# Patient Record
Sex: Female | Born: 1935 | Race: White | Hispanic: No | Marital: Married | Smoking: Former smoker
Health system: Southern US, Community
[De-identification: ages and names within clinical notes are randomized; demographics above are authoritative.]

## PROBLEM LIST (undated history)

## (undated) DIAGNOSIS — U071 COVID-19: Secondary | ICD-10-CM

## (undated) DIAGNOSIS — N189 Chronic kidney disease, unspecified: Secondary | ICD-10-CM

## (undated) DIAGNOSIS — J189 Pneumonia, unspecified organism: Secondary | ICD-10-CM

## (undated) DIAGNOSIS — M1990 Unspecified osteoarthritis, unspecified site: Secondary | ICD-10-CM

## (undated) DIAGNOSIS — T8859XA Other complications of anesthesia, initial encounter: Secondary | ICD-10-CM

## (undated) DIAGNOSIS — C4492 Squamous cell carcinoma of skin, unspecified: Secondary | ICD-10-CM

## (undated) DIAGNOSIS — H35039 Hypertensive retinopathy, unspecified eye: Secondary | ICD-10-CM

## (undated) DIAGNOSIS — C4491 Basal cell carcinoma of skin, unspecified: Secondary | ICD-10-CM

## (undated) DIAGNOSIS — R0989 Other specified symptoms and signs involving the circulatory and respiratory systems: Secondary | ICD-10-CM

## (undated) DIAGNOSIS — E785 Hyperlipidemia, unspecified: Secondary | ICD-10-CM

## (undated) DIAGNOSIS — N183 Chronic kidney disease, stage 3 unspecified: Secondary | ICD-10-CM

## (undated) DIAGNOSIS — I129 Hypertensive chronic kidney disease with stage 1 through stage 4 chronic kidney disease, or unspecified chronic kidney disease: Secondary | ICD-10-CM

## (undated) DIAGNOSIS — D649 Anemia, unspecified: Secondary | ICD-10-CM

## (undated) DIAGNOSIS — E119 Type 2 diabetes mellitus without complications: Secondary | ICD-10-CM

## (undated) DIAGNOSIS — H3530 Unspecified macular degeneration: Secondary | ICD-10-CM

## (undated) DIAGNOSIS — I10 Essential (primary) hypertension: Secondary | ICD-10-CM

## (undated) DIAGNOSIS — E1122 Type 2 diabetes mellitus with diabetic chronic kidney disease: Secondary | ICD-10-CM

## (undated) DIAGNOSIS — E1140 Type 2 diabetes mellitus with diabetic neuropathy, unspecified: Secondary | ICD-10-CM

## (undated) HISTORY — PX: CHOLECYSTECTOMY: SHX55

## (undated) HISTORY — DX: Type 2 diabetes mellitus without complications: E11.9

## (undated) HISTORY — PX: SPINE SURGERY: SHX786

## (undated) HISTORY — DX: Unspecified macular degeneration: H35.30

## (undated) HISTORY — PX: ABDOMINAL HYSTERECTOMY: SHX81

## (undated) HISTORY — DX: Essential (primary) hypertension: I10

## (undated) HISTORY — DX: Hypertensive retinopathy, unspecified eye: H35.039

## (undated) HISTORY — PX: TONSILLECTOMY: SUR1361

## (undated) HISTORY — PX: CATARACT EXTRACTION: SUR2

## (undated) HISTORY — PX: HERNIA REPAIR: SHX51

## (undated) HISTORY — PX: EYE SURGERY: SHX253

---
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
## 1998-05-07 ENCOUNTER — Ambulatory Visit (HOSPITAL_COMMUNITY): Admission: RE | Admit: 1998-05-07 | Discharge: 1998-05-07 | Payer: Self-pay | Admitting: *Deleted

## 1998-12-12 ENCOUNTER — Encounter: Admission: RE | Admit: 1998-12-12 | Discharge: 1998-12-12 | Payer: Self-pay | Admitting: *Deleted

## 2000-09-01 ENCOUNTER — Encounter: Admission: RE | Admit: 2000-09-01 | Discharge: 2000-09-01 | Payer: Self-pay | Admitting: *Deleted

## 2000-10-11 ENCOUNTER — Encounter (INDEPENDENT_AMBULATORY_CARE_PROVIDER_SITE_OTHER): Payer: Self-pay | Admitting: Specialist

## 2000-10-11 ENCOUNTER — Ambulatory Visit (HOSPITAL_COMMUNITY): Admission: RE | Admit: 2000-10-11 | Discharge: 2000-10-11 | Payer: Self-pay | Admitting: *Deleted

## 2000-12-13 ENCOUNTER — Ambulatory Visit (HOSPITAL_COMMUNITY): Admission: RE | Admit: 2000-12-13 | Discharge: 2000-12-13 | Payer: Self-pay | Admitting: *Deleted

## 2001-11-28 ENCOUNTER — Encounter: Admission: RE | Admit: 2001-11-28 | Discharge: 2001-11-28 | Payer: Self-pay | Admitting: Internal Medicine

## 2001-11-28 ENCOUNTER — Encounter: Payer: Self-pay | Admitting: Internal Medicine

## 2002-03-28 ENCOUNTER — Encounter (INDEPENDENT_AMBULATORY_CARE_PROVIDER_SITE_OTHER): Payer: Self-pay | Admitting: *Deleted

## 2002-03-28 ENCOUNTER — Ambulatory Visit (HOSPITAL_COMMUNITY): Admission: RE | Admit: 2002-03-28 | Discharge: 2002-03-28 | Payer: Self-pay | Admitting: *Deleted

## 2002-12-31 ENCOUNTER — Encounter: Admission: RE | Admit: 2002-12-31 | Discharge: 2002-12-31 | Payer: Self-pay | Admitting: Internal Medicine

## 2003-02-07 ENCOUNTER — Ambulatory Visit (HOSPITAL_COMMUNITY): Admission: RE | Admit: 2003-02-07 | Discharge: 2003-02-07 | Payer: Self-pay | Admitting: *Deleted

## 2003-02-28 ENCOUNTER — Inpatient Hospital Stay (HOSPITAL_COMMUNITY): Admission: EM | Admit: 2003-02-28 | Discharge: 2003-03-06 | Payer: Self-pay | Admitting: Emergency Medicine

## 2003-03-01 ENCOUNTER — Encounter (INDEPENDENT_AMBULATORY_CARE_PROVIDER_SITE_OTHER): Payer: Self-pay | Admitting: *Deleted

## 2004-02-06 ENCOUNTER — Encounter: Admission: RE | Admit: 2004-02-06 | Discharge: 2004-02-06 | Payer: Self-pay | Admitting: Internal Medicine

## 2004-08-27 ENCOUNTER — Ambulatory Visit: Payer: Self-pay | Admitting: Hematology and Oncology

## 2004-10-29 ENCOUNTER — Ambulatory Visit: Payer: Self-pay | Admitting: Hematology and Oncology

## 2004-12-15 ENCOUNTER — Ambulatory Visit: Payer: Self-pay | Admitting: Hematology and Oncology

## 2005-01-29 ENCOUNTER — Ambulatory Visit: Payer: Self-pay | Admitting: Hematology and Oncology

## 2005-03-18 ENCOUNTER — Ambulatory Visit: Payer: Self-pay | Admitting: Hematology and Oncology

## 2005-03-23 ENCOUNTER — Encounter (HOSPITAL_COMMUNITY): Admission: RE | Admit: 2005-03-23 | Discharge: 2005-06-21 | Payer: Self-pay | Admitting: Internal Medicine

## 2005-04-21 ENCOUNTER — Encounter (INDEPENDENT_AMBULATORY_CARE_PROVIDER_SITE_OTHER): Payer: Self-pay | Admitting: *Deleted

## 2005-04-21 ENCOUNTER — Ambulatory Visit (HOSPITAL_COMMUNITY): Admission: RE | Admit: 2005-04-21 | Discharge: 2005-04-22 | Payer: Self-pay | Admitting: General Surgery

## 2005-05-10 ENCOUNTER — Encounter: Admission: RE | Admit: 2005-05-10 | Discharge: 2005-05-10 | Payer: Self-pay | Admitting: Internal Medicine

## 2005-05-18 ENCOUNTER — Ambulatory Visit: Payer: Self-pay | Admitting: Hematology and Oncology

## 2006-09-20 ENCOUNTER — Encounter: Admission: RE | Admit: 2006-09-20 | Discharge: 2006-09-20 | Payer: Self-pay | Admitting: Obstetrics and Gynecology

## 2007-12-19 ENCOUNTER — Encounter: Admission: RE | Admit: 2007-12-19 | Discharge: 2007-12-19 | Payer: Self-pay | Admitting: Internal Medicine

## 2009-04-11 ENCOUNTER — Encounter: Admission: RE | Admit: 2009-04-11 | Discharge: 2009-04-11 | Payer: Self-pay | Admitting: Internal Medicine

## 2009-06-02 ENCOUNTER — Ambulatory Visit (HOSPITAL_COMMUNITY): Admission: RE | Admit: 2009-06-02 | Discharge: 2009-06-03 | Payer: Self-pay | Admitting: General Surgery

## 2010-04-14 LAB — CBC
HCT: 33.9 % — ABNORMAL LOW (ref 36.0–46.0)
Hemoglobin: 11.5 g/dL — ABNORMAL LOW (ref 12.0–15.0)
MCV: 91.3 fL (ref 78.0–100.0)
Platelets: 276 10*3/uL (ref 150–400)
RBC: 3.71 MIL/uL — ABNORMAL LOW (ref 3.87–5.11)
RDW: 15.1 % (ref 11.5–15.5)
WBC: 6.3 10*3/uL (ref 4.0–10.5)

## 2010-04-14 LAB — BASIC METABOLIC PANEL
BUN: 15 mg/dL (ref 6–23)
CO2: 28 mEq/L (ref 19–32)
Calcium: 9.4 mg/dL (ref 8.4–10.5)
Chloride: 98 mEq/L (ref 96–112)

## 2010-04-14 LAB — GLUCOSE, CAPILLARY
Glucose-Capillary: 102 mg/dL — ABNORMAL HIGH (ref 70–99)
Glucose-Capillary: 117 mg/dL — ABNORMAL HIGH (ref 70–99)
Glucose-Capillary: 119 mg/dL — ABNORMAL HIGH (ref 70–99)
Glucose-Capillary: 140 mg/dL — ABNORMAL HIGH (ref 70–99)

## 2010-06-12 NOTE — Discharge Summary (Signed)
Sue [name], Sue [name]               ACCOUNT NO.:  [id number]   MEDICAL RECORD NO.:  [id number]          PATIENT TYPE:  OIB   LOCATION:  5733                         FACILITY:  [facility]   PHYSICIAN:  Gabrielle Dare. Janee Morn, M.D.DATE OF BIRTH:  12/25/1932   DATE OF ADMISSION:  04/21/2005  DATE OF DISCHARGE:  04/22/2005                                 DISCHARGE SUMMARY   DISCHARGE DIAGNOSES:  1.  Ventral incisional hernia.  2.  Status post ventral incisional hernia repair.   HISTORY OF PRESENT ILLNESS:  The patient is a 77 year old female who  developed ventral incisional hernia after doing some heavy lifting after a  laparoscopic cholecystectomy.  The hernia enlarged and became more  symptomatic.  She presented for elective repair.   HOSPITAL COURSE:  The patient underwent an uncomplicated repair of a ventral  incisional hernia.  No mesh was required.  Postoperatively, she remained  afebrile and hemodynamically stable.  Diabetes was well managed.  She  tolerated gradual advancement of her diet and is discharged home  postoperative day one.   DISCHARGE MEDICATIONS:  She is to continue her home medications.  In  addition, Percocet 5/325 1-2 p.o. q6h. p.r.n. pain and follow up is next  Thursday as a nurse visit at my office for removal of her staples.  Her  activity is no lifting.      Gabrielle Dare Janee Morn, M.D.  Electronically Signed     BET/MEDQ  D:  04/22/2005  T:  04/22/2005  Job:  [id number]

## 2010-06-12 NOTE — Consult Note (Signed)
NAME:  Sue [name], Sue [name] NO.:  [id number]   MEDICAL RECORD NO.:  [id number]                   PATIENT TYPE:  INP   LOCATION:  2908                                 FACILITY:  [facility]   PHYSICIAN:  Gabrielle Dare. Janee Morn, M.D.             DATE OF BIRTH:  1932/07/15   DATE OF CONSULTATION:  03/01/2003  DATE OF DISCHARGE:                                   CONSULTATION   REASON FOR CONSULTATION:  Acute cholecystitis.   HISTORY OF PRESENT ILLNESS:  The patient is a 77 year old white female with  a history of hypertension and non-insulin dependent diabetes mellitus who is  a patient of Dr. Juleen [name]. She complains of a three to four day history of  abdominal pain. I am asked to see her to evaluate this by Dr. Susy [name].  The patient had some nausea and vomiting on Monday night, but has had none  since. She has had some persistent pain in her right upper quadrant. She saw  Dr. Juleen [name] who ordered an outpatient CT scan of the abdomen and pelvis. The  reportedly shows acute cholecystitis. She was going to be transferred her to  this facility. The patient is still having some pain in the right upper  quadrant, though she claims it is significantly less. She has no other  complaints at this time. She has not had a significant history of biliary  colic in the past.   PAST MEDICAL HISTORY:  Non-insulin dependent diabetes mellitus,  hypertension, and colon polyps.   PAST SURGICAL HISTORY:  Hysterectomy, tonsillectomy, and back surgery, which  was done by Dr. Trey [name]. She has had colonoscopy with polypectomy by Dr.  Virginia [name].   SOCIAL HISTORY:  She does not drink alcohol. She does not smoke.   MEDICATIONS:  1. Nortriptyline 25 mg p.o. b.i.d.  2. Aciphex 20 mg daily.  3. Aspirin 81 mg daily.  4. Vitamin B12.  5. Fish oil.  6. Lescol daily.  7. Avandamet 1000 mg p.o. b.i.d.  8. Glipizide 10 mg daily.  9. Enalapril 10 mg daily.  10.      Atenolol 25 mg daily.  11.       Hydrochlorothiazide 25 mg daily.   ALLERGIES:  No known drug allergies.   REVIEW OF SYSTEMS:  GENERAL:  She feels weak. CARDIAC:  Negative. PULMONARY:  Negative. GI:  See the history of present illness. GU:  Negative. GYN:  Status post hysterectomy, but she does have her ovaries. The remainder of  the review of systems is negative.   PHYSICAL EXAMINATION:  VITAL SIGNS:  Temperature 98.7, blood pressure  101/48, pulse rate 82, respirations 26, saturations 97% on room air.  GENERAL:  She is awake and alert.  HEENT:  Pupils are equal and reactive. Sclerae is nonicteric.  NECK:  Supple. There are no thyroid masses. There is no cervical or  supraclavicular adenopathy.  LUNGS:  Clear to  auscultation with good respiratory excursion.  HEART:  Regular rate and rhythm. PMI is palpable in the left chest. Distal  pulses are 1-2+ and equal bilaterally.  ABDOMEN:  Minimally distended, soft, with tenderness to moderate deep  palpation in the right upper quadrant. No masses are palpable. Bowel sounds  are hypoactive.  SKIN:  Warm, dry, and intact.   LABORATORY DATA:  White blood cell count 15.4, hemoglobin 9.2, platelets  237, sodium 129, potassium 2.6, carbon dioxide 26, glucose 107. BUN 35,  creatinine 2. AST 14, ALP 13, alkaline phosphatase 65, bilirubin 0.8.  Urinalysis is unremarkable. CT scan is en route.   IMPRESSION:  1. Acute cholecystitis.  2. Hypovolemia.  3. Hyponatremia.   PLAN:  The patient was admitted to the medical service by Dr. Garnette [name]. IV  fluid resuscitation and IV antibiotics are recommended. We will recheck a  BMET in the a.m. and possible do a cholecystectomy in the morning if her  creatinine is down and she is medically cleared at that time. The procedure,  risks, and benefits were discussed in detail with the patient and her  daughter including the risks of converting to open procedure, bleeding,  infection, and common bile duct injury. Questions were answered and  they  express understanding. I feel the patient might not be ready medically to go  to surgery tomorrow and we discussed this as well. We will proceed with  getting her gallbladder out when it is safe to do so.                                               Gabrielle Dare Janee Morn, M.D.    BET/MEDQ  D:  03/01/2003  T:  03/01/2003  Job:  [id number]

## 2010-06-12 NOTE — Op Note (Signed)
NAME:  Sue [name], Sue [name] NO.:  [id number]   MEDICAL RECORD NO.:  [id number]                   PATIENT TYPE:  INP   LOCATION:  2908                                 FACILITY:  [facility]   PHYSICIAN:  Gabrielle Dare. Janee Morn, M.D.             DATE OF BIRTH:  12/10/1932   DATE OF PROCEDURE:  03/01/2003  DATE OF DISCHARGE:                                 OPERATIVE REPORT   PREOPERATIVE DIAGNOSIS:  Acute cholecystitis.   POSTOPERATIVE DIAGNOSIS:  Gangrenous cholecystitis with empyema of the  gallbladder.   PROCEDURE:  Laparoscopic cholecystectomy.   SURGEON:  Gabrielle Dare. Janee Morn, M.D.   ASSISTANT:  Vikki Ports, M.D.   ANESTHESIA:  General.   HISTORY OF PRESENT ILLNESS:  The patient is a 77 year old female with non  insulin dependent diabetes mellitus, who was admitted to Dr. _______ service  with a 3 to  4 day history of abdominal pain. She had a workup as an  outpatient per Dr. Juleen China which included a CT scan which showed acute  cholecystitis. On admission in the middle of the night last night, her white  blood cell count was elevated to 15.4, and her creatinine  was elevated to  2.0. She was admitted. Intravenous antibiotics and IV fluid resuscitation  were instituted with improvement in her laboratory  studies  and she  presents for cholecystectomy.   DESCRIPTION OF PROCEDURE:  Informed consent was obtained and the patient  remained on Unasyn intravenously. She was brought to the operating room and  general anesthesia was administered. Her abdomen  was prepped and draped in  a sterile fashion.   A supraumbical  midline incision was made. The subcutaneous tissues were  dissected down revealing  the anterior fascia which was divided sharply. The  peritoneal cavity was entered under direct vision without difficulty. A 0  Vicryl pursestring suture was placed around the fascial opening and the  Hasson trocar was inserted in the abdomen. The abdomen was  insufflated with  CO2 in the usual standard fashion.   Under direct vision, an 11-mm epigastric  and two 5-mm lateral ports were  placed. Exploration revealed  dense adhesions up to the gallbladder, and an  extremely inflamed gallbladder  with patchy necrosis on the anterior wall. A  Navjot suction irrigator was used to aspirate first to facilitate grasping.  Aspiration revealed some clear  bile mixed with purulent fluid consistent  with an empyema.   Once the aspiration was completed, the dome of the gallbladder was retracted  superomedially. Blunt dissection  was used to clear the transverse colon and  omentum  which was quite adherent away. This was undertaken gradually, then  it slowly revealed the  infundibulum, followed  by  the cystic duct.   We began our dissection laterally and progressed medially, revealing  the  cystic duct. We continued the dissection along each edge of the gallbladder  to facilitate  a large window between  the infundibulum, the cystic duct and  the liver. Once that was accomplished with good visualization, a clip was  placed on the infundibulo cystic duct junction. Please note that the  gallbladder  was incredibly friable with several necrotic areas and was  literally falling apart.   We made a small  nick in the cystic duct and attempted to insert a Reddick  cholangiogram  catheter. The cystic duct was too friable  and it was not  possible to admit the cholangiogram  catheter. The patient's liver function  tests had been OK, so after several attempts, a cholangiogram  was  abandoned, as it was not technically feasible.   Subsequently our window was rechecked and 3 clips were placed proximally  on  the cystic duct. Subsequently further  dissection revealed the cystic artery  which was clipped 2 times proximally, once distally and divided. The  gallbladder  was then gradually  taken off the liver bed using Bovie  cautery. It remained  friable and  gangrenous, and several areas of the liver  bed were cauterized to get good hemostasis as we removed it. It was  subsequently placed in an EndoCatch bag and taken out of the abdomen via the  supraumbical  port site.   Subsequently copious irrigation was used. We reinspected the liver bed.  Several areas were cauterized to give good hemostasis. There was a good  amount of oozing throughout the case and clots were evacuated. A few more  areas were touched up with the Bovie on the liver bed until hemostasis was  achieved.   Once that was accomplished, then the irrigation fluid returned clear. A  piece of Surgicel was cut in half and both pieces were placed down in the  liver bed. Subsequently a 69 [place] Blake drain was inserted via the lateral  of the 2 lateral  port sites and placed in the gallbladder bed under the  gallbladder  fossa.   Subsequently the trocars were removed under direct vision. The  pneumoperitoneum was released. The Hasson trocar was taken out of the  abdomen. The fascial defect was closed by tying the 0 Vicryl pursestring  suture. All 4 wounds were copiously irrigated and 0.25% Marcaine with  epinephrine had been used at each site for postoperative pain relief.   The skin of each incision was closed with a running 4-0 Vicryl subcuticular  stitch. Benzoin and Steri-Strips and sterile dressings were applied. All  sponge, instrument and needle counts were correct.   The patient tolerated the procedure without apparent complications. She was  taken to the recovery room in stable condition.                                               Gabrielle Dare Janee Morn, M.D.    BET/MEDQ  D:  03/01/2003  T:  03/02/2003  Job:  [id number]

## 2010-06-12 NOTE — Discharge Summary (Signed)
NAME:  Sue [name], Sue [name]                         ACCOUNT NO.:  [id number]   MEDICAL RECORD NO.:  [id number]                   PATIENT TYPE:  INP   LOCATION:  4739                                 FACILITY:  [facility]   PHYSICIAN:  Ara D. Tammi Klippel, M.D.                DATE OF BIRTH:  Oct 25, 1932   DATE OF ADMISSION:  02/28/2003  DATE OF DISCHARGE:                                 DISCHARGE SUMMARY   PRIMARY CARE PHYSICIAN:  [name], M.D.   CONSULTING SURGEON:  Gabrielle Dare. Janee Morn, M.D.   FINAL DIAGNOSES:  1. Gangrenous cholecystitis with empyema of the gallbladder.  2. Diabetes mellitus type 2 with neuropathy.  3. Hypertension.  4. Obesity.  5. Sigmoid diverticulosis.  6. Gastroesophageal reflux disease.  7. Status post hysterectomy.   FINAL PROCEDURES:  1. Chest x-ray performed March 01, 2003 showing cardiac enlargement and     vascular congestion.  2. Chest x-ray performed March 03, 2003 showing decreased lung volumes and     worsening bibasilar atelectasis.  3. Laparoscopic cholecystectomy performed March 01, 2003.   PERTINENT LABORATORY DATA AND OTHER TEST RESULTS:  White blood cells 10.7,  H&H 8.0/23.5, with MCV of 85.1, platelet count of 349.  Sodium 138,  potassium 3.7, chloride 104, carbon dioxide 27, BUN 20, creatinine 1.0,  glucose 158, calcium 8.1, magnesium 1.7.  Hemoglobin A1c 7.8.  TSH 0.512.  Total iron was less than 10, vitamin B12 was 908, folate 585, ferritin 177,  TIBC and percent saturation were unable to be calculated.   SUMMARY OF HOSPITAL COURSE BY SYSTEMS:  The is a very pleasant 77 year old  white female with past medical history as listed above who was admitted for  emergent treatment of cholecystitis.   #1 - NEUROPSYCHIATRIC.  There were no signs or symptoms that may suggest  __________ or CVA.  There were no in-hospital episodes of acute mania or  psychosis.  The patient appeared euthymic throughout the admission.   #2 - PULMONARY.  The  patient was placed on supplemental oxygen in the  beginning with aggressive pulmonary toilet postoperatively.  There were no  episodes of hypoxemia, paroxysmal nocturnal dyspnea, or orthopnea.   #3 - CARDIOVASCULAR.  Initially the patient was hypotensive following her  surgery and required some moderate fluid support.  However, this soon  cleared up postoperatively and eventually the patient was started back on  all of her antihypertensive medications.  There were no symptoms of chest  pain or cardiac ischemia.  There were no signs of beta blockade withdrawal.   #4 - RENAL.  Nephrotoxins were avoided and medications were renally dosed  when appropriate.   #5 - GASTROINTESTINAL.  The patient underwent emergent laparoscopic  cholecystectomy as described above which showed a gangrenous gallbladder  with empyema.  She was closely followed by Dr. Janee Morn and was placed on  ampicillin/sulbactam for possible cholangitis.  An intraoperative  cholangiogram was  attempted.  However, because of the patient's anatomy this  was unable to be performed; however, there were no signs or symptoms of  choledocholithiasis or a retained stone.  Postoperatively the patient  improved on a daily basis and was tolerating an ADA diet without any  difficulty.   #6 - FLUIDS, ELECTROLYTES, NUTRITION.  The patient was initially volume  resuscitated after her surgery but eventually her IV fluids were medlocked.  Her potassium and magnesium were kept within normal limits and when she was  able to tolerate food she was placed on an ADA diet.   #7 - INFECTIOUS DISEASE.  The patient was placed on ampicillin/sulbactam for  coverage given the degree of her cholecystitis.  The patient did have mild  fevers; however, there was no marked leukocytosis and the patient clinically  improved on a daily basis.  On the day prior to discharge she had been  changed over to p.o. amoxicillin/clavulanic acid without any adverse  change.  She will be discharged home on this for approximately 1 week.   #8 - HEMATOLOGY/ONCOLOGY.  The patient does have a normocytic anemia.  Her  total iron was low at less than 10; however, her ferritin was within normal  limits.  Once the patient is over her acute phase it may be judicious to  recheck the patient's iron studies.  She has had a hysterectomy and has had  frequent colonoscopies so blood loss does not seem to be an issue.   #9 - ENDOCRINE.  The patient does have a history of diabetes with  neuropathy.  When she was able to tolerate her medications again she was  placed back on full dose of all of her medications including her glipizide  which had been increased to the maximum dose.  While the patient was in  house she was placed on sliding scale insulin which she needed on a regular  basis.   #10 - PROPHYLAXIS.  The patient was full p.o. for GI prophylaxis and has  SCDs when she was nonambulatory, and then was ambulated for her DVT  prophylaxis.   #11 - DISPOSITION.  Greater than 30 minutes was spent discussing with the  patient during discharge regarding her need for diet, exercise, and medical  adherence.  She is being discharged home in the care of her daughter in much  improved condition.  She is a Full Code.   DISCHARGE MEDICATIONS:  1. Amoxicillin/clavulanic acid 875 mg p.o. b.i.d.  2. Atenolol 25 mg p.o. daily.  3. Enalapril 10 mg p.o. daily.  4. Fluvastatin XL 80 mg p.o. daily.  5. Glipizide XL 20 mg p.o. daily (increase).  6. Metformin/rosiglitazone 1000/4 mg b.i.d.  7. Aspirin 81 mg p.o. daily.  8. Hydrochlorothiazide 25 mg p.o. daily.  9. Percocet 5/325 one to two tablets p.o. q.6h. p.r.n. pain #28 dispensed.   DISCHARGE INSTRUCTIONS:  1. The patient is to take her medications as prescribed.  2. She is to follow up with Dr. Nicholos Johns at her regularly-scheduled     appointment. 3. She is to call Dr. Janee Morn in 3 weeks to schedule a follow-up      appointment.  The number is given to the patient.  4. She is to return if she feels worse in any way.  Ara D. Tammi Klippel, M.D.    ADM/MEDQ  D:  03/06/2003  T:  03/06/2003  Job:  [id number]   cc:   [name], M.D.  [address]  [place]  [place] [address]  Fax: [phone]   Gabrielle Dare. Janee Morn, M.D.  [facility]  [address] [place], [place] [address]  Fax: [phone]

## 2010-06-12 NOTE — Op Note (Signed)
NAMENUVIA, HILEMAN               ACCOUNT NO.:  [id number]   MEDICAL RECORD NO.:  [id number]          PATIENT TYPE:  AMB   LOCATION:  SDS                          FACILITY:  [facility]   PHYSICIAN:  Gabrielle Dare. Janee Morn, M.D.DATE OF BIRTH:  1932-10-13   DATE OF PROCEDURE:  04/21/2005  DATE OF DISCHARGE:                                 OPERATIVE REPORT   PREOPERATIVE DIAGNOSIS:  Ventral incisional hernia.   POSTOPERATIVE DIAGNOSIS:  Ventral incisional hernia.   PROCEDURES:  Repair ventral incisional hernia.   SURGEON:  Violeta Gelinas, MD.   ANESTHESIA:  General.   HISTORY OF PRESENT ILLNESS:  The patient is a 77 year old female who  underwent laparoscopic cholecystectomy for gangrenous cholecystitis on  March 01, 2003.  Postoperatively she had to do a lot of heavy lifting of  her husband's oxygen equipment.  She developed a small incisional hernia at  her  supraumbilical incision.  This was initially asymptomatic but over the  past several months has grown larger and has been bothering her.  She  presents today for elective repair.   PROCEDURE IN DETAIL:  Informed consent was obtained.  The patient was  identified.  She received intravenous antibiotics.  She was brought to the  operating room.  General anesthesia was administered.  Her abdomen was  prepped and draped in sterile fashion.  An upper midline incision was made  over her fascial defect.  Subcutaneous tissues were dissected down and we  entered the hernia gradually.  There was omentum herniated out into the sac.  This was reduced back into the abdomen.  The fascia was circumferentially  dissected and was some adhesions were freed up on the inside from the  omentum.  There was no bowel stuck in or involved in the hernia.  Once the  fascia was circumferentially cleaned off, the hernia sac was excised and  sent to pathology.  The fascial defect was then closed with a series of  interrupted #1 Novofil sutures in a  figure-of-eight fashion.  Fascia came  together nicely.  A couple of extra interrupted sutures were placed  completing the repair.  Subcutaneous tissues were copiously irrigated.  0.25% Marcaine with epinephrine was injected in the musculature and  subcutaneous tissues for postoperative pain relief.  Subcutaneous tissues  were approximated with interrupted 2-0 Vicryl sutures and after  insuring excellent hemostasis, the skin was closed with staples.  Sponge,  needle and instrument counts were correct.  A sterile dressing was applied.  The patient tolerated the procedure well without apparent complication and  was taken to recovery in stable condition.      Gabrielle Dare Janee Morn, M.D.  Electronically Signed     BET/MEDQ  D:  04/21/2005  T:  04/22/2005  Job:  [id number]   cc:   [name], M.D.  Fax: [phone]

## 2010-06-12 NOTE — Procedures (Signed)
[facility]  Patient:    Sue [name], Sue [name] Visit Number: [id number] MRN: [id number]          Service Type: END Location: ENDO Attending Physician:  Sabino Gasser Dictated by:   Sabino Gasser, M.D. Admit Date:  10/11/2000                             Procedure Report  PROCEDURE:  Colonoscopy.  INDICATIONS:  Colon polyps.  ANESTHESIA:  Additional Versed 3 mg.  DESCRIPTION OF PROCEDURE:  With the patient mildly sedated in the left lateral decubitus position, the Olympus videoscopic colonoscope was inserted in the rectum, passed under direct vision to the cecum, identified by ileocecal valve and appendiceal orifice, both of which were photographed.  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  Again the scope was then withdrawn all the way to the rectum, which showed a polyp, which was again photographed and removed using hot biopsy forceps technique, again a setting of 20-20 blended current.  Again the scope was then pulled back to the rectum, placed in retroflexion to view the anal canal from above.  Hemorrhoids were seen and photographed.  The endoscope was straightened and withdrawn. The patients vital signs and pulse oximetry remained stable.  The patient tolerated the procedure well without apparent complications.  FINDINGS:  Diverticulosis of colon seen scattered throughout the colon, internal hemorrhoids, polyps seen in the cecum and rectum.  PLAN:  Await biopsy report.  The patient will call me for results and follow up with me as an outpatient.  See endoscopy note for further details. Dictated by:   Sabino Gasser, M.D. Attending Physician:  Sabino Gasser DD:  10/11/00 TD:   10/11/00 Job: [id number]

## 2010-06-12 NOTE — Procedures (Signed)
[place]  Patient:    DESHANA, ROMINGER Visit Number: [id number] MRN: [id number]          Service Type: END Location: ENDO Attending Physician:  Sabino Gasser Dictated by:   Sabino Gasser, M.D. Proc. Date: 10/11/00 Admit Date:  10/11/2000                             Procedure Report  PROCEDURE PERFORMED:  Upper endoscopy.  ENDOSCOPIST:  Sabino Gasser, M.D.  INDICATIONS FOR PROCEDURE:  Gastroesophageal reflux disease.  ANESTHESIA:  Demerol 70 mg, Versed 7 mg.  DESCRIPTION OF PROCEDURE:  With the patient mildly sedated in the left lateral decubitus position, the Olympus video endoscope was inserted in the mouth and passed under direct vision through the esophagus.  Two polyps were seen in the distal esophagus and they were photographed and biopsied.  We entered into the stomach.  The fundus, body, antrum, duodenal bulb and second portion of the duodenum were all visualized.  Photographs were taken.  From this point, the endoscope was slowly withdrawn taking circumferential views of the entire duodenal mucosa until the endoscope had been pulled back into the stomach and placed on retroflexion to view the stomach from below and a hiatal hernia was seen.  The endoscope was then straightened and withdrawn taking circumferential views of the remaining gastric and subsequently esophageal mucosa.  Patients vital signs and pulse oximeter remained stable.  The patient tolerated the procedure well without apparent complications.  FINDINGS:  Polyps of distal esophagus question of Barretts.  Await biopsy report.  Patient will call me for results and follow up with me as an outpatient.  Proceed to colonoscopy as planned.  PLAN: Dictated by:   Sabino Gasser, M.D. Attending Physician:  Sabino Gasser DD:  10/11/00 TD:  10/11/00 Job: [id number]

## 2010-06-12 NOTE — Op Note (Signed)
   NAME:  Sue [name], BROSHEARS NO.:  [id number]   MEDICAL RECORD NO.:  [id number]                   PATIENT TYPE:  AMB   LOCATION:  ENDO                                 FACILITY:  [facility]   PHYSICIAN:  Georgiana Spinner, M.D.                 DATE OF BIRTH:  January 14, 1933   DATE OF PROCEDURE:  03/28/2002  DATE OF DISCHARGE:                                 OPERATIVE REPORT   PROCEDURE:  Upper endoscopy.   INDICATIONS:  Follow-up of previous inflammatory changes.   ANESTHESIA:  Demerol 60 mg, Versed 7 mg.   DESCRIPTION OF PROCEDURE:  With the patient mildly sedated in the left  lateral decubitus position, the Olympus videoscopic endoscope inserted in  the mouth, passed under direct vision through the esophagus, which appeared  normal until we reached the distal esophagus, and there was some  irregularity of the Z-line but no mass seen.  This was photographed and then  biopsies taken along the border of the GE junction.  The endoscope was  advanced into the stomach.  The fundus, body, antrum, duodenal bulb, second  portion of duodenum all appeared normal.  From this point the endoscope was  slowly withdrawn, taking circumferential views of the entire duodenal mucosa  until the endoscope had been pulled back into the stomach, placed in  retroflexion to view the stomach from below.  The endoscope was straightened  and withdrawn, taking circumferential views of the remaining gastric and  esophageal mucosa.  The patient's vital signs and pulse oximetry remained  stable.  The patient tolerated the procedure well without apparent  complications.   FINDINGS:  Essentially unremarkable examination other than minimal  inflammatory changes of the gastroesophageal junction.   PLAN:  Await biopsy report.  The patient will call me for results and follow  up with me as an outpatient.                                               Georgiana Spinner, M.D.    GMO/MEDQ  D:   03/28/2002  T:  03/28/2002  Job:  [id number]

## 2010-06-12 NOTE — H&P (Signed)
NAME:  Sue [name], Sue NO.:  [name]   Sue RECORD NO.:  [name]                   PATIENT TYPE:  EMS   LOCATION:  MAJO                                 FACILITY:  [facility]   PHYSICIAN:  Hettie Holstein, D.O.                 DATE OF BIRTH:  1932-08-20   DATE OF ADMISSION:  02/28/2003  DATE OF DISCHARGE:                                HISTORY & PHYSICAL   PRIMARY CARE PHYSICIAN:  Dr. Nicholos Johns and Dr. Juleen China.   GASTROENTEROLOGIST:  Dr. Virginia Rochester.   CHIEF COMPLAINT:  Right upper quadrant abdominal pain and decreased  appetite.   HISTORY OF PRESENT ILLNESS:  This is a pleasant 77 year old diabetic  Caucasian female with a history of hypertension, who presents today to the  emergency department with right upper quadrant abdominal pain and a  radiology imaging report from [facility] that was called after the patient was seen  in Dr. Marylen Ponto office today with the findings of advanced cholecystitis  without evidence of perforation or emphysema.  Sue [name] reported that  about four days ago, she developed some nausea, an episode of vomiting, and  right upper quadrant pain that initially started as diffuse abdominal pain  but moved into her right lower quadrant and has increased in the right upper  quadrant.  She states that the pain was sharp.  She has had decreased  appetite.  No fevers.  She has had some chills.  Otherwise, she has had no  other associated complaint with the exception of decreased appetite.  In the  emergency department, she was found to be hypotension with leukocytosis and  right upper quadrant pain.  Imaging studies as conveyed by Dr. Suzie Portela at  [facility] revealing advanced cholecystitis with inflammation in  the gallbladder.  At this time, the emergency department is informed that  Dr. Violeta Gelinas, who requested that she be evaluated by medicine as well.  She had a creatinine of 2.0.  She responded to IV fluids in the emergency  department with increase in her blood pressure to 101/46.   PAST Sue HISTORY:  Significant for hypertension.  She has no known  coronary artery disease.  She has had a stress test over this past year.  She states that she was told this was good.  A history of diabetes mellitus  with peripheral neuropathy.  She has no knowledge of renal impairment with  no history of MI.   PAST SURGICAL HISTORY:  She has had a hysterectomy without oophorectomy as  well as back surgery and colonoscopy most of the times by Dr. Virginia Rochester in the  past 15 years.   MEDICATIONS:  1. Glipizide 10 mg p.o. q.d.  2. Atenolol 25 mg p.o. q.d.  3. Hydrochlorothiazide 25 mg p.o. q.d.  4. Prilosec over the counter q.d.  5. Neurontin dose unknown.  6. Aspirin 81 mg p.o. q.d.  7. She  is unsure whether she is taking an ACE inhibitor or not or an ARB.     Awaiting further from family.   ALLERGIES:  No known drug allergies.   SOCIAL HISTORY:  She denies tobacco or alcohol.  She formerly worked in day  care.  She is G4, T4.  She lives with her husband, who also has some Sue  problems.   FAMILY HISTORY:  Significant for a father who died at the age of 5 with  heart problems.  Mother died at age 20.   REVIEW OF SYSTEMS:  As above.  Some nausea and vomiting that has resolved at  this point.  Chills.  No fevers.  No night sweats.  Abdominal pain, as  reported above.  No hematochezia.  No constipation.  No hematemesis.  No  dysuria.  No lower extremity swelling or calf pain.   PHYSICAL EXAMINATION:  VITAL SIGNS:  Blood pressure 101/46, heart rate 82,  temp 98.7, respirations 18.  O2 saturation 97% on room air.  GENERAL:  A pleasant Caucasian female in no apparent distress.  Nontoxic-  appearing.  HEENT:  Normocephalic and atraumatic.  Extraocular muscles are intact.  Oral  mucosa is pink and moist.  NECK:  Supple.  Nontender.  No palpable thyromegaly.  LUNGS:  Clear to auscultation bilaterally.  HEART:  S1 and S2.   Regular.  ABDOMEN:  Soft with focal tenderness to the right upper quadrant to deep  palpation.  No costovertebral or suprapubic tenderness.  No peritoneal  signs.  EXTREMITIES:  No evidence of edema.  Peripheral pulses are symmetrical and  palpable bilaterally.   LABORATORY DATA:  White count 15.4, hemoglobin 9.2, hematocrit 27, MCV 86,  platelet count 237, INR 1.2.  Sodium 129, potassium 3.6, chloride 95, CO2  26, glucose 107, BUN 35, creatinine 2.0, calcium 8.0, total protein 5.8,  albumin 6.8, AST 14, ALT 13, alk phos 65, total bilirubin 0.8, lipase 16,  amylase 31.  Urinalysis was unremarkable.   IMPRESSION:  1. Acute cholecystitis:  Await surgical intervention.  2. Renal insufficiency.  3. Hypotension.  4. Diabetes mellitus.  5. Leukocytosis.   PLAN:  At this time, Sue [name] is going to be admitted to a ICU stepdown.  She will be monitored closely.  She will be started on Unasyn.  Will fluid-  resuscitate her.  Keep her n.p.o.  Place her on antiemetics as well as  insulin sliding scale.  SCDs while she is in bed.  Anticipate OR as soon as  she can be scheduled.                                                Hettie Holstein, D.O.    ESS/MEDQ  D:  02/28/2003  T:  02/28/2003  Job:  [id number]   cc:   Brooke Bonito, M.D.  [address]  [place]  [place] [address]  Fax: [phone]

## 2010-06-12 NOTE — Op Note (Signed)
NAME:  Sue [name], Sue [name] NO.:  [id number]   MEDICAL RECORD NO.:  [id number]                   PATIENT TYPE:  AMB   LOCATION:  ENDO                                 FACILITY:  [facility]   PHYSICIAN:  Georgiana Spinner, M.D.                 DATE OF BIRTH:  Feb 14, 1932   DATE OF PROCEDURE:  02/07/2003  DATE OF DISCHARGE:                                 OPERATIVE REPORT   PROCEDURE:  Colonoscopy.   INDICATIONS:  Colon polyps.   ANESTHESIA:  Demerol 100, Versed 10 mg.   PROCEDURE:  With the patient mildly sedated in the left lateral decubitus  position, subsequently rolled to her back, the Olympus videoscopic variable  stiffness colonoscope was inserted in the rectum and passed under direct  vision to the ascending colon and could not be passed further despite  pressure or efforts.  Therefore, it was withdrawn.  Subsequently, the  Olympus videoscopic regular colonoscope was inserted in the rectum and  passed then rather easily after this to the cecum identified by ileocecal  valve and appendiceal orifice, both of which were photographed.  From this  point, the colonoscope was then slowly withdrawn taking circumferential  views of the colonic mucosa, suctioning liquid fecal debris as we went until  we reached the rectum which appeared normal on direct and retroflex view.  The endoscope was straightened and withdrawn.  The patient's vital signs and  pulse oximetry remained stable.  The patient tolerated the procedure well  without apparent complications.   FINDINGS:  Diverticulosis of the sigmoid colon.  Otherwise, an unremarkable  examination.   PLAN:  Have patient followup with me as needed.                                               Georgiana Spinner, M.D.    GMO/MEDQ  D:  02/07/2003  T:  02/07/2003  Job:  [id number]

## 2010-06-12 NOTE — Procedures (Signed)
[facility]. [facility]  Patient:    Sue [name], Sue [name] Visit Number: [id number] MRN: [id number]          Service Type: END Location: ENDO Attending Physician:  Sabino Gasser Dictated by:   Sabino Gasser, M.D. Proc. Date: 12/13/00 Admit Date:  12/13/2000                             Procedure Report  PROCEDURE PERFORMED:  Upper endoscopy.  ENDOSCOPIST:  Sabino Gasser, M.D.  INDICATIONS FOR PROCEDURE:  Inflammatory polyp seen in the esophagus with atypia.  ANESTHESIA:  Demerol 75 mg, Versed 7.5 mg.  DESCRIPTION OF PROCEDURE:  With the patient mildly sedated in the left lateral decubitus position, the Olympus video endoscope was inserted in the mouth and passed under direct vision through the esophagus, which appeared normal, no evidence of polyps at this time.  The endoscope was advanced back and forth in the esophagus and this all appeared clear.  We then entered into the stomach. The fundus, body, antrum, duodenal bulb and second portion of the duodenum all appeared normal.  From this point, the endoscope was slowly withdrawn taking circumferential views of the entire duodenal mucosa until the endoscope had been pulled back into the stomach and placed on retroflexion to view the stomach from below and a hiatal hernia was seen and photographed.  The endoscope was then straightened and withdrawn taking circumferential views of the remaining gastric and esophageal mucosa.  The patients vital signs and pulse oximeter remained stable.  The patient tolerated the procedure well without apparent complications.  FINDINGS:  Hiatal hernia.  Otherwise unremarkable examination.  PLAN:  Continue present medications, namely Prevacid.  Have patient follow up with me in a year or as needed. Dictated by:   Sabino Gasser, M.D. Attending Physician:  Sabino Gasser DD:  12/13/00 TD:  12/13/00 Job: [id number]

## 2010-11-13 ENCOUNTER — Other Ambulatory Visit: Payer: Self-pay | Admitting: Obstetrics and Gynecology

## 2010-11-13 DIAGNOSIS — Z1231 Encounter for screening mammogram for malignant neoplasm of breast: Secondary | ICD-10-CM

## 2010-12-04 ENCOUNTER — Ambulatory Visit
Admission: RE | Admit: 2010-12-04 | Discharge: 2010-12-04 | Disposition: A | Discharge status: Home / Self Care | Payer: Medicare Other | Source: Ambulatory Visit | Attending: Obstetrics and Gynecology | Admitting: Obstetrics and Gynecology

## 2010-12-04 DIAGNOSIS — Z1231 Encounter for screening mammogram for malignant neoplasm of breast: Secondary | ICD-10-CM

## 2012-07-06 ENCOUNTER — Other Ambulatory Visit: Payer: Self-pay

## 2012-07-06 DIAGNOSIS — Z1231 Encounter for screening mammogram for malignant neoplasm of breast: Secondary | ICD-10-CM

## 2012-08-11 ENCOUNTER — Ambulatory Visit
Admission: RE | Admit: 2012-08-11 | Discharge: 2012-08-11 | Disposition: A | Discharge status: Home / Self Care | Payer: Medicare Other | Source: Ambulatory Visit

## 2012-08-11 DIAGNOSIS — Z1231 Encounter for screening mammogram for malignant neoplasm of breast: Secondary | ICD-10-CM

## 2012-11-23 ENCOUNTER — Other Ambulatory Visit: Payer: Self-pay | Admitting: Internal Medicine

## 2012-11-23 DIAGNOSIS — R0989 Other specified symptoms and signs involving the circulatory and respiratory systems: Secondary | ICD-10-CM

## 2012-11-27 ENCOUNTER — Ambulatory Visit
Admission: RE | Admit: 2012-11-27 | Discharge: 2012-11-27 | Disposition: A | Discharge status: Home / Self Care | Payer: Medicare Other | Source: Ambulatory Visit | Attending: Internal Medicine | Admitting: Internal Medicine

## 2012-11-27 DIAGNOSIS — R0989 Other specified symptoms and signs involving the circulatory and respiratory systems: Secondary | ICD-10-CM

## 2013-12-12 ENCOUNTER — Other Ambulatory Visit: Payer: Self-pay

## 2013-12-12 DIAGNOSIS — Z1231 Encounter for screening mammogram for malignant neoplasm of breast: Secondary | ICD-10-CM

## 2014-01-09 ENCOUNTER — Ambulatory Visit
Admission: RE | Admit: 2014-01-09 | Discharge: 2014-01-09 | Disposition: A | Discharge status: Home / Self Care | Payer: Medicare Other | Source: Ambulatory Visit

## 2014-01-09 DIAGNOSIS — Z1231 Encounter for screening mammogram for malignant neoplasm of breast: Secondary | ICD-10-CM

## 2014-01-30 DIAGNOSIS — I10 Essential (primary) hypertension: Secondary | ICD-10-CM | POA: Diagnosis not present

## 2014-01-30 DIAGNOSIS — E782 Mixed hyperlipidemia: Secondary | ICD-10-CM | POA: Diagnosis not present

## 2014-01-30 DIAGNOSIS — N183 Chronic kidney disease, stage 3 (moderate): Secondary | ICD-10-CM | POA: Diagnosis not present

## 2014-01-30 DIAGNOSIS — E1165 Type 2 diabetes mellitus with hyperglycemia: Secondary | ICD-10-CM | POA: Diagnosis not present

## 2014-02-14 DIAGNOSIS — L84 Corns and callosities: Secondary | ICD-10-CM | POA: Diagnosis not present

## 2014-02-14 DIAGNOSIS — E118 Type 2 diabetes mellitus with unspecified complications: Secondary | ICD-10-CM | POA: Diagnosis not present

## 2014-03-06 ENCOUNTER — Ambulatory Visit (INDEPENDENT_AMBULATORY_CARE_PROVIDER_SITE_OTHER): Payer: Medicare Other | Admitting: Podiatry

## 2014-03-06 ENCOUNTER — Encounter: Payer: Self-pay | Admitting: Podiatry

## 2014-03-06 VITALS — BP 154/77 | HR 100 | Temp 98.1°F | Resp 13 | Ht 66.0 in | Wt 170.0 lb

## 2014-03-06 DIAGNOSIS — E1142 Type 2 diabetes mellitus with diabetic polyneuropathy: Secondary | ICD-10-CM | POA: Diagnosis not present

## 2014-03-06 DIAGNOSIS — G629 Polyneuropathy, unspecified: Secondary | ICD-10-CM

## 2014-03-06 DIAGNOSIS — L84 Corns and callosities: Secondary | ICD-10-CM

## 2014-03-06 NOTE — Progress Notes (Signed)
   Subjective:    Patient ID: Sue [name], female    DOB: Feb 15, 1932, 81 y.o.   MRN: [id number]  HPI Comments: N diabetic ulcer L right 2nd MPJ plantar D 1 month O began as callous C hard painful skin, with area of tunneling to the 2nd right toe A diabetic pt, painful to walk T referred Dr. Ashby Dawes  Pt has callouses to right 2nd toe and 1st MPJ medial.  Patient denies any history of foot ulceration or claudication No previous history of podiatric care   Review of Systems  HENT: Positive for hearing loss.   All other systems reviewed and are negative.      Objective:   Physical Exam  Orientated 3 patient presents with daughter who is in the treatment room with her  Vascular: DP pulses 2/4 bilaterally PT pulses 2/4 bilaterally Capillary reflex immediate bilaterally  Neurological: Sensation to 10 g monofilament wire intact 4/5 right and 3/5 left Vibratory sensation nonreactive bilaterally Ankle reflex reactive bilaterally  Dermatological: Large hemorrhagic callus plantar subsecond MPJ with dried blood extending from the ulcer to the basis second toe on the right foot. There is no erythema, edema, warmth or drainage surrounded this bleeding callus with extension of dried blood from this callus. 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 for diabetes and return in 4 weeks for diabetic shoe measurement and debridement of mycotic toenails

## 2014-03-06 NOTE — Patient Instructions (Signed)
Apply Vaseline to callus on the bottom of right foot and wear white cotton sock and a soft rubbery sole shoe We'll contact her medical doctor get clearance for diabetic shoes Diabetes and Foot Care Diabetes may cause you to have problems because of poor blood supply (circulation) to your feet and legs. This may cause the skin on your feet to become thinner, break easier, and heal more slowly. Your skin may become dry, and the skin may peel and crack. You may also have nerve damage in your legs and feet causing decreased feeling in them. You may not notice minor injuries to your feet that could lead to infections or more serious problems. Taking care of your feet is one of the most important things you can do for yourself.  HOME CARE INSTRUCTIONS  Wear shoes at all times, even in the house. Do not go barefoot. Bare feet are easily injured.  Check your feet daily for blisters, cuts, and redness. If you cannot see the bottom of your feet, use a mirror or ask someone for help.  Wash your feet with warm water (do not use hot water) and mild soap. Then pat your feet and the areas between your toes until they are completely dry. Do not soak your feet as this can dry your skin.  Apply a moisturizing lotion or petroleum jelly (that does not contain alcohol and is unscented) to the skin on your feet and to dry, brittle toenails. Do not apply lotion between your toes.  Trim your toenails straight across. Do not dig under them or around the cuticle. File the edges of your nails with an emery board or nail file.  Do not cut corns or calluses or try to remove them with medicine.  Wear clean socks or stockings every day. Make sure they are not too tight. Do not wear knee-high stockings since they may decrease blood flow to your legs.  Wear shoes that fit properly and have enough cushioning. To break in new shoes, wear them for just a few hours a day. This prevents you from injuring your feet. Always look in  your shoes before you put them on to be sure there are no objects inside.  Do not cross your legs. This may decrease the blood flow to your feet.  If you find a minor scrape, cut, or break in the skin on your feet, keep it and the skin around it clean and dry. These areas may be cleansed with mild soap and water. Do not cleanse the area with peroxide, alcohol, or iodine.  When you remove an adhesive bandage, be sure not to damage the skin around it.  If you have a wound, look at it several times a day to make sure it is healing.  Do not use heating pads or hot water bottles. They may burn your skin. If you have lost feeling in your feet or legs, you may not know it is happening until it is too late.  Make sure your health care provider performs a complete foot exam at least annually or more often if you have foot problems. Report any cuts, sores, or bruises to your health care provider immediately. SEEK MEDICAL CARE IF:   You have an injury that is not healing.  You have cuts or breaks in the skin.  You have an ingrown nail.  You notice redness on your legs or feet.  You feel burning or tingling in your legs or feet.  You have pain  or cramps in your legs and feet.  Your legs or feet are numb.  Your feet always feel cold. SEEK IMMEDIATE MEDICAL CARE IF:   There is increasing redness, swelling, or pain in or around a wound.  There is a red line that goes up your leg.  Pus is coming from a wound.  You develop a fever or as directed by your health care provider.  You notice a bad smell coming from an ulcer or wound. Document Released: 01/09/2000 Document Revised: 09/13/2012 Document Reviewed: 06/20/2012 [facility] Patient Information 2015 [place], [place]. This information is not intended to replace advice given to you by your health care provider. Make sure you discuss any questions you have with your health care provider.

## 2014-03-28 DIAGNOSIS — E118 Type 2 diabetes mellitus with unspecified complications: Secondary | ICD-10-CM | POA: Diagnosis not present

## 2014-03-28 DIAGNOSIS — I10 Essential (primary) hypertension: Secondary | ICD-10-CM | POA: Diagnosis not present

## 2014-04-03 ENCOUNTER — Ambulatory Visit (INDEPENDENT_AMBULATORY_CARE_PROVIDER_SITE_OTHER): Payer: Medicare Other | Admitting: Podiatry

## 2014-04-03 ENCOUNTER — Ambulatory Visit: Payer: Medicare Other | Admitting: Podiatry

## 2014-04-03 ENCOUNTER — Encounter: Payer: Self-pay | Admitting: Podiatry

## 2014-04-03 VITALS — BP 136/68 | HR 68 | Resp 16

## 2014-04-03 DIAGNOSIS — L84 Corns and callosities: Secondary | ICD-10-CM

## 2014-04-03 DIAGNOSIS — E1142 Type 2 diabetes mellitus with diabetic polyneuropathy: Secondary | ICD-10-CM

## 2014-04-03 DIAGNOSIS — B351 Tinea unguium: Secondary | ICD-10-CM

## 2014-04-03 NOTE — Patient Instructions (Addendum)
Check with your treating Dr. for diabetes to see if the certification form for diabetic shoes was signed  Diabetes and Foot Care Diabetes may cause you to have problems because of poor blood supply (circulation) to your feet and legs. This may cause the skin on your feet to become thinner, break easier, and heal more slowly. Your skin may become dry, and the skin may peel and crack. You may also have nerve damage in your legs and feet causing decreased feeling in them. You may not notice minor injuries to your feet that could lead to infections or more serious problems. Taking care of your feet is one of the most important things you can do for yourself.  HOME CARE INSTRUCTIONS  Wear shoes at all times, even in the house. Do not go barefoot. Bare feet are easily injured.  Check your feet daily for blisters, cuts, and redness. If you cannot see the bottom of your feet, use a mirror or ask someone for help.  Wash your feet with warm water (do not use hot water) and mild soap. Then pat your feet and the areas between your toes until they are completely dry. Do not soak your feet as this can dry your skin.  Apply a moisturizing lotion or petroleum jelly (that does not contain alcohol and is unscented) to the skin on your feet and to dry, brittle toenails. Do not apply lotion between your toes.  Trim your toenails straight across. Do not dig under them or around the cuticle. File the edges of your nails with an emery board or nail file.  Do not cut corns or calluses or try to remove them with medicine.  Wear clean socks or stockings every day. Make sure they are not too tight. Do not wear knee-high stockings since they may decrease blood flow to your legs.  Wear shoes that fit properly and have enough cushioning. To break in new shoes, wear them for just a few hours a day. This prevents you from injuring your feet. Always look in your shoes before you put them on to be sure there are no objects  inside.  Do not cross your legs. This may decrease the blood flow to your feet.  If you find a minor scrape, cut, or break in the skin on your feet, keep it and the skin around it clean and dry. These areas may be cleansed with mild soap and water. Do not cleanse the area with peroxide, alcohol, or iodine.  When you remove an adhesive bandage, be sure not to damage the skin around it.  If you have a wound, look at it several times a day to make sure it is healing.  Do not use heating pads or hot water bottles. They may burn your skin. If you have lost feeling in your feet or legs, you may not know it is happening until it is too late.  Make sure your health care provider performs a complete foot exam at least annually or more often if you have foot problems. Report any cuts, sores, or bruises to your health care provider immediately. SEEK MEDICAL CARE IF:   You have an injury that is not healing.  You have cuts or breaks in the skin.  You have an ingrown nail.  You notice redness on your legs or feet.  You feel burning or tingling in your legs or feet.  You have pain or cramps in your legs and feet.  Your legs or feet  are numb.  Your feet always feel cold. SEEK IMMEDIATE MEDICAL CARE IF:   There is increasing redness, swelling, or pain in or around a wound.  There is a red line that goes up your leg.  Pus is coming from a wound.  You develop a fever or as directed by your health care provider.  You notice a bad smell coming from an ulcer or wound. Document Released: 01/09/2000 Document Revised: 09/13/2012 Document Reviewed: 06/20/2012 [facility] Patient Information 2015 [place], [place]. This information is not intended to replace advice given to you by your health care provider. Make sure you discuss any questions you have with your health care provider.

## 2014-04-04 DIAGNOSIS — E1165 Type 2 diabetes mellitus with hyperglycemia: Secondary | ICD-10-CM | POA: Diagnosis not present

## 2014-04-04 DIAGNOSIS — E782 Mixed hyperlipidemia: Secondary | ICD-10-CM | POA: Diagnosis not present

## 2014-04-04 DIAGNOSIS — I10 Essential (primary) hypertension: Secondary | ICD-10-CM | POA: Diagnosis not present

## 2014-04-04 NOTE — Progress Notes (Signed)
Patient ID: Sue [name], female   DOB: 09/03/1932, 81 y.o.   MRN: [id number]  Subjective: This patient presents today requesting nail debridement. She has a history of pre-ulcerative callus on the right foot with pending certification for diabetic shoes. Her daughter is present in the room today  Objective: The toenails are elongated, brittle, discolored, hypertrophic 6-10 Bleeding callus sub-second MPJ right measuring 25 x 20 mm  Assessment: Onychomycoses 6-10 Diabetic peripheral neuropathy Pre-ulcerative callus plantar right  Plan: Debridement of toenails 10 and bleeding callus 1 Discuss with patient and daughter in general about her diabetic neuropathy and are pre-ulcerative callus. Patient has a scheduled visit with her medical doctor tomorrow and I asked patient and daughter to check on certification signature for diabetic shoes  Reappoint 4 weeks or sooner if patient has a concern

## 2014-04-30 DIAGNOSIS — E118 Type 2 diabetes mellitus with unspecified complications: Secondary | ICD-10-CM | POA: Diagnosis not present

## 2014-04-30 DIAGNOSIS — I10 Essential (primary) hypertension: Secondary | ICD-10-CM | POA: Diagnosis not present

## 2014-05-06 ENCOUNTER — Ambulatory Visit (INDEPENDENT_AMBULATORY_CARE_PROVIDER_SITE_OTHER): Payer: Medicare Other | Admitting: Podiatry

## 2014-05-06 ENCOUNTER — Encounter: Payer: Self-pay | Admitting: Podiatry

## 2014-05-06 VITALS — BP 107/67 | HR 89 | Resp 12

## 2014-05-06 DIAGNOSIS — L84 Corns and callosities: Secondary | ICD-10-CM

## 2014-05-06 DIAGNOSIS — E1142 Type 2 diabetes mellitus with diabetic polyneuropathy: Secondary | ICD-10-CM

## 2014-05-06 DIAGNOSIS — G629 Polyneuropathy, unspecified: Secondary | ICD-10-CM | POA: Diagnosis not present

## 2014-05-06 NOTE — Patient Instructions (Signed)
Diabetes and Foot Care Diabetes may cause you to have problems because of poor blood supply (circulation) to your feet and legs. This may cause the skin on your feet to become thinner, break easier, and heal more slowly. Your skin may become dry, and the skin may peel and crack. You may also have nerve damage in your legs and feet causing decreased feeling in them. You may not notice minor injuries to your feet that could lead to infections or more serious problems. Taking care of your feet is one of the most important things you can do for yourself.  HOME CARE INSTRUCTIONS  Wear shoes at all times, even in the house. Do not go barefoot. Bare feet are easily injured.  Check your feet daily for blisters, cuts, and redness. If you cannot see the bottom of your feet, use a mirror or ask someone for help.  Wash your feet with warm water (do not use hot water) and mild soap. Then pat your feet and the areas between your toes until they are completely dry. Do not soak your feet as this can dry your skin.  Apply a moisturizing lotion or petroleum jelly (that does not contain alcohol and is unscented) to the skin on your feet and to dry, brittle toenails. Do not apply lotion between your toes.  Trim your toenails straight across. Do not dig under them or around the cuticle. File the edges of your nails with an emery board or nail file.  Do not cut corns or calluses or try to remove them with medicine.  Wear clean socks or stockings every day. Make sure they are not too tight. Do not wear knee-high stockings since they may decrease blood flow to your legs.  Wear shoes that fit properly and have enough cushioning. To break in new shoes, wear them for just a few hours a day. This prevents you from injuring your feet. Always look in your shoes before you put them on to be sure there are no objects inside.  Do not cross your legs. This may decrease the blood flow to your feet.  If you find a minor scrape,  cut, or break in the skin on your feet, keep it and the skin around it clean and dry. These areas may be cleansed with mild soap and water. Do not cleanse the area with peroxide, alcohol, or iodine.  When you remove an adhesive bandage, be sure not to damage the skin around it.  If you have a wound, look at it several times a day to make sure it is healing.  Do not use heating pads or hot water bottles. They may burn your skin. If you have lost feeling in your feet or legs, you may not know it is happening until it is too late.  Make sure your health care provider performs a complete foot exam at least annually or more often if you have foot problems. Report any cuts, sores, or bruises to your health care provider immediately. SEEK MEDICAL CARE IF:   You have an injury that is not healing.  You have cuts or breaks in the skin.  You have an ingrown nail.  You notice redness on your legs or feet.  You feel burning or tingling in your legs or feet.  You have pain or cramps in your legs and feet.  Your legs or feet are numb.  Your feet always feel cold. SEEK IMMEDIATE MEDICAL CARE IF:   There is increasing redness,   swelling, or pain in or around a wound.  There is a red line that goes up your leg.  Pus is coming from a wound.  You develop a fever or as directed by your health care provider.  You notice a bad smell coming from an ulcer or wound. Document Released: 01/09/2000 Document Revised: 09/13/2012 Document Reviewed: 06/20/2012 [employer] Patient Information 2015 [employer]. This information is not intended to replace advice given to you by your health care provider. Make sure you discuss any questions you have with your health care provider.  

## 2014-05-07 NOTE — Progress Notes (Signed)
Patient ID: Sue [name], female   DOB: Jan 11, 1933, 81 y.o.   MRN: [id number]  Subjective: This patient presents today for ongoing treatment of pre-ulcerative callus on the plantar aspect of the right foot. Our office has made multiple attempts to get certification for diabetic shoes6, however, medical doctor at this time is not signed certification form  Objective: Plantar aspect of the right foot subsecond MPJ has bleeding callus 25 mm x 20 mm in diameter that remains closed after debridement  Assessment: Pre-ulcerative plantar callus right History of diabetic peripheral neuropathy  Plan: Debridement of pre-ulcerative callus right I discussed with patient and daughter was present treatment room the nonresponse of certification for diabetic shoes. Gave patient and daughter certification papers to present to treating doctor for diabetes for diabetic certification. Patient will return 4 weeks and if no certification is obtained I will refer patient to orthopedist for diabetic shoes.  Reappoint 4 weeks

## 2014-05-15 DIAGNOSIS — E118 Type 2 diabetes mellitus with unspecified complications: Secondary | ICD-10-CM | POA: Diagnosis not present

## 2014-05-21 DIAGNOSIS — E118 Type 2 diabetes mellitus with unspecified complications: Secondary | ICD-10-CM | POA: Diagnosis not present

## 2014-06-13 DIAGNOSIS — E118 Type 2 diabetes mellitus with unspecified complications: Secondary | ICD-10-CM | POA: Diagnosis not present

## 2014-06-13 DIAGNOSIS — I10 Essential (primary) hypertension: Secondary | ICD-10-CM | POA: Diagnosis not present

## 2014-06-13 DIAGNOSIS — E782 Mixed hyperlipidemia: Secondary | ICD-10-CM | POA: Diagnosis not present

## 2014-06-20 DIAGNOSIS — E118 Type 2 diabetes mellitus with unspecified complications: Secondary | ICD-10-CM | POA: Diagnosis not present

## 2014-08-19 ENCOUNTER — Ambulatory Visit: Payer: Medicare Other | Admitting: Podiatry

## 2014-08-21 ENCOUNTER — Encounter: Payer: Self-pay | Admitting: Podiatry

## 2014-08-21 ENCOUNTER — Ambulatory Visit (INDEPENDENT_AMBULATORY_CARE_PROVIDER_SITE_OTHER): Payer: Medicare Other | Admitting: Podiatry

## 2014-08-21 VITALS — BP 120/63 | HR 88 | Temp 98.2°F | Resp 14

## 2014-08-21 DIAGNOSIS — G629 Polyneuropathy, unspecified: Secondary | ICD-10-CM

## 2014-08-21 DIAGNOSIS — L84 Corns and callosities: Secondary | ICD-10-CM | POA: Diagnosis not present

## 2014-08-21 DIAGNOSIS — E1142 Type 2 diabetes mellitus with diabetic polyneuropathy: Secondary | ICD-10-CM

## 2014-08-21 NOTE — Patient Instructions (Addendum)
Rx  request for diabetic shoes to go to [employer] Dr[facility]Kohut have to certify this for you  Diabetes and Foot Care Diabetes may cause you to have problems because of poor blood supply (circulation) to your feet and legs. This may cause the skin on your feet to become thinner, break easier, and heal more slowly. Your skin may become dry, and the skin may peel and crack. You may also have nerve damage in your legs and feet causing decreased feeling in them. You may not notice minor injuries to your feet that could lead to infections or more serious problems. Taking care of your feet is one of the most important things you can do for yourself.  HOME CARE INSTRUCTIONS  Wear shoes at all times, even in the house. Do not go barefoot. Bare feet are easily injured.  Check your feet daily for blisters, cuts, and redness. If you cannot see the bottom of your feet, use a mirror or ask someone for help.  Wash your feet with warm water (do not use hot water) and mild soap. Then pat your feet and the areas between your toes until they are completely dry. Do not soak your feet as this can dry your skin.  Apply a moisturizing lotion or petroleum jelly (that does not contain alcohol and is unscented) to the skin on your feet and to dry, brittle toenails. Do not apply lotion between your toes.  Trim your toenails straight across. Do not dig under them or around the cuticle. File the edges of your nails with an emery board or nail file.  Do not cut corns or calluses or try to remove them with medicine.  Wear clean socks or stockings every day. Make sure they are not too tight. Do not wear knee-high stockings since they may decrease blood flow to your legs.  Wear shoes that fit properly and have enough cushioning. To break in new shoes, wear them for just a few hours a day. This prevents you from injuring your feet. Always look in your shoes before you put them on to be sure there are no objects inside.  Do not cross  your legs. This may decrease the blood flow to your feet.  If you find a minor scrape, cut, or break in the skin on your feet, keep it and the skin around it clean and dry. These areas may be cleansed with mild soap and water. Do not cleanse the area with peroxide, alcohol, or iodine.  When you remove an adhesive bandage, be sure not to damage the skin around it.  If you have a wound, look at it several times a day to make sure it is healing.  Do not use heating pads or hot water bottles. They may burn your skin. If you have lost feeling in your feet or legs, you may not know it is happening until it is too late.  Make sure your health care provider performs a complete foot exam at least annually or more often if you have foot problems. Report any cuts, sores, or bruises to your health care provider immediately. SEEK MEDICAL CARE IF:   You have an injury that is not healing.  You have cuts or breaks in the skin.  You have an ingrown nail.  You notice redness on your legs or feet.  You feel burning or tingling in your legs or feet.  You have pain or cramps in your legs and feet.  Your legs or feet are  numb.  Your feet always feel cold. SEEK IMMEDIATE MEDICAL CARE IF:   There is increasing redness, swelling, or pain in or around a wound.  There is a red line that goes up your leg.  Pus is coming from a wound.  You develop a fever or as directed by your health care provider.  You notice a bad smell coming from an ulcer or wound. Document Released: 01/09/2000 Document Revised: 09/13/2012 Document Reviewed: 06/20/2012 [facility] Patient Information 2015 [place], [place]. This information is not intended to replace advice given to you by your health care provider. Make sure you discuss any questions you have with your health care provider.

## 2014-08-22 NOTE — Progress Notes (Signed)
Patient ID: Sue [name], female   DOB: 08/07/1932, 81 y.o.   MRN: [id number]  Subjective: This patient presents for follow-up care for pre-ulcerative callus on the plantar aspect of the right foot. She was last seen on 05/06/2014 and at that time advised to return in 4 weeks from that date. She did not present until this visit. Also we have attempted to get certification for diabetic shoes from her primary care physician Dr.Kohut who will not sinus certification form for this  Objective: Diffuse plantar callus sub-second MPJ right without any bleeding within the callus. There is no surrounding erythema, edema, warmth noted  Assessment: Significant improvement of pre-ulcerative plantar callus, right foot Diabetic peripheral neuropathy  Plan: Today I debrided the plantar callus without any bleeding. At this time area appears to be stable and I will reevaluate patient  as needed or yearly  Also, I referred patient to [facility] for diabetic shoes and 3 sets of heat molded custom insoles for the indication of pre-ulcerative callus and diabetic peripheral neuropathy

## 2014-09-02 DIAGNOSIS — E789 Disorder of lipoprotein metabolism, unspecified: Secondary | ICD-10-CM | POA: Diagnosis not present

## 2014-09-02 DIAGNOSIS — E118 Type 2 diabetes mellitus with unspecified complications: Secondary | ICD-10-CM | POA: Diagnosis not present

## 2014-09-02 DIAGNOSIS — I10 Essential (primary) hypertension: Secondary | ICD-10-CM | POA: Diagnosis not present

## 2014-09-18 ENCOUNTER — Other Ambulatory Visit: Payer: Self-pay | Admitting: Dermatology

## 2014-09-18 DIAGNOSIS — L570 Actinic keratosis: Secondary | ICD-10-CM | POA: Diagnosis not present

## 2014-09-18 DIAGNOSIS — D239 Other benign neoplasm of skin, unspecified: Secondary | ICD-10-CM | POA: Diagnosis not present

## 2014-09-18 DIAGNOSIS — D485 Neoplasm of uncertain behavior of skin: Secondary | ICD-10-CM | POA: Diagnosis not present

## 2014-11-26 DIAGNOSIS — N183 Chronic kidney disease, stage 3 (moderate): Secondary | ICD-10-CM | POA: Diagnosis not present

## 2014-11-26 DIAGNOSIS — Z0000 Encounter for general adult medical examination without abnormal findings: Secondary | ICD-10-CM | POA: Diagnosis not present

## 2014-11-26 DIAGNOSIS — I10 Essential (primary) hypertension: Secondary | ICD-10-CM | POA: Diagnosis not present

## 2014-11-26 DIAGNOSIS — Z23 Encounter for immunization: Secondary | ICD-10-CM | POA: Diagnosis not present

## 2014-11-26 DIAGNOSIS — E118 Type 2 diabetes mellitus with unspecified complications: Secondary | ICD-10-CM | POA: Diagnosis not present

## 2015-01-16 DIAGNOSIS — E782 Mixed hyperlipidemia: Secondary | ICD-10-CM | POA: Diagnosis not present

## 2015-01-16 DIAGNOSIS — I10 Essential (primary) hypertension: Secondary | ICD-10-CM | POA: Diagnosis not present

## 2015-01-16 DIAGNOSIS — E1165 Type 2 diabetes mellitus with hyperglycemia: Secondary | ICD-10-CM | POA: Diagnosis not present

## 2015-01-16 DIAGNOSIS — N183 Chronic kidney disease, stage 3 (moderate): Secondary | ICD-10-CM | POA: Diagnosis not present

## 2015-03-25 DIAGNOSIS — H353130 Nonexudative age-related macular degeneration, bilateral, stage unspecified: Secondary | ICD-10-CM | POA: Diagnosis not present

## 2015-03-25 DIAGNOSIS — H524 Presbyopia: Secondary | ICD-10-CM | POA: Diagnosis not present

## 2015-03-25 DIAGNOSIS — E119 Type 2 diabetes mellitus without complications: Secondary | ICD-10-CM | POA: Diagnosis not present

## 2015-03-25 DIAGNOSIS — H2513 Age-related nuclear cataract, bilateral: Secondary | ICD-10-CM | POA: Diagnosis not present

## 2015-04-10 ENCOUNTER — Encounter (INDEPENDENT_AMBULATORY_CARE_PROVIDER_SITE_OTHER): Payer: Medicare Other | Admitting: Ophthalmology

## 2015-04-22 ENCOUNTER — Encounter (INDEPENDENT_AMBULATORY_CARE_PROVIDER_SITE_OTHER): Payer: Medicare Other | Admitting: Ophthalmology

## 2015-04-22 DIAGNOSIS — I10 Essential (primary) hypertension: Secondary | ICD-10-CM

## 2015-04-22 DIAGNOSIS — H43813 Vitreous degeneration, bilateral: Secondary | ICD-10-CM | POA: Diagnosis not present

## 2015-04-22 DIAGNOSIS — H353132 Nonexudative age-related macular degeneration, bilateral, intermediate dry stage: Secondary | ICD-10-CM | POA: Diagnosis not present

## 2015-04-22 DIAGNOSIS — H2513 Age-related nuclear cataract, bilateral: Secondary | ICD-10-CM | POA: Diagnosis not present

## 2015-04-22 DIAGNOSIS — H35033 Hypertensive retinopathy, bilateral: Secondary | ICD-10-CM

## 2015-06-24 ENCOUNTER — Encounter (INDEPENDENT_AMBULATORY_CARE_PROVIDER_SITE_OTHER): Payer: Medicare Other | Admitting: Ophthalmology

## 2015-06-24 DIAGNOSIS — H353122 Nonexudative age-related macular degeneration, left eye, intermediate dry stage: Secondary | ICD-10-CM | POA: Diagnosis not present

## 2015-06-24 DIAGNOSIS — I10 Essential (primary) hypertension: Secondary | ICD-10-CM

## 2015-06-24 DIAGNOSIS — H43813 Vitreous degeneration, bilateral: Secondary | ICD-10-CM | POA: Diagnosis not present

## 2015-06-24 DIAGNOSIS — H2513 Age-related nuclear cataract, bilateral: Secondary | ICD-10-CM | POA: Diagnosis not present

## 2015-06-24 DIAGNOSIS — H35033 Hypertensive retinopathy, bilateral: Secondary | ICD-10-CM | POA: Diagnosis not present

## 2015-06-24 DIAGNOSIS — H353111 Nonexudative age-related macular degeneration, right eye, early dry stage: Secondary | ICD-10-CM | POA: Diagnosis not present

## 2015-07-14 DIAGNOSIS — I10 Essential (primary) hypertension: Secondary | ICD-10-CM | POA: Diagnosis not present

## 2015-07-14 DIAGNOSIS — E782 Mixed hyperlipidemia: Secondary | ICD-10-CM | POA: Diagnosis not present

## 2015-07-14 DIAGNOSIS — N183 Chronic kidney disease, stage 3 (moderate): Secondary | ICD-10-CM | POA: Diagnosis not present

## 2015-07-21 DIAGNOSIS — E1165 Type 2 diabetes mellitus with hyperglycemia: Secondary | ICD-10-CM | POA: Diagnosis not present

## 2015-07-21 DIAGNOSIS — I10 Essential (primary) hypertension: Secondary | ICD-10-CM | POA: Diagnosis not present

## 2015-07-21 DIAGNOSIS — E782 Mixed hyperlipidemia: Secondary | ICD-10-CM | POA: Diagnosis not present

## 2015-07-21 DIAGNOSIS — E1142 Type 2 diabetes mellitus with diabetic polyneuropathy: Secondary | ICD-10-CM | POA: Diagnosis not present

## 2015-08-19 DIAGNOSIS — N898 Other specified noninflammatory disorders of vagina: Secondary | ICD-10-CM | POA: Diagnosis not present

## 2015-09-01 ENCOUNTER — Encounter (INDEPENDENT_AMBULATORY_CARE_PROVIDER_SITE_OTHER): Payer: Medicare Other | Admitting: Ophthalmology

## 2015-09-17 ENCOUNTER — Encounter (INDEPENDENT_AMBULATORY_CARE_PROVIDER_SITE_OTHER): Payer: Medicare Other | Admitting: Ophthalmology

## 2015-09-17 DIAGNOSIS — H2513 Age-related nuclear cataract, bilateral: Secondary | ICD-10-CM

## 2015-09-17 DIAGNOSIS — H353122 Nonexudative age-related macular degeneration, left eye, intermediate dry stage: Secondary | ICD-10-CM

## 2015-09-17 DIAGNOSIS — H353111 Nonexudative age-related macular degeneration, right eye, early dry stage: Secondary | ICD-10-CM | POA: Diagnosis not present

## 2015-09-17 DIAGNOSIS — I10 Essential (primary) hypertension: Secondary | ICD-10-CM

## 2015-09-17 DIAGNOSIS — H35033 Hypertensive retinopathy, bilateral: Secondary | ICD-10-CM

## 2015-09-17 DIAGNOSIS — H43813 Vitreous degeneration, bilateral: Secondary | ICD-10-CM

## 2015-11-17 ENCOUNTER — Encounter (INDEPENDENT_AMBULATORY_CARE_PROVIDER_SITE_OTHER): Payer: Medicare Other | Admitting: Ophthalmology

## 2015-11-26 ENCOUNTER — Encounter (INDEPENDENT_AMBULATORY_CARE_PROVIDER_SITE_OTHER): Payer: Medicare Other | Admitting: Ophthalmology

## 2015-11-26 DIAGNOSIS — H43813 Vitreous degeneration, bilateral: Secondary | ICD-10-CM

## 2015-11-26 DIAGNOSIS — I10 Essential (primary) hypertension: Secondary | ICD-10-CM | POA: Diagnosis not present

## 2015-11-26 DIAGNOSIS — H35033 Hypertensive retinopathy, bilateral: Secondary | ICD-10-CM

## 2015-11-26 DIAGNOSIS — H353132 Nonexudative age-related macular degeneration, bilateral, intermediate dry stage: Secondary | ICD-10-CM | POA: Diagnosis not present

## 2015-12-01 DIAGNOSIS — E782 Mixed hyperlipidemia: Secondary | ICD-10-CM | POA: Diagnosis not present

## 2015-12-01 DIAGNOSIS — E1165 Type 2 diabetes mellitus with hyperglycemia: Secondary | ICD-10-CM | POA: Diagnosis not present

## 2015-12-08 DIAGNOSIS — Z23 Encounter for immunization: Secondary | ICD-10-CM | POA: Diagnosis not present

## 2015-12-08 DIAGNOSIS — N183 Chronic kidney disease, stage 3 (moderate): Secondary | ICD-10-CM | POA: Diagnosis not present

## 2015-12-08 DIAGNOSIS — E782 Mixed hyperlipidemia: Secondary | ICD-10-CM | POA: Diagnosis not present

## 2015-12-08 DIAGNOSIS — E1142 Type 2 diabetes mellitus with diabetic polyneuropathy: Secondary | ICD-10-CM | POA: Diagnosis not present

## 2015-12-08 DIAGNOSIS — E1165 Type 2 diabetes mellitus with hyperglycemia: Secondary | ICD-10-CM | POA: Diagnosis not present

## 2016-02-26 ENCOUNTER — Encounter (INDEPENDENT_AMBULATORY_CARE_PROVIDER_SITE_OTHER): Payer: Medicare Other | Admitting: Ophthalmology

## 2016-02-26 DIAGNOSIS — H35033 Hypertensive retinopathy, bilateral: Secondary | ICD-10-CM

## 2016-02-26 DIAGNOSIS — I10 Essential (primary) hypertension: Secondary | ICD-10-CM

## 2016-02-26 DIAGNOSIS — H353132 Nonexudative age-related macular degeneration, bilateral, intermediate dry stage: Secondary | ICD-10-CM | POA: Diagnosis not present

## 2016-02-26 DIAGNOSIS — H2513 Age-related nuclear cataract, bilateral: Secondary | ICD-10-CM

## 2016-02-26 DIAGNOSIS — H43813 Vitreous degeneration, bilateral: Secondary | ICD-10-CM

## 2016-03-15 DIAGNOSIS — N183 Chronic kidney disease, stage 3 (moderate): Secondary | ICD-10-CM | POA: Diagnosis not present

## 2016-03-15 DIAGNOSIS — E782 Mixed hyperlipidemia: Secondary | ICD-10-CM | POA: Diagnosis not present

## 2016-03-15 DIAGNOSIS — E1165 Type 2 diabetes mellitus with hyperglycemia: Secondary | ICD-10-CM | POA: Diagnosis not present

## 2016-03-22 DIAGNOSIS — E782 Mixed hyperlipidemia: Secondary | ICD-10-CM | POA: Diagnosis not present

## 2016-03-22 DIAGNOSIS — N183 Chronic kidney disease, stage 3 (moderate): Secondary | ICD-10-CM | POA: Diagnosis not present

## 2016-03-22 DIAGNOSIS — E1165 Type 2 diabetes mellitus with hyperglycemia: Secondary | ICD-10-CM | POA: Diagnosis not present

## 2016-03-22 DIAGNOSIS — Z0000 Encounter for general adult medical examination without abnormal findings: Secondary | ICD-10-CM | POA: Diagnosis not present

## 2016-03-22 DIAGNOSIS — Z780 Asymptomatic menopausal state: Secondary | ICD-10-CM | POA: Diagnosis not present

## 2016-03-22 DIAGNOSIS — E1142 Type 2 diabetes mellitus with diabetic polyneuropathy: Secondary | ICD-10-CM | POA: Diagnosis not present

## 2016-03-29 DIAGNOSIS — H25013 Cortical age-related cataract, bilateral: Secondary | ICD-10-CM | POA: Diagnosis not present

## 2016-03-29 DIAGNOSIS — H2513 Age-related nuclear cataract, bilateral: Secondary | ICD-10-CM | POA: Diagnosis not present

## 2016-03-29 DIAGNOSIS — H353130 Nonexudative age-related macular degeneration, bilateral, stage unspecified: Secondary | ICD-10-CM | POA: Diagnosis not present

## 2016-03-29 DIAGNOSIS — E119 Type 2 diabetes mellitus without complications: Secondary | ICD-10-CM | POA: Diagnosis not present

## 2016-04-15 ENCOUNTER — Ambulatory Visit (INDEPENDENT_AMBULATORY_CARE_PROVIDER_SITE_OTHER): Payer: Medicare Other

## 2016-04-15 ENCOUNTER — Ambulatory Visit (INDEPENDENT_AMBULATORY_CARE_PROVIDER_SITE_OTHER): Payer: Medicare Other | Admitting: Emergency Medicine

## 2016-04-15 VITALS — BP 156/85 | HR 89 | Temp 98.3°F | Resp 16 | Ht 66.0 in | Wt 172.4 lb

## 2016-04-15 DIAGNOSIS — M791 Myalgia: Secondary | ICD-10-CM | POA: Diagnosis not present

## 2016-04-15 DIAGNOSIS — M25551 Pain in right hip: Secondary | ICD-10-CM

## 2016-04-15 DIAGNOSIS — M7918 Myalgia, other site: Secondary | ICD-10-CM

## 2016-04-15 DIAGNOSIS — M1611 Unilateral primary osteoarthritis, right hip: Secondary | ICD-10-CM | POA: Diagnosis not present

## 2016-04-15 MED ORDER — HYDROCODONE-ACETAMINOPHEN 5-325 MG PO TABS: 1.0000 | ORAL_TABLET | Freq: Four times a day (QID) | ORAL | 0 refills | Status: DC | PRN

## 2016-04-15 NOTE — Progress Notes (Signed)
Sue [name] 83 y.o.   Chief Complaint  Patient presents with  . Leg Pain    Right upper extemity    HISTORY OF PRESENT ILLNESS: This is a 83 y.o. female complaining of right hip/buttock pain since stumbling and almost falling 2 weeks ago; also strained right arm at that time. Leg Pain   The incident occurred more than 1 week ago. The incident occurred at home. The injury mechanism was a fall (near-fall). The pain is present in the right hip (and right buttock). The quality of the pain is described as aching. The pain is at a severity of 6/10. The pain is moderate. The pain has been constant since onset. Pertinent negatives include no loss of motion, loss of sensation, muscle weakness, numbness or tingling. Associated symptoms comments: Painful weight bearing..     Prior to Admission medications   Medication Sig Start Date End Date Taking? Authorizing Provider  amLODipine (NORVASC) 5 MG tablet Take 5 mg by mouth daily.   Yes Historical Provider, MD  empagliflozin (JARDIANCE) 25 MG TABS tablet Take 25 mg by mouth daily.   Yes Historical Provider, MD  enalapril (VASOTEC) 10 MG tablet Take 20 mg by mouth 2 (two) times daily.   Yes Historical Provider, MD  esomeprazole (NEXIUM) 20 MG capsule Take 20 mg by mouth daily.   Yes Historical Provider, MD  glipiZIDE (GLUCOTROL) 10 MG tablet Take 10 mg by mouth 2 (two) times daily before a meal.   Yes Historical Provider, MD  insulin glargine (LANTUS) 100 UNIT/ML injection Inject into the skin at bedtime.   Yes Historical Provider, MD  Multiple Vitamins-Minerals (CENTRUM SILVER PO) Take by mouth.   Yes Historical Provider, MD  nortriptyline (PAMELOR) 25 MG capsule Take 25 mg by mouth at bedtime.   Yes Historical Provider, MD  Omega-3 Fatty Acids (FISH OIL PO) Take by mouth.   Yes Historical Provider, MD  simvastatin (ZOCOR) 20 MG tablet Take 20 mg by mouth daily.   Yes Historical Provider, MD  sitaGLIPtin (JANUVIA) 100 MG tablet Take 100 mg by  mouth daily.   Yes Historical Provider, MD  HYDROcodone-acetaminophen (NORCO) 5-325 MG tablet Take 1 tablet by mouth every 6 (six) hours as needed for moderate pain. 04/15/16   Horald Pollen, MD    No Known Allergies  Patient Active Problem List   Diagnosis Date Noted  . Right hip pain 04/15/2016  . Right buttock pain 04/15/2016  . Primary osteoarthritis of right hip 04/15/2016    Past Medical History:  Diagnosis Date  . Diabetes mellitus without complication ([place])   . Hypertension     Past Surgical History:  Procedure Laterality Date  . ABDOMINAL HYSTERECTOMY    . CHOLECYSTECTOMY    . SPINE SURGERY      Social History   Social History  . Marital status: Married    Spouse name: N/A  . Number of children: N/A  . Years of education: N/A   Occupational History  . Not on file.   Social History Main Topics  . Smoking status: Former Research scientist (life sciences)  . Smokeless tobacco: Never Used  . Alcohol use No  . Drug use: No  . Sexual activity: Not on file   Other Topics Concern  . Not on file   Social History Narrative  . No narrative on file    No family history on file.   Review of Systems  Constitutional: Negative for chills and fever.  HENT: Negative.   Eyes: Negative.  Respiratory: Negative for cough and shortness of breath.   Cardiovascular: Negative for chest pain, claudication and leg swelling.  Gastrointestinal: Negative for abdominal pain, diarrhea, nausea and vomiting.  Genitourinary: Negative for dysuria and hematuria.  Musculoskeletal: Positive for back pain (right buttock) and joint pain (right hip).  Skin: Negative for rash.       No bruising.  Neurological: Negative for dizziness, tingling, sensory change, focal weakness, numbness and headaches.  All other systems reviewed and are negative.  Vitals:   04/15/16 1555  BP: (!) 156/85  Pulse: 89  Resp: 16  Temp: 98.3 F (36.8 C)    Physical Exam  Constitutional: She is oriented to person, place,  and time. She appears well-developed and well-nourished.  HENT:  Head: Normocephalic and atraumatic.  Nose: Nose normal.  Mouth/Throat: Oropharynx is clear and moist. No oropharyngeal exudate.  Eyes: Conjunctivae and EOM are normal. Pupils are equal, round, and reactive to light.  Neck: Normal range of motion. Neck supple.  Cardiovascular: Normal rate and regular rhythm.   Pulmonary/Chest: Effort normal.  Abdominal: Soft. She exhibits no distension. There is no tenderness.  Musculoskeletal:       Lumbar back: She exhibits decreased range of motion. She exhibits no bony tenderness, no swelling and no edema.  Right hip: +tenderness with LROM  Lymphadenopathy:    She has no cervical adenopathy.  Neurological: She is alert and oriented to person, place, and time. She displays normal reflexes. No sensory deficit. She exhibits normal muscle tone.  Skin: Skin is warm and dry. Capillary refill takes less than 2 seconds.  Psychiatric: She has a normal mood and affect. Her behavior is normal.  Vitals reviewed.  Xrays reviewed: no bony abnormality  ASSESSMENT & PLAN: Sue [name] was seen today for leg pain.  Diagnoses and all orders for this visit:  Right hip pain -     DG HIP UNILAT W OR W/O PELVIS 2-3 VIEWS RIGHT; Future  Right buttock pain -     DG HIP UNILAT W OR W/O PELVIS 2-3 VIEWS RIGHT; Future  Primary osteoarthritis of right hip  Other orders -     HYDROcodone-acetaminophen (NORCO) 5-325 MG tablet; Take 1 tablet by mouth every 6 (six) hours as needed for moderate pain.    Patient Instructions       IF you received an x-ray today, you will receive an invoice from [facility]. Please contact [facility] at [phone] with questions or concerns regarding your invoice.   IF you received labwork today, you will receive an invoice from [place]. Please contact [facility] at [phone] with questions or concerns regarding your invoice.   Our billing staff will  not be able to assist you with questions regarding bills from these companies.  You will be contacted with the lab results as soon as they are available. The fastest way to get your results is to activate your My Chart account. Instructions are located on the last page of this paperwork. If you have not heard from [place] regarding the results in 2 weeks, please contact this office.     Hip Pain The hip is the joint between the upper legs and the lower pelvis. The bones, cartilage, tendons, and muscles of your hip joint support your body and allow you to move around. Hip pain can range from a minor ache to severe pain in one or both of your hips. The pain may be felt on the inside of the hip joint near the groin, or the  outside near the buttocks and upper thigh. You may also have swelling or stiffness. Follow these instructions at home: Managing pain, stiffness, and swelling   If directed, apply ice to the injured area.  Put ice in a plastic bag.  Place a towel between your skin and the bag.  Leave the ice on for 20 minutes, 2-3 times a day  Sleep with a pillow between your legs on your most comfortable side.  Avoid any activities that cause pain. General instructions   Take over-the-counter and prescription medicines only as told by your health care provider.  Do any exercises as told by your health care provider.  Record the following:  How often you have hip pain.  The location of your pain.  What the pain feels like.  What makes the pain worse.  Keep all follow-up visits as told by your health care provider. This is important. Contact a health care provider if:  You cannot put weight on your leg.  Your pain or swelling continues or gets worse after one week.  It gets harder to walk.  You have a fever. Get help right away if:  You fall.  You have a sudden increase in pain and swelling in your hip.  Your hip is red or swollen or very tender to  touch. Summary  Hip pain can range from a minor ache to severe pain in one or both of your hips.  The pain may be felt on the inside of the hip joint near the groin, or the outside near the buttocks and upper thigh.  Avoid any activities that cause pain.  Record how often you have hip pain, the location of the pain, what makes it worse and what it feels like. This information is not intended to replace advice given to you by your health care provider. Make sure you discuss any questions you have with your health care provider. Document Released: 07/01/2009 Document Revised: 12/15/2015 Document Reviewed: 12/15/2015 Elsevier Interactive Patient Education  2017 Elsevier Inc.      Agustina Caroli, MD [facility]

## 2016-04-15 NOTE — Patient Instructions (Addendum)
     IF you received an x-ray today, you will receive an invoice from [facility]. Please contact [facility] at [phone] with questions or concerns regarding your invoice.   IF you received labwork today, you will receive an invoice from [place]. Please contact [facility] at [phone] with questions or concerns regarding your invoice.   Our billing staff will not be able to assist you with questions regarding bills from these companies.  You will be contacted with the lab results as soon as they are available. The fastest way to get your results is to activate your My Chart account. Instructions are located on the last page of this paperwork. If you have not heard from [place] regarding the results in 2 weeks, please contact this office.     Hip Pain The hip is the joint between the upper legs and the lower pelvis. The bones, cartilage, tendons, and muscles of your hip joint support your body and allow you to move around. Hip pain can range from a minor ache to severe pain in one or both of your hips. The pain may be felt on the inside of the hip joint near the groin, or the outside near the buttocks and upper thigh. You may also have swelling or stiffness. Follow these instructions at home: Managing pain, stiffness, and swelling   If directed, apply ice to the injured area.  Put ice in a plastic bag.  Place a towel between your skin and the bag.  Leave the ice on for 20 minutes, 2-3 times a day  Sleep with a pillow between your legs on your most comfortable side.  Avoid any activities that cause pain. General instructions   Take over-the-counter and prescription medicines only as told by your health care provider.  Do any exercises as told by your health care provider.  Record the following:  How often you have hip pain.  The location of your pain.  What the pain feels like.  What makes the pain worse.  Keep all follow-up visits as told by your  health care provider. This is important. Contact a health care provider if:  You cannot put weight on your leg.  Your pain or swelling continues or gets worse after one week.  It gets harder to walk.  You have a fever. Get help right away if:  You fall.  You have a sudden increase in pain and swelling in your hip.  Your hip is red or swollen or very tender to touch. Summary  Hip pain can range from a minor ache to severe pain in one or both of your hips.  The pain may be felt on the inside of the hip joint near the groin, or the outside near the buttocks and upper thigh.  Avoid any activities that cause pain.  Record how often you have hip pain, the location of the pain, what makes it worse and what it feels like. This information is not intended to replace advice given to you by your health care provider. Make sure you discuss any questions you have with your health care provider. Document Released: 07/01/2009 Document Revised: 12/15/2015 Document Reviewed: 12/15/2015 Elsevier Interactive Patient Education  2017 [employer].

## 2016-04-26 ENCOUNTER — Other Ambulatory Visit: Payer: Self-pay | Admitting: Internal Medicine

## 2016-04-26 DIAGNOSIS — Z1231 Encounter for screening mammogram for malignant neoplasm of breast: Secondary | ICD-10-CM

## 2016-04-29 ENCOUNTER — Ambulatory Visit
Admission: RE | Admit: 2016-04-29 | Discharge: 2016-04-29 | Disposition: A | Discharge status: Home / Self Care | Payer: Medicare Other | Source: Ambulatory Visit | Attending: Internal Medicine | Admitting: Internal Medicine

## 2016-04-29 DIAGNOSIS — Z1231 Encounter for screening mammogram for malignant neoplasm of breast: Secondary | ICD-10-CM

## 2016-05-12 DIAGNOSIS — E119 Type 2 diabetes mellitus without complications: Secondary | ICD-10-CM | POA: Diagnosis not present

## 2016-05-12 DIAGNOSIS — H25013 Cortical age-related cataract, bilateral: Secondary | ICD-10-CM | POA: Diagnosis not present

## 2016-05-12 DIAGNOSIS — H5703 Miosis: Secondary | ICD-10-CM | POA: Diagnosis not present

## 2016-05-12 DIAGNOSIS — H2513 Age-related nuclear cataract, bilateral: Secondary | ICD-10-CM | POA: Diagnosis not present

## 2016-05-12 DIAGNOSIS — H2512 Age-related nuclear cataract, left eye: Secondary | ICD-10-CM | POA: Diagnosis not present

## 2016-05-20 DIAGNOSIS — E1165 Type 2 diabetes mellitus with hyperglycemia: Secondary | ICD-10-CM | POA: Diagnosis not present

## 2016-05-26 DIAGNOSIS — H25812 Combined forms of age-related cataract, left eye: Secondary | ICD-10-CM | POA: Diagnosis not present

## 2016-05-26 DIAGNOSIS — H2512 Age-related nuclear cataract, left eye: Secondary | ICD-10-CM | POA: Diagnosis not present

## 2016-05-26 DIAGNOSIS — H2181 Floppy iris syndrome: Secondary | ICD-10-CM | POA: Diagnosis not present

## 2016-06-01 DIAGNOSIS — H2511 Age-related nuclear cataract, right eye: Secondary | ICD-10-CM | POA: Diagnosis not present

## 2016-06-01 DIAGNOSIS — H25011 Cortical age-related cataract, right eye: Secondary | ICD-10-CM | POA: Diagnosis not present

## 2016-06-09 DIAGNOSIS — H2181 Floppy iris syndrome: Secondary | ICD-10-CM | POA: Diagnosis not present

## 2016-06-09 DIAGNOSIS — H25811 Combined forms of age-related cataract, right eye: Secondary | ICD-10-CM | POA: Diagnosis not present

## 2016-06-09 DIAGNOSIS — H2511 Age-related nuclear cataract, right eye: Secondary | ICD-10-CM | POA: Diagnosis not present

## 2016-07-01 ENCOUNTER — Encounter (INDEPENDENT_AMBULATORY_CARE_PROVIDER_SITE_OTHER): Payer: Medicare Other | Admitting: Ophthalmology

## 2016-07-01 DIAGNOSIS — E1165 Type 2 diabetes mellitus with hyperglycemia: Secondary | ICD-10-CM | POA: Diagnosis not present

## 2016-08-23 DIAGNOSIS — E782 Mixed hyperlipidemia: Secondary | ICD-10-CM | POA: Diagnosis not present

## 2016-08-23 DIAGNOSIS — E1165 Type 2 diabetes mellitus with hyperglycemia: Secondary | ICD-10-CM | POA: Diagnosis not present

## 2016-08-30 DIAGNOSIS — E782 Mixed hyperlipidemia: Secondary | ICD-10-CM | POA: Diagnosis not present

## 2016-08-30 DIAGNOSIS — E1165 Type 2 diabetes mellitus with hyperglycemia: Secondary | ICD-10-CM | POA: Diagnosis not present

## 2016-08-30 DIAGNOSIS — I129 Hypertensive chronic kidney disease with stage 1 through stage 4 chronic kidney disease, or unspecified chronic kidney disease: Secondary | ICD-10-CM | POA: Diagnosis not present

## 2016-10-05 DIAGNOSIS — H35033 Hypertensive retinopathy, bilateral: Secondary | ICD-10-CM | POA: Diagnosis not present

## 2016-10-05 DIAGNOSIS — E119 Type 2 diabetes mellitus without complications: Secondary | ICD-10-CM | POA: Diagnosis not present

## 2016-10-05 DIAGNOSIS — H5703 Miosis: Secondary | ICD-10-CM | POA: Diagnosis not present

## 2016-10-05 DIAGNOSIS — Z961 Presence of intraocular lens: Secondary | ICD-10-CM | POA: Diagnosis not present

## 2016-10-25 ENCOUNTER — Encounter: Payer: Self-pay | Admitting: Podiatry

## 2016-10-25 ENCOUNTER — Ambulatory Visit (INDEPENDENT_AMBULATORY_CARE_PROVIDER_SITE_OTHER): Payer: Medicare Other | Admitting: Podiatry

## 2016-10-25 DIAGNOSIS — E1142 Type 2 diabetes mellitus with diabetic polyneuropathy: Secondary | ICD-10-CM | POA: Diagnosis not present

## 2016-10-25 DIAGNOSIS — L84 Corns and callosities: Secondary | ICD-10-CM | POA: Diagnosis not present

## 2016-10-25 NOTE — Patient Instructions (Signed)

## 2016-10-25 NOTE — Progress Notes (Signed)
   Subjective:    Patient ID: Sue [name], female    DOB: 29-Sep-1932, 83 y.o.   MRN: [id number]  HPI This patient presents today complaining of a painful plantar callus subsecond MPJ in the right foot uncomfortable when walking and standing and relieved with rest and elevation patient describes no self treatment are reviewed recent professional treatment patient presented for a similar problem in our office on 08/21/2014 and states that after debridement of the plantar callus she's remained relatively comfortable until the last several months Patient is diabetic and denies history of foot ulceration, claudication or amputation Patient is a former smoker discontinued 50 years prior   Review of Systems  All other systems reviewed and are negative.      Objective:   Physical Exam  Patient's daughter is present in the treatment room  Orientated 3  Vascular: DP and PT pulses 2/4 bilaterally Capillary reflex is normal limits bilaterally No calf edema or calf tenderness bilaterally  Neurological: Sensation to 10 g monofilament wire intact 7/8 right and 5/8 left Vibratory sensation nonreactive bilaterally Ankle reflexes reactive bilaterally  Dermatological: No open skin lesions bilaterally Atrophic skin with absent hair growth bilaterally Nucleated keratoses subsecond MPJ right  Musculoskeletal: HAV right Hammertoe second right Manual motor testing dorsi flexion, plantar flexion, inversion, eversion 5/5 bilaterally      Assessment & Plan:   Assessment: Diabetic with peripheral neuropathy 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 I recommended debridement of the lesion and a consent The plantar lesion was debrided without any bleeding Discuss general foot care with patient  Reappoint as needed or yearly

## 2016-12-06 DIAGNOSIS — E1165 Type 2 diabetes mellitus with hyperglycemia: Secondary | ICD-10-CM | POA: Diagnosis not present

## 2016-12-13 DIAGNOSIS — E1142 Type 2 diabetes mellitus with diabetic polyneuropathy: Secondary | ICD-10-CM | POA: Diagnosis not present

## 2016-12-13 DIAGNOSIS — E1121 Type 2 diabetes mellitus with diabetic nephropathy: Secondary | ICD-10-CM | POA: Diagnosis not present

## 2016-12-13 DIAGNOSIS — I129 Hypertensive chronic kidney disease with stage 1 through stage 4 chronic kidney disease, or unspecified chronic kidney disease: Secondary | ICD-10-CM | POA: Diagnosis not present

## 2016-12-13 DIAGNOSIS — E782 Mixed hyperlipidemia: Secondary | ICD-10-CM | POA: Diagnosis not present

## 2016-12-13 DIAGNOSIS — N183 Chronic kidney disease, stage 3 (moderate): Secondary | ICD-10-CM | POA: Diagnosis not present

## 2016-12-13 DIAGNOSIS — Z23 Encounter for immunization: Secondary | ICD-10-CM | POA: Diagnosis not present

## 2016-12-13 DIAGNOSIS — E1165 Type 2 diabetes mellitus with hyperglycemia: Secondary | ICD-10-CM | POA: Diagnosis not present

## 2017-02-10 DIAGNOSIS — E119 Type 2 diabetes mellitus without complications: Secondary | ICD-10-CM | POA: Diagnosis not present

## 2017-02-10 DIAGNOSIS — H43391 Other vitreous opacities, right eye: Secondary | ICD-10-CM | POA: Diagnosis not present

## 2017-02-10 DIAGNOSIS — H353132 Nonexudative age-related macular degeneration, bilateral, intermediate dry stage: Secondary | ICD-10-CM | POA: Diagnosis not present

## 2017-02-10 DIAGNOSIS — H35033 Hypertensive retinopathy, bilateral: Secondary | ICD-10-CM | POA: Diagnosis not present

## 2017-03-31 DIAGNOSIS — J399 Disease of upper respiratory tract, unspecified: Secondary | ICD-10-CM | POA: Diagnosis not present

## 2017-03-31 DIAGNOSIS — R05 Cough: Secondary | ICD-10-CM | POA: Diagnosis not present

## 2017-03-31 DIAGNOSIS — J3489 Other specified disorders of nose and nasal sinuses: Secondary | ICD-10-CM | POA: Diagnosis not present

## 2017-03-31 DIAGNOSIS — R5383 Other fatigue: Secondary | ICD-10-CM | POA: Diagnosis not present

## 2017-05-16 DIAGNOSIS — E1165 Type 2 diabetes mellitus with hyperglycemia: Secondary | ICD-10-CM | POA: Diagnosis not present

## 2017-05-16 DIAGNOSIS — Z0000 Encounter for general adult medical examination without abnormal findings: Secondary | ICD-10-CM | POA: Diagnosis not present

## 2017-05-16 DIAGNOSIS — E782 Mixed hyperlipidemia: Secondary | ICD-10-CM | POA: Diagnosis not present

## 2017-05-23 DIAGNOSIS — I129 Hypertensive chronic kidney disease with stage 1 through stage 4 chronic kidney disease, or unspecified chronic kidney disease: Secondary | ICD-10-CM | POA: Diagnosis not present

## 2017-05-23 DIAGNOSIS — E782 Mixed hyperlipidemia: Secondary | ICD-10-CM | POA: Diagnosis not present

## 2017-05-23 DIAGNOSIS — Z23 Encounter for immunization: Secondary | ICD-10-CM | POA: Diagnosis not present

## 2017-05-23 DIAGNOSIS — D508 Other iron deficiency anemias: Secondary | ICD-10-CM | POA: Diagnosis not present

## 2017-05-23 DIAGNOSIS — E1142 Type 2 diabetes mellitus with diabetic polyneuropathy: Secondary | ICD-10-CM | POA: Diagnosis not present

## 2017-05-23 DIAGNOSIS — E1121 Type 2 diabetes mellitus with diabetic nephropathy: Secondary | ICD-10-CM | POA: Diagnosis not present

## 2017-05-23 DIAGNOSIS — E1165 Type 2 diabetes mellitus with hyperglycemia: Secondary | ICD-10-CM | POA: Diagnosis not present

## 2017-06-13 DIAGNOSIS — Z1212 Encounter for screening for malignant neoplasm of rectum: Secondary | ICD-10-CM | POA: Diagnosis not present

## 2017-07-06 ENCOUNTER — Other Ambulatory Visit: Payer: Self-pay | Admitting: Internal Medicine

## 2017-07-06 ENCOUNTER — Ambulatory Visit (HOSPITAL_COMMUNITY)
Admission: RE | Admit: 2017-07-06 | Discharge: 2017-07-06 | Disposition: A | Discharge status: Home / Self Care | Payer: Medicare Other | Source: Ambulatory Visit | Attending: Internal Medicine | Admitting: Internal Medicine

## 2017-07-06 DIAGNOSIS — M7989 Other specified soft tissue disorders: Secondary | ICD-10-CM | POA: Insufficient documentation

## 2017-07-06 DIAGNOSIS — R9389 Abnormal findings on diagnostic imaging of other specified body structures: Secondary | ICD-10-CM | POA: Diagnosis not present

## 2017-07-06 DIAGNOSIS — R609 Edema, unspecified: Secondary | ICD-10-CM

## 2017-07-06 DIAGNOSIS — R600 Localized edema: Secondary | ICD-10-CM | POA: Diagnosis not present

## 2017-07-06 NOTE — Progress Notes (Signed)
LLE venous duplex prelim: negative for DVT. Landry Mellow, RDMS, RVT  Called results to [facility].

## 2017-08-09 NOTE — Progress Notes (Signed)
[facility] Note  08/10/2017     CHIEF COMPLAINT Patient presents for Retina Evaluation and Diabetic Eye Exam   HISTORY OF PRESENT ILLNESS: Sue [name] is a 84 y.o. female who presents to the clinic today for:   HPI    Retina Evaluation    In both eyes.  This started 1 year ago.  Associated Symptoms Floaters.  Negative for Flashes, Blind Spot, Photophobia, Scalp Tenderness, Weight Loss, Jaw Claudication, Glare, Pain, Distortion, Redness, Trauma, Shoulder/Hip pain, Fatigue and Fever.  Context:  distance vision, mid-range vision and near vision.  Treatments tried include surgery.  Response to treatment was significant improvement.  I, the attending physician,  performed the HPI with the patient and updated documentation appropriately.          Diabetic Eye Exam    Vision is stable.  Associated Symptoms Negative for Distortion, Redness, Trauma, Shoulder/Hip pain, Fatigue, Weight Loss, Jaw Claudication, Glare, Pain, Floaters, Flashes, Blind Spot, Photophobia, Scalp Tenderness and Fever.  Diabetes characteristics include Type 2, on insulin and taking oral medications.  This started 20 years ago.  Blood sugar level is controlled.  Last Blood Glucose 170.  Last A1C 7.  I, the attending physician,  performed the HPI with the patient and updated documentation appropriately.          Comments    Referral of Dr. Kathlen Mody for retina eval. Patient states about a year ago she had cataract sx and the lens broke OD, sx was repeated OD, since then she has had occasional floaters OD, denies flashes and ocular pain. Pt is DM2 appx 20 years , Bs are usually stable "except when she goes out to eat they may run a little high" Bs 170 this am, A1C 7.0  (4 months ago), pt is on Insulin and glipizide.       Last edited by Bernarda Caffey, MD on 08/10/2017  2:35 PM. (History)    Pt states she was sent here from [place]; Pt states she has seen Dr. Zigmund Daniel before; Pt states she  had cataract sx by Dr. Kathlen Mody; Pt reports taking preservision qd;   Referring physician: Hortencia Pilar, MD [place], [place] [address]  HISTORICAL INFORMATION:   Selected notes from the MEDICAL RECORD NUMBER Referred by Dr. Quentin Ore for concern of ARMD OU LEE: 01.17.19 Read Drivers) [BCVA: OD: 20/30-1 OS: 20/40] Ocular Hx-floaters, ARMD OU, HTN ret OU, PVD OS, dry eye syndrome OU, Pseudophakia OU, pupillary miosis OU PMH-DM (taking Lantus, Januvia and Jardiance), HTN    CURRENT MEDICATIONS: No current outpatient medications on file. (Ophthalmic Drugs)   No current facility-administered medications for this visit.  (Ophthalmic Drugs)   Current Outpatient Medications (Other)  Medication Sig  . amLODipine (NORVASC) 5 MG tablet Take 5 mg by mouth daily.  . empagliflozin (JARDIANCE) 25 MG TABS tablet Take 25 mg by mouth daily.  . enalapril (VASOTEC) 10 MG tablet Take 20 mg by mouth 2 (two) times daily.  Marland Kitchen esomeprazole (NEXIUM) 20 MG capsule Take 20 mg by mouth daily.  Marland Kitchen glipiZIDE (GLUCOTROL) 10 MG tablet Take 10 mg by mouth 2 (two) times daily before a meal.  . insulin glargine (LANTUS) 100 UNIT/ML injection Inject into the skin at bedtime.  . Multiple Vitamins-Minerals (CENTRUM SILVER PO) Take by mouth.  . nortriptyline (PAMELOR) 25 MG capsule Take 25 mg by mouth at bedtime.  . Omega-3 Fatty Acids (FISH OIL PO) Take by mouth.  Marland Kitchen  simvastatin (ZOCOR) 20 MG tablet Take 20 mg by mouth daily.  Marland Kitchen HYDROcodone-acetaminophen (NORCO) 5-325 MG tablet Take 1 tablet by mouth every 6 (six) hours as needed for moderate pain. (Patient not taking: Reported on 08/10/2017)  . sitaGLIPtin (JANUVIA) 100 MG tablet Take 100 mg by mouth daily.   No current facility-administered medications for this visit.  (Other)      REVIEW OF SYSTEMS: ROS    Positive for: Endocrine, Eyes   Negative for: Constitutional, Gastrointestinal, Neurological, Skin, Genitourinary, Musculoskeletal,  HENT, Cardiovascular, Respiratory, Psychiatric, Allergic/Imm, Heme/Lymph   Last edited by Zenovia Jordan, LPN on 5/62/5638  9:37 PM. (History)       ALLERGIES No Known Allergies  PAST MEDICAL HISTORY Past Medical History:  Diagnosis Date  . Diabetes mellitus without complication ([place])   . Hypertension    Past Surgical History:  Procedure Laterality Date  . ABDOMINAL HYSTERECTOMY    . CATARACT EXTRACTION    . CHOLECYSTECTOMY    . EYE SURGERY    . SPINE SURGERY      FAMILY HISTORY History reviewed. No pertinent family history.  SOCIAL HISTORY Social History   Tobacco Use  . Smoking status: Former Research scientist (life sciences)  . Smokeless tobacco: Never Used  Substance Use Topics  . Alcohol use: No    Alcohol/week: 0.0 oz  . Drug use: No         OPHTHALMIC EXAM:  Base Eye Exam    Visual Acuity (Snellen - Linear)      Right Left   Dist cc 20/40 20/40   Dist ph cc NI NI   Correction:  Glasses       Tonometry (Tonopen, 1:17 PM)      Right Left   Pressure 15 14       Pupils      Dark Light Shape React APD   Right 3 2 Round Brisk None   Left 3 2 Round Brisk None       Visual Fields (Counting fingers)      Left Right    Full Full       Extraocular Movement      Right Left    Full, Ortho Full, Ortho       Neuro/Psych    Oriented x3:  Yes   Mood/Affect:  Normal       Dilation    Both eyes:  1.0% Mydriacyl, 2.5% Phenylephrine @ 1:16 PM        Slit Lamp and Fundus Exam    Slit Lamp Exam      Right Left   Lids/Lashes Dermatochalasis - upper lid, Telangiectasia Dermatochalasis - upper lid, Telangiectasia, Meibomian gland dysfunction   Conjunctiva/Sclera White and quiet White and quiet   Cornea Temporal Well healed cataract wounds, mild Arcus, trace endo pigment nasally Arcus, Temporal Well healed cataract wounds   Anterior Chamber Deep and quiet Deep and quiet   Iris Round and moderately dilated to 4.33m Round and moderately dilated to 570m Iris atrophy from  0100 to 0230, peripheral irodotimy at 0200   Lens PC IOL in good position PC IOL in good position   Vitreous Vitreous syneresis, Posterior vitreous detachment, Weiss ring Vitreous syneresis       Fundus Exam      Right Left   Disc 360 Peripapillary atrophy, diffuse mild pallor Pink and Sharp, Peripapillary atrophy and pigmentation   C/D Ratio 0.3 0.4   Macula Blunted foveal reflex, +PED, RPE mottling and clumping, Drusen, No heme or edema  Blunted foveal reflex, +PED, RPE mottling and clumping, Drusen, No heme or edema   Vessels Vascular attenuation Vascular attenuation   Periphery Attached, 360 Reticular degeneration, peripheral view limited by poor dilation Attached, 360 Reticular degeneration        Refraction    Manifest Refraction      Sphere Cylinder Axis Dist VA   Right +0.50 Sphere  20/40   Left +0.25 +0.03 180 20/30+2          IMAGING AND PROCEDURES  Imaging and Procedures for '@TODAY' @  OCT, Retina - OU - Both Eyes       Right Eye Quality was good. Central Foveal Thickness: 233. Progression has been stable. Findings include normal foveal contour, no IRF, no SRF, pigment epithelial detachment, outer retinal atrophy, retinal drusen .   Left Eye Quality was good. Central Foveal Thickness: 247. Progression has been stable. Findings include normal foveal contour, no IRF, no SRF, retinal drusen , pigment epithelial detachment, outer retinal atrophy.   Notes *Images captured and stored on drive  Diagnosis / Impression:  OD: Non-Exudative ARMD OS: Non-Exudative ARMD  Clinical management:  See below  Abbreviations: NFP - Normal foveal profile. CME - cystoid macular edema. PED - pigment epithelial detachment. IRF - intraretinal fluid. SRF - subretinal fluid. EZ - ellipsoid zone. ERM - epiretinal membrane. ORA - outer retinal atrophy. ORT - outer retinal tubulation. SRHM - subretinal hyper-reflective material                  ASSESSMENT/PLAN:    ICD-10-CM    1. Intermediate stage nonexudative age-related macular degeneration of both eyes H35.3132   2. Diabetes mellitus type 2 without retinopathy ([facility]) E11.9   3. Hypertensive retinopathy of both eyes H35.033   4. Essential hypertension I10   5. Retinal edema H35.81 OCT, Retina - OU - Both Eyes  6. Pseudophakia of both eyes Z96.1     1. Age related macular degeneration, non-exudative, both eyes  - stable, former pt of Dr. Zigmund Daniel -- no significant change in OCT or exam  - The incidence, anatomy, and pathology of dry AMD, risk of progression, and the AREDS and AREDS 2 study including smoking risks discussed with patient.  - Recommend amsler grid monitoring  - f/u 6 months  2. Diabetes mellitus, type 2 without retinopathy - The incidence, risk factors for progression, natural history and treatment options for diabetic retinopathy  were discussed with patient.   - The need for close monitoring of blood glucose, blood pressure, and serum lipids, avoiding cigarette or any type of tobacco, and the need for long term follow up was also discussed with patient. - f/u in 1 year, sooner prn  3, 4. Hypertensive retinopathy OU - discussed importance of tight BP control - monitor  5. No retinal edema on exam or OCT  6. Pseudophakia OU  - s/p CE/IOL  - beautiful surgery, doing well  - monitor   Ophthalmic Meds Ordered this visit:  No orders of the defined types were placed in this encounter.      Return in about 6 months (around 02/10/2018) for F/U Non-Exu AMD, DFE, OCT.  There are no Patient Instructions on file for this visit.   Explained the diagnoses, plan, and follow up with the patient and they expressed understanding.  Patient expressed understanding of the importance of proper follow up care.   This document serves as a record of services personally performed by Gardiner Sleeper, MD, PhD. It was created  on their behalf by Ernest Mallick, OA, an ophthalmic assistant. The creation of this  record is the provider's dictation and/or activities during the visit.    Electronically signed by: Ernest Mallick, OA  07.16.2019 2:31 PM    Gardiner Sleeper, M.D., Ph.D. Diseases & Surgery of the Retina and Vitreous [facility]   I have reviewed the above documentation for accuracy and completeness, and I agree with the above. Gardiner Sleeper, M.D., Ph.D. 08/11/17 2:33 PM   Abbreviations: M myopia (nearsighted); A astigmatism; H hyperopia (farsighted); P presbyopia; Mrx spectacle prescription;  CTL contact lenses; OD right eye; OS left eye; OU both eyes  XT exotropia; ET esotropia; PEK punctate epithelial keratitis; PEE punctate epithelial erosions; DES dry eye syndrome; MGD meibomian gland dysfunction; ATs artificial tears; PFAT's preservative free artificial tears; [place] nuclear sclerotic cataract; PSC posterior subcapsular cataract; ERM epi-retinal membrane; PVD posterior vitreous detachment; RD retinal detachment; DM diabetes mellitus; DR diabetic retinopathy; NPDR non-proliferative diabetic retinopathy; PDR proliferative diabetic retinopathy; CSME clinically significant macular edema; DME diabetic macular edema; dbh dot blot hemorrhages; CWS cotton wool spot; POAG primary open angle glaucoma; C/D cup-to-disc ratio; HVF humphrey visual field; GVF goldmann visual field; OCT optical coherence tomography; IOP intraocular pressure; BRVO Branch retinal vein occlusion; CRVO central retinal vein occlusion; CRAO central retinal artery occlusion; BRAO branch retinal artery occlusion; RT retinal tear; SB scleral buckle; PPV pars plana vitrectomy; VH Vitreous hemorrhage; PRP panretinal laser photocoagulation; IVK intravitreal kenalog; VMT vitreomacular traction; MH Macular hole;  NVD neovascularization of the disc; NVE neovascularization elsewhere; AREDS age related eye disease study; ARMD age related macular degeneration; POAG primary open angle glaucoma; EBMD epithelial/anterior basement  membrane dystrophy; ACIOL anterior chamber intraocular lens; IOL intraocular lens; PCIOL posterior chamber intraocular lens; Phaco/IOL phacoemulsification with intraocular lens placement; Murphy photorefractive keratectomy; LASIK laser assisted in situ keratomileusis; HTN hypertension; DM diabetes mellitus; COPD chronic obstructive pulmonary disease

## 2017-08-10 ENCOUNTER — Encounter (INDEPENDENT_AMBULATORY_CARE_PROVIDER_SITE_OTHER): Payer: Self-pay | Admitting: Ophthalmology

## 2017-08-10 ENCOUNTER — Ambulatory Visit (INDEPENDENT_AMBULATORY_CARE_PROVIDER_SITE_OTHER): Payer: Medicare Other | Admitting: Ophthalmology

## 2017-08-10 DIAGNOSIS — H3581 Retinal edema: Secondary | ICD-10-CM

## 2017-08-10 DIAGNOSIS — H35033 Hypertensive retinopathy, bilateral: Secondary | ICD-10-CM | POA: Diagnosis not present

## 2017-08-10 DIAGNOSIS — I10 Essential (primary) hypertension: Secondary | ICD-10-CM

## 2017-08-10 DIAGNOSIS — Z961 Presence of intraocular lens: Secondary | ICD-10-CM

## 2017-08-10 DIAGNOSIS — E119 Type 2 diabetes mellitus without complications: Secondary | ICD-10-CM | POA: Diagnosis not present

## 2017-08-10 DIAGNOSIS — H353132 Nonexudative age-related macular degeneration, bilateral, intermediate dry stage: Secondary | ICD-10-CM

## 2017-08-11 ENCOUNTER — Encounter (INDEPENDENT_AMBULATORY_CARE_PROVIDER_SITE_OTHER): Payer: Self-pay | Admitting: Ophthalmology

## 2017-10-05 DIAGNOSIS — E1165 Type 2 diabetes mellitus with hyperglycemia: Secondary | ICD-10-CM | POA: Diagnosis not present

## 2017-10-05 DIAGNOSIS — D649 Anemia, unspecified: Secondary | ICD-10-CM | POA: Diagnosis not present

## 2017-10-05 DIAGNOSIS — D508 Other iron deficiency anemias: Secondary | ICD-10-CM | POA: Diagnosis not present

## 2017-10-26 DIAGNOSIS — Z23 Encounter for immunization: Secondary | ICD-10-CM | POA: Diagnosis not present

## 2017-10-26 DIAGNOSIS — E1165 Type 2 diabetes mellitus with hyperglycemia: Secondary | ICD-10-CM | POA: Diagnosis not present

## 2017-10-26 DIAGNOSIS — I129 Hypertensive chronic kidney disease with stage 1 through stage 4 chronic kidney disease, or unspecified chronic kidney disease: Secondary | ICD-10-CM | POA: Diagnosis not present

## 2017-10-26 DIAGNOSIS — E1121 Type 2 diabetes mellitus with diabetic nephropathy: Secondary | ICD-10-CM | POA: Diagnosis not present

## 2017-11-23 ENCOUNTER — Other Ambulatory Visit: Payer: Self-pay | Admitting: Dermatology

## 2017-11-23 DIAGNOSIS — D0439 Carcinoma in situ of skin of other parts of face: Secondary | ICD-10-CM | POA: Diagnosis not present

## 2017-11-23 DIAGNOSIS — C4441 Basal cell carcinoma of skin of scalp and neck: Secondary | ICD-10-CM | POA: Diagnosis not present

## 2017-11-23 DIAGNOSIS — C4491 Basal cell carcinoma of skin, unspecified: Secondary | ICD-10-CM

## 2017-11-23 DIAGNOSIS — C44619 Basal cell carcinoma of skin of left upper limb, including shoulder: Secondary | ICD-10-CM | POA: Diagnosis not present

## 2017-11-23 DIAGNOSIS — C4492 Squamous cell carcinoma of skin, unspecified: Secondary | ICD-10-CM

## 2017-11-23 HISTORY — DX: Squamous cell carcinoma of skin, unspecified: C44.92

## 2017-11-23 HISTORY — DX: Basal cell carcinoma of skin, unspecified: C44.91

## 2017-11-25 ENCOUNTER — Other Ambulatory Visit: Payer: Self-pay

## 2017-11-25 NOTE — Patient Outreach (Signed)
[facility]) Care Management  11/25/2017  Sue [name] 1932-08-15 [id number]   Medication Adherence call to Sue [name] left a message for patient to call back patient is due on Simvastatin 20 mg under [place].   [facility] Management Direct Dial [phone]  Fax [phone] [email or web address]

## 2017-12-14 ENCOUNTER — Ambulatory Visit: Payer: Medicare Other | Admitting: Podiatry

## 2017-12-14 ENCOUNTER — Encounter: Payer: Self-pay | Admitting: Podiatry

## 2017-12-14 DIAGNOSIS — M79674 Pain in right toe(s): Secondary | ICD-10-CM | POA: Diagnosis not present

## 2017-12-14 DIAGNOSIS — L84 Corns and callosities: Secondary | ICD-10-CM

## 2017-12-14 DIAGNOSIS — B351 Tinea unguium: Secondary | ICD-10-CM | POA: Diagnosis not present

## 2017-12-14 DIAGNOSIS — M79675 Pain in left toe(s): Secondary | ICD-10-CM

## 2017-12-14 DIAGNOSIS — E1142 Type 2 diabetes mellitus with diabetic polyneuropathy: Secondary | ICD-10-CM | POA: Diagnosis not present

## 2017-12-14 NOTE — Patient Instructions (Addendum)
Diabetes and Foot Care Diabetes may cause you to have problems because of poor blood supply (circulation) to your feet and legs. This may cause the skin on your feet to become thinner, break easier, and heal more slowly. Your skin may become dry, and the skin may peel and crack. You may also have nerve damage in your legs and feet causing decreased feeling in them. You may not notice minor injuries to your feet that could lead to infections or more serious problems. Taking care of your feet is one of the most important things you can do for yourself. Follow these instructions at home:  Wear shoes at all times, even in the house. Do not go barefoot. Bare feet are easily injured.  Check your feet daily for blisters, cuts, and redness. If you cannot see the bottom of your feet, use a mirror or ask someone for help.  Wash your feet with warm water (do not use hot water) and mild soap. Then pat your feet and the areas between your toes until they are completely dry. Do not soak your feet as this can dry your skin.  Apply a moisturizing lotion or petroleum jelly (that does not contain alcohol and is unscented) to the skin on your feet and to dry, brittle toenails. Do not apply lotion between your toes.  Trim your toenails straight across. Do not dig under them or around the cuticle. File the edges of your nails with an emery board or nail file.  Do not cut corns or calluses or try to remove them with medicine.  Wear clean socks or stockings every day. Make sure they are not too tight. Do not wear knee-high stockings since they may decrease blood flow to your legs.  Wear shoes that fit properly and have enough cushioning. To break in new shoes, wear them for just a few hours a day. This prevents you from injuring your feet. Always look in your shoes before you put them on to be sure there are no objects inside.  Do not cross your legs. This may decrease the blood flow to your feet.  If you find a  minor scrape, cut, or break in the skin on your feet, keep it and the skin around it clean and dry. These areas may be cleansed with mild soap and water. Do not cleanse the area with peroxide, alcohol, or iodine.  When you remove an adhesive bandage, be sure not to damage the skin around it.  If you have a wound, look at it several times a day to make sure it is healing.  Do not use heating pads or hot water bottles. They may burn your skin. If you have lost feeling in your feet or legs, you may not know it is happening until it is too late.  Make sure your health care provider performs a complete foot exam at least annually or more often if you have foot problems. Report any cuts, sores, or bruises to your health care provider immediately. Contact a health care provider if:  You have an injury that is not healing.  You have cuts or breaks in the skin.  You have an ingrown nail.  You notice redness on your legs or feet.  You feel burning or tingling in your legs or feet.  You have pain or cramps in your legs and feet.  Your legs or feet are numb.  Your feet always feel cold. Get help right away if:  There is increasing   redness, swelling, or pain in or around a wound.  There is a red line that goes up your leg.  Pus is coming from a wound.  You develop a fever or as directed by your health care provider.  You notice a bad smell coming from an ulcer or wound. This information is not intended to replace advice given to you by your health care provider. Make sure you discuss any questions you have with your health care provider. Document Released: 01/09/2000 Document Revised: 06/19/2015 Document Reviewed: 06/20/2012 Elsevier Interactive Patient Education  2017 Elsevier Inc.  Corns and Calluses Corns are small areas of thickened skin that occur on the top, sides, or tip of a toe. They contain a cone-shaped core with a point that can press on a nerve below. This causes pain.  Calluses are areas of thickened skin that can occur anywhere on the body including hands, fingers, palms, soles of the feet, and heels.Calluses are usually larger than corns. What are the causes? Corns and calluses are caused by rubbing (friction) or pressure, such as from shoes that are too tight or do not fit properly. What increases the risk? Corns are more likely to develop in people who have toe deformities, such as hammer toes. Since calluses can occur with friction to any area of the skin, calluses are more likely to develop in people who: Work with their hands. Wear shoes that fit poorly, shoes that are too tight, or shoes that are high-heeled. Have toes deformities.  What are the signs or symptoms? Symptoms of a corn or callus include: A hard growth on the skin. Pain or tenderness under the skin. Redness and swelling. Increased discomfort while wearing tight-fitting shoes.  How is this diagnosed? Corns and calluses may be diagnosed with a medical history and physical exam. How is this treated? Corns and calluses may be treated with: Removing the cause of the friction or pressure. This may include: Changing your shoes. Wearing shoe inserts (orthotics) or other protective layers in your shoes, such as a corn pad. Wearing gloves. Medicines to help soften skin in the hardened, thickened areas. Reducing the size of the corn or callus by removing the dead layers of skin. Antibiotic medicines to treat infection. Surgery, if a toe deformity is the cause.  Follow these instructions at home: Take medicines only as directed by your health care provider. If you were prescribed an antibiotic, finish all of it even if you start to feel better. Wear shoes that fit well. Avoid wearing high-heeled shoes and shoes that are too tight or too loose. Wear any padding, protective layers, gloves, or orthotics as directed by your health care provider. Soak your hands or feet and then use a file  or pumice stone to soften your corn or callus. Do this as directed by your health care provider. Check your corn or callus every day for signs of infection. Watch for: Redness, swelling, or pain. Fluid, blood, or pus. Contact a health care provider if: Your symptoms do not improve with treatment. You have increased redness, swelling, or pain at the site of your corn or callus. You have fluid, blood, or pus coming from your corn or callus. You have new symptoms. This information is not intended to replace advice given to you by your health care provider. Make sure you discuss any questions you have with your health care provider. Document Released: 10/18/2003 Document Revised: 08/01/2015 Document Reviewed: 01/07/2014 Elsevier Interactive Patient Education  2018 Elsevier Inc.  

## 2017-12-15 ENCOUNTER — Encounter: Payer: Self-pay | Admitting: Podiatry

## 2017-12-15 NOTE — Progress Notes (Signed)
Subjective: Sue [name] presents to clinic today for preventative care. She has h/o diabetes, diabetic neuropathy. She is seen for painful, discolored, thick toenails and painful callus on bottom of right foot which interfere with activities of daily living. Pain is aggravated when wearing enclosed shoe gear. Pain is relieved with periodic professional debridement.  Daughter is present during the visit. States Mom still walks day for exercies.  Objective: Vascular Examination: Capillary refill time <3 seconds x 10 digits Dorsalis pedis and Posterior tibial pulses present b/l No digital hair x 10 digits Skin temperature gradient WNL b/l  Dermatological Examination: Skin with normal turgor, texture and tone b/l Toenails 1-5 b/l discolored, thick, dystrophic with subungual debris and pain with palpation to nailbeds due to thickness of nails. Hyperkeratotic lesion submet head 2 right foot, moderately thickened and porokeratotic. No flocculence, no edema, no erythema. There is subdermal hemorrhage present b/l. Hyperkeratotic lesion distal tip right 2nd digit.  Musculoskeletal: Muscle strength 5/5 to all LE muscle groups  Neurological: Sensation diminished with 10 gram monofilament. Vibratory sensation diminished  Assessment: 1. Painful onychomycosis toenails 1-5 b/l 2. Callus x 2  3. NIDDM with Diabetic neuropathy  Plan: 1. Continue diabetic foot care principles.  2. Toenails 1-5 b/l were debrided in length and girth without iatrogenic bleeding. 3. Hyperkeratotic lesion pared with sterile chisel blade submet head 2nd and distal tip right 2nd digit right foot 4. Patient to continue soft, supportive shoe gear 5. Patient to report any pedal injuries to medical professional  6. Follow up 3 months. Patient/POA to call should there be a concern in the interim.

## 2017-12-28 DIAGNOSIS — C44619 Basal cell carcinoma of skin of left upper limb, including shoulder: Secondary | ICD-10-CM | POA: Diagnosis not present

## 2017-12-28 DIAGNOSIS — C4441 Basal cell carcinoma of skin of scalp and neck: Secondary | ICD-10-CM | POA: Diagnosis not present

## 2017-12-28 DIAGNOSIS — D0439 Carcinoma in situ of skin of other parts of face: Secondary | ICD-10-CM | POA: Diagnosis not present

## 2018-01-11 ENCOUNTER — Encounter: Payer: Self-pay | Admitting: Podiatry

## 2018-01-11 ENCOUNTER — Ambulatory Visit: Payer: Medicare Other | Admitting: Podiatry

## 2018-01-11 ENCOUNTER — Ambulatory Visit (INDEPENDENT_AMBULATORY_CARE_PROVIDER_SITE_OTHER): Payer: Medicare Other

## 2018-01-11 VITALS — BP 128/72 | HR 84 | Resp 16

## 2018-01-11 DIAGNOSIS — E08621 Diabetes mellitus due to underlying condition with foot ulcer: Secondary | ICD-10-CM | POA: Insufficient documentation

## 2018-01-11 DIAGNOSIS — L97501 Non-pressure chronic ulcer of other part of unspecified foot limited to breakdown of skin: Secondary | ICD-10-CM

## 2018-01-11 DIAGNOSIS — L97511 Non-pressure chronic ulcer of other part of right foot limited to breakdown of skin: Secondary | ICD-10-CM

## 2018-01-11 DIAGNOSIS — L97519 Non-pressure chronic ulcer of other part of right foot with unspecified severity: Secondary | ICD-10-CM

## 2018-01-11 DIAGNOSIS — E1142 Type 2 diabetes mellitus with diabetic polyneuropathy: Secondary | ICD-10-CM

## 2018-01-11 MED ORDER — CEPHALEXIN 500 MG PO CAPS: 500.0000 mg | ORAL_CAPSULE | Freq: Four times a day (QID) | ORAL | 0 refills | Status: AC

## 2018-01-11 NOTE — Progress Notes (Signed)
Subjective:   [name] presents today with cc of painful right foot. She does have diabetic neuropathy. Patient states foot has been bothering her for about two weeks.  She usually exercises by walking two miles/day, but stopped due to foot pain.  Patient denies any fever, chills, nightsweats, nausea or vomiting.  Her daughter brought her in after noticing her callus had blood under it. Per daughter, her Mom has history of an open wound in the same area.  Medications    amLODipine (NORVASC) 10 MG tablet    amLODipine (NORVASC) 5 MG tablet    B-D ULTRAFINE III SHORT PEN 31G X 8 MM MISC     Cobalamin Combinations (B-12) [phone] MCG SUBL    empagliflozin (JARDIANCE) 25 MG TABS tablet    enalapril (VASOTEC) 10 MG tablet    enalapril (VASOTEC) 20 MG tablet    esomeprazole (NEXIUM) 20 MG capsule    glipiZIDE (GLUCOTROL) 10 MG tablet    glucose blood test strip    hydrochlorothiazide (HYDRODIURIL) 25 MG tablet    HYDROcodone-acetaminophen (NORCO) 5-325 MG tablet    insulin glargine (LANTUS) 100 UNIT/ML injection    Insulin Pen Needle (PEN NEEDLES) 32G X 4 MM MISC    Lancets (ONETOUCH ULTRASOFT) lancets    liraglutide (VICTOZA) 18 MG/3ML SOPN    liraglutide (VICTOZA) 18 MG/3ML SOPN    Multiple Vitamins-Minerals (CENTRUM SILVER 50+MEN PO)    Multiple Vitamins-Minerals (CENTRUM SILVER PO)    nortriptyline (PAMELOR) 25 MG capsule    Omega-3 Fatty Acids (FISH OIL PO)    Omega-3 Fatty Acids (FISH OIL) 1000 MG CAPS    Omeprazole (PRILOSEC PO)    omeprazole (PRILOSEC) 20 MG capsule    ONE TOUCH ULTRA TEST test strip    simvastatin (ZOCOR) 20 MG tablet    sitaGLIPtin (JANUVIA) 100 MG tablet    TRESIBA FLEXTOUCH 200 UNIT/ML SOPN     No Known Allergies   Objective:   Vitals:   01/11/18 1348  BP: 128/72  Pulse: 84  Resp: 16   Patient is afebrile.  Vascular Examination: Capillary refill time <3 seconds  x 10 digits Dorsalis pedis and Posterior tibial pulses present  b/l Digital hair x 10 digits absent Skin temperature gradient WNL b/l  Dermatological Examination: Ulceration located plantar aspect right foot submetatarsal head 2.  Measurements carried out today predebridement reveals hemorrhagic callus measuring 5.0 x 3.0 cm with flocculence noted at 3 o'clock position. There appears to be some denuded peeling skin extending distally to 1st webspace, possibly prior blister roof which has opened/dried.  There is no surrounding erythmema, no edema, no streaking or lymphangitis noted.    Postdebridement, reveals partial thickness ulceration measuring 5.0 x 3.0 x 0.1 cm. There is a mild odor. No purulence,  no probing to bone, no undermining, no active drainage.  No deep space abscess suspected.   Xray right foot: 2nd metatarsal head intact with no signs of osteolysis; no osteomyelitis.  Assessment:   1.  Diabet ulceration right foot submetatarsal head 2 2.    NIDDM with peripheral neuropathy  Plan: 1. Discussed diagnosis of ulcer right foot. Ulcer was debrided and reactive hyperkeratoses and necrotic tissue was resected to the level of bleeding or viable tissue. Ulcer was cleansed with wound cleanser. Iodosorb Gel was applied to base of wound with light dressing. 2. Xray performed right foot. 3. Surgical shoe was dispensed for right foot. 4. Patient/daughter were given written instructions on offloading and dressing change/aftercare and was  instructed to call immediately if any signs or symptoms of infection arise.  5. Prescription for cephalexin 500 mg po qid for 10 days sent to pharmacy 6. Patient is to follow up next Thursday. Patient/daughter instructed to report to emergency department with worsening appearance of ulcer/toe/foot, increased pain, foul odor, increased redness, swelling, drainage, fever, chills, nightsweats, nausea, vomiting, increased blood sugar. Patient/POA related understanding.

## 2018-01-11 NOTE — Patient Instructions (Signed)
Instructions for Wound Care  The most important step to healing a foot wound is to reduce the pressure on your foot - it is extremely important to stay off your foot as much as possible and wear the shoe/boot as instructed.  Cleanse your foot with saline wash or warm soapy water (dial antibacterial soap or similar).  Blot dry.  Apply prescribed medication to your wound and cover with gauze and a bandage.  May hold bandage in place with Coban (self sticky wrap), Ace bandage or tape.  You may find dressing supplies at your local [employer], [employer], drug store or medical supply store.  Your prescribed topical medication is :  Iodosorb Gel  Antibiotics ordered to pick up from your pharmacy is: Cephalexin 500 mg capsules. Take 1 capsule four time per day.  If you notice any foul odor, increase in pain, pus, increased swelling, red streaks or generalized redness occurring in your foot or leg-report toe emergency room.  This may be a sign of a limb or life threatening infection that will need prompt attention.  Sue [name],  [place] [facility]

## 2018-01-20 ENCOUNTER — Ambulatory Visit: Payer: Medicare Other | Admitting: Podiatry

## 2018-01-20 DIAGNOSIS — L97511 Non-pressure chronic ulcer of other part of right foot limited to breakdown of skin: Secondary | ICD-10-CM | POA: Diagnosis not present

## 2018-01-20 DIAGNOSIS — E08621 Diabetes mellitus due to underlying condition with foot ulcer: Secondary | ICD-10-CM | POA: Diagnosis not present

## 2018-01-20 DIAGNOSIS — E1142 Type 2 diabetes mellitus with diabetic polyneuropathy: Secondary | ICD-10-CM | POA: Diagnosis not present

## 2018-01-20 NOTE — Patient Instructions (Signed)
Diabetes Mellitus and Foot Care  Foot care is an important part of your health, especially when you have diabetes. Diabetes may cause you to have problems because of poor blood flow (circulation) to your feet and legs, which can cause your skin to:   Become thinner and drier.   Break more easily.   Heal more slowly.   Peel and crack.  You may also have nerve damage (neuropathy) in your legs and feet, causing decreased feeling in them. This means that you may not notice minor injuries to your feet that could lead to more serious problems. Noticing and addressing any potential problems early is the best way to prevent future foot problems.  How to care for your feet  Foot hygiene   Wash your feet daily with warm water and mild soap. Do not use hot water. Then, pat your feet and the areas between your toes until they are completely dry. Do not soak your feet as this can dry your skin.   Trim your toenails straight across. Do not dig under them or around the cuticle. File the edges of your nails with an emery board or nail file.   Apply a moisturizing lotion or petroleum jelly to the skin on your feet and to dry, brittle toenails. Use lotion that does not contain alcohol and is unscented. Do not apply lotion between your toes.  Shoes and socks   Wear clean socks or stockings every day. Make sure they are not too tight. Do not wear knee-high stockings since they may decrease blood flow to your legs.   Wear shoes that fit properly and have enough cushioning. Always look in your shoes before you put them on to be sure there are no objects inside.   To break in new shoes, wear them for just a few hours a day. This prevents injuries on your feet.  Wounds, scrapes, corns, and calluses   Check your feet daily for blisters, cuts, bruises, sores, and redness. If you cannot see the bottom of your feet, use a mirror or ask someone for help.   Do not cut corns or calluses or try to remove them with medicine.   If you  find a minor scrape, cut, or break in the skin on your feet, keep it and the skin around it clean and dry. You may clean these areas with mild soap and water. Do not clean the area with peroxide, alcohol, or iodine.   If you have a wound, scrape, corn, or callus on your foot, look at it several times a day to make sure it is healing and not infected. Check for:  ? Redness, swelling, or pain.  ? Fluid or blood.  ? Warmth.  ? Pus or a bad smell.  General instructions   Do not cross your legs. This may decrease blood flow to your feet.   Do not use heating pads or hot water bottles on your feet. They may burn your skin. If you have lost feeling in your feet or legs, you may not know this is happening until it is too late.   Protect your feet from hot and cold by wearing shoes, such as at the beach or on hot pavement.   Schedule a complete foot exam at least once a year (annually) or more often if you have foot problems. If you have foot problems, report any cuts, sores, or bruises to your health care provider immediately.  Contact a health care provider if:     You have a medical condition that increases your risk of infection and you have any cuts, sores, or bruises on your feet.   You have an injury that is not healing.   You have redness on your legs or feet.   You feel burning or tingling in your legs or feet.   You have pain or cramps in your legs and feet.   Your legs or feet are numb.   Your feet always feel cold.   You have pain around a toenail.  Get help right away if:   You have a wound, scrape, corn, or callus on your foot and:  ? You have pain, swelling, or redness that gets worse.  ? You have fluid or blood coming from the wound, scrape, corn, or callus.  ? Your wound, scrape, corn, or callus feels warm to the touch.  ? You have pus or a bad smell coming from the wound, scrape, corn, or callus.  ? You have a fever.  ? You have a red line going up your leg.  Summary   Check your feet every day  for cuts, sores, red spots, swelling, and blisters.   Moisturize feet and legs daily.   Wear shoes that fit properly and have enough cushioning.   If you have foot problems, report any cuts, sores, or bruises to your health care provider immediately.   Schedule a complete foot exam at least once a year (annually) or more often if you have foot problems.  This information is not intended to replace advice given to you by your health care provider. Make sure you discuss any questions you have with your health care provider.  Document Released: 01/09/2000 Document Revised: 02/23/2017 Document Reviewed: 02/13/2016  [employer]vier Interactive Patient Education  2019 [employer]

## 2018-01-27 ENCOUNTER — Ambulatory Visit: Payer: Medicare Other | Admitting: Podiatry

## 2018-01-27 DIAGNOSIS — L97511 Non-pressure chronic ulcer of other part of right foot limited to breakdown of skin: Secondary | ICD-10-CM | POA: Diagnosis not present

## 2018-01-27 DIAGNOSIS — E08621 Diabetes mellitus due to underlying condition with foot ulcer: Secondary | ICD-10-CM

## 2018-01-27 DIAGNOSIS — E1142 Type 2 diabetes mellitus with diabetic polyneuropathy: Secondary | ICD-10-CM

## 2018-01-27 NOTE — Patient Instructions (Signed)
Diabetes Mellitus and Foot Care  Foot care is an important part of your health, especially when you have diabetes. Diabetes may cause you to have problems because of poor blood flow (circulation) to your feet and legs, which can cause your skin to:   Become thinner and drier.   Break more easily.   Heal more slowly.   Peel and crack.  You may also have nerve damage (neuropathy) in your legs and feet, causing decreased feeling in them. This means that you may not notice minor injuries to your feet that could lead to more serious problems. Noticing and addressing any potential problems early is the best way to prevent future foot problems.  How to care for your feet  Foot hygiene   Wash your feet daily with warm water and mild soap. Do not use hot water. Then, pat your feet and the areas between your toes until they are completely dry. Do not soak your feet as this can dry your skin.   Trim your toenails straight across. Do not dig under them or around the cuticle. File the edges of your nails with an emery board or nail file.   Apply a moisturizing lotion or petroleum jelly to the skin on your feet and to dry, brittle toenails. Use lotion that does not contain alcohol and is unscented. Do not apply lotion between your toes.  Shoes and socks   Wear clean socks or stockings every day. Make sure they are not too tight. Do not wear knee-high stockings since they may decrease blood flow to your legs.   Wear shoes that fit properly and have enough cushioning. Always look in your shoes before you put them on to be sure there are no objects inside.   To break in new shoes, wear them for just a few hours a day. This prevents injuries on your feet.  Wounds, scrapes, corns, and calluses   Check your feet daily for blisters, cuts, bruises, sores, and redness. If you cannot see the bottom of your feet, use a mirror or ask someone for help.   Do not cut corns or calluses or try to remove them with medicine.   If you  find a minor scrape, cut, or break in the skin on your feet, keep it and the skin around it clean and dry. You may clean these areas with mild soap and water. Do not clean the area with peroxide, alcohol, or iodine.   If you have a wound, scrape, corn, or callus on your foot, look at it several times a day to make sure it is healing and not infected. Check for:  ? Redness, swelling, or pain.  ? Fluid or blood.  ? Warmth.  ? Pus or a bad smell.  General instructions   Do not cross your legs. This may decrease blood flow to your feet.   Do not use heating pads or hot water bottles on your feet. They may burn your skin. If you have lost feeling in your feet or legs, you may not know this is happening until it is too late.   Protect your feet from hot and cold by wearing shoes, such as at the beach or on hot pavement.   Schedule a complete foot exam at least once a year (annually) or more often if you have foot problems. If you have foot problems, report any cuts, sores, or bruises to your health care provider immediately.  Contact a health care provider if:     You have a medical condition that increases your risk of infection and you have any cuts, sores, or bruises on your feet.   You have an injury that is not healing.   You have redness on your legs or feet.   You feel burning or tingling in your legs or feet.   You have pain or cramps in your legs and feet.   Your legs or feet are numb.   Your feet always feel cold.   You have pain around a toenail.  Get help right away if:   You have a wound, scrape, corn, or callus on your foot and:  ? You have pain, swelling, or redness that gets worse.  ? You have fluid or blood coming from the wound, scrape, corn, or callus.  ? Your wound, scrape, corn, or callus feels warm to the touch.  ? You have pus or a bad smell coming from the wound, scrape, corn, or callus.  ? You have a fever.  ? You have a red line going up your leg.  Summary   Check your feet every day  for cuts, sores, red spots, swelling, and blisters.   Moisturize feet and legs daily.   Wear shoes that fit properly and have enough cushioning.   If you have foot problems, report any cuts, sores, or bruises to your health care provider immediately.   Schedule a complete foot exam at least once a year (annually) or more often if you have foot problems.  This information is not intended to replace advice given to you by your health care provider. Make sure you discuss any questions you have with your health care provider.  Document Released: 01/09/2000 Document Revised: 02/23/2017 Document Reviewed: 02/13/2016  [employer]vier Interactive Patient Education  2019 [employer]

## 2018-02-07 ENCOUNTER — Ambulatory Visit: Payer: Medicare Other | Admitting: Orthotics

## 2018-02-07 DIAGNOSIS — L84 Corns and callosities: Secondary | ICD-10-CM

## 2018-02-07 DIAGNOSIS — E08621 Diabetes mellitus due to underlying condition with foot ulcer: Secondary | ICD-10-CM

## 2018-02-07 DIAGNOSIS — E1142 Type 2 diabetes mellitus with diabetic polyneuropathy: Secondary | ICD-10-CM

## 2018-02-07 DIAGNOSIS — L97511 Non-pressure chronic ulcer of other part of right foot limited to breakdown of skin: Principal | ICD-10-CM

## 2018-02-07 DIAGNOSIS — L97519 Non-pressure chronic ulcer of other part of right foot with unspecified severity: Secondary | ICD-10-CM

## 2018-02-07 NOTE — Progress Notes (Signed)

## 2018-02-14 ENCOUNTER — Encounter (INDEPENDENT_AMBULATORY_CARE_PROVIDER_SITE_OTHER): Payer: Medicare Other | Admitting: Ophthalmology

## 2018-02-14 DIAGNOSIS — H353112 Nonexudative age-related macular degeneration, right eye, intermediate dry stage: Secondary | ICD-10-CM | POA: Diagnosis not present

## 2018-02-14 DIAGNOSIS — H353221 Exudative age-related macular degeneration, left eye, with active choroidal neovascularization: Secondary | ICD-10-CM | POA: Diagnosis not present

## 2018-02-14 DIAGNOSIS — E119 Type 2 diabetes mellitus without complications: Secondary | ICD-10-CM | POA: Diagnosis not present

## 2018-02-14 DIAGNOSIS — Z961 Presence of intraocular lens: Secondary | ICD-10-CM | POA: Diagnosis not present

## 2018-02-18 ENCOUNTER — Encounter: Payer: Self-pay | Admitting: Podiatry

## 2018-02-18 NOTE — Progress Notes (Signed)
Subjective:   [name] presents today for follow-up of ulceration of the right foot.  Patient states right foot feels much better.  She has been wearing a Darco shoe and applying medication as instructed.  She states she still has a few of the antibiotics left.  No Known Allergies   Objective:   Ulceration located plantar aspect right foot submetatarsal head 2.  Area is completely epithelialized on today.  No flocculence noted.  No erythema, no edema, no streaking or lymphangitis noted.  No probing to bone, no undermining, no active pus or purulence noted.  No deep abscess, no erythema, no edema, no cellulitis, no odor was encountered.    Assessment:   1.  Diabetic ulceration submetatarsal head right foot, healed 2.  NIDDM with peripheral neuropathy  Plan: 1. Reactive hyperkeratosis was pared revealing no underlying residual ulceration. . 2. No indication for new x-rays today as ulcer is healed.   3. I did ask her to continue wearing her surgical shoe at home an additional week.  She may wear soft supportive shoe gear if she is going out of the home.  4. Finish all antibiotics until gone. 5. Patient is to follow up in one week for final check of her ulceration.  I would then refer her to our orthotics and prosthetics department to be casted for her diabetic shoes. 6. Patient instructed to report to emergency department with worsening appearance of ulcer/toe/foot, increased pain, foul odor, increased redness, swelling, drainage, fever, chills, nightsweats, nausea, vomiting, increased blood sugar. Patient/POA related understanding.

## 2018-02-19 ENCOUNTER — Encounter: Payer: Self-pay | Admitting: Podiatry

## 2018-02-19 NOTE — Progress Notes (Signed)
Subjective:   Sue [name] presents today for follow-up of ulceration of the right foot.  She has completed all of her antibiotics and states her foot continues to feel fine. She is still wearing her Darco shoe at home.  No Known Allergies   Objective:   Area of previous ulceration located plantar aspect right foot submetatarsal head 2 is completely healed on today.  No pain on palpation. No flocculence noted.  No erythema, no edema, no streaking or lymphangitis noted.  No probing to bone, no undermining, no active pus or purulence noted.   Assessment:   1.  Diabetic ulceration submetatarsal head 2 right foot, remains healed 2.  NIDDM with peripheral neuropathy  Plan: 1. She may now be molded for diabetic shoes. She will see [employer] for custom molding of her insoles. 2. She may wear her regular shoes 1 hr/day (e.g., at church, grocery store, errand). When she returns home, she is to wear her Darco shoe. 3. No indication for new x-rays today as ulcer is healed.   4. Follow up after she receives her diabetic shoes.

## 2018-02-21 ENCOUNTER — Ambulatory Visit: Payer: Medicare Other

## 2018-02-21 ENCOUNTER — Ambulatory Visit: Payer: Medicare Other | Admitting: Podiatry

## 2018-02-21 DIAGNOSIS — L03031 Cellulitis of right toe: Secondary | ICD-10-CM | POA: Diagnosis not present

## 2018-02-21 DIAGNOSIS — L02611 Cutaneous abscess of right foot: Secondary | ICD-10-CM | POA: Diagnosis not present

## 2018-02-21 MED ORDER — CEPHALEXIN 500 MG PO CAPS: 500.0000 mg | ORAL_CAPSULE | Freq: Four times a day (QID) | ORAL | 0 refills | Status: AC

## 2018-02-21 NOTE — Patient Instructions (Signed)
Diabetes Mellitus and Foot Care  Foot care is an important part of your health, especially when you have diabetes. Diabetes may cause you to have problems because of poor blood flow (circulation) to your feet and legs, which can cause your skin to:   Become thinner and drier.   Break more easily.   Heal more slowly.   Peel and crack.  You may also have nerve damage (neuropathy) in your legs and feet, causing decreased feeling in them. This means that you may not notice minor injuries to your feet that could lead to more serious problems. Noticing and addressing any potential problems early is the best way to prevent future foot problems.  How to care for your feet  Foot hygiene   Wash your feet daily with warm water and mild soap. Do not use hot water. Then, pat your feet and the areas between your toes until they are completely dry. Do not soak your feet as this can dry your skin.   Trim your toenails straight across. Do not dig under them or around the cuticle. File the edges of your nails with an emery board or nail file.   Apply a moisturizing lotion or petroleum jelly to the skin on your feet and to dry, brittle toenails. Use lotion that does not contain alcohol and is unscented. Do not apply lotion between your toes.  Shoes and socks   Wear clean socks or stockings every day. Make sure they are not too tight. Do not wear knee-high stockings since they may decrease blood flow to your legs.   Wear shoes that fit properly and have enough cushioning. Always look in your shoes before you put them on to be sure there are no objects inside.   To break in new shoes, wear them for just a few hours a day. This prevents injuries on your feet.  Wounds, scrapes, corns, and calluses   Check your feet daily for blisters, cuts, bruises, sores, and redness. If you cannot see the bottom of your feet, use a mirror or ask someone for help.   Do not cut corns or calluses or try to remove them with medicine.   If you  find a minor scrape, cut, or break in the skin on your feet, keep it and the skin around it clean and dry. You may clean these areas with mild soap and water. Do not clean the area with peroxide, alcohol, or iodine.   If you have a wound, scrape, corn, or callus on your foot, look at it several times a day to make sure it is healing and not infected. Check for:  ? Redness, swelling, or pain.  ? Fluid or blood.  ? Warmth.  ? Pus or a bad smell.  General instructions   Do not cross your legs. This may decrease blood flow to your feet.   Do not use heating pads or hot water bottles on your feet. They may burn your skin. If you have lost feeling in your feet or legs, you may not know this is happening until it is too late.   Protect your feet from hot and cold by wearing shoes, such as at the beach or on hot pavement.   Schedule a complete foot exam at least once a year (annually) or more often if you have foot problems. If you have foot problems, report any cuts, sores, or bruises to your health care provider immediately.  Contact a health care provider if:     You have a medical condition that increases your risk of infection and you have any cuts, sores, or bruises on your feet.   You have an injury that is not healing.   You have redness on your legs or feet.   You feel burning or tingling in your legs or feet.   You have pain or cramps in your legs and feet.   Your legs or feet are numb.   Your feet always feel cold.   You have pain around a toenail.  Get help right away if:   You have a wound, scrape, corn, or callus on your foot and:  ? You have pain, swelling, or redness that gets worse.  ? You have fluid or blood coming from the wound, scrape, corn, or callus.  ? Your wound, scrape, corn, or callus feels warm to the touch.  ? You have pus or a bad smell coming from the wound, scrape, corn, or callus.  ? You have a fever.  ? You have a red line going up your leg.  Summary   Check your feet every day  for cuts, sores, red spots, swelling, and blisters.   Moisturize feet and legs daily.   Wear shoes that fit properly and have enough cushioning.   If you have foot problems, report any cuts, sores, or bruises to your health care provider immediately.   Schedule a complete foot exam at least once a year (annually) or more often if you have foot problems.  This information is not intended to replace advice given to you by your health care provider. Make sure you discuss any questions you have with your health care provider.  Document Released: 01/09/2000 Document Revised: 02/23/2017 Document Reviewed: 02/13/2016  [employer]vier Interactive Patient Education  2019 [employer]

## 2018-02-23 ENCOUNTER — Encounter: Payer: Self-pay | Admitting: Podiatry

## 2018-02-23 NOTE — Progress Notes (Signed)
Subjective:    Patient ID: Sue [name], female    DOB: Jun 12, 1932, 85 y.o.   MRN: [id number]  HPI Subjective:   [name] is accompanied by her daughter on today's visit.  She presents today for chief concern of swollen right second digit.  She also has a concern peeling skin on the bottom of her right foot. She denies any drainage or bleeding from either area. She relates in the previous night both of her legs were swollen but resolved by morning.  She denies any fever, chills, night sweats, nausea or vomiting.       She is awaiting the arrival of her diabetic shoes which should be in the next 1 to 2 weeks.    No Known Allergies    Review of Systems  Constitutional: Negative for chills, diaphoresis and fever.  Gastrointestinal: Negative for nausea and vomiting.  Skin: Positive for color change. Negative for wound.  All other systems reviewed and are negative.      Objective:   Physical Exam  Vascular Examination: Capillary refill time <3 seconds x 10 digits Dorsalis pedis and Posterior tibial pulses present b/l No digital hair x 10 digits Skin temperature reveals warmth on right lower extremity forefoot area when compared to left. There is no tenderness with calf compression bilaterally.  No calf swelling bilaterally. There is no edema present today bilateral feet or legs.  Dermatological Examination: Skin with normal turgor, texture and tone b/l  Toenails 1-5 b/l discolored, thick, dystrophic with subungual debris and pain with palpation to nailbeds due to thickness of nails.  Area of previous ulceration located plantar aspect right foot submetatarsal head 2 remains completely healed on today. Hyperkeratotic lesion  mildly thickened with a layer peeling skin.  There is no subdermal hemorrhage present.  Minimal pain on palpation. No flocculence noted.  No erythema, no edema, no streaking or lymphangitis noted.  No probing to bone, no undermining, no active pus or  purulence noted.  There is no sign of deep space abscess noted  Hyperkeratotic lesion distal tip right 2nd digit.  This digit is rigidly contracted at the DIPJ.  There is erythema noted at the distal interphalangeal joint.  There is no flocculence noted.  No impending wound.  There is tenderness to palpation.  Musculoskeletal: Muscle strength 5/5 to all LE muscle groups  Rigidly contracted hammertoe right second digit.  X-rays right foot reveal no cortical destruction of bones of the left second digit or metatarsal.    Assessment & Plan:  Assessment:   1.  Pre-ulcerative lesion submetatarsal head 2 right foot patient with history of diabetic ulceration submetatarsal head 2 right foot, remains healed  2.  Cellulitis right second digit  Plan: 1. Hyperkeratotic lesion pared distal tip right 2nd digit right foot and submetatarsal head right foot. 2. Start cephalexin 500 mg p.o. 4 times daily for 7 days. 3. X-rays taken on today right foot. 4. Patient to report any pedal injuries to medical professional immediately. 5. She may wear her regular shoes 1 hr/day (e.g., at church, grocery store, errand). When she returns home, she is to continue to wear her Darco shoe. 6. She has an appointment on 19 February and she is to keep that appointment.  If her shoes come within the next week I will see her on that day as well. 7. If she notices any changes any worsening symptoms of her right second toe or foot call the office immediately.  If she  experiences, fever, chills, night sweats, nausea, vomiting with changes in her foot report to the emergency room immediately.  We also discussed signs and symptoms of DVT.  Should she experience any calf pain or swelling she is to report to the emergency room for evaluation.  Patient and daughter related understanding.

## 2018-02-27 NOTE — Progress Notes (Signed)
[facility] Note  03/01/2018     CHIEF COMPLAINT Patient presents for Retina Follow Up   HISTORY OF PRESENT ILLNESS: Sue [name] is a 85 y.o. female who presents to the clinic today for:   HPI    Retina Follow Up    Patient presents with  Dry AMD.  In both eyes.  Severity is moderate.  Duration of 6.5 months.  Since onset it is stable.  I, the attending physician,  performed the HPI with the patient and updated documentation appropriately.          Comments    Patient states vision the same OU. Last a1c was around 7.        Last edited by Bernarda Caffey, MD on 03/01/2018  1:42 PM. (History)     Patient denies any vision changes  Referring physician: Merrilee Seashore, MD [address] [place], [place] [address]  HISTORICAL INFORMATION:   Selected notes from the MEDICAL RECORD NUMBER Referred by Dr. Quentin Ore for concern of ARMD OU LEE: 01.17.19 Read Drivers) [BCVA: OD: 20/30-1 OS: 20/40] Ocular Hx-floaters, ARMD OU, HTN ret OU, PVD OS, dry eye syndrome OU, Pseudophakia OU, pupillary miosis OU PMH-DM (taking Lantus, Januvia and Jardiance), HTN    CURRENT MEDICATIONS: No current outpatient medications on file. (Ophthalmic Drugs)   No current facility-administered medications for this visit.  (Ophthalmic Drugs)   Current Outpatient Medications (Other)  Medication Sig  . amLODipine (NORVASC) 10 MG tablet   . amLODipine (NORVASC) 5 MG tablet Take 5 mg by mouth daily.  . B-D ULTRAFINE III SHORT PEN 31G X 8 MM MISC USE AS DIRECTED ONCE DAILY SUBCUTANEOUSLY  . Cobalamin Combinations (B-12) [phone] MCG SUBL B-12  . empagliflozin (JARDIANCE) 25 MG TABS tablet Take 25 mg by mouth daily.  . enalapril (VASOTEC) 10 MG tablet Take 20 mg by mouth 2 (two) times daily.  . enalapril (VASOTEC) 20 MG tablet   . esomeprazole (NEXIUM) 20 MG capsule Take 20 mg by mouth daily.  Marland Kitchen glipiZIDE (GLUCOTROL) 10 MG tablet Take 10 mg by mouth 2 (two)  times daily before a meal.  . glucose blood test strip OneTouch Ultra Test strips  TEST ONCE A DAY ONCE A DAY FINGERSTICK 90  . hydrochlorothiazide (HYDRODIURIL) 25 MG tablet   . HYDROcodone-acetaminophen (NORCO) 5-325 MG tablet Take 1 tablet by mouth every 6 (six) hours as needed for moderate pain.  Marland Kitchen insulin glargine (LANTUS) 100 UNIT/ML injection Inject into the skin at bedtime.  . Insulin Pen Needle (PEN NEEDLES) 32G X 4 MM MISC BD Ultra-Fine Nano Pen Needle 32 gauge x 5/32"  USE AS DIRECTED DAILY  . ketoconazole (NIZORAL) 2 % cream 1 APPLICATION TWICE A DAY EXTERNALLY 21 DAYS  . Lancets (ONETOUCH ULTRASOFT) lancets OneTouch UltraSoft Lancets  USE TWICE DAILY  . liraglutide (VICTOZA) 18 MG/3ML SOPN Victoza 3-Pak 0.6 mg/0.1 mL (18 mg/3 mL) subcutaneous pen injector  INJECT 1.8 MG DAILY  . liraglutide (VICTOZA) 18 MG/3ML SOPN Victoza  . Multiple Vitamins-Minerals (CENTRUM SILVER 50+MEN PO) Centrum Silver  . Multiple Vitamins-Minerals (CENTRUM SILVER PO) Take by mouth.  . nortriptyline (PAMELOR) 25 MG capsule Take 25 mg by mouth at bedtime.  . Omega-3 Fatty Acids (FISH OIL PO) Take by mouth.  . Omega-3 Fatty Acids (FISH OIL) 1000 MG CAPS Fish Oil  . Omeprazole (PRILOSEC PO) omeprazole  . omeprazole (PRILOSEC) 20 MG capsule   . ONE TOUCH ULTRA TEST test strip   .  simvastatin (ZOCOR) 20 MG tablet Take 20 mg by mouth daily.  . sitaGLIPtin (JANUVIA) 100 MG tablet Take 100 mg by mouth daily.  Marland Kitchen sulfamethoxazole-trimethoprim (BACTRIM DS,SEPTRA DS) 800-160 MG tablet Take 1 tablet by mouth 2 (two) times daily.  . TRESIBA FLEXTOUCH 200 UNIT/ML SOPN 22 UNITS ONCE A DAY IN THE EVENING   Current Facility-Administered Medications (Other)  Medication Route  . Bevacizumab (AVASTIN) SOLN 1.25 mg Intravitreal      REVIEW OF SYSTEMS: ROS    Positive for: Endocrine, Eyes   Negative for: Constitutional, Gastrointestinal, Neurological, Skin, Genitourinary, Musculoskeletal, HENT, Cardiovascular,  Respiratory, Psychiatric, Allergic/Imm, Heme/Lymph   Last edited by Roselee Nova D on 03/01/2018 10:03 AM. (History)       ALLERGIES No Known Allergies  PAST MEDICAL HISTORY Past Medical History:  Diagnosis Date  . Diabetes mellitus without complication ([place])   . Hypertension    Past Surgical History:  Procedure Laterality Date  . ABDOMINAL HYSTERECTOMY    . CATARACT EXTRACTION    . CHOLECYSTECTOMY    . EYE SURGERY    . SPINE SURGERY      FAMILY HISTORY History reviewed. No pertinent family history.  SOCIAL HISTORY Social History   Tobacco Use  . Smoking status: Former Research scientist (life sciences)  . Smokeless tobacco: Never Used  Substance Use Topics  . Alcohol use: No    Alcohol/week: 0.0 standard drinks  . Drug use: No         OPHTHALMIC EXAM:  Base Eye Exam    Visual Acuity (Snellen - Linear)      Right Left   Dist cc 20/30 20/30 -2   Dist ph cc NI NI       Tonometry (Tonopen, 10:19 AM)      Right Left   Pressure 17 17       Pupils      Dark Light Shape React APD   Right 4 3 Round Brisk None   Left 4 3 Round Brisk None       Visual Fields (Counting fingers)      Left Right    Full Full       Extraocular Movement      Right Left    Full, Ortho Full, Ortho       Neuro/Psych    Oriented x3:  Yes   Mood/Affect:  Normal       Dilation    Both eyes:  1.0% Mydriacyl, 2.5% Phenylephrine @ 10:19 AM        Slit Lamp and Fundus Exam    Slit Lamp Exam      Right Left   Lids/Lashes Dermatochalasis - upper lid, Telangiectasia, mild MGD Dermatochalasis - upper lid, Telangiectasia, Meibomian gland dysfunction   Conjunctiva/Sclera White and quiet White and quiet   Cornea Temporal Well healed cataract wounds, mild Arcus, trace endo pigment nasally, 1+PEE Arcus, Temporal Well healed cataract wounds, 1-2+ PEE   Anterior Chamber Deep and quiet Deep and quiet   Iris Round and moderately dilated to 4.97m Round and moderately dilated to 571m Iris atrophy from 0100 to  0230, peripheral irodotimy at 0200   Lens 3-piece PC IOL in good position PC IOL in good position   Vitreous Vitreous syneresis, Posterior vitreous detachment, Weiss ring Vitreous syneresis, PVD       Fundus Exam      Right Left   Disc 360 Peripapillary atrophy, diffuse mild pallor Pink and Sharp, Peripapillary atrophy and pigmentation   C/D Ratio 0.3 0.3  Macula Blunted foveal reflex, +PED, RPE mottling and clumping, Drusen, No heme or edema Blunted foveal reflex, +PED, RPE mottling and clumping, Drusen, No heme or edema, +CNVM with overlying, shallow SRF   Vessels Vascular attenuation Vascular attenuation   Periphery Attached, 360 Reticular degeneration Attached, 360 Reticular degeneration        Refraction    Wearing Rx      Sphere Cylinder Axis   Right -1.50 +0.75 180   Left -0.50 +0.75 180       Manifest Refraction      Sphere Cylinder Axis Dist VA   Right -1.00 +0.75 160 20/30+1   Left [place] +0.75 177 20/30-2          IMAGING AND PROCEDURES  Imaging and Procedures for _0 @  OCT, Retina - OU - Both Eyes       Right Eye Quality was good. Central Foveal Thickness: 235. Progression has been stable. Findings include normal foveal contour, no IRF, no SRF, pigment epithelial detachment, outer retinal atrophy, retinal drusen .   Left Eye Quality was good. Central Foveal Thickness: 274. Progression has worsened. Findings include no IRF, retinal drusen , pigment epithelial detachment, outer retinal atrophy, abnormal foveal contour, epiretinal membrane, subretinal fluid (Interval development of SRF over-lying PED).   Notes *Images captured and stored on drive  Diagnosis / Impression:  OD: Non-Exudative ARMD OS: Exudative ARMD -- interval conversion from non-exudative  Clinical management:  See below  Abbreviations: NFP - Normal foveal profile. CME - cystoid macular edema. PED - pigment epithelial detachment. IRF - intraretinal fluid. SRF - subretinal fluid. EZ -  ellipsoid zone. ERM - epiretinal membrane. ORA - outer retinal atrophy. ORT - outer retinal tubulation. SRHM - subretinal hyper-reflective material         Intravitreal Injection, Pharmacologic Agent - OS - Left Eye       Time Out 03/01/2018. 11:39 AM. Confirmed correct patient, procedure, site, and patient consented.   Anesthesia Topical anesthesia was used. Anesthetic medications included Lidocaine 2%, Proparacaine 0.5%.   Procedure Preparation included 5% betadine to ocular surface, eyelid speculum. A 30 gauge needle was used.   Injection:  1.25 mg Bevacizumab (AVASTIN) SOLN   NDC: 94765-465-03, Lot: [id number] , Expiration date: 03/07/2018   Route: Intravitreal, Site: Left Eye, Waste: 0 mL  Post-op Post injection exam found visual acuity of at least counting fingers. The patient tolerated the procedure well. There were no complications. The patient received written and verbal post procedure care education.                 ASSESSMENT/PLAN:    ICD-10-CM   1. Exudative age-related macular degeneration of left eye with active choroidal neovascularization (HCC) H35.3221 Intravitreal Injection, Pharmacologic Agent - OS - Left Eye    Bevacizumab (AVASTIN) SOLN 1.25 mg  2. 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 Diabetes mellitus type 2 without retinopathy ([place]) E11.9   4. Hypertensive retinopathy of both eyes H35.033   5. Essential hypertension I10   6. Retinal edema H35.81 OCT, Retina - OU - Both Eyes  7. Pseudophakia of both eyes Z96.1    1. Exudative age-related macular degeneration, left eye  - interval conversion of OS from nonexudative to exudative ARMD  - OCT shows interval development of SRF overlying PED  - BCVA remains stable at 20/30 -2  - The incidence pathology and anatomy of wet AMD discussed   - The ANCHOR, MARINA, CATT and VIEW trials discussed  with patient.    - discussed treatment options including  observation vs intravitreal anti-VEGF agents such as Avastin, Lucentis, Eylea.    - Risks of endophthalmitis and vascular occlusive events and atrophic changes discussed with patient  - recommend IVA #1 OS today 2.5.2020  - risks and benefits of intravitreal avastin discussed with patient  - pt wishes to be treated with IVA  - f/u in 4 wks for DFE, OCT, likely injection   2. Age related macular degeneration, non-exudative, right eye  - stable, former pt of Dr. Zigmund Daniel -- no significant change in OCT or exam  - The incidence, anatomy, and pathology of dry AMD, risk of progression, and the AREDS and AREDS 2 study including smoking risks discussed with patient.  - Recommend amsler grid monitoring  3. Diabetes mellitus, type 2 without retinopathy - The incidence, risk factors for progression, natural history and treatment options for diabetic retinopathy  were discussed with patient.   - The need for close monitoring of blood glucose, blood pressure, and serum lipids, avoiding cigarette or any type of tobacco, and the need for long term follow up was also discussed with patient.  4,5. Hypertensive retinopathy OU - discussed importance of tight BP control - monitor  6. No retinal edema on exam or OCT  7. Pseudophakia OU  - s/p CE/IOL  - beautiful surgery, doing well  - monitor   Ophthalmic Meds Ordered this visit:  Meds ordered this encounter  Medications  . Bevacizumab (AVASTIN) SOLN 1.25 mg       Return 4 weeks, for DFE, OCT.  There are no Patient Instructions on file for this visit.   Explained the diagnoses, plan, and follow up with the patient and they expressed understanding.  Patient expressed understanding of the importance of proper follow up care.   This document serves as a record of services personally performed by Gardiner Sleeper, MD, PhD. It was created on their behalf by Ernest Mallick, OA, an ophthalmic assistant. The creation of this record is the provider's  dictation and/or activities during the visit.    Electronically signed by: Ernest Mallick, OA  02.03.2020 10:18 PM    Gardiner Sleeper, M.D., Ph.D. Diseases & Surgery of the Retina and Vitreous [facility]  I have reviewed the above documentation for accuracy and completeness, and I agree with the above. Gardiner Sleeper, M.D., Ph.D. 03/05/18 10:25 PM   Abbreviations: M myopia (nearsighted); A astigmatism; H hyperopia (farsighted); P presbyopia; Mrx spectacle prescription;  CTL contact lenses; OD right eye; OS left eye; OU both eyes  XT exotropia; ET esotropia; PEK punctate epithelial keratitis; PEE punctate epithelial erosions; DES dry eye syndrome; MGD meibomian gland dysfunction; ATs artificial tears; PFAT's preservative free artificial tears; [place] nuclear sclerotic cataract; PSC posterior subcapsular cataract; ERM epi-retinal membrane; PVD posterior vitreous detachment; RD retinal detachment; DM diabetes mellitus; DR diabetic retinopathy; NPDR non-proliferative diabetic retinopathy; PDR proliferative diabetic retinopathy; CSME clinically significant macular edema; DME diabetic macular edema; dbh dot blot hemorrhages; CWS cotton wool spot; POAG primary open angle glaucoma; C/D cup-to-disc ratio; HVF humphrey visual field; GVF goldmann visual field; OCT optical coherence tomography; IOP intraocular pressure; BRVO Branch retinal vein occlusion; CRVO central retinal vein occlusion; CRAO central retinal artery occlusion; BRAO branch retinal artery occlusion; RT retinal tear; SB scleral buckle; PPV pars plana vitrectomy; VH Vitreous hemorrhage; PRP panretinal laser photocoagulation; IVK intravitreal kenalog; VMT vitreomacular traction; MH Macular hole;  NVD neovascularization of the disc; NVE  neovascularization elsewhere; AREDS age related eye disease study; ARMD age related macular degeneration; POAG primary open angle glaucoma; EBMD epithelial/anterior basement membrane dystrophy;  ACIOL anterior chamber intraocular lens; IOL intraocular lens; PCIOL posterior chamber intraocular lens; Phaco/IOL phacoemulsification with intraocular lens placement; [place] photorefractive keratectomy; LASIK laser assisted in situ keratomileusis; HTN hypertension; DM diabetes mellitus; COPD chronic obstructive pulmonary disease

## 2018-03-01 ENCOUNTER — Ambulatory Visit (INDEPENDENT_AMBULATORY_CARE_PROVIDER_SITE_OTHER): Payer: Medicare Other | Admitting: Ophthalmology

## 2018-03-01 ENCOUNTER — Encounter (INDEPENDENT_AMBULATORY_CARE_PROVIDER_SITE_OTHER): Payer: Self-pay | Admitting: Ophthalmology

## 2018-03-01 DIAGNOSIS — H3581 Retinal edema: Secondary | ICD-10-CM | POA: Diagnosis not present

## 2018-03-01 DIAGNOSIS — H35033 Hypertensive retinopathy, bilateral: Secondary | ICD-10-CM

## 2018-03-01 DIAGNOSIS — Z961 Presence of intraocular lens: Secondary | ICD-10-CM

## 2018-03-01 DIAGNOSIS — E119 Type 2 diabetes mellitus without complications: Secondary | ICD-10-CM

## 2018-03-01 DIAGNOSIS — H353221 Exudative age-related macular degeneration, left eye, with active choroidal neovascularization: Secondary | ICD-10-CM | POA: Diagnosis not present

## 2018-03-01 DIAGNOSIS — H353112 Nonexudative age-related macular degeneration, right eye, intermediate dry stage: Secondary | ICD-10-CM | POA: Diagnosis not present

## 2018-03-01 DIAGNOSIS — I10 Essential (primary) hypertension: Secondary | ICD-10-CM

## 2018-03-01 MED ORDER — BEVACIZUMAB CHEMO INJECTION 1.25MG/0.05ML SYRINGE FOR KALEIDOSCOPE
1.2500 mg | INTRAVITREAL | Status: AC
  Administered 2018-03-01: 1.25 mg via INTRAVITREAL

## 2018-03-05 ENCOUNTER — Encounter (INDEPENDENT_AMBULATORY_CARE_PROVIDER_SITE_OTHER): Payer: Self-pay | Admitting: Ophthalmology

## 2018-03-08 ENCOUNTER — Other Ambulatory Visit: Payer: Self-pay | Admitting: Dermatology

## 2018-03-08 DIAGNOSIS — L570 Actinic keratosis: Secondary | ICD-10-CM | POA: Diagnosis not present

## 2018-03-08 DIAGNOSIS — D485 Neoplasm of uncertain behavior of skin: Secondary | ICD-10-CM | POA: Diagnosis not present

## 2018-03-15 ENCOUNTER — Encounter: Payer: Self-pay | Admitting: Podiatry

## 2018-03-15 ENCOUNTER — Ambulatory Visit: Payer: Medicare Other | Admitting: Podiatry

## 2018-03-15 DIAGNOSIS — E1142 Type 2 diabetes mellitus with diabetic polyneuropathy: Secondary | ICD-10-CM

## 2018-03-15 DIAGNOSIS — I129 Hypertensive chronic kidney disease with stage 1 through stage 4 chronic kidney disease, or unspecified chronic kidney disease: Secondary | ICD-10-CM | POA: Diagnosis not present

## 2018-03-15 DIAGNOSIS — M79675 Pain in left toe(s): Secondary | ICD-10-CM | POA: Diagnosis not present

## 2018-03-15 DIAGNOSIS — B351 Tinea unguium: Secondary | ICD-10-CM

## 2018-03-15 DIAGNOSIS — E1121 Type 2 diabetes mellitus with diabetic nephropathy: Secondary | ICD-10-CM | POA: Diagnosis not present

## 2018-03-15 DIAGNOSIS — M79674 Pain in right toe(s): Secondary | ICD-10-CM

## 2018-03-15 DIAGNOSIS — E1165 Type 2 diabetes mellitus with hyperglycemia: Secondary | ICD-10-CM | POA: Diagnosis not present

## 2018-03-15 DIAGNOSIS — L84 Corns and callosities: Secondary | ICD-10-CM | POA: Diagnosis not present

## 2018-03-15 NOTE — Patient Instructions (Signed)

## 2018-03-15 NOTE — Progress Notes (Signed)
Subjective: Sue [name] presents for follow-up of ulceration plantar aspect right foot.  She presents with her daughter on today's visit.  Patient has not received her diabetic shoes.  Patient is still wearing her Darco shoe on the right foot and continues to wear the shoe around the house daily.  She did go for a 2 mile walk with her Skechers with no problem.    Her daughter checks her feet every day and states that the right foot continues to look good.  She is wearing a digital cap on the right second digit.   Sue Seashore, MD is her PCP.   Current Outpatient Medications:  .  amLODipine (NORVASC) 10 MG tablet, , Disp: , Rfl:  .  amLODipine (NORVASC) 5 MG tablet, Take 5 mg by mouth daily., Disp: , Rfl:  .  B-D ULTRAFINE III SHORT PEN 31G X 8 MM MISC, USE AS DIRECTED ONCE DAILY SUBCUTANEOUSLY, Disp: , Rfl: 11 .  Cobalamin Combinations (B-12) [phone] MCG SUBL, B-12, Disp: , Rfl:  .  empagliflozin (JARDIANCE) 25 MG TABS tablet, Take 25 mg by mouth daily., Disp: , Rfl:  .  enalapril (VASOTEC) 10 MG tablet, Take 20 mg by mouth 2 (two) times daily., Disp: , Rfl:  .  enalapril (VASOTEC) 20 MG tablet, , Disp: , Rfl:  .  esomeprazole (NEXIUM) 20 MG capsule, Take 20 mg by mouth daily., Disp: , Rfl:  .  glipiZIDE (GLUCOTROL) 10 MG tablet, Take 10 mg by mouth 2 (two) times daily before a meal., Disp: , Rfl:  .  glucose blood test strip, OneTouch Ultra Test strips  TEST ONCE A DAY ONCE A DAY FINGERSTICK 90, Disp: , Rfl:  .  hydrochlorothiazide (HYDRODIURIL) 25 MG tablet, , Disp: , Rfl:  .  HYDROcodone-acetaminophen (NORCO) 5-325 MG tablet, Take 1 tablet by mouth every 6 (six) hours as needed for moderate pain., Disp: 15 tablet, Rfl: 0 .  insulin glargine (LANTUS) 100 UNIT/ML injection, Inject into the skin at bedtime., Disp: , Rfl:  .  Insulin Pen Needle (PEN NEEDLES) 32G X 4 MM MISC, BD Ultra-Fine Nano Pen Needle 32 gauge x 5/32"  USE AS DIRECTED DAILY, Disp: , Rfl:  .  ketoconazole  (NIZORAL) 2 % cream, 1 APPLICATION TWICE A DAY EXTERNALLY 21 DAYS, Disp: , Rfl:  .  Lancets (ONETOUCH ULTRASOFT) lancets, OneTouch UltraSoft Lancets  USE TWICE DAILY, Disp: , Rfl:  .  liraglutide (VICTOZA) 18 MG/3ML SOPN, Victoza 3-Pak 0.6 mg/0.1 mL (18 mg/3 mL) subcutaneous pen injector  INJECT 1.8 MG DAILY, Disp: , Rfl:  .  liraglutide (VICTOZA) 18 MG/3ML SOPN, Victoza, Disp: , Rfl:  .  Multiple Vitamins-Minerals (CENTRUM SILVER 50+MEN PO), Centrum Silver, Disp: , Rfl:  .  Multiple Vitamins-Minerals (CENTRUM SILVER PO), Take by mouth., Disp: , Rfl:  .  nortriptyline (PAMELOR) 25 MG capsule, Take 25 mg by mouth at bedtime., Disp: , Rfl:  .  Omega-3 Fatty Acids (FISH OIL PO), Take by mouth., Disp: , Rfl:  .  Omega-3 Fatty Acids (FISH OIL) 1000 MG CAPS, Fish Oil, Disp: , Rfl:  .  Omeprazole (PRILOSEC PO), omeprazole, Disp: , Rfl:  .  omeprazole (PRILOSEC) 20 MG capsule, , Disp: , Rfl:  .  ONE TOUCH ULTRA TEST test strip, , Disp: , Rfl:  .  simvastatin (ZOCOR) 20 MG tablet, Take 20 mg by mouth daily., Disp: , Rfl:  .  sitaGLIPtin (JANUVIA) 100 MG tablet, Take 100 mg by mouth daily., Disp: , Rfl:  .  sulfamethoxazole-trimethoprim (BACTRIM DS,SEPTRA DS) 800-160 MG tablet, Take 1 tablet by mouth 2 (two) times daily., Disp: , Rfl:  .  TRESIBA FLEXTOUCH 200 UNIT/ML SOPN, 22 UNITS ONCE A DAY IN THE EVENING, Disp: , Rfl: 6  Current Facility-Administered Medications:  .  Bevacizumab (AVASTIN) SOLN 1.25 mg, 1.25 mg, Intravitreal, , Sue Caffey, MD, 1.25 mg at 03/01/18 1344  No Known Allergies  Vascular Examination: Capillary refill time <3 seconds x 10 digits.  Dorsalis pedis and Posterior tibial pulses present b/l.  No digital hair x 10 digits.  Skin temperature gradient within normal limits bilaterally.  Trace pedal edema noted bilateral lower extremities.  Dermatological Examination: Skin with normal turgor, texture and tone b/l.  Toenails 1-5 b/l discolored, thick, dystrophic with  subungual debris and pain with palpation to nailbeds due to thickness of nails.  Hyperkeratotic lesion submetatarsal head 2 right foot.  There is no erythema, no edema, no drainage, no flocculence noted.  Hyperkeratotic lesion noted distal tip of right second toe.  There is no erythema, no edema, no drainage, no flocculence noted.  Musculoskeletal: Muscle strength 5/5 to all LE muscle groups  Rigidly contracted hammertoe right second digit.  Neurological: Sensation diminished with 10 gram monofilament. Vibratory sensation diminished  Assessment: 1. Painful onychomycosis toenails 1-5 b/l 2. Corn distal tip right second digit 3. Callus submetatarsal head 2 right foot 4. NIDDM with Diabetic neuropathy  Plan: 1. Continue diabetic foot care principles.  2. Toenails 1-5 b/l were debrided in length and girth without iatrogenic bleeding. 3. Hyperkeratotic lesions pared with sterile scalpel blade without incident submetatarsal head 2 right foot and distal tip of right second digit. 4. Regarding her exercise regimen, I have informed her that she can walk twice a week.  She is to ride the bicycle her remaining days. 5. Patient to continue soft, supportive shoe gear.  We are still awaiting diabetic shoe certification paperwork from her PCP.  Speaking to our orthotics and prosthetics department, paperwork has been sent to the office several times.  We have given the family a copy of the paperwork and Sue [name] is to see Dr.Ramachandran on next week.  I do not want to put her back in a regular shoe as she has broken down on the right foot with an ulceration.  She is currently healed and we would like to put her in a diabetic shoes to prevent breakdown again. 6. Patient to report any pedal injuries to medical professional  7. Follow up 6 weeks. 8. Patient/POA to call should there be a concern in the interim.

## 2018-03-22 DIAGNOSIS — E1142 Type 2 diabetes mellitus with diabetic polyneuropathy: Secondary | ICD-10-CM | POA: Diagnosis not present

## 2018-03-22 DIAGNOSIS — E1121 Type 2 diabetes mellitus with diabetic nephropathy: Secondary | ICD-10-CM | POA: Diagnosis not present

## 2018-03-22 DIAGNOSIS — E1165 Type 2 diabetes mellitus with hyperglycemia: Secondary | ICD-10-CM | POA: Diagnosis not present

## 2018-03-22 DIAGNOSIS — E782 Mixed hyperlipidemia: Secondary | ICD-10-CM | POA: Diagnosis not present

## 2018-03-22 DIAGNOSIS — L84 Corns and callosities: Secondary | ICD-10-CM | POA: Diagnosis not present

## 2018-03-27 NOTE — Progress Notes (Signed)
[facility] Note  03/29/2018     CHIEF COMPLAINT Patient presents for Retina Follow Up   HISTORY OF PRESENT ILLNESS: Sue [name] is a 85 y.o. female who presents to the clinic today for:   HPI    Retina Follow Up    Patient presents with  Wet AMD.  In left eye.  This started 4 weeks ago.  Severity is moderate.  Duration of 4 weeks.  Since onset it is stable.  I, the attending physician,  performed the HPI with the patient and updated documentation appropriately.          Comments    Patient here for 4 weeks retina follow up for EXU ARMD OS. Patient states vision can't tell any difference. Saw no changes on the amsler grid. No eye pain. Patient fell a couple times this week.        Last edited by Bernarda Caffey, MD on 03/29/2018  3:29 PM. (History)    pt states she could not tell any difference in vision after receiving an injection at last visit   Referring physician: Merrilee Seashore, [address] [place], [place] [address]  HISTORICAL INFORMATION:   Selected notes from the MEDICAL RECORD NUMBER Referred by Dr. Quentin Ore for concern of ARMD OU LEE: 01.17.19 Read Drivers) [BCVA: OD: 20/30-1 OS: 20/40] Ocular Hx-floaters, ARMD OU, HTN ret OU, PVD OS, dry eye syndrome OU, Pseudophakia OU, pupillary miosis OU PMH-DM (taking Lantus, Januvia and Jardiance), HTN    CURRENT MEDICATIONS: Current Outpatient Medications (Ophthalmic Drugs)  Medication Sig  . Polyethyl Glycol-Propyl Glycol (SYSTANE) 0.4-0.3 % GEL ophthalmic gel Place 1 application into both eyes 3 (three) times daily.   No current facility-administered medications for this visit.  (Ophthalmic Drugs)   Current Outpatient Medications (Other)  Medication Sig  . amLODipine (NORVASC) 10 MG tablet Take 10 mg by mouth daily.   . B-D ULTRAFINE III SHORT PEN 31G X 8 MM MISC USE AS DIRECTED ONCE DAILY SUBCUTANEOUSLY  . calcium-vitamin D (OSCAL WITH D) 500-200 MG-UNIT  tablet Take 1 tablet by mouth daily at 12 noon.  . Cobalamin Combinations (B-12) [phone] MCG SUBL Take 1 tablet by mouth daily.   . enalapril (VASOTEC) 10 MG tablet Take 10 mg by mouth 2 (two) times daily.   Marland Kitchen esomeprazole (NEXIUM) 20 MG capsule Take 20 mg by mouth daily.  Marland Kitchen glipiZIDE (GLUCOTROL) 10 MG tablet Take 10 mg by mouth 2 (two) times daily before a meal.  . glucose blood test strip OneTouch Ultra Test strips  TEST ONCE A DAY ONCE A DAY FINGERSTICK 90  . hydrochlorothiazide (HYDRODIURIL) 25 MG tablet Take 25 mg by mouth every morning.   Marland Kitchen HYDROcodone-acetaminophen (NORCO) 5-325 MG tablet Take 1 tablet by mouth every 6 (six) hours as needed for moderate pain. (Patient not taking: Reported on 03/30/2018)  . Insulin Pen Needle (PEN NEEDLES) 32G X 4 MM MISC BD Ultra-Fine Nano Pen Needle 32 gauge x 5/32"  USE AS DIRECTED DAILY  . Lancets (ONETOUCH ULTRASOFT) lancets OneTouch UltraSoft Lancets  USE TWICE DAILY  . Multiple Vitamin (MULTIVITAMIN WITH MINERALS) TABS tablet Take 1 tablet by mouth daily.  . Multiple Vitamins-Minerals (EYE VITAMINS & MINERALS) TABS Take 1 tablet by mouth daily.  . nortriptyline (PAMELOR) 25 MG capsule Take 25 mg by mouth at bedtime.  . Omega-3 Fatty Acids (FISH OIL) 1000 MG CAPS Take 1,000 mg by mouth daily at 12 noon.   Marland Kitchen  omeprazole (PRILOSEC) 20 MG capsule   . ONE TOUCH ULTRA TEST test strip   . PRESCRIPTION MEDICATION Inject as directed once.  . simvastatin (ZOCOR) 20 MG tablet Take 20 mg by mouth daily.  . TRESIBA FLEXTOUCH 200 UNIT/ML SOPN 22 UNITS ONCE A DAY IN THE EVENING   Current Facility-Administered Medications (Other)  Medication Route  . Bevacizumab (AVASTIN) SOLN 1.25 mg Intravitreal  . Bevacizumab (AVASTIN) SOLN 1.25 mg Intravitreal      REVIEW OF SYSTEMS: ROS    Positive for: Endocrine, Eyes   Negative for: Constitutional, Gastrointestinal, Neurological, Skin, Genitourinary, Musculoskeletal, HENT, Cardiovascular, Respiratory, Psychiatric,  Allergic/Imm, Heme/Lymph   Last edited by Theodore Demark on 03/29/2018  2:42 PM. (History)       ALLERGIES No Known Allergies  PAST MEDICAL HISTORY Past Medical History:  Diagnosis Date  . Diabetes mellitus without complication ([place])   . Hypertension    Past Surgical History:  Procedure Laterality Date  . ABDOMINAL HYSTERECTOMY    . CATARACT EXTRACTION    . CHOLECYSTECTOMY    . EYE SURGERY    . SPINE SURGERY      FAMILY HISTORY History reviewed. No pertinent family history.  SOCIAL HISTORY Social History   Tobacco Use  . Smoking status: Former Research scientist (life sciences)  . Smokeless tobacco: Never Used  Substance Use Topics  . Alcohol use: No    Alcohol/week: 0.0 standard drinks  . Drug use: No         OPHTHALMIC EXAM:  Base Eye Exam    Visual Acuity (Snellen - Linear)      Right Left   Dist cc 20/30 -1 20/30 -2   Dist ph cc NI NI   Correction:  Glasses       Tonometry (Tonopen, 2:38 PM)      Right Left   Pressure 18 16       Pupils      Dark Light Shape React APD   Right 4 3 Round Brisk None   Left 4 3 Round Brisk None       Visual Fields (Counting fingers)      Left Right    Full Full       Extraocular Movement      Right Left    Full, Ortho Full, Ortho       Neuro/Psych    Oriented x3:  Yes   Mood/Affect:  Normal       Dilation    Both eyes:  1.0% Mydriacyl, 2.5% Phenylephrine @ 2:38 PM        Slit Lamp and Fundus Exam    Slit Lamp Exam      Right Left   Lids/Lashes Dermatochalasis - upper lid, Telangiectasia, mild MGD Dermatochalasis - upper lid, Telangiectasia, Meibomian gland dysfunction   Conjunctiva/Sclera White and quiet White and quiet   Cornea Temporal Well healed cataract wounds, mild Arcus, trace endo pigment nasally, 1+PEE Arcus, Temporal Well healed cataract wounds, 1-2+ PEE   Anterior Chamber Deep and quiet Deep and quiet   Iris Round and moderately dilated to 4.33m Round and moderately dilated to 555m Iris atrophy from 0100 to  0230, peripheral irodotimy at 0200   Lens 3-piece PC IOL in good position PC IOL in good position   Vitreous Vitreous syneresis, Posterior vitreous detachment, Weiss ring Vitreous syneresis, PVD       Fundus Exam      Right Left   Disc 360 Peripapillary atrophy, diffuse mild pallor mild pallor, Peripapillary atrophy and  pigmentation   C/D Ratio 0.3 0.3   Macula Blunted foveal reflex, +PED, RPE mottling and clumping, Drusen, No heme or edema Blunted foveal reflex, +PED, RPE mottling and clumping, Drusen, No heme or edema, +CNVM with overlying, shallow SRF -- improved   Vessels Vascular attenuation Vascular attenuation   Periphery Attached, 360 Reticular degeneration Attached, 360 Reticular degeneration        Refraction    Wearing Rx      Sphere Cylinder Axis   Right -1.50 +0.75 180   Left -0.50 +0.75 180          IMAGING AND PROCEDURES  Imaging and Procedures for _0 @  OCT, Retina - OU - Both Eyes       Right Eye Quality was good. Central Foveal Thickness: 240. Progression has been stable. Findings include normal foveal contour, no IRF, no SRF, pigment epithelial detachment, outer retinal atrophy, retinal drusen .   Left Eye Quality was good. Central Foveal Thickness: 274. Progression has improved. Findings include no IRF, retinal drusen , pigment epithelial detachment, outer retinal atrophy, epiretinal membrane, subretinal fluid, normal foveal contour (Interval improvement of SRF over-lying PED).   Notes *Images captured and stored on drive  Diagnosis / Impression:  OD: Non-Exudative ARMD OS: Exudative ARMD -- interval improvement in SRF  Clinical management:  See below  Abbreviations: NFP - Normal foveal profile. CME - cystoid macular edema. PED - pigment epithelial detachment. IRF - intraretinal fluid. SRF - subretinal fluid. EZ - ellipsoid zone. ERM - epiretinal membrane. ORA - outer retinal atrophy. ORT - outer retinal tubulation. SRHM - subretinal  hyper-reflective material         Intravitreal Injection, Pharmacologic Agent - OS - Left Eye       Time Out 03/29/2018. 3:15 PM. Confirmed correct patient, procedure, site, and patient consented.   Anesthesia Topical anesthesia was used. Anesthetic medications included Lidocaine 2%, Proparacaine 0.5%.   Procedure Preparation included 5% betadine to ocular surface, eyelid speculum. A 30 gauge needle was used.   Injection:  1.25 mg Bevacizumab (AVASTIN) SOLN   NDC: 08676-195-09, Lot: [id number] , Expiration date: 05/10/2018   Route: Intravitreal, Site: Left Eye, Waste: 0 mL  Post-op Post injection exam found visual acuity of at least counting fingers. The patient tolerated the procedure well. There were no complications. The patient received written and verbal post procedure care education.                 ASSESSMENT/PLAN:    ICD-10-CM   1. Exudative age-related macular degeneration of left eye with active choroidal neovascularization (HCC) H35.3221 Intravitreal Injection, Pharmacologic Agent - OS - Left Eye    Bevacizumab (AVASTIN) SOLN 1.25 mg  2. 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 Diabetes mellitus type 2 without retinopathy ([place]) E11.9   4. Hypertensive retinopathy of both eyes H35.033   5. Essential hypertension I10   6. Retinal edema H35.81 OCT, Retina - OU - Both Eyes  7. Pseudophakia of both eyes Z96.1   8. Intermediate stage nonexudative age-related macular degeneration of both eyes H35.3132    1. Exudative age-related macular degeneration, left eye  - interval conversion of OS from nonexudative to exudative ARMD  - s/p IVA OS #1 (02.05.20)  - OCT shows interval improvement of SRF overlying PED  - BCVA remains stable at 20/30 -2  - recommend IVA #2 OS today 03.04.2020  - risks and benefits of intravitreal avastin discussed with patient  - pt wishes  to be treated with IVA  - informed consent obtained  and signed  - see procedure note  - f/u in 4 wks for DFE, OCT, likely injection   2. Age related macular degeneration, non-exudative, right eye  - stable, former pt of Dr. Zigmund Daniel -- no significant change in OCT or exam  - The incidence, anatomy, and pathology of dry AMD, risk of progression, and the AREDS and AREDS 2 study including smoking risks discussed with patient.  - recommend amsler grid monitoring  3. Diabetes mellitus, type 2 without retinopathy  - The incidence, risk factors for progression, natural history and treatment options for diabetic retinopathy  were discussed with patient.    - The need for close monitoring of blood glucose, blood pressure, and serum lipids, avoiding cigarette or any type of tobacco, and the need for long term follow up was also discussed with patient.  - f/u 1 year   4,5. Hypertensive retinopathy OU - discussed importance of tight BP control - monitor  6. No retinal edema on exam or OCT  7. Pseudophakia OU  - s/p CE/IOL  - beautiful surgery, doing well  - monitor   Ophthalmic Meds Ordered this visit:  Meds ordered this encounter  Medications  . Bevacizumab (AVASTIN) SOLN 1.25 mg       Return in about 4 weeks (around 04/26/2018) for f/u exu ARMD OS, DFE, OCT.  There are no Patient Instructions on file for this visit.   Explained the diagnoses, plan, and follow up with the patient and they expressed understanding.  Patient expressed understanding of the importance of proper follow up care.   This document serves as a record of services personally performed by Gardiner Sleeper, MD, PhD. It was created on their behalf by Ernest Mallick, OA, an ophthalmic assistant. The creation of this record is the provider's dictation and/or activities during the visit.    Electronically signed by: Ernest Mallick, OA 03.02.2020 2:50 PM     Gardiner Sleeper, M.D., Ph.D. Diseases & Surgery of the Retina and Vitreous [facility]  I  have reviewed the above documentation for accuracy and completeness, and I agree with the above. Gardiner Sleeper, M.D., Ph.D. 03/31/18 2:51 PM    Abbreviations: M myopia (nearsighted); A astigmatism; H hyperopia (farsighted); P presbyopia; Mrx spectacle prescription;  CTL contact lenses; OD right eye; OS left eye; OU both eyes  XT exotropia; ET esotropia; PEK punctate epithelial keratitis; PEE punctate epithelial erosions; DES dry eye syndrome; MGD meibomian gland dysfunction; ATs artificial tears; PFAT's preservative free artificial tears; [place] nuclear sclerotic cataract; PSC posterior subcapsular cataract; ERM epi-retinal membrane; PVD posterior vitreous detachment; RD retinal detachment; DM diabetes mellitus; DR diabetic retinopathy; NPDR non-proliferative diabetic retinopathy; PDR proliferative diabetic retinopathy; CSME clinically significant macular edema; DME diabetic macular edema; dbh dot blot hemorrhages; CWS cotton wool spot; POAG primary open angle glaucoma; C/D cup-to-disc ratio; HVF humphrey visual field; GVF goldmann visual field; OCT optical coherence tomography; IOP intraocular pressure; BRVO Branch retinal vein occlusion; CRVO central retinal vein occlusion; CRAO central retinal artery occlusion; BRAO branch retinal artery occlusion; RT retinal tear; SB scleral buckle; PPV pars plana vitrectomy; VH Vitreous hemorrhage; PRP panretinal laser photocoagulation; IVK intravitreal kenalog; VMT vitreomacular traction; MH Macular hole;  NVD neovascularization of the disc; NVE neovascularization elsewhere; AREDS age related eye disease study; ARMD age related macular degeneration; POAG primary open angle glaucoma; EBMD epithelial/anterior basement membrane dystrophy; ACIOL anterior chamber intraocular lens; IOL intraocular lens;  PCIOL posterior chamber intraocular lens; Phaco/IOL phacoemulsification with intraocular lens placement; Bailey photorefractive keratectomy; LASIK laser assisted in situ  keratomileusis; HTN hypertension; DM diabetes mellitus; COPD chronic obstructive pulmonary disease

## 2018-03-29 ENCOUNTER — Encounter (INDEPENDENT_AMBULATORY_CARE_PROVIDER_SITE_OTHER): Payer: Self-pay | Admitting: Ophthalmology

## 2018-03-29 ENCOUNTER — Ambulatory Visit (INDEPENDENT_AMBULATORY_CARE_PROVIDER_SITE_OTHER): Payer: Medicare Other | Admitting: Ophthalmology

## 2018-03-29 DIAGNOSIS — H353132 Nonexudative age-related macular degeneration, bilateral, intermediate dry stage: Secondary | ICD-10-CM

## 2018-03-29 DIAGNOSIS — H353221 Exudative age-related macular degeneration, left eye, with active choroidal neovascularization: Secondary | ICD-10-CM | POA: Diagnosis not present

## 2018-03-29 DIAGNOSIS — E119 Type 2 diabetes mellitus without complications: Secondary | ICD-10-CM

## 2018-03-29 DIAGNOSIS — H353112 Nonexudative age-related macular degeneration, right eye, intermediate dry stage: Secondary | ICD-10-CM

## 2018-03-29 DIAGNOSIS — Z961 Presence of intraocular lens: Secondary | ICD-10-CM

## 2018-03-29 DIAGNOSIS — H35033 Hypertensive retinopathy, bilateral: Secondary | ICD-10-CM

## 2018-03-29 DIAGNOSIS — H3581 Retinal edema: Secondary | ICD-10-CM

## 2018-03-29 DIAGNOSIS — I10 Essential (primary) hypertension: Secondary | ICD-10-CM

## 2018-03-30 ENCOUNTER — Emergency Department (HOSPITAL_COMMUNITY): Payer: Medicare Other

## 2018-03-30 ENCOUNTER — Ambulatory Visit
Admission: EM | Admit: 2018-03-30 | Discharge: 2018-03-30 | Disposition: A | Discharge status: Home / Self Care | Payer: Medicare Other | Source: Home / Self Care | Attending: Family Medicine | Admitting: Family Medicine

## 2018-03-30 ENCOUNTER — Encounter: Payer: Self-pay | Admitting: Emergency Medicine

## 2018-03-30 ENCOUNTER — Encounter (HOSPITAL_COMMUNITY): Payer: Self-pay | Admitting: *Deleted

## 2018-03-30 ENCOUNTER — Other Ambulatory Visit: Payer: Self-pay

## 2018-03-30 ENCOUNTER — Emergency Department (HOSPITAL_COMMUNITY)
Admission: EM | Admit: 2018-03-30 | Discharge: 2018-03-30 | Disposition: A | Discharge status: Home / Self Care | Payer: Medicare Other | Attending: Emergency Medicine | Admitting: Emergency Medicine

## 2018-03-30 DIAGNOSIS — H9221 Otorrhagia, right ear: Secondary | ICD-10-CM

## 2018-03-30 DIAGNOSIS — I10 Essential (primary) hypertension: Secondary | ICD-10-CM | POA: Insufficient documentation

## 2018-03-30 DIAGNOSIS — Y939 Activity, unspecified: Secondary | ICD-10-CM | POA: Diagnosis not present

## 2018-03-30 DIAGNOSIS — Z794 Long term (current) use of insulin: Secondary | ICD-10-CM | POA: Insufficient documentation

## 2018-03-30 DIAGNOSIS — S00411A Abrasion of right ear, initial encounter: Secondary | ICD-10-CM

## 2018-03-30 DIAGNOSIS — Z79899 Other long term (current) drug therapy: Secondary | ICD-10-CM | POA: Diagnosis not present

## 2018-03-30 DIAGNOSIS — W269XXA Contact with unspecified sharp object(s), initial encounter: Secondary | ICD-10-CM | POA: Diagnosis not present

## 2018-03-30 DIAGNOSIS — E119 Type 2 diabetes mellitus without complications: Secondary | ICD-10-CM | POA: Insufficient documentation

## 2018-03-30 DIAGNOSIS — Y999 Unspecified external cause status: Secondary | ICD-10-CM | POA: Insufficient documentation

## 2018-03-30 DIAGNOSIS — S0990XA Unspecified injury of head, initial encounter: Secondary | ICD-10-CM

## 2018-03-30 DIAGNOSIS — Z87891 Personal history of nicotine dependence: Secondary | ICD-10-CM | POA: Insufficient documentation

## 2018-03-30 DIAGNOSIS — Y929 Unspecified place or not applicable: Secondary | ICD-10-CM | POA: Insufficient documentation

## 2018-03-30 NOTE — ED Triage Notes (Signed)
Pt sent by urgent care for bleeding from right ear after falling and hitting her head 2 days ago. Pt states she also noticed some clear drainage on her pillow this morning. Pt denies blurred vision, headache, or dizziness.

## 2018-03-30 NOTE — ED Notes (Signed)
Pt said the following. She fell from standing on Tuesday, hit back of head on floor, she didn't lose consciousness, and thought she was okay. Last night her right ear started bleeding (minimal) from inside of her ear. She might have scratched the inside of her ear while trying to access her hearing aid. The [facility] [address] said to come here.

## 2018-03-30 NOTE — Discharge Instructions (Signed)
Recommending further evaluation and management in the ED for head trauma following unwitnessed fall 2 days ago.  Patient and daughter aware and in agreement with plan.  Will go by private vehicle to ED.

## 2018-03-30 NOTE — Discharge Instructions (Addendum)
Please return for any problem. Follow up with your regular physician as instructed.  °

## 2018-03-30 NOTE — ED Notes (Addendum)
Pt discharged with son and daughter at this time, NAD noted and no complaints expressed on discharge.

## 2018-03-30 NOTE — ED Triage Notes (Signed)
Pt presents to [facility] for assessment of right ear pain since last night.  Has had two falls this past week as well, one with head injury (hit her head on the ground).  Patient states pain in her right ear started yesterday, and last night she woke up and noted her ear was bleeding.

## 2018-03-30 NOTE — ED Provider Notes (Signed)
[place] DEPT Provider Note   CSN: [id number] Arrival date & time: 03/30/18  1538    History   Chief Complaint Chief Complaint  Patient presents with  . Head Injury    HPI Sue [name] is a 85 y.o. female.     85 year old female with prior medical history as detailed below presents for evaluation and request CT imaging of her head.  Patient reportedly was at an urgent care earlier today for evaluation of right ear bleeding.  She reportedly had a hearing aid that was stuck in her right ear.  During her removal attempt she scratched the inside of her right ear and noticed some bleeding.  She also reports that she had a minor fall 2 days ago where her head struck the floor.  She denies associated loss of consciousness or neck pain.  This details provided to the urgent care provider who felt that the patient warranted CT imaging of the head today.    The history is provided by the patient and medical records.  Head Injury  Location:  Generalized Mechanism of injury: fall   Fall:    Height of fall:  From standing   Point of impact:  Head   Entrapped after fall: no   Pain details:    Quality: no pain    Severity:  No pain Chronicity:  New Relieved by:  Nothing Worsened by:  Nothing Ineffective treatments:  None tried   Past Medical History:  Diagnosis Date  . Diabetes mellitus without complication ([place])   . Hypertension     Patient Active Problem List   Diagnosis Date Noted  . Diabetic ulcer of foot associated with diabetes mellitus due to underlying condition, limited to breakdown of skin ([place]) 01/11/2018  . Right hip pain 04/15/2016  . Right buttock pain 04/15/2016  . Primary osteoarthritis of right hip 04/15/2016    Past Surgical History:  Procedure Laterality Date  . ABDOMINAL HYSTERECTOMY    . CATARACT EXTRACTION    . CHOLECYSTECTOMY    . EYE SURGERY    . SPINE SURGERY       OB History   No obstetric history on file.      Home Medications    Prior to Admission medications   Medication Sig Start Date End Date Taking? Authorizing Provider  amLODipine (NORVASC) 10 MG tablet Take 10 mg by mouth daily.  11/12/17  Yes [provider]  Cobalamin Combinations (B-12) [phone] MCG SUBL Take 1 tablet by mouth daily.    Yes [provider]  enalapril (VASOTEC) 10 MG tablet Take 10 mg by mouth 2 (two) times daily.    Yes [provider]  esomeprazole (NEXIUM) 20 MG capsule Take 20 mg by mouth daily.   Yes [provider]  glipiZIDE (GLUCOTROL) 10 MG tablet Take 10 mg by mouth 2 (two) times daily before a meal.   Yes [provider]  Multiple Vitamin (MULTIVITAMIN WITH MINERALS) TABS tablet Take 1 tablet by mouth daily.   Yes [provider]  Omega-3 Fatty Acids (FISH OIL) 1000 MG CAPS Take 1,000 mg by mouth daily at 12 noon.    Yes [provider]  B-D ULTRAFINE III SHORT PEN 31G X 8 MM MISC USE AS DIRECTED ONCE DAILY SUBCUTANEOUSLY 09/14/17   [provider]  glucose blood test strip OneTouch Ultra Test strips  TEST ONCE A DAY ONCE A DAY FINGERSTICK 90    [provider]  hydrochlorothiazide (HYDRODIURIL) 25  MG tablet  11/12/17   [provider]  HYDROcodone-acetaminophen (NORCO) 5-325 MG tablet Take 1 tablet by mouth every 6 (six) hours as needed for moderate pain. Patient not taking: Reported on 03/30/2018 04/15/16   Horald Pollen, MD  Insulin Pen Needle (PEN NEEDLES) 32G X 4 MM MISC BD Ultra-Fine Nano Pen Needle 32 gauge x 5/32"  USE AS DIRECTED DAILY    [provider]  ketoconazole (NIZORAL) 2 % cream 1 APPLICATION TWICE A DAY EXTERNALLY 21 DAYS 02/15/18   [provider]  Lancets (ONETOUCH ULTRASOFT) lancets OneTouch UltraSoft Lancets  USE TWICE DAILY    [provider]  liraglutide (VICTOZA) 18 MG/3ML SOPN Victoza 3-Pak 0.6 mg/0.1 mL (18 mg/3 mL) subcutaneous pen injector  INJECT 1.8 MG  DAILY    [provider]  liraglutide (VICTOZA) 18 MG/3ML SOPN Victoza    [provider]  nortriptyline (PAMELOR) 25 MG capsule Take 25 mg by mouth at bedtime.    [provider]  Omeprazole (PRILOSEC PO) omeprazole    [provider]  omeprazole (PRILOSEC) 20 MG capsule  12/08/17   [provider]  ONE TOUCH ULTRA TEST test strip  12/07/17   [provider]  simvastatin (ZOCOR) 20 MG tablet Take 20 mg by mouth daily.    [provider]  sitaGLIPtin (JANUVIA) 100 MG tablet Take 100 mg by mouth daily.    [provider]  sulfamethoxazole-trimethoprim (BACTRIM DS,SEPTRA DS) 800-160 MG tablet Take 1 tablet by mouth 2 (two) times daily. 12/29/17   [provider]  TRESIBA FLEXTOUCH 200 UNIT/ML SOPN 22 UNITS ONCE A DAY IN THE EVENING 12/01/17   [provider]    Family History No family history on file.  Social History Social History   Tobacco Use  . Smoking status: Former Research scientist (life sciences)  . Smokeless tobacco: Never Used  Substance Use Topics  . Alcohol use: No    Alcohol/week: 0.0 standard drinks  . Drug use: No     Allergies   Patient has no known allergies.   Review of Systems Review of Systems  All other systems reviewed and are negative.    Physical Exam Updated Vital Signs BP (!) 189/86 (BP Location: Left Arm)   Pulse 89   Temp 97.9 F (36.6 C) (Oral)   Resp 18   SpO2 100%   Physical Exam Vitals signs and nursing note reviewed.  Constitutional:      General: She is not in acute distress.    Appearance: She is well-developed.  HENT:     Head: Normocephalic and atraumatic.     Comments: Patient skull is atraumatic.  No evidence of basilar skull fracture on exam.    Right Ear: Tympanic membrane and external ear normal.     Left Ear: Tympanic membrane, ear canal and external ear normal.     Ears:     Comments: Small quantity of clotted blood in the right ear canal.  No foreign body  noted.  TM is intact.    Nose: Nose normal.     Mouth/Throat:     Mouth: Mucous membranes are moist.  Eyes:     Conjunctiva/sclera: Conjunctivae normal.     Pupils: Pupils are equal, round, and reactive to light.  Neck:     Musculoskeletal: Normal range of motion and neck supple.  Cardiovascular:     Rate and Rhythm: Normal rate and regular rhythm.     Heart sounds: Normal heart sounds.  Pulmonary:  Effort: Pulmonary effort is normal. No respiratory distress.     Breath sounds: Normal breath sounds.  Abdominal:     General: There is no distension.     Palpations: Abdomen is soft.     Tenderness: There is no abdominal tenderness.  Musculoskeletal: Normal range of motion.        General: No deformity.  Skin:    General: Skin is warm and dry.  Neurological:     General: No focal deficit present.     Mental Status: She is alert and oriented to person, place, and time. Mental status is at baseline.     Cranial Nerves: No cranial nerve deficit.     Comments: GCS 15      ED Treatments / Results  Labs (all labs ordered are listed, but only abnormal results are displayed) Labs Reviewed - No data to display  EKG None  Radiology Ct Head Wo Contrast  Result Date: 03/30/2018 CLINICAL DATA:  Bleeding from the right ear after fall EXAM: CT HEAD WITHOUT CONTRAST TECHNIQUE: Contiguous axial images were obtained from the base of the skull through the vertex without intravenous contrast. COMPARISON:  None. FINDINGS: Brain: No acute territorial infarction, hemorrhage or intracranial mass. Moderate-to-marked atrophy. Mild small vessel ischemic changes of the white matter. Nonenlarged ventricles Vascular: No hyperdense vessels.  Carotid vascular calcification. Skull: No depressed skull fracture. Trace fluid in the right mastoid air cells. Sinuses/Orbits: No acute finding. Other: None IMPRESSION: 1. No CT evidence for acute intracranial abnormality. 2. Atrophy and mild small vessel ischemic  changes of the white matter 3. Trace fluid in the right mastoid air cells. No definitive fracture lucency is seen. Electronically Signed   By: Donavan Foil M.D.   On: 03/30/2018 17:17    Procedures Procedures (including critical care time)  Medications Ordered in ED Medications - No data to display   Initial Impression / Assessment and Plan / ED Course  I have reviewed the triage vital signs and the nursing notes.  Pertinent labs & imaging results that were available during my care of the patient were reviewed by me and considered in my medical decision making (see chart for details).        MDM  Screen  Complete  Patient is presenting for evaluation following reported minor fall with suspected head injury.  Patient's right ear canal does show some minor abrasion.  This is most likely secondary to the patient's fingernail when she was trying to move a hearing aid.  She does not have evidence on exam of significant traumatic injury to her scalp or head.  CT imaging does not reveal significant intracranial injury.   Patient appears to be appropriate for discharge.    Strict return precautions given and understood.  Importance of close follow-up is stressed.  Final Clinical Impressions(s) / ED Diagnoses   Final diagnoses:  Abrasion of right ear canal, initial encounter    ED Discharge Orders    None       Valarie Merino, MD 03/30/18 1735

## 2018-03-30 NOTE — ED Provider Notes (Signed)
[place]   [id number] 03/30/18 Arrival Time: 48  CC: Rt ear pain and head trauma  SUBJECTIVE: HPI obtained from daughter and patient  Sue [name] is a 85 y.o. female hx significant for DM, and HTN, who complains of right ear pain x 1 day.  Symptoms began after scratching the inside of her right ear.  Daughter also mentions two falls earlier this week.  Most recent was 2 days.  Daughter states she slipped and fell hitting the back of her head on the ground.  Fall unwitnessed.  Pt unsure if she had LOC or blacked out at that time.  Daughter came over later to check on patient and she was sitting in bed not in distress.  Denies head pain, or headache. Patient denies vision changes, dizziness, lightheadedness, fever, chills, nausea, vomiting, chest pain, SOB, abdominal pain, weakness in extremities, numbness or tingling.    ROS: As per HPI.  Past Medical History:  Diagnosis Date  . Diabetes mellitus without complication ([place])   . Hypertension    Past Surgical History:  Procedure Laterality Date  . ABDOMINAL HYSTERECTOMY    . CATARACT EXTRACTION    . CHOLECYSTECTOMY    . EYE SURGERY    . SPINE SURGERY     No Known Allergies Current Facility-Administered Medications on File Prior to Encounter  Medication Dose Route Frequency Provider Last Rate Last Dose  . Bevacizumab (AVASTIN) SOLN 1.25 mg  1.25 mg Intravitreal  Bernarda Caffey, MD   1.25 mg at 03/01/18 1344   Current Outpatient Medications on File Prior to Encounter  Medication Sig Dispense Refill  . amLODipine (NORVASC) 10 MG tablet     . amLODipine (NORVASC) 5 MG tablet Take 5 mg by mouth daily.    . B-D ULTRAFINE III SHORT PEN 31G X 8 MM MISC USE AS DIRECTED ONCE DAILY SUBCUTANEOUSLY  11  . Cobalamin Combinations (B-12) [phone] MCG SUBL B-12    . empagliflozin (JARDIANCE) 25 MG TABS tablet Take 25 mg by mouth daily.    . enalapril (VASOTEC) 10 MG tablet Take 20 mg by mouth 2 (two) times daily.    . enalapril  (VASOTEC) 20 MG tablet     . esomeprazole (NEXIUM) 20 MG capsule Take 20 mg by mouth daily.    Marland Kitchen glipiZIDE (GLUCOTROL) 10 MG tablet Take 10 mg by mouth 2 (two) times daily before a meal.    . glucose blood test strip OneTouch Ultra Test strips  TEST ONCE A DAY ONCE A DAY FINGERSTICK 90    . hydrochlorothiazide (HYDRODIURIL) 25 MG tablet     . HYDROcodone-acetaminophen (NORCO) 5-325 MG tablet Take 1 tablet by mouth every 6 (six) hours as needed for moderate pain. 15 tablet 0  . insulin glargine (LANTUS) 100 UNIT/ML injection Inject into the skin at bedtime.    . Insulin Pen Needle (PEN NEEDLES) 32G X 4 MM MISC BD Ultra-Fine Nano Pen Needle 32 gauge x 5/32"  USE AS DIRECTED DAILY    . ketoconazole (NIZORAL) 2 % cream 1 APPLICATION TWICE A DAY EXTERNALLY 21 DAYS    . Lancets (ONETOUCH ULTRASOFT) lancets OneTouch UltraSoft Lancets  USE TWICE DAILY    . liraglutide (VICTOZA) 18 MG/3ML SOPN Victoza 3-Pak 0.6 mg/0.1 mL (18 mg/3 mL) subcutaneous pen injector  INJECT 1.8 MG DAILY    . liraglutide (VICTOZA) 18 MG/3ML SOPN Victoza    . Multiple Vitamins-Minerals (CENTRUM SILVER 50+MEN PO) Centrum Silver    . Multiple Vitamins-Minerals (CENTRUM SILVER PO)  Take by mouth.    . nortriptyline (PAMELOR) 25 MG capsule Take 25 mg by mouth at bedtime.    . Omega-3 Fatty Acids (FISH OIL PO) Take by mouth.    . Omega-3 Fatty Acids (FISH OIL) 1000 MG CAPS Fish Oil    . Omeprazole (PRILOSEC PO) omeprazole    . omeprazole (PRILOSEC) 20 MG capsule     . ONE TOUCH ULTRA TEST test strip     . simvastatin (ZOCOR) 20 MG tablet Take 20 mg by mouth daily.    . sitaGLIPtin (JANUVIA) 100 MG tablet Take 100 mg by mouth daily.    Marland Kitchen sulfamethoxazole-trimethoprim (BACTRIM DS,SEPTRA DS) 800-160 MG tablet Take 1 tablet by mouth 2 (two) times daily.    . TRESIBA FLEXTOUCH 200 UNIT/ML SOPN 22 UNITS ONCE A DAY IN THE EVENING  6   Social History   Socioeconomic History  . Marital status: Married    Spouse name: Not on file  .  Number of children: Not on file  . Years of education: Not on file  . Highest education level: Not on file  Occupational History  . Not on file  Social Needs  . Financial resource strain: Not on file  . Food insecurity:    Worry: Not on file    Inability: Not on file  . Transportation needs:    Medical: Not on file    Non-medical: Not on file  Tobacco Use  . Smoking status: Former Research scientist (life sciences)  . Smokeless tobacco: Never Used  Substance and Sexual Activity  . Alcohol use: No    Alcohol/week: 0.0 standard drinks  . Drug use: No  . Sexual activity: Not on file  Lifestyle  . Physical activity:    Days per week: Not on file    Minutes per session: Not on file  . Stress: Not on file  Relationships  . Social connections:    Talks on phone: Not on file    Gets together: Not on file    Attends religious service: Not on file    Active member of club or organization: Not on file    Attends meetings of clubs or organizations: Not on file    Relationship status: Not on file  . Intimate partner violence:    Fear of current or ex partner: Not on file    Emotionally abused: Not on file    Physically abused: Not on file    Forced sexual activity: Not on file  Other Topics Concern  . Not on file  Social History Narrative  . Not on file   History reviewed. No pertinent family history.  OBJECTIVE:  Vitals:   03/30/18 1435  BP: 138/81  Pulse: 85  Resp: 18  Temp: (!) 97.4 F (36.3 C)  TempSrc: Oral  SpO2: 94%    Patient is not wearing hearing aids.  Exam may be limited due to decreased hearing.    General appearance: AOx3 to person, place, and day; no acute distress Eyes: PERRLA; EOM not intact HENT: normocephalic; atraumatic; LT EAC clear, TM pearly gray; RT EAC with dried blood, no obvious blood appreciated behind the TM; Nose: nares patent without rhinorrhea; Throat: oropharynx clear, tonsils not enlarged or erythematous,  Neck: supple with FROM Lungs: clear to auscultation  bilaterally Heart: regular rate and rhythm.  Radial pulses 2+ symmetrical bilaterally Extremities: no edema; symmetrical with no gross deformities Skin: warm and dry Neurologic: no facial asymmetries, sensation intact over face; negative pronator drift; finger to nose  with minimal difficulty; normal gait; strength and sensation intact bilaterally about the upper and lower extremities Psychological: alert and cooperative; normal mood and affect  ASSESSMENT & PLAN:  1. Traumatic injury of head, initial encounter   2. Blood in ear canal, right     Recommending further evaluation and management in the ED for head trauma following unwitnessed fall 2 days ago.  Patient and daughter aware and in agreement with plan.  Will go by private vehicle to ED.      Lestine Box, PA-C 03/30/18 1539

## 2018-03-30 NOTE — ED Notes (Signed)
Patient able to ambulate independently with cane at baseline, directed to follow up at ER at discharge

## 2018-03-31 ENCOUNTER — Encounter (INDEPENDENT_AMBULATORY_CARE_PROVIDER_SITE_OTHER): Payer: Self-pay | Admitting: Ophthalmology

## 2018-03-31 MED ORDER — BEVACIZUMAB CHEMO INJECTION 1.25MG/0.05ML SYRINGE FOR KALEIDOSCOPE
1.2500 mg | INTRAVITREAL | Status: AC
  Administered 2018-03-31: 1.25 mg via INTRAVITREAL

## 2018-04-12 DIAGNOSIS — R296 Repeated falls: Secondary | ICD-10-CM | POA: Diagnosis not present

## 2018-04-12 DIAGNOSIS — E1165 Type 2 diabetes mellitus with hyperglycemia: Secondary | ICD-10-CM | POA: Diagnosis not present

## 2018-04-14 ENCOUNTER — Ambulatory Visit: Payer: Medicare Other | Admitting: Orthotics

## 2018-04-14 ENCOUNTER — Other Ambulatory Visit: Payer: Self-pay

## 2018-04-14 DIAGNOSIS — L84 Corns and callosities: Secondary | ICD-10-CM

## 2018-04-14 DIAGNOSIS — M2041 Other hammer toe(s) (acquired), right foot: Secondary | ICD-10-CM | POA: Diagnosis not present

## 2018-04-14 DIAGNOSIS — L03031 Cellulitis of right toe: Secondary | ICD-10-CM | POA: Diagnosis not present

## 2018-04-14 DIAGNOSIS — B351 Tinea unguium: Secondary | ICD-10-CM

## 2018-04-14 DIAGNOSIS — L97519 Non-pressure chronic ulcer of other part of right foot with unspecified severity: Secondary | ICD-10-CM

## 2018-04-14 DIAGNOSIS — E08621 Diabetes mellitus due to underlying condition with foot ulcer: Secondary | ICD-10-CM

## 2018-04-14 DIAGNOSIS — E1142 Type 2 diabetes mellitus with diabetic polyneuropathy: Secondary | ICD-10-CM

## 2018-04-14 DIAGNOSIS — M79675 Pain in left toe(s): Principal | ICD-10-CM

## 2018-04-14 DIAGNOSIS — M79674 Pain in right toe(s): Principal | ICD-10-CM

## 2018-04-14 NOTE — Progress Notes (Signed)

## 2018-04-17 ENCOUNTER — Ambulatory Visit: Payer: Medicare Other | Admitting: Orthotics

## 2018-04-26 ENCOUNTER — Ambulatory Visit: Payer: Medicare Other | Admitting: Podiatry

## 2018-05-03 ENCOUNTER — Encounter (INDEPENDENT_AMBULATORY_CARE_PROVIDER_SITE_OTHER): Payer: Medicare Other | Admitting: Ophthalmology

## 2018-05-24 ENCOUNTER — Encounter (INDEPENDENT_AMBULATORY_CARE_PROVIDER_SITE_OTHER): Payer: Medicare Other | Admitting: Ophthalmology

## 2018-05-24 NOTE — Progress Notes (Signed)
[facility] Note  05/25/2018     CHIEF COMPLAINT Patient presents for Retina Follow Up   HISTORY OF PRESENT ILLNESS: Sue [name] is a 85 y.o. female who presents to the clinic today for:   HPI    Retina Follow Up    Patient presents with  Wet AMD.  In left eye.  This started months ago.  Severity is moderate.  Duration of 8 weeks.  Since onset it is stable.  I, the attending physician,  performed the HPI with the patient and updated documentation appropriately.          Comments    85 y/o female pt here for 8 wk f/u for exu ARMD w/active CNV OS.  VA OS seems a bit blurrier, but reports no wavy lines or missing spots on Amsler grid.  No change in [place] OD.  Denies pain, flashes, floaters.  Systane prn OU.  BS 121 this a.m.  A1C unknown.       Last edited by Bernarda Caffey, MD on 05/25/2018 10:56 AM. (History)    pt states her left eye vision has gotten "fuzzy" over the past week, pt is delayed to follow up due to COVID-19   Referring physician: Merrilee Seashore, [address], [place] [address]  HISTORICAL INFORMATION:   Selected notes from the [facility] Referred by Dr. Quentin Ore for concern of ARMD OU LEE: 01.17.19 [employer]) [BCVA: OD: 20/30-1 OS: 20/40] Ocular Hx-floaters, ARMD OU, HTN ret OU, PVD OS, dry eye syndrome OU, Pseudophakia OU, pupillary miosis OU PMH-DM (taking Lantus, Januvia and Jardiance), HTN    CURRENT MEDICATIONS: Current Outpatient Medications (Ophthalmic Drugs)  Medication Sig  . Polyethyl Glycol-Propyl Glycol (SYSTANE) 0.4-0.3 % GEL ophthalmic gel Place 1 application into both eyes 3 (three) times daily.   No current facility-administered medications for this visit.  (Ophthalmic Drugs)   Current Outpatient Medications (Other)  Medication Sig  . amLODipine (NORVASC) 10 MG tablet Take 10 mg by mouth daily.   . B-D ULTRAFINE III SHORT PEN 31G X 8 MM MISC USE AS DIRECTED ONCE  DAILY SUBCUTANEOUSLY  . calcium-vitamin D (OSCAL WITH D) 500-200 MG-UNIT tablet Take 1 tablet by mouth daily at 12 noon.  . Cobalamin Combinations (B-12) [phone] MCG SUBL Take 1 tablet by mouth daily.   . enalapril (VASOTEC) 20 MG tablet   . esomeprazole (NEXIUM) 20 MG capsule Take 20 mg by mouth daily.  Marland Kitchen glipiZIDE (GLUCOTROL) 10 MG tablet Take 10 mg by mouth 2 (two) times daily before a meal.  . glucose blood test strip OneTouch Ultra Test strips  TEST ONCE A DAY ONCE A DAY FINGERSTICK 90  . hydrochlorothiazide (HYDRODIURIL) 25 MG tablet Take 25 mg by mouth every morning.   Marland Kitchen HYDROcodone-acetaminophen (NORCO) 5-325 MG tablet Take 1 tablet by mouth every 6 (six) hours as needed for moderate pain.  . Insulin Pen Needle (PEN NEEDLES) 32G X 4 MM MISC BD Ultra-Fine Nano Pen Needle 32 gauge x 5/32"  USE AS DIRECTED DAILY  . Lancets (ONETOUCH ULTRASOFT) lancets OneTouch UltraSoft Lancets  USE TWICE DAILY  . Multiple Vitamin (MULTIVITAMIN WITH MINERALS) TABS tablet Take 1 tablet by mouth daily.  . Multiple Vitamins-Minerals (EYE VITAMINS & MINERALS) TABS Take 1 tablet by mouth daily.  . nortriptyline (PAMELOR) 25 MG capsule Take 25 mg by mouth at bedtime.  . Omega-3 Fatty Acids (FISH OIL) 1000 MG CAPS Take 1,000 mg by mouth  daily at 12 noon.   Marland Kitchen omeprazole (PRILOSEC) 20 MG capsule   . ONE TOUCH ULTRA TEST test strip   . PRESCRIPTION MEDICATION Inject as directed once.  . simvastatin (ZOCOR) 20 MG tablet Take 20 mg by mouth daily.  . TRESIBA FLEXTOUCH 200 UNIT/ML SOPN 22 UNITS ONCE A DAY IN THE EVENING  . enalapril (VASOTEC) 10 MG tablet Take 10 mg by mouth 2 (two) times daily.    Current Facility-Administered Medications (Other)  Medication Route  . Bevacizumab (AVASTIN) SOLN 1.25 mg Intravitreal  . Bevacizumab (AVASTIN) SOLN 1.25 mg Intravitreal      REVIEW OF SYSTEMS: ROS    Positive for: Endocrine, Eyes   Negative for: Constitutional, Gastrointestinal, Neurological, Skin,  Genitourinary, Musculoskeletal, HENT, Cardiovascular, Respiratory, Psychiatric, Allergic/Imm, Heme/Lymph   Last edited by Matthew Folks, COA on 05/25/2018 10:14 AM. (History)       ALLERGIES No Known Allergies  PAST MEDICAL HISTORY Past Medical History:  Diagnosis Date  . Diabetes mellitus without complication ([place])   . Hypertension   . Hypertensive retinopathy    OU  . Macular degeneration    OU   Past Surgical History:  Procedure Laterality Date  . ABDOMINAL HYSTERECTOMY    . CATARACT EXTRACTION    . CHOLECYSTECTOMY    . EYE SURGERY    . SPINE SURGERY      FAMILY HISTORY History reviewed. No pertinent family history.  SOCIAL HISTORY Social History   Tobacco Use  . Smoking status: Former Research scientist (life sciences)  . Smokeless tobacco: Never Used  Substance Use Topics  . Alcohol use: No    Alcohol/week: 0.0 standard drinks  . Drug use: No         OPHTHALMIC EXAM:  Base Eye Exam    Visual Acuity (Snellen - Linear)      Right Left   Dist cc 20/25 -2 20/30 -2   Dist ph cc NI NI   Correction:  Glasses       Tonometry (Tonopen, 10:18 AM)      Right Left   Pressure 17 17       Pupils      Dark Light Shape React APD   Right 4 3 Round Brisk None   Left 4 3 Round Brisk None       Visual Fields (Counting fingers)      Left Right    Full Full       Extraocular Movement      Right Left    Full, Ortho Full, Ortho       Neuro/Psych    Oriented x3:  Yes   Mood/Affect:  Normal       Dilation    Both eyes:  1.0% Mydriacyl, 2.5% Phenylephrine @ 10:18 AM        Slit Lamp and Fundus Exam    Slit Lamp Exam      Right Left   Lids/Lashes Dermatochalasis - upper lid, Telangiectasia, mild MGD Dermatochalasis - upper lid, Telangiectasia, Meibomian gland dysfunction   Conjunctiva/Sclera White and quiet White and quiet   Cornea Temporal Well healed cataract wounds, mild Arcus, trace endo pigment nasally, 1+PEE Arcus, Temporal Well healed cataract wounds, 1-2+ PEE    Anterior Chamber Deep and quiet Deep and quiet   Iris Round and moderately dilated to 4.110m Round and moderately dilated to 545m Iris atrophy from 0100 to 0230, peripheral irodotimy at 0200   Lens 3-piece PC IOL in good position PC IOL in good position  Vitreous Vitreous syneresis, Posterior vitreous detachment, Weiss ring Vitreous syneresis, PVD       Fundus Exam      Right Left   Disc 360 Peripapillary atrophy, diffuse mild pallor mild pallor, Peripapillary atrophy and pigmentation   C/D Ratio 0.3 0.3   Macula Blunted foveal reflex, +PED, RPE mottling and clumping, Drusen, No heme or edema Blunted foveal reflex, +PED, RPE mottling and clumping, Drusen, No heme or edema, +CNVM with overlying, shallow SRF -- mildly increased   Vessels Vascular attenuation Vascular attenuation   Periphery Attached, 360 Reticular degeneration Attached, 360 Reticular degeneration          IMAGING AND PROCEDURES  Imaging and Procedures for '@TODAY' @  OCT, Retina - OU - Both Eyes       Right Eye Quality was good. Central Foveal Thickness: 233. Progression has been stable. Findings include normal foveal contour, no IRF, no SRF, pigment epithelial detachment, outer retinal atrophy, retinal drusen .   Left Eye Quality was good. Central Foveal Thickness: 256. Progression has worsened. Findings include no IRF, retinal drusen , pigment epithelial detachment, outer retinal atrophy, epiretinal membrane, subretinal fluid, abnormal foveal contour (Mild interval increase in SRF over-lying PED).   Notes *Images captured and stored on drive  Diagnosis / Impression:  OD: Non-Exudative ARMD OS: Exudative ARMD -- Mild interval increase in SRF over-lying PED  Clinical management:  See below  Abbreviations: NFP - Normal foveal profile. CME - cystoid macular edema. PED - pigment epithelial detachment. IRF - intraretinal fluid. SRF - subretinal fluid. EZ - ellipsoid zone. ERM - epiretinal membrane. ORA - outer retinal  atrophy. ORT - outer retinal tubulation. SRHM - subretinal hyper-reflective material         Intravitreal Injection, Pharmacologic Agent - OS - Left Eye       Time Out 05/25/2018. 10:35 AM. Confirmed correct patient, procedure, site, and patient consented.   Anesthesia Topical anesthesia was used. Anesthetic medications included Lidocaine 2%, Proparacaine 0.5%.   Procedure Preparation included 5% betadine to ocular surface, eyelid speculum. A supplied needle was used.   Injection:  1.25 mg Bevacizumab (AVASTIN) SOLN   NDC: 62035-597-41, Lot: [id number] , Expiration date: 06/28/2018   Route: Intravitreal, Site: Left Eye, Waste: 0 mL  Post-op Post injection exam found visual acuity of at least counting fingers. The patient tolerated the procedure well. There were no complications. The patient received written and verbal post procedure care education.                 ASSESSMENT/PLAN:    ICD-10-CM   1. Exudative age-related macular degeneration of left eye with active choroidal neovascularization (HCC) H35.3221 Intravitreal Injection, Pharmacologic Agent - OS - Left Eye    Bevacizumab (AVASTIN) SOLN 1.25 mg  2. 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 Diabetes mellitus type 2 without retinopathy ([place]) E11.9   4. Hypertensive retinopathy of both eyes H35.033   5. Essential hypertension I10   6. Retinal edema H35.81 OCT, Retina - OU - Both Eyes  7. Pseudophakia of both eyes Z96.1    1. Exudative age-related macular degeneration, left eye  - conversion of OS from nonexudative to exudative ARMD prior to 02.05.20 visit  - s/p IVA OS #1 (02.05.20), #2 (03.04.20)  - delayed f/u due to COVID-19 restrictions -- 8 wks instead of 4  - OCT shows mild interval increase SRF overlying PED  - BCVA remains stable at 20/30 -2  - recommend IVA #3 OS  today 04.30.2020  - risks and benefits of intravitreal avastin discussed with patient  -  pt wishes to be treated with IVA  - informed consent obtained and signed  - see procedure note  - f/u in 5-6 wks for DFE, OCT, likely injection OS   2. Age related macular degeneration, non-exudative, right eye  - stable, former pt of Dr. Zigmund Daniel -- no significant change in OCT or exam  - The incidence, anatomy, and pathology of dry AMD, risk of progression, and the AREDS and AREDS 2 study including smoking risks discussed with patient.  - recommend amsler grid monitoring  3. Diabetes mellitus, type 2 without retinopathy  - The incidence, risk factors for progression, natural history and treatment options for diabetic retinopathy  were discussed with patient.    - The need for close monitoring of blood glucose, blood pressure, and serum lipids, avoiding cigarette or any type of tobacco, and the need for long term follow up was also discussed with patient.  - f/u 1 year   4,5. Hypertensive retinopathy OU  - discussed importance of tight BP control  - monitor  6. No retinal edema on exam or OCT  7. Pseudophakia OU  - s/p CE/IOL OU  - beautiful surgeries, doing well  - monitor   Ophthalmic Meds Ordered this visit:  Meds ordered this encounter  Medications  . Bevacizumab (AVASTIN) SOLN 1.25 mg       Return for f/u 5-6 weeks, exu ARMD OS, OCT, DFE.  There are no Patient Instructions on file for this visit.   Explained the diagnoses, plan, and follow up with the patient and they expressed understanding.  Patient expressed understanding of the importance of proper follow up care.   This document serves as a record of services personally performed by Gardiner Sleeper, MD, PhD. It was created on their behalf by Ernest Mallick, OA, an ophthalmic assistant. The creation of this record is the provider's dictation and/or activities during the visit.    Electronically signed by: Ernest Mallick, OA  04.29.2020 12:44 AM    Gardiner Sleeper, M.D., Ph.D. Diseases & Surgery of the Retina and  Vitreous [facility]  I have reviewed the above documentation for accuracy and completeness, and I agree with the above. Gardiner Sleeper, M.D., Ph.D. 05/26/18 12:44 AM    Abbreviations: M myopia (nearsighted); A astigmatism; H hyperopia (farsighted); P presbyopia; Mrx spectacle prescription;  CTL contact lenses; OD right eye; OS left eye; OU both eyes  XT exotropia; ET esotropia; PEK punctate epithelial keratitis; PEE punctate epithelial erosions; DES dry eye syndrome; MGD meibomian gland dysfunction; ATs artificial tears; PFAT's preservative free artificial tears; [place] nuclear sclerotic cataract; PSC posterior subcapsular cataract; ERM epi-retinal membrane; PVD posterior vitreous detachment; RD retinal detachment; DM diabetes mellitus; DR diabetic retinopathy; NPDR non-proliferative diabetic retinopathy; PDR proliferative diabetic retinopathy; CSME clinically significant macular edema; DME diabetic macular edema; dbh dot blot hemorrhages; CWS cotton wool spot; POAG primary open angle glaucoma; C/D cup-to-disc ratio; HVF humphrey visual field; GVF goldmann visual field; OCT optical coherence tomography; IOP intraocular pressure; BRVO Branch retinal vein occlusion; CRVO central retinal vein occlusion; CRAO central retinal artery occlusion; BRAO branch retinal artery occlusion; RT retinal tear; SB scleral buckle; PPV pars plana vitrectomy; VH Vitreous hemorrhage; PRP panretinal laser photocoagulation; IVK intravitreal kenalog; VMT vitreomacular traction; MH Macular hole;  NVD neovascularization of the disc; NVE neovascularization elsewhere; AREDS age related eye disease study; ARMD age related macular degeneration; POAG  primary open angle glaucoma; EBMD epithelial/anterior basement membrane dystrophy; ACIOL anterior chamber intraocular lens; IOL intraocular lens; PCIOL posterior chamber intraocular lens; Phaco/IOL phacoemulsification with intraocular lens placement; [place] photorefractive  keratectomy; LASIK laser assisted in situ keratomileusis; HTN hypertension; DM diabetes mellitus; COPD chronic obstructive pulmonary disease

## 2018-05-25 ENCOUNTER — Ambulatory Visit (INDEPENDENT_AMBULATORY_CARE_PROVIDER_SITE_OTHER): Payer: Medicare Other | Admitting: Ophthalmology

## 2018-05-25 ENCOUNTER — Other Ambulatory Visit: Payer: Self-pay

## 2018-05-25 ENCOUNTER — Encounter (INDEPENDENT_AMBULATORY_CARE_PROVIDER_SITE_OTHER): Payer: Self-pay | Admitting: Ophthalmology

## 2018-05-25 DIAGNOSIS — H35033 Hypertensive retinopathy, bilateral: Secondary | ICD-10-CM

## 2018-05-25 DIAGNOSIS — H353221 Exudative age-related macular degeneration, left eye, with active choroidal neovascularization: Secondary | ICD-10-CM | POA: Diagnosis not present

## 2018-05-25 DIAGNOSIS — E119 Type 2 diabetes mellitus without complications: Secondary | ICD-10-CM

## 2018-05-25 DIAGNOSIS — Z961 Presence of intraocular lens: Secondary | ICD-10-CM

## 2018-05-25 DIAGNOSIS — H3581 Retinal edema: Secondary | ICD-10-CM | POA: Diagnosis not present

## 2018-05-25 DIAGNOSIS — H353112 Nonexudative age-related macular degeneration, right eye, intermediate dry stage: Secondary | ICD-10-CM

## 2018-05-25 DIAGNOSIS — I10 Essential (primary) hypertension: Secondary | ICD-10-CM

## 2018-05-25 MED ORDER — BEVACIZUMAB CHEMO INJECTION 1.25MG/0.05ML SYRINGE FOR KALEIDOSCOPE
1.2500 mg | INTRAVITREAL | Status: AC | PRN
  Administered 2018-05-25: 12:00:00 1.25 mg via INTRAVITREAL

## 2018-06-21 ENCOUNTER — Encounter: Payer: Self-pay | Admitting: Podiatry

## 2018-06-21 ENCOUNTER — Other Ambulatory Visit: Payer: Self-pay

## 2018-06-21 ENCOUNTER — Ambulatory Visit: Payer: Medicare Other | Admitting: Podiatry

## 2018-06-21 VITALS — Temp 97.7°F

## 2018-06-21 DIAGNOSIS — M79675 Pain in left toe(s): Secondary | ICD-10-CM | POA: Diagnosis not present

## 2018-06-21 DIAGNOSIS — M79674 Pain in right toe(s): Secondary | ICD-10-CM

## 2018-06-21 DIAGNOSIS — B351 Tinea unguium: Secondary | ICD-10-CM

## 2018-06-21 DIAGNOSIS — E1142 Type 2 diabetes mellitus with diabetic polyneuropathy: Secondary | ICD-10-CM | POA: Diagnosis not present

## 2018-06-21 DIAGNOSIS — L84 Corns and callosities: Secondary | ICD-10-CM | POA: Diagnosis not present

## 2018-06-21 NOTE — Patient Instructions (Signed)
Diabetes Mellitus and Foot Care  Foot care is an important part of your health, especially when you have diabetes. Diabetes may cause you to have problems because of poor blood flow (circulation) to your feet and legs, which can cause your skin to:   Become thinner and drier.   Break more easily.   Heal more slowly.   Peel and crack.  You may also have nerve damage (neuropathy) in your legs and feet, causing decreased feeling in them. This means that you may not notice minor injuries to your feet that could lead to more serious problems. Noticing and addressing any potential problems early is the best way to prevent future foot problems.  How to care for your feet  Foot hygiene   Wash your feet daily with warm water and mild soap. Do not use hot water. Then, pat your feet and the areas between your toes until they are completely dry. Do not soak your feet as this can dry your skin.   Trim your toenails straight across. Do not dig under them or around the cuticle. File the edges of your nails with an emery board or nail file.   Apply a moisturizing lotion or petroleum jelly to the skin on your feet and to dry, brittle toenails. Use lotion that does not contain alcohol and is unscented. Do not apply lotion between your toes.  Shoes and socks   Wear clean socks or stockings every day. Make sure they are not too tight. Do not wear knee-high stockings since they may decrease blood flow to your legs.   Wear shoes that fit properly and have enough cushioning. Always look in your shoes before you put them on to be sure there are no objects inside.   To break in new shoes, wear them for just a few hours a day. This prevents injuries on your feet.  Wounds, scrapes, corns, and calluses   Check your feet daily for blisters, cuts, bruises, sores, and redness. If you cannot see the bottom of your feet, use a mirror or ask someone for help.   Do not cut corns or calluses or try to remove them with medicine.   If you  find a minor scrape, cut, or break in the skin on your feet, keep it and the skin around it clean and dry. You may clean these areas with mild soap and water. Do not clean the area with peroxide, alcohol, or iodine.   If you have a wound, scrape, corn, or callus on your foot, look at it several times a day to make sure it is healing and not infected. Check for:  ? Redness, swelling, or pain.  ? Fluid or blood.  ? Warmth.  ? Pus or a bad smell.  General instructions   Do not cross your legs. This may decrease blood flow to your feet.   Do not use heating pads or hot water bottles on your feet. They may burn your skin. If you have lost feeling in your feet or legs, you may not know this is happening until it is too late.   Protect your feet from hot and cold by wearing shoes, such as at the beach or on hot pavement.   Schedule a complete foot exam at least once a year (annually) or more often if you have foot problems. If you have foot problems, report any cuts, sores, or bruises to your health care provider immediately.  Contact a health care provider if:     You have a medical condition that increases your risk of infection and you have any cuts, sores, or bruises on your feet.   You have an injury that is not healing.   You have redness on your legs or feet.   You feel burning or tingling in your legs or feet.   You have pain or cramps in your legs and feet.   Your legs or feet are numb.   Your feet always feel cold.   You have pain around a toenail.  Get help right away if:   You have a wound, scrape, corn, or callus on your foot and:  ? You have pain, swelling, or redness that gets worse.  ? You have fluid or blood coming from the wound, scrape, corn, or callus.  ? Your wound, scrape, corn, or callus feels warm to the touch.  ? You have pus or a bad smell coming from the wound, scrape, corn, or callus.  ? You have a fever.  ? You have a red line going up your leg.  Summary   Check your feet every day  for cuts, sores, red spots, swelling, and blisters.   Moisturize feet and legs daily.   Wear shoes that fit properly and have enough cushioning.   If you have foot problems, report any cuts, sores, or bruises to your health care provider immediately.   Schedule a complete foot exam at least once a year (annually) or more often if you have foot problems.  This information is not intended to replace advice given to you by your health care provider. Make sure you discuss any questions you have with your health care provider.  Document Released: 01/09/2000 Document Revised: 02/23/2017 Document Reviewed: 02/13/2016  [employer]vier Interactive Patient Education  2019 [employer]

## 2018-06-25 NOTE — Progress Notes (Signed)
Subjective: Sue [name] presents for preventative diabetic foot care on today's visit.  It is been approximately 3 months since her last visit.  She has received her diabetic shoes and is pleased with them.  She has history of ulceration submetatarsal head 2 of the right foot.  Her ulcer has healed and she reports no problems on today's visit.    Merrilee Seashore, MD is her PCP.  Last seen 3 to 4 months ago.   Current Outpatient Medications:  .  amLODipine (NORVASC) 10 MG tablet, Take 10 mg by mouth daily. , Disp: , Rfl:  .  B-D ULTRAFINE III SHORT PEN 31G X 8 MM MISC, USE AS DIRECTED ONCE DAILY SUBCUTANEOUSLY, Disp: , Rfl: 11 .  calcium-vitamin D (OSCAL WITH D) 500-200 MG-UNIT tablet, Take 1 tablet by mouth daily at 12 noon., Disp: , Rfl:  .  Cobalamin Combinations (B-12) [phone] MCG SUBL, Take 1 tablet by mouth daily. , Disp: , Rfl:  .  enalapril (VASOTEC) 10 MG tablet, Take 10 mg by mouth 2 (two) times daily. , Disp: , Rfl:  .  enalapril (VASOTEC) 20 MG tablet, , Disp: , Rfl:  .  esomeprazole (NEXIUM) 20 MG capsule, Take 20 mg by mouth daily., Disp: , Rfl:  .  glipiZIDE (GLUCOTROL) 10 MG tablet, Take 10 mg by mouth 2 (two) times daily before a meal., Disp: , Rfl:  .  glucose blood test strip, OneTouch Ultra Test strips  TEST ONCE A DAY ONCE A DAY FINGERSTICK 90, Disp: , Rfl:  .  hydrochlorothiazide (HYDRODIURIL) 25 MG tablet, Take 25 mg by mouth every morning. , Disp: , Rfl:  .  HYDROcodone-acetaminophen (NORCO) 5-325 MG tablet, Take 1 tablet by mouth every 6 (six) hours as needed for moderate pain., Disp: 15 tablet, Rfl: 0 .  Insulin Pen Needle (PEN NEEDLES) 32G X 4 MM MISC, BD Ultra-Fine Nano Pen Needle 32 gauge x 5/32"  USE AS DIRECTED DAILY, Disp: , Rfl:  .  Lancets (ONETOUCH ULTRASOFT) lancets, OneTouch UltraSoft Lancets  USE TWICE DAILY, Disp: , Rfl:  .  Multiple Vitamin (MULTIVITAMIN WITH MINERALS) TABS tablet, Take 1 tablet by mouth daily., Disp: , Rfl:  .  Multiple  Vitamins-Minerals (EYE VITAMINS & MINERALS) TABS, Take 1 tablet by mouth daily., Disp: , Rfl:  .  nortriptyline (PAMELOR) 25 MG capsule, Take 25 mg by mouth at bedtime., Disp: , Rfl:  .  Omega-3 Fatty Acids (FISH OIL) 1000 MG CAPS, Take 1,000 mg by mouth daily at 12 noon. , Disp: , Rfl:  .  omeprazole (PRILOSEC) 20 MG capsule, , Disp: , Rfl:  .  ONE TOUCH ULTRA TEST test strip, , Disp: , Rfl:  .  Polyethyl Glycol-Propyl Glycol (SYSTANE) 0.4-0.3 % GEL ophthalmic gel, Place 1 application into both eyes 3 (three) times daily., Disp: , Rfl:  .  PRESCRIPTION MEDICATION, Inject as directed once., Disp: , Rfl:  .  simvastatin (ZOCOR) 20 MG tablet, Take 20 mg by mouth daily., Disp: , Rfl:  .  TRESIBA FLEXTOUCH 200 UNIT/ML SOPN, 22 UNITS ONCE A DAY IN THE EVENING, Disp: , Rfl: 6  Current Facility-Administered Medications:  .  Bevacizumab (AVASTIN) SOLN 1.25 mg, 1.25 mg, Intravitreal, , Bernarda Caffey, MD, 1.25 mg at 03/01/18 1344 .  Bevacizumab (AVASTIN) SOLN 1.25 mg, 1.25 mg, Intravitreal, , Bernarda Caffey, MD, 1.25 mg at 03/31/18 1449   No Known Allergies   Objective: Vitals:   06/21/18 1611  Temp: 97.7 F (36.5 C)  Physical Examination:  Vascular  Examination: Capillary refill time less than 3 seconds x 10 digits.  Palpable DP/PT pulses b/l.  Digital hair absent bilaterally.  Trace pedal edema noted b/l.  Skin temperature gradient WNL b/l.  Dermatological Examination: Skin with normal turgor, texture and tone b/l.  No open wounds b/l.  No interdigital macerations noted b/l.  Elongated, thick, discolored brittle toenails with subungual debris and pain on dorsal palpation of nailbeds 1-5 b/l.  Hyperkeratotic submetatarsal head 2 right foot.  Noticeable improvement in appearance of lesion with use of diabetic shoes.  There is no erythema, no edema, no drainage, no flocculence noted.  Hyperkeratotic lesion noted distal tip right second digit.  No erythema, no edema, no drainage,  no flocculence noted with this lesion.  Musculoskeletal Examination: Muscle strength 5/5 to all muscle groups b/l.  Rigid contracted hammertoe right second digit  No pain, crepitus or joint discomfort with active/passive ROM.  Neurological Examination: Sensation diminished with 10 gram monofilament.  Vibratory sensation diminished bilaterally.  Assessment: 1. Mycotic nail infection with pain 1-5 b/l 2. Callus submetatarsal head 2 right foot 3. Corn distal tip second digit right foot 4. NIDDM with neuropathy  Plan: 1. Toenails 1-5 b/l were debrided in length and girth without iatrogenic laceration. 2. Callus submetatarsal head 2 right foot and corn distal tip second digit right foot pared with sterile scalpel blade without incident. 3. She is to continue wearing her diabetic shoe gear daily.  We did review the schedule that she is to follow in regards to her diabetic inserts.  She is to change them out every 4 months.  She received them on April 14, 2018.  On August 14, 2018, she is to place a new pair of diabetic insoles into her diabetic shoes.  On December 15, 2018, she is to replace her diabetic insoles with her final pair.  Daughter related understanding.   4. Report any pedal injuries to medical professional. 5. I do believe we are safe to have her follow-up in 3 months given the current COVID 19 pandemic.  They will call if they have any problems in the interim.   6. Patient/POA to call should there be a question/concern in there interim.

## 2018-06-27 NOTE — Progress Notes (Signed)
[facility] Note  06/28/2018     CHIEF COMPLAINT Patient presents for Retina Follow Up   HISTORY OF PRESENT ILLNESS: Sue [name] is a 85 y.o. female who presents to the clinic today for:   HPI    Retina Follow Up    Patient presents with  Wet AMD.  In left eye.  Severity is moderate.  Duration of 5 weeks.  Since onset it is stable.  I, the attending physician,  performed the HPI with the patient and updated documentation appropriately.          Comments    Patient states vision the same OU. BS was around 170 this am. Last a1c was 7 about 1 month ago.        Last edited by Bernarda Caffey, MD on 06/28/2018 10:40 AM. (History)    pt states she cannot tell any difference in her vision since last visit   Referring physician: Merrilee Seashore, [address], [place] [address]  HISTORICAL INFORMATION:   Selected notes from the MEDICAL RECORD NUMBER Referred by Dr. Quentin Ore for concern of ARMD OU LEE: 01.17.19 Read Drivers) [BCVA: OD: 20/30-1 OS: 20/40] Ocular Hx-floaters, ARMD OU, HTN ret OU, PVD OS, dry eye syndrome OU, Pseudophakia OU, pupillary miosis OU PMH-DM (taking Lantus, Januvia and Jardiance), HTN    CURRENT MEDICATIONS: Current Outpatient Medications (Ophthalmic Drugs)  Medication Sig  . Polyethyl Glycol-Propyl Glycol (SYSTANE) 0.4-0.3 % GEL ophthalmic gel Place 1 application into both eyes 3 (three) times daily.   No current facility-administered medications for this visit.  (Ophthalmic Drugs)   Current Outpatient Medications (Other)  Medication Sig  . amLODipine (NORVASC) 10 MG tablet Take 10 mg by mouth daily.   . B-D ULTRAFINE III SHORT PEN 31G X 8 MM MISC USE AS DIRECTED ONCE DAILY SUBCUTANEOUSLY  . calcium-vitamin D (OSCAL WITH D) 500-200 MG-UNIT tablet Take 1 tablet by mouth daily at 12 noon.  . Cobalamin Combinations (B-12) [phone] MCG SUBL Take 1 tablet by mouth daily.   . enalapril (VASOTEC)  10 MG tablet Take 10 mg by mouth 2 (two) times daily.   Marland Kitchen esomeprazole (NEXIUM) 20 MG capsule Take 20 mg by mouth daily.  Marland Kitchen glipiZIDE (GLUCOTROL) 10 MG tablet Take 10 mg by mouth 2 (two) times daily before a meal.  . glucose blood test strip OneTouch Ultra Test strips  TEST ONCE A DAY ONCE A DAY FINGERSTICK 90  . hydrochlorothiazide (HYDRODIURIL) 25 MG tablet Take 25 mg by mouth every morning.   Marland Kitchen HYDROcodone-acetaminophen (NORCO) 5-325 MG tablet Take 1 tablet by mouth every 6 (six) hours as needed for moderate pain.  . Insulin Pen Needle (PEN NEEDLES) 32G X 4 MM MISC BD Ultra-Fine Nano Pen Needle 32 gauge x 5/32"  USE AS DIRECTED DAILY  . Lancets (ONETOUCH ULTRASOFT) lancets OneTouch UltraSoft Lancets  USE TWICE DAILY  . Multiple Vitamin (MULTIVITAMIN WITH MINERALS) TABS tablet Take 1 tablet by mouth daily.  . Multiple Vitamins-Minerals (EYE VITAMINS & MINERALS) TABS Take 1 tablet by mouth daily.  . nortriptyline (PAMELOR) 25 MG capsule Take 25 mg by mouth at bedtime.  . Omega-3 Fatty Acids (FISH OIL) 1000 MG CAPS Take 1,000 mg by mouth daily at 12 noon.   Marland Kitchen omeprazole (PRILOSEC) 20 MG capsule   . ONE TOUCH ULTRA TEST test strip   . PRESCRIPTION MEDICATION Inject as directed once.  . simvastatin (ZOCOR) 20 MG tablet Take 20 mg  by mouth daily.  . TRESIBA FLEXTOUCH 200 UNIT/ML SOPN 22 UNITS ONCE A DAY IN THE EVENING  . enalapril (VASOTEC) 20 MG tablet    Current Facility-Administered Medications (Other)  Medication Route  . Bevacizumab (AVASTIN) SOLN 1.25 mg Intravitreal  . Bevacizumab (AVASTIN) SOLN 1.25 mg Intravitreal      REVIEW OF SYSTEMS: ROS    Positive for: Endocrine, Eyes   Negative for: Constitutional, Gastrointestinal, Neurological, Skin, Genitourinary, Musculoskeletal, HENT, Cardiovascular, Respiratory, Psychiatric, Allergic/Imm, Heme/Lymph   Last edited by Roselee Nova D on 06/28/2018 10:14 AM. (History)       ALLERGIES No Known Allergies  PAST MEDICAL  HISTORY Past Medical History:  Diagnosis Date  . Diabetes mellitus without complication ([place])   . Hypertension   . Hypertensive retinopathy    OU  . Macular degeneration    OU   Past Surgical History:  Procedure Laterality Date  . ABDOMINAL HYSTERECTOMY    . CATARACT EXTRACTION    . CHOLECYSTECTOMY    . EYE SURGERY    . SPINE SURGERY      FAMILY HISTORY History reviewed. No pertinent family history.  SOCIAL HISTORY Social History   Tobacco Use  . Smoking status: Former Research scientist (life sciences)  . Smokeless tobacco: Never Used  Substance Use Topics  . Alcohol use: No    Alcohol/week: 0.0 standard drinks  . Drug use: No         OPHTHALMIC EXAM:  Base Eye Exam    Visual Acuity (Snellen - Linear)      Right Left   Dist cc 20/30 -2 20/50 +2   Dist ph cc 20/30 20/40 +2   Correction:  Glasses       Tonometry (Tonopen, 10:25 AM)      Right Left   Pressure 15 15       Pupils      Dark Light Shape React APD   Right 4 3 Round Brisk None   Left 4 3 Round Brisk None       Visual Fields (Counting fingers)      Left Right    Full Full       Extraocular Movement      Right Left    Full, Ortho Full, Ortho       Neuro/Psych    Oriented x3:  Yes   Mood/Affect:  Normal       Dilation    Both eyes:  1.0% Mydriacyl, 2.5% Phenylephrine @ 10:25 AM        Slit Lamp and Fundus Exam    Slit Lamp Exam      Right Left   Lids/Lashes Dermatochalasis - upper lid, Telangiectasia, mild MGD Dermatochalasis - upper lid, Telangiectasia, Meibomian gland dysfunction   Conjunctiva/Sclera White and quiet White and quiet   Cornea Temporal Well healed cataract wounds, mild Arcus, trace endo pigment nasally, 1+PEE Arcus, Temporal Well healed cataract wounds, 1-2+ PEE   Anterior Chamber Deep and quiet Deep and quiet   Iris Round and moderately dilated to 4.58m Round and moderately dilated to 552m Iris atrophy from 0100 to 0230, peripheral irodotimy at 0200   Lens 3-piece PC IOL in good  position PC IOL in good position   Vitreous Vitreous syneresis, Posterior vitreous detachment, Weiss ring Vitreous syneresis, PVD       Fundus Exam      Right Left   Disc 360 Peripapillary atrophy, diffuse mild pallor, compact, sharp rim mild pallor, Peripapillary atrophy and pigmentation   C/D Ratio 0.3  0.3   Macula Blunted foveal reflex, +PED, RPE mottling and clumping, Drusen, No heme or edema Blunted foveal reflex, +PED, RPE mottling and clumping, Drusen, No heme or edema, +CNVM with interval resolution of overlying, shallow SRF    Vessels Vascular attenuation Vascular attenuation   Periphery Attached, 360 Reticular degeneration Attached, 360 Reticular degeneration        Refraction    Wearing Rx      Sphere Cylinder Axis   Right -1.50 +0.75 180   Left -0.50 +0.75 180       Manifest Refraction      Sphere Cylinder Axis Dist VA   Right -1.00 +0.75 180 20/30-1   Left [place] +0.75 180 20/30-1          IMAGING AND PROCEDURES  Imaging and Procedures for _0 @  OCT, Retina - OU - Both Eyes       Right Eye Quality was good. Central Foveal Thickness: 236. Progression has been stable. Findings include normal foveal contour, no IRF, no SRF, pigment epithelial detachment, outer retinal atrophy, retinal drusen .   Left Eye Quality was good. Central Foveal Thickness: 239. Progression has improved. Findings include no IRF, retinal drusen , pigment epithelial detachment, outer retinal atrophy, epiretinal membrane, abnormal foveal contour, no SRF (Interval resolution of SRF; partial PVD).   Notes *Images captured and stored on drive  Diagnosis / Impression:  OD: Non-Exudative ARMD OS: Exudative ARMD -- interval resolution of SRF   Clinical management:  See below  Abbreviations: NFP - Normal foveal profile. CME - cystoid macular edema. PED - pigment epithelial detachment. IRF - intraretinal fluid. SRF - subretinal fluid. EZ - ellipsoid zone. ERM - epiretinal membrane. ORA -  outer retinal atrophy. ORT - outer retinal tubulation. SRHM - subretinal hyper-reflective material         Intravitreal Injection, Pharmacologic Agent - OS - Left Eye       Time Out 06/28/2018. 11:03 AM. Confirmed correct patient, procedure, site, and patient consented.   Anesthesia Topical anesthesia was used. Anesthetic medications included Lidocaine 2%, Proparacaine 0.5%.   Procedure Preparation included 5% betadine to ocular surface, eyelid speculum. A supplied needle was used.   Injection:  1.25 mg Bevacizumab (AVASTIN) SOLN   NDC: 80998-338-25, Lot: [id number] , Expiration date: 10/25/2018   Route: Intravitreal, Site: Left Eye, Waste: 0 mL  Post-op Post injection exam found visual acuity of at least counting fingers. The patient tolerated the procedure well. There were no complications. The patient received written and verbal post procedure care education.                 ASSESSMENT/PLAN:    ICD-10-CM   1. Exudative age-related macular degeneration of left eye with active choroidal neovascularization (HCC) H35.3221 Intravitreal Injection, Pharmacologic Agent - OS - Left Eye    Bevacizumab (AVASTIN) SOLN 1.25 mg  2. 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 Diabetes mellitus type 2 without retinopathy ([place]) E11.9   4. Hypertensive retinopathy of both eyes H35.033   5. Essential hypertension I10   6. Retinal edema H35.81 OCT, Retina - OU - Both Eyes  7. Pseudophakia of both eyes Z96.1    1. Exudative age-related macular degeneration, left eye  - conversion of OS from nonexudative to exudative ARMD prior to 02.05.20 visit  - s/p IVA OS #1 (02.05.20), #2 (03.04.20), #3 (04.30.20)  - OCT shows interval resolution of SRF overlying PED  - BCVA slightly decreased to 20/40 from 20/30 -  2  - recommend IVA #4 OS today 06.02.2020 for maintenance treatment w/ extension to 5-6 wks  - risks and benefits of intravitreal avastin  discussed with patient  - pt wishes to be treated with IVA  - informed consent obtained and signed  - see procedure note  - f/u in 5-6 wks for DFE, OCT, likely injection OS   2. Age related macular degeneration, non-exudative, right eye  - stable, former pt of Dr. Zigmund Daniel -- no significant change in OCT or exam  - The incidence, anatomy, and pathology of dry AMD, risk of progression, and the AREDS and AREDS 2 study including smoking risks discussed with patient.  -  recommend amsler grid monitoring  3. Diabetes mellitus, type 2 without retinopathy  - The incidence, risk factors for progression, natural history and treatment options for diabetic retinopathy  were discussed with patient.    - The need for close monitoring of blood glucose, blood pressure, and serum lipids, avoiding cigarette or any type of tobacco, and the need for long term follow up was also discussed with patient.  - f/u 1 year   4,5. Hypertensive retinopathy OU  - discussed importance of tight BP control  - monitor  6. No retinal edema on exam or OCT  7. Pseudophakia OU  - s/p CE/IOL OU  - beautiful surgeries, doing well  - monitor   Ophthalmic Meds Ordered this visit:  Meds ordered this encounter  Medications  . Bevacizumab (AVASTIN) SOLN 1.25 mg       Return if symptoms worsen or fail to improve, for f/u 5-6 weeks exu ARMD OS.  There are no Patient Instructions on file for this visit.   Explained the diagnoses, plan, and follow up with the patient and they expressed understanding.  Patient expressed understanding of the importance of proper follow up care.   This document serves as a record of services personally performed by Gardiner Sleeper, MD, PhD. It was created on their behalf by Ernest Mallick, OA, an ophthalmic assistant. The creation of this record is the provider's dictation and/or activities during the visit.    Electronically signed by: Ernest Mallick, OA  06.02.2020 11:09 AM    Gardiner Sleeper, M.D., Ph.D. Diseases & Surgery of the Retina and Vitreous [facility]  I have reviewed the above documentation for accuracy and completeness, and I agree with the above. Gardiner Sleeper, M.D., Ph.D. 06/28/18 11:09 AM    Abbreviations: M myopia (nearsighted); A astigmatism; H hyperopia (farsighted); P presbyopia; Mrx spectacle prescription;  CTL contact lenses; OD right eye; OS left eye; OU both eyes  XT exotropia; ET esotropia; PEK punctate epithelial keratitis; PEE punctate epithelial erosions; DES dry eye syndrome; MGD meibomian gland dysfunction; ATs artificial tears; PFAT's preservative free artificial tears; [place] nuclear sclerotic cataract; PSC posterior subcapsular cataract; ERM epi-retinal membrane; PVD posterior vitreous detachment; RD retinal detachment; DM diabetes mellitus; DR diabetic retinopathy; NPDR non-proliferative diabetic retinopathy; PDR proliferative diabetic retinopathy; CSME clinically significant macular edema; DME diabetic macular edema; dbh dot blot hemorrhages; CWS cotton wool spot; POAG primary open angle glaucoma; C/D cup-to-disc ratio; HVF humphrey visual field; GVF goldmann visual field; OCT optical coherence tomography; IOP intraocular pressure; BRVO Branch retinal vein occlusion; CRVO central retinal vein occlusion; CRAO central retinal artery occlusion; BRAO branch retinal artery occlusion; RT retinal tear; SB scleral buckle; PPV pars plana vitrectomy; VH Vitreous hemorrhage; PRP panretinal laser photocoagulation; IVK intravitreal kenalog; VMT vitreomacular traction; MH Macular hole;  NVD neovascularization of the disc; NVE neovascularization elsewhere; AREDS age related eye disease study; ARMD age related macular degeneration; POAG primary open angle glaucoma; EBMD epithelial/anterior basement membrane dystrophy; ACIOL anterior chamber intraocular lens; IOL intraocular lens; PCIOL posterior chamber intraocular lens; Phaco/IOL  phacoemulsification with intraocular lens placement; [place] photorefractive keratectomy; LASIK laser assisted in situ keratomileusis; HTN hypertension; DM diabetes mellitus; COPD chronic obstructive pulmonary disease

## 2018-06-28 ENCOUNTER — Encounter (INDEPENDENT_AMBULATORY_CARE_PROVIDER_SITE_OTHER): Payer: Self-pay | Admitting: Ophthalmology

## 2018-06-28 ENCOUNTER — Other Ambulatory Visit: Payer: Self-pay

## 2018-06-28 ENCOUNTER — Ambulatory Visit (INDEPENDENT_AMBULATORY_CARE_PROVIDER_SITE_OTHER): Payer: Medicare Other | Admitting: Ophthalmology

## 2018-06-28 DIAGNOSIS — H35033 Hypertensive retinopathy, bilateral: Secondary | ICD-10-CM | POA: Diagnosis not present

## 2018-06-28 DIAGNOSIS — Z961 Presence of intraocular lens: Secondary | ICD-10-CM

## 2018-06-28 DIAGNOSIS — H353112 Nonexudative age-related macular degeneration, right eye, intermediate dry stage: Secondary | ICD-10-CM

## 2018-06-28 DIAGNOSIS — E119 Type 2 diabetes mellitus without complications: Secondary | ICD-10-CM | POA: Diagnosis not present

## 2018-06-28 DIAGNOSIS — H353221 Exudative age-related macular degeneration, left eye, with active choroidal neovascularization: Secondary | ICD-10-CM

## 2018-06-28 DIAGNOSIS — H3581 Retinal edema: Secondary | ICD-10-CM | POA: Diagnosis not present

## 2018-06-28 DIAGNOSIS — I10 Essential (primary) hypertension: Secondary | ICD-10-CM

## 2018-06-28 MED ORDER — BEVACIZUMAB CHEMO INJECTION 1.25MG/0.05ML SYRINGE FOR KALEIDOSCOPE
1.2500 mg | INTRAVITREAL | Status: AC | PRN
  Administered 2018-06-28: 1.25 mg via INTRAVITREAL

## 2018-08-02 ENCOUNTER — Encounter (INDEPENDENT_AMBULATORY_CARE_PROVIDER_SITE_OTHER): Payer: Medicare Other | Admitting: Ophthalmology

## 2018-08-07 ENCOUNTER — Encounter (INDEPENDENT_AMBULATORY_CARE_PROVIDER_SITE_OTHER): Payer: Medicare Other | Admitting: Ophthalmology

## 2018-08-07 NOTE — Progress Notes (Signed)
[facility] Note  08/08/2018     CHIEF COMPLAINT Patient presents for Retina Follow Up   HISTORY OF PRESENT ILLNESS: Sue [name] is a 85 y.o. female who presents to the clinic today for:   HPI    Retina Follow Up    Patient presents with  Wet AMD.  In left eye.  Since onset it is gradually improving.  I, the attending physician,  performed the HPI with the patient and updated documentation appropriately.          Comments    BS: 134 this AM Last HgA1c:7.0 Patient states her vision is improving in both eyes.  She had a single floater OS after last injection but states it went away on its own.  Patient denies any new or worsening floaters or flashes of light.  Patient denies any eye pain or discomfort.       Last edited by Bernarda Caffey, MD on 08/08/2018  3:08 PM. (History)    pt states she had a small floater after her last injection, but it went away after a couple of days   Referring physician: Merrilee Seashore, [address] [address] [place],  [place] [address]  HISTORICAL INFORMATION:   Selected notes from the MEDICAL RECORD NUMBER Referred by Dr. Quentin Ore for concern of ARMD OU LEE: 01.17.19 Read Drivers) [BCVA: OD: 20/30-1 OS: 20/40] Ocular Hx-floaters, ARMD OU, HTN ret OU, PVD OS, dry eye syndrome OU, Pseudophakia OU, pupillary miosis OU PMH-DM (taking Lantus, Januvia and Jardiance), HTN    CURRENT MEDICATIONS: Current Outpatient Medications (Ophthalmic Drugs)  Medication Sig  . Polyethyl Glycol-Propyl Glycol (SYSTANE) 0.4-0.3 % GEL ophthalmic gel Place 1 application into both eyes 3 (three) times daily.   No current facility-administered medications for this visit.  (Ophthalmic Drugs)   Current Outpatient Medications (Other)  Medication Sig  . amLODipine (NORVASC) 10 MG tablet Take 10 mg by mouth daily.   . B-D ULTRAFINE III SHORT PEN 31G X 8 MM MISC USE AS DIRECTED ONCE DAILY SUBCUTANEOUSLY  . calcium-vitamin D  (OSCAL WITH D) 500-200 MG-UNIT tablet Take 1 tablet by mouth daily at 12 noon.  . Cobalamin Combinations (B-12) [id number] MCG SUBL Take 1 tablet by mouth daily.   . enalapril (VASOTEC) 10 MG tablet Take 10 mg by mouth 2 (two) times daily.   . enalapril (VASOTEC) 20 MG tablet   . esomeprazole (NEXIUM) 20 MG capsule Take 20 mg by mouth daily.  Marland Kitchen glipiZIDE (GLUCOTROL) 10 MG tablet Take 10 mg by mouth 2 (two) times daily before a meal.  . glucose blood test strip OneTouch Ultra Test strips  TEST ONCE A DAY ONCE A DAY FINGERSTICK 90  . hydrochlorothiazide (HYDRODIURIL) 25 MG tablet Take 25 mg by mouth every morning.   Marland Kitchen HYDROcodone-acetaminophen (NORCO) 5-325 MG tablet Take 1 tablet by mouth every 6 (six) hours as needed for moderate pain.  . Insulin Pen Needle (PEN NEEDLES) 32G X 4 MM MISC BD Ultra-Fine Nano Pen Needle 32 gauge x 5/32"  USE AS DIRECTED DAILY  . Lancets (ONETOUCH ULTRASOFT) lancets OneTouch UltraSoft Lancets  USE TWICE DAILY  . Multiple Vitamin (MULTIVITAMIN WITH MINERALS) TABS tablet Take 1 tablet by mouth daily.  . Multiple Vitamins-Minerals (EYE VITAMINS & MINERALS) TABS Take 1 tablet by mouth daily.  . nortriptyline (PAMELOR) 25 MG capsule Take 25 mg by mouth at bedtime.  . Omega-3 Fatty Acids (FISH OIL) 1000 MG CAPS Take 1,000 mg by  mouth daily at 12 noon.   Marland Kitchen omeprazole (PRILOSEC) 20 MG capsule   . ONE TOUCH ULTRA TEST test strip   . PRESCRIPTION MEDICATION Inject as directed once.  . simvastatin (ZOCOR) 20 MG tablet Take 20 mg by mouth daily.  . TRESIBA FLEXTOUCH 200 UNIT/ML SOPN 22 UNITS ONCE A DAY IN THE EVENING   Current Facility-Administered Medications (Other)  Medication Route  . Bevacizumab (AVASTIN) SOLN 1.25 mg Intravitreal  . Bevacizumab (AVASTIN) SOLN 1.25 mg Intravitreal      REVIEW OF SYSTEMS: ROS    Positive for: Endocrine, Eyes   Negative for: Constitutional, Gastrointestinal, Neurological, Skin, Genitourinary, Musculoskeletal, HENT, Cardiovascular,  Respiratory, Psychiatric, Allergic/Imm, Heme/Lymph   Last edited by Doneen Poisson on 08/08/2018  2:12 PM. (History)       ALLERGIES No Known Allergies  PAST MEDICAL HISTORY Past Medical History:  Diagnosis Date  . Diabetes mellitus without complication ([address])   . Hypertension   . Hypertensive retinopathy    OU  . Macular degeneration    OU   Past Surgical History:  Procedure Laterality Date  . ABDOMINAL HYSTERECTOMY    . CATARACT EXTRACTION    . CHOLECYSTECTOMY    . EYE SURGERY    . SPINE SURGERY      FAMILY HISTORY History reviewed. No pertinent family history.  SOCIAL HISTORY Social History   Tobacco Use  . Smoking status: Former Research scientist (life sciences)  . Smokeless tobacco: Never Used  Substance Use Topics  . Alcohol use: No    Alcohol/week: 0.0 standard drinks  . Drug use: No         OPHTHALMIC EXAM:  Base Eye Exam    Visual Acuity (Snellen - Linear)      Right Left   Dist cc 20/30 -3 20/40   Dist ph cc 20/30 -2        Tonometry (Tonopen, 2:15 PM)      Right Left   Pressure 15 14       Pupils      Dark Light Shape React APD   Right 4 3 Round Brisk 0   Left 4 3 Round Brisk 0       Extraocular Movement      Right Left    Full Full       Neuro/Psych    Oriented x3: Yes   Mood/Affect: Normal       Dilation    Both eyes: 1.0% Mydriacyl, 2.5% Phenylephrine @ 2:15 PM        Slit Lamp and Fundus Exam    Slit Lamp Exam      Right Left   Lids/Lashes Dermatochalasis - upper lid, Telangiectasia, mild MGD Dermatochalasis - upper lid, Telangiectasia, Meibomian gland dysfunction   Conjunctiva/Sclera White and quiet White and quiet   Cornea Temporal Well healed cataract wounds, mild Arcus, trace endo pigment nasally, 1+PEE Arcus, Temporal Well healed cataract wounds, 1-2+ PEE   Anterior Chamber Deep and quiet Deep and quiet   Iris Round and moderately dilated to 4.66m Round and moderately dilated to 565m Iris atrophy from 0100 to 0230, peripheral irodotimy  at 0200   Lens 3-piece PC IOL in good position PC IOL in good position   Vitreous Vitreous syneresis, Posterior vitreous detachment, Weiss ring Vitreous syneresis, PVD       Fundus Exam      Right Left   Disc 360 Peripapillary atrophy, diffuse mild pallor, compact, sharp rim mild pallor, Peripapillary atrophy and pigmentation   C/D Ratio  0.2 0.3   Macula Blunted foveal reflex, +PED, RPE mottling and clumping, Drusen, No heme or edema Blunted foveal reflex, +PED, RPE mottling and clumping, Drusen, No heme or edema, +CNVM with stable resolution of overlying, shallow SRF    Vessels Vascular attenuation Vascular attenuation   Periphery Attached, 360 Reticular degeneration Attached, 360 Reticular degeneration        Refraction    Wearing Rx      Sphere Cylinder Axis   Right -1.50 +0.75 180   Left -0.50 +0.75 180          IMAGING AND PROCEDURES  Imaging and Procedures for '@TODAY' @  OCT, Retina - OU - Both Eyes       Right Eye Quality was good. Central Foveal Thickness: 236. Progression has been stable. Findings include normal foveal contour, no IRF, no SRF, pigment epithelial detachment, outer retinal atrophy, retinal drusen .   Left Eye Quality was borderline. Central Foveal Thickness: 261. Progression has been stable. Findings include no IRF, retinal drusen , pigment epithelial detachment, outer retinal atrophy, epiretinal membrane, abnormal foveal contour, no SRF (stable resolution of SRF; persistent low lying PVD).   Notes *Images captured and stored on drive  Diagnosis / Impression:  OD: Non-Exudative ARMD -- stable OS: Exudative ARMD -- stable resolution of SRF   Clinical management:  See below  Abbreviations: NFP - Normal foveal profile. CME - cystoid macular edema. PED - pigment epithelial detachment. IRF - intraretinal fluid. SRF - subretinal fluid. EZ - ellipsoid zone. ERM - epiretinal membrane. ORA - outer retinal atrophy. ORT - outer retinal tubulation. SRHM -  subretinal hyper-reflective material         Intravitreal Injection, Pharmacologic Agent - OS - Left Eye       Time Out 08/08/2018. 3:15 PM. Confirmed correct patient, procedure, site, and patient consented.   Anesthesia Topical anesthesia was used. Anesthetic medications included Lidocaine 2%, Proparacaine 0.5%.   Procedure Preparation included 5% betadine to ocular surface, eyelid speculum. A supplied needle was used.   Injection:  1.25 mg Bevacizumab (AVASTIN) SOLN   NDC: 50569-794-80, Lot: [id number]' , Expiration date: 10/04/2018   Route: Intravitreal, Site: Left Eye, Waste: 0 mL  Post-op Post injection exam found visual acuity of at least counting fingers. The patient tolerated the procedure well. There were no complications. The patient received written and verbal post procedure care education.                 ASSESSMENT/PLAN:    ICD-10-CM   1. Exudative age-related macular degeneration of left eye with active choroidal neovascularization (HCC)  H35.3221 Intravitreal Injection, Pharmacologic Agent - OS - Left Eye    Bevacizumab (AVASTIN) SOLN 1.25 mg  2. Intermediate stage nonexudative age-related macular degeneration of right eye  H35.3112   3. Diabetes mellitus type 2 without retinopathy ([place])  E11.9   4. Hypertensive retinopathy of both eyes  H35.033   5. Essential hypertension  I10   6. Retinal edema  H35.81 OCT, Retina - OU - Both Eyes  7. Pseudophakia of both eyes  Z96.1    1. Exudative age-related macular degeneration, left eye  - conversion of OS from nonexudative to exudative ARMD prior to 02.05.20 visit  - s/p IVA OS #1 (02.05.20), #2 (03.04.20), #3 (04.30.20), #4 (06.02.20)  - OCT shows stable resolution of SRF overlying PED with extension to ~6 wks  - BCVA improved to 20/40 OS  - recommend IVA #5 OS today 07.14.2020 for maintenance treatment w/ extension to  6-7 wks  - pt wishes to be treated with IVA  - risks and benefits of intravitreal avastin  discussed with patient  - informed consent obtained and signed  - see procedure note  - f/u in 6-7 wks for DFE, OCT, likely injection OS   2. Age related macular degeneration, non-exudative, right eye  - stable, former pt of Dr. Zigmund Daniel -- no significant change in OCT or exam  - The incidence, anatomy, and pathology of dry AMD, risk of progression, and the AREDS and AREDS 2 study including smoking risks discussed with patient.  -  recommend amsler grid monitoring  3. Diabetes mellitus, type 2 without retinopathy  - The incidence, risk factors for progression, natural history and treatment options for diabetic retinopathy  were discussed with patient.    - The need for close monitoring of blood glucose, blood pressure, and serum lipids, avoiding cigarette or any type of tobacco, and the need for long term follow up was also discussed with patient.  - f/u 1 year   4,5. Hypertensive retinopathy OU  - discussed importance of tight BP control  - monitor  6. No retinal edema on exam or OCT  7. Pseudophakia OU  - s/p CE/IOL OU  - beautiful surgeries, doing well  - monitor   Ophthalmic Meds Ordered this visit:  Meds ordered this encounter  Medications  . Bevacizumab (AVASTIN) SOLN 1.25 mg       Return for f/u 6-7 weeks, exu ARMD OS, DFE, OCT.  There are no Patient Instructions on file for this visit.   Explained the diagnoses, plan, and follow up with the patient and they expressed understanding.  Patient expressed understanding of the importance of proper follow up care.   This document serves as a record of services personally performed by Gardiner Sleeper, MD, PhD. It was created on their behalf by Ernest Mallick, OA, an ophthalmic assistant. The creation of this record is the provider's dictation and/or activities during the visit.    Electronically signed by: Ernest Mallick, OA  07.13.2020 5:37 PM    Gardiner Sleeper, M.D., Ph.D. Diseases & Surgery of the Retina and  Vitreous [facility]  I have reviewed the above documentation for accuracy and completeness, and I agree with the above. Gardiner Sleeper, M.D., Ph.D. 08/08/18 5:37 PM    Abbreviations: M myopia (nearsighted); A astigmatism; H hyperopia (farsighted); P presbyopia; Mrx spectacle prescription;  CTL contact lenses; OD right eye; OS left eye; OU both eyes  XT exotropia; ET esotropia; PEK punctate epithelial keratitis; PEE punctate epithelial erosions; DES dry eye syndrome; MGD meibomian gland dysfunction; ATs artificial tears; PFAT's preservative free artificial tears; [place] nuclear sclerotic cataract; PSC posterior subcapsular cataract; ERM epi-retinal membrane; PVD posterior vitreous detachment; RD retinal detachment; DM diabetes mellitus; DR diabetic retinopathy; NPDR non-proliferative diabetic retinopathy; PDR proliferative diabetic retinopathy; CSME clinically significant macular edema; DME diabetic macular edema; dbh dot blot hemorrhages; CWS cotton wool spot; POAG primary open angle glaucoma; C/D cup-to-disc ratio; HVF humphrey visual field; GVF goldmann visual field; OCT optical coherence tomography; IOP intraocular pressure; BRVO Branch retinal vein occlusion; CRVO central retinal vein occlusion; CRAO central retinal artery occlusion; BRAO branch retinal artery occlusion; RT retinal tear; SB scleral buckle; PPV pars plana vitrectomy; VH Vitreous hemorrhage; PRP panretinal laser photocoagulation; IVK intravitreal kenalog; VMT vitreomacular traction; MH Macular hole;  NVD neovascularization of the disc; NVE neovascularization elsewhere; AREDS age related eye disease study; ARMD age related macular degeneration;  POAG primary open angle glaucoma; EBMD epithelial/anterior basement membrane dystrophy; ACIOL anterior chamber intraocular lens; IOL intraocular lens; PCIOL posterior chamber intraocular lens; Phaco/IOL phacoemulsification with intraocular lens placement; [place] photorefractive  keratectomy; LASIK laser assisted in situ keratomileusis; HTN hypertension; DM diabetes mellitus; COPD chronic obstructive pulmonary disease

## 2018-08-08 ENCOUNTER — Ambulatory Visit (INDEPENDENT_AMBULATORY_CARE_PROVIDER_SITE_OTHER): Payer: Medicare Other | Admitting: Ophthalmology

## 2018-08-08 ENCOUNTER — Other Ambulatory Visit: Payer: Self-pay

## 2018-08-08 ENCOUNTER — Encounter (INDEPENDENT_AMBULATORY_CARE_PROVIDER_SITE_OTHER): Payer: Self-pay | Admitting: Ophthalmology

## 2018-08-08 DIAGNOSIS — H353221 Exudative age-related macular degeneration, left eye, with active choroidal neovascularization: Secondary | ICD-10-CM

## 2018-08-08 DIAGNOSIS — E119 Type 2 diabetes mellitus without complications: Secondary | ICD-10-CM

## 2018-08-08 DIAGNOSIS — H353112 Nonexudative age-related macular degeneration, right eye, intermediate dry stage: Secondary | ICD-10-CM | POA: Diagnosis not present

## 2018-08-08 DIAGNOSIS — Z961 Presence of intraocular lens: Secondary | ICD-10-CM

## 2018-08-08 DIAGNOSIS — H3581 Retinal edema: Secondary | ICD-10-CM | POA: Diagnosis not present

## 2018-08-08 DIAGNOSIS — I10 Essential (primary) hypertension: Secondary | ICD-10-CM

## 2018-08-08 DIAGNOSIS — H35033 Hypertensive retinopathy, bilateral: Secondary | ICD-10-CM | POA: Diagnosis not present

## 2018-08-08 MED ORDER — BEVACIZUMAB CHEMO INJECTION 1.25MG/0.05ML SYRINGE FOR KALEIDOSCOPE
1.2500 mg | INTRAVITREAL | Status: AC | PRN
  Administered 2018-08-08: 1.25 mg via INTRAVITREAL

## 2018-08-16 DIAGNOSIS — E1121 Type 2 diabetes mellitus with diabetic nephropathy: Secondary | ICD-10-CM | POA: Diagnosis not present

## 2018-08-16 DIAGNOSIS — E1142 Type 2 diabetes mellitus with diabetic polyneuropathy: Secondary | ICD-10-CM | POA: Diagnosis not present

## 2018-08-16 DIAGNOSIS — E782 Mixed hyperlipidemia: Secondary | ICD-10-CM | POA: Diagnosis not present

## 2018-08-16 DIAGNOSIS — Z0000 Encounter for general adult medical examination without abnormal findings: Secondary | ICD-10-CM | POA: Diagnosis not present

## 2018-08-16 DIAGNOSIS — Z7189 Other specified counseling: Secondary | ICD-10-CM | POA: Diagnosis not present

## 2018-08-16 DIAGNOSIS — N183 Chronic kidney disease, stage 3 (moderate): Secondary | ICD-10-CM | POA: Diagnosis not present

## 2018-08-16 DIAGNOSIS — E1165 Type 2 diabetes mellitus with hyperglycemia: Secondary | ICD-10-CM | POA: Diagnosis not present

## 2018-08-23 DIAGNOSIS — Z0000 Encounter for general adult medical examination without abnormal findings: Secondary | ICD-10-CM | POA: Diagnosis not present

## 2018-08-23 DIAGNOSIS — E118 Type 2 diabetes mellitus with unspecified complications: Secondary | ICD-10-CM | POA: Diagnosis not present

## 2018-08-23 DIAGNOSIS — E1165 Type 2 diabetes mellitus with hyperglycemia: Secondary | ICD-10-CM | POA: Diagnosis not present

## 2018-08-23 DIAGNOSIS — E1142 Type 2 diabetes mellitus with diabetic polyneuropathy: Secondary | ICD-10-CM | POA: Diagnosis not present

## 2018-08-23 DIAGNOSIS — E1121 Type 2 diabetes mellitus with diabetic nephropathy: Secondary | ICD-10-CM | POA: Diagnosis not present

## 2018-09-20 ENCOUNTER — Ambulatory Visit: Payer: Medicare Other | Admitting: Podiatry

## 2018-09-20 ENCOUNTER — Other Ambulatory Visit: Payer: Self-pay

## 2018-09-20 ENCOUNTER — Encounter: Payer: Self-pay | Admitting: Podiatry

## 2018-09-20 ENCOUNTER — Encounter (INDEPENDENT_AMBULATORY_CARE_PROVIDER_SITE_OTHER): Payer: Medicare Other | Admitting: Ophthalmology

## 2018-09-20 DIAGNOSIS — E1142 Type 2 diabetes mellitus with diabetic polyneuropathy: Secondary | ICD-10-CM

## 2018-09-20 DIAGNOSIS — M79675 Pain in left toe(s): Secondary | ICD-10-CM

## 2018-09-20 DIAGNOSIS — L84 Corns and callosities: Secondary | ICD-10-CM | POA: Diagnosis not present

## 2018-09-20 DIAGNOSIS — M79674 Pain in right toe(s): Secondary | ICD-10-CM

## 2018-09-20 DIAGNOSIS — B351 Tinea unguium: Secondary | ICD-10-CM | POA: Diagnosis not present

## 2018-09-20 NOTE — Patient Instructions (Signed)
Diabetes Mellitus and Foot Care Foot care is an important part of your health, especially when you have diabetes. Diabetes may cause you to have problems because of poor blood flow (circulation) to your feet and legs, which can cause your skin to:  Become thinner and drier.  Break more easily.  Heal more slowly.  Peel and crack. You may also have nerve damage (neuropathy) in your legs and feet, causing decreased feeling in them. This means that you may not notice minor injuries to your feet that could lead to more serious problems. Noticing and addressing any potential problems early is the best way to prevent future foot problems. How to care for your feet Foot hygiene  Wash your feet daily with warm water and mild soap. Do not use hot water. Then, pat your feet and the areas between your toes until they are completely dry. Do not soak your feet as this can dry your skin.  Trim your toenails straight across. Do not dig under them or around the cuticle. File the edges of your nails with an emery board or nail file.  Apply a moisturizing lotion or petroleum jelly to the skin on your feet and to dry, brittle toenails. Use lotion that does not contain alcohol and is unscented. Do not apply lotion between your toes. Shoes and socks  Wear clean socks or stockings every day. Make sure they are not too tight. Do not wear knee-high stockings since they may decrease blood flow to your legs.  Wear shoes that fit properly and have enough cushioning. Always look in your shoes before you put them on to be sure there are no objects inside.  To break in new shoes, wear them for just a few hours a day. This prevents injuries on your feet. Wounds, scrapes, corns, and calluses  Check your feet daily for blisters, cuts, bruises, sores, and redness. If you cannot see the bottom of your feet, use a mirror or ask someone for help.  Do not cut corns or calluses or try to remove them with medicine.  If you  find a minor scrape, cut, or break in the skin on your feet, keep it and the skin around it clean and dry. You may clean these areas with mild soap and water. Do not clean the area with peroxide, alcohol, or iodine.  If you have a wound, scrape, corn, or callus on your foot, look at it several times a day to make sure it is healing and not infected. Check for: ? Redness, swelling, or pain. ? Fluid or blood. ? Warmth. ? Pus or a bad smell. General instructions  Do not cross your legs. This may decrease blood flow to your feet.  Do not use heating pads or hot water bottles on your feet. They may burn your skin. If you have lost feeling in your feet or legs, you may not know this is happening until it is too late.  Protect your feet from hot and cold by wearing shoes, such as at the beach or on hot pavement.  Schedule a complete foot exam at least once a year (annually) or more often if you have foot problems. If you have foot problems, report any cuts, sores, or bruises to your health care provider immediately. Contact a health care provider if:  You have a medical condition that increases your risk of infection and you have any cuts, sores, or bruises on your feet.  You have an injury that is not   healing.  You have redness on your legs or feet.  You feel burning or tingling in your legs or feet.  You have pain or cramps in your legs and feet.  Your legs or feet are numb.  Your feet always feel cold.  You have pain around a toenail. Get help right away if:  You have a wound, scrape, corn, or callus on your foot and: ? You have pain, swelling, or redness that gets worse. ? You have fluid or blood coming from the wound, scrape, corn, or callus. ? Your wound, scrape, corn, or callus feels warm to the touch. ? You have pus or a bad smell coming from the wound, scrape, corn, or callus. ? You have a fever. ? You have a red line going up your leg. Summary  Check your feet every day  for cuts, sores, red spots, swelling, and blisters.  Moisturize feet and legs daily.  Wear shoes that fit properly and have enough cushioning.  If you have foot problems, report any cuts, sores, or bruises to your health care provider immediately.  Schedule a complete foot exam at least once a year (annually) or more often if you have foot problems. This information is not intended to replace advice given to you by your health care provider. Make sure you discuss any questions you have with your health care provider. Document Released: 01/09/2000 Document Revised: 02/23/2017 Document Reviewed: 02/13/2016 Elsevier Patient Education  2020 [employer]   Onychomycosis/Fungal Toenails  WHAT IS IT? An infection that lies within the keratin of your nail plate that is caused by a fungus.  WHY ME? Fungal infections affect all ages, sexes, races, and creeds.  There may be many factors that predispose you to a fungal infection such as age, coexisting medical conditions such as diabetes, or an autoimmune disease; stress, medications, fatigue, genetics, etc.  Bottom line: fungus thrives in a warm, moist environment and your shoes offer such a location.  IS IT CONTAGIOUS? Theoretically, yes.  You do not want to share shoes, nail clippers or files with someone who has fungal toenails.  Walking around barefoot in the same room or sleeping in the same bed is unlikely to transfer the organism.  It is important to realize, however, that fungus can spread easily from one nail to the next on the same foot.  HOW DO WE TREAT THIS?  There are several ways to treat this condition.  Treatment may depend on many factors such as age, medications, pregnancy, liver and kidney conditions, etc.  It is best to ask your doctor which options are available to you.  1. No treatment.   Unlike many other medical concerns, you can live with this condition.  However for many people this can be a painful condition and may lead to  ingrown toenails or a bacterial infection.  It is recommended that you keep the nails cut short to help reduce the amount of fungal nail. 2. Topical treatment.  These range from herbal remedies to prescription strength nail lacquers.  About 40-50% effective, topicals require twice daily application for approximately 9 to 12 months or until an entirely new nail has grown out.  The most effective topicals are medical grade medications available through physicians offices. 3. Oral antifungal medications.  With an 80-90% cure rate, the most common oral medication requires 3 to 4 months of therapy and stays in your system for a year as the new nail grows out.  Oral antifungal medications do require   blood work to make sure it is a safe drug for you.  A liver function panel will be performed prior to starting the medication and after the first month of treatment.  It is important to have the blood work performed to avoid any harmful side effects.  In general, this medication safe but blood work is required. 4. Laser Therapy.  This treatment is performed by applying a specialized laser to the affected nail plate.  This therapy is noninvasive, fast, and non-painful.  It is not covered by insurance and is therefore, out of pocket.  The results have been very good with a 80-95% cure rate.  [facility] is the only practice in the area to offer this therapy. 5. Permanent Nail Avulsion.  Removing the entire nail so that a new nail will not grow back. 

## 2018-09-25 ENCOUNTER — Encounter: Payer: Self-pay | Admitting: *Deleted

## 2018-09-25 NOTE — Progress Notes (Signed)
[facility] Note  09/26/2018     CHIEF COMPLAINT Patient presents for Retina Follow Up   HISTORY OF PRESENT ILLNESS: Sue [name] is a 85 y.o. female who presents to the clinic today for:   HPI    Retina Follow Up    Patient presents with  Wet AMD.  In left eye.  This started weeks ago.  Severity is moderate.  Duration of weeks.  Since onset it is stable.  I, the attending physician,  performed the HPI with the patient and updated documentation appropriately.          Comments    Patient states her vision is about the same.  Patient denies eye pain or discomfort and denies any new or worsening floaters or fol OU.       Last edited by Bernarda Caffey, MD on 09/26/2018  9:16 PM. (History)       Referring physician: Merrilee Seashore, [address] [place],  [place] [address]  HISTORICAL INFORMATION:   Selected notes from the MEDICAL RECORD NUMBER Referred by Dr. Quentin Ore for concern of ARMD OU LEE: 01.17.19 Read Drivers) [BCVA: OD: 20/30-1 OS: 20/40] Ocular Hx-floaters, ARMD OU, HTN ret OU, PVD OS, dry eye syndrome OU, Pseudophakia OU, pupillary miosis OU PMH-DM (taking Lantus, Januvia and Jardiance), HTN    CURRENT MEDICATIONS: Current Outpatient Medications (Ophthalmic Drugs)  Medication Sig  . Polyethyl Glycol-Propyl Glycol (SYSTANE) 0.4-0.3 % GEL ophthalmic gel Place 1 application into both eyes 3 (three) times daily.   No current facility-administered medications for this visit.  (Ophthalmic Drugs)   Current Outpatient Medications (Other)  Medication Sig  . amLODipine (NORVASC) 10 MG tablet Take 10 mg by mouth daily.   . B-D ULTRAFINE III SHORT PEN 31G X 8 MM MISC USE AS DIRECTED ONCE DAILY SUBCUTANEOUSLY  . calcium-vitamin D (OSCAL WITH D) 500-200 MG-UNIT tablet Take 1 tablet by mouth daily at 12 noon.  . Cobalamin Combinations (B-12) [phone] MCG SUBL Take 1 tablet by mouth daily.   . enalapril (VASOTEC) 10 MG  tablet Take 10 mg by mouth 2 (two) times daily.   . enalapril (VASOTEC) 20 MG tablet   . esomeprazole (NEXIUM) 20 MG capsule Take 20 mg by mouth daily.  Marland Kitchen glipiZIDE (GLUCOTROL) 10 MG tablet Take 10 mg by mouth 2 (two) times daily before a meal.  . glucose blood test strip OneTouch Ultra Test strips  TEST ONCE A DAY ONCE A DAY FINGERSTICK 90  . hydrochlorothiazide (HYDRODIURIL) 25 MG tablet Take 25 mg by mouth every morning.   Marland Kitchen HYDROcodone-acetaminophen (NORCO) 5-325 MG tablet Take 1 tablet by mouth every 6 (six) hours as needed for moderate pain.  . Insulin Pen Needle (PEN NEEDLES) 32G X 4 MM MISC BD Ultra-Fine Nano Pen Needle 32 gauge x 5/32"  USE AS DIRECTED DAILY  . Lancets (ONETOUCH ULTRASOFT) lancets OneTouch UltraSoft Lancets  USE TWICE DAILY  . Multiple Vitamin (MULTIVITAMIN WITH MINERALS) TABS tablet Take 1 tablet by mouth daily.  . Multiple Vitamins-Minerals (EYE VITAMINS & MINERALS) TABS Take 1 tablet by mouth daily.  . nortriptyline (PAMELOR) 25 MG capsule Take 25 mg by mouth at bedtime.  . Omega-3 Fatty Acids (FISH OIL) 1000 MG CAPS Take 1,000 mg by mouth daily at 12 noon.   Marland Kitchen omeprazole (PRILOSEC) 20 MG capsule   . ONE TOUCH ULTRA TEST test strip   . PRESCRIPTION MEDICATION Inject as directed once.  . simvastatin (ZOCOR)  20 MG tablet Take 20 mg by mouth daily.  . TRESIBA FLEXTOUCH 200 UNIT/ML SOPN 22 UNITS ONCE A DAY IN THE EVENING   Current Facility-Administered Medications (Other)  Medication Route  . Bevacizumab (AVASTIN) SOLN 1.25 mg Intravitreal  . Bevacizumab (AVASTIN) SOLN 1.25 mg Intravitreal      REVIEW OF SYSTEMS: ROS    Positive for: Endocrine, Eyes   Negative for: Constitutional, Gastrointestinal, Neurological, Skin, Genitourinary, Musculoskeletal, HENT, Cardiovascular, Respiratory, Psychiatric, Allergic/Imm, Heme/Lymph   Last edited by Doneen Poisson on 09/26/2018  1:50 PM. (History)       ALLERGIES No Known Allergies  PAST MEDICAL HISTORY Past  Medical History:  Diagnosis Date  . Basal cell carcinoma 11/23/2017   left elbow, right neck  . Diabetes mellitus without complication ([place])   . Hypertension   . Hypertensive retinopathy    OU  . Macular degeneration    OU  . SCC (squamous cell carcinoma) 11/23/2017   right forehead   Past Surgical History:  Procedure Laterality Date  . ABDOMINAL HYSTERECTOMY    . CATARACT EXTRACTION    . CHOLECYSTECTOMY    . EYE SURGERY    . SPINE SURGERY      FAMILY HISTORY History reviewed. No pertinent family history.  SOCIAL HISTORY Social History   Tobacco Use  . Smoking status: Former Research scientist (life sciences)  . Smokeless tobacco: Never Used  Substance Use Topics  . Alcohol use: No    Alcohol/week: 0.0 standard drinks  . Drug use: No         OPHTHALMIC EXAM:  Base Eye Exam    Visual Acuity (Snellen - Linear)      Right Left   Dist cc 20/30 -2 20/30 -2   Dist ph cc NI NI   Correction: Glasses       Tonometry (Tonopen, 1:55 PM)      Right Left   Pressure 16 13       Pupils      Dark Light Shape React APD   Right 4 3 Round Brisk 0   Left 4 3 Round Brisk 0       Visual Fields      Left Right    Full Full       Extraocular Movement      Right Left    Full Full       Neuro/Psych    Oriented x3: Yes   Mood/Affect: Normal       Dilation    Both eyes: 1.0% Mydriacyl, 2.5% Phenylephrine @ 1:55 PM        Slit Lamp and Fundus Exam    Slit Lamp Exam      Right Left   Lids/Lashes Dermatochalasis - upper lid, Telangiectasia, mild MGD Dermatochalasis - upper lid, Telangiectasia, Meibomian gland dysfunction   Conjunctiva/Sclera White and quiet White and quiet   Cornea Temporal Well healed cataract wounds, mild Arcus, trace endo pigment nasally, 1+PEE Arcus, Temporal Well healed cataract wounds, 1-2+ PEE   Anterior Chamber Deep and quiet Deep and quiet   Iris Round and moderately dilated to 4.87m Round and moderately dilated to 596m Iris atrophy from 0100 to 0230, peripheral  irodotimy at 0200   Lens 3-piece PC IOL in good position PC IOL in good position   Vitreous Vitreous syneresis, Posterior vitreous detachment, Weiss ring Vitreous syneresis, PVD       Fundus Exam      Right Left   Disc 360 Peripapillary atrophy, diffuse mild pallor, compact,  sharp rim mild pallor, Peripapillary atrophy and pigmentation   C/D Ratio 0.2 0.3   Macula Blunted foveal reflex, +PED, RPE mottling and clumping, Drusen, No heme or edema Blunted foveal reflex, +PED, RPE mottling and clumping, Drusen, No heme or edema, +CNVM with stable resolution of overlying SRF    Vessels Vascular attenuation Vascular attenuation   Periphery Attached, 360 Reticular degeneration Attached, 360 Reticular degeneration        Refraction    Wearing Rx      Sphere Cylinder Axis   Right -1.50 +0.75 180   Left -0.50 +0.75 180       Manifest Refraction      Sphere Cylinder Axis Dist VA   Right -1.25 +0.75 180 20/30-2   Left -0.25 +0.75 180 20/30-2          IMAGING AND PROCEDURES  Imaging and Procedures for _0 @  OCT, Retina - OU - Both Eyes       Right Eye Quality was good. Central Foveal Thickness: 236. Progression has been stable. Findings include normal foveal contour, no IRF, no SRF, pigment epithelial detachment, outer retinal atrophy, retinal drusen .   Left Eye Quality was good. Central Foveal Thickness: 261. Progression has been stable. Findings include no IRF, retinal drusen , pigment epithelial detachment, outer retinal atrophy, epiretinal membrane, abnormal foveal contour, no SRF (stable resolution of SRF; persistent low lying PVD).   Notes *Images captured and stored on drive  Diagnosis / Impression:  OD: Non-Exudative ARMD -- stable OS: Exudative ARMD -- stable resolution of SRF   Clinical management:  See below  Abbreviations: NFP - Normal foveal profile. CME - cystoid macular edema. PED - pigment epithelial detachment. IRF - intraretinal fluid. SRF - subretinal  fluid. EZ - ellipsoid zone. ERM - epiretinal membrane. ORA - outer retinal atrophy. ORT - outer retinal tubulation. SRHM - subretinal hyper-reflective material         Intravitreal Injection, Pharmacologic Agent - OS - Left Eye       Time Out 09/26/2018. 3:51 PM. Confirmed correct patient, procedure, site, and patient consented.   Anesthesia Topical anesthesia was used. Anesthetic medications included Lidocaine 2%, Proparacaine 0.5%.   Procedure Preparation included 5% betadine to ocular surface, eyelid speculum. A supplied needle was used.   Injection:  1.25 mg Bevacizumab (AVASTIN) SOLN   NDC: 29476-546-50, Lot: [id number] , Expiration date: 11/08/2018   Route: Intravitreal, Site: Left Eye, Waste: 0 mL  Post-op Post injection exam found visual acuity of at least counting fingers. The patient tolerated the procedure well. There were no complications. The patient received written and verbal post procedure care education.                 ASSESSMENT/PLAN:    ICD-10-CM   1. Exudative age-related macular degeneration of left eye with active choroidal neovascularization (HCC)  H35.3221 Intravitreal Injection, Pharmacologic Agent - OS - Left Eye    Bevacizumab (AVASTIN) SOLN 1.25 mg  2. Intermediate stage nonexudative age-related macular degeneration of right eye  H35.3112   3. Diabetes mellitus type 2 without retinopathy ([place])  E11.9   4. Hypertensive retinopathy of both eyes  H35.033   5. Essential hypertension  I10   6. Retinal edema  H35.81 OCT, Retina - OU - Both Eyes  7. Pseudophakia of both eyes  Z96.1   8. Intermediate stage nonexudative age-related macular degeneration of both eyes  H35.3132    1. Exudative age-related macular degeneration, left eye  - conversion of OS from  nonexudative to exudative ARMD prior to 02.05.20 visit  - s/p IVA OS #1 (02.05.20), #2 (03.04.20), #3 (04.30.20), #4 (06.02.20), #5 (07.14.20)  - OCT shows stable resolution of SRF overlying PED  with extension to ~7 wks  - BCVA improved to 20/30 OS  - recommend IVA #6 OS today 09.01.20 for maintenance treatment w/ extension to 8 wks  - pt wishes to be treated with IVA  - risks and benefits of intravitreal avastin discussed with patient  - informed consent obtained and signed  - see procedure note  - f/u in 8 wks for DFE, OCT, likely injection OS   2. Age related macular degeneration, non-exudative, right eye  - stable, former pt of Dr. Zigmund Daniel -- no significant change in OCT or exam  - The incidence, anatomy, and pathology of dry AMD, risk of progression, and the AREDS and AREDS 2 study including smoking risks discussed with patient.  -  recommend amsler grid monitoring  3. Diabetes mellitus, type 2 without retinopathy  - The incidence, risk factors for progression, natural history and treatment options for diabetic retinopathy  were discussed with patient.    - The need for close monitoring of blood glucose, blood pressure, and serum lipids, avoiding cigarette or any type of tobacco, and the need for long term follow up was also discussed with patient.  - f/u 1 year   4,5. Hypertensive retinopathy OU  - discussed importance of tight BP control  - monitor  6. No retinal edema on exam or OCT  7. Pseudophakia OU  - s/p CE/IOL OU  - beautiful surgeries, doing well  - monitor   Ophthalmic Meds Ordered this visit:  Meds ordered this encounter  Medications  . Bevacizumab (AVASTIN) SOLN 1.25 mg       Return in about 8 weeks (around 11/21/2018) for f/u exu ARMD OS, DFE, OCT.  There are no Patient Instructions on file for this visit.   Explained the diagnoses, plan, and follow up with the patient and they expressed understanding.  Patient expressed understanding of the importance of proper follow up care.   This document serves as a record of services personally performed by Gardiner Sleeper, MD, PhD. It was created on their behalf by Ernest Mallick, OA, an ophthalmic  assistant. The creation of this record is the provider's dictation and/or activities during the visit.    Electronically signed by: Ernest Mallick, OA  08.31.2020 9:20 PM    Gardiner Sleeper, M.D., Ph.D. Diseases & Surgery of the Retina and Vitreous [facility]  I have reviewed the above documentation for accuracy and completeness, and I agree with the above. Gardiner Sleeper, M.D., Ph.D. 09/26/18 9:21 PM    Abbreviations: M myopia (nearsighted); A astigmatism; H hyperopia (farsighted); P presbyopia; Mrx spectacle prescription;  CTL contact lenses; OD right eye; OS left eye; OU both eyes  XT exotropia; ET esotropia; PEK punctate epithelial keratitis; PEE punctate epithelial erosions; DES dry eye syndrome; MGD meibomian gland dysfunction; ATs artificial tears; PFAT's preservative free artificial tears; [place] nuclear sclerotic cataract; PSC posterior subcapsular cataract; ERM epi-retinal membrane; PVD posterior vitreous detachment; RD retinal detachment; DM diabetes mellitus; DR diabetic retinopathy; NPDR non-proliferative diabetic retinopathy; PDR proliferative diabetic retinopathy; CSME clinically significant macular edema; DME diabetic macular edema; dbh dot blot hemorrhages; CWS cotton wool spot; POAG primary open angle glaucoma; C/D cup-to-disc ratio; HVF humphrey visual field; GVF goldmann visual field; OCT optical coherence tomography; IOP intraocular pressure; BRVO Branch retinal  vein occlusion; CRVO central retinal vein occlusion; CRAO central retinal artery occlusion; BRAO branch retinal artery occlusion; RT retinal tear; SB scleral buckle; PPV pars plana vitrectomy; VH Vitreous hemorrhage; PRP panretinal laser photocoagulation; IVK intravitreal kenalog; VMT vitreomacular traction; MH Macular hole;  NVD neovascularization of the disc; NVE neovascularization elsewhere; AREDS age related eye disease study; ARMD age related macular degeneration; POAG primary open angle glaucoma;  EBMD epithelial/anterior basement membrane dystrophy; ACIOL anterior chamber intraocular lens; IOL intraocular lens; PCIOL posterior chamber intraocular lens; Phaco/IOL phacoemulsification with intraocular lens placement; [place] photorefractive keratectomy; LASIK laser assisted in situ keratomileusis; HTN hypertension; DM diabetes mellitus; COPD chronic obstructive pulmonary disease

## 2018-09-26 ENCOUNTER — Other Ambulatory Visit: Payer: Self-pay

## 2018-09-26 ENCOUNTER — Ambulatory Visit (INDEPENDENT_AMBULATORY_CARE_PROVIDER_SITE_OTHER): Payer: Medicare Other | Admitting: Ophthalmology

## 2018-09-26 ENCOUNTER — Encounter (INDEPENDENT_AMBULATORY_CARE_PROVIDER_SITE_OTHER): Payer: Self-pay | Admitting: Ophthalmology

## 2018-09-26 DIAGNOSIS — I10 Essential (primary) hypertension: Secondary | ICD-10-CM

## 2018-09-26 DIAGNOSIS — H353132 Nonexudative age-related macular degeneration, bilateral, intermediate dry stage: Secondary | ICD-10-CM

## 2018-09-26 DIAGNOSIS — H3581 Retinal edema: Secondary | ICD-10-CM | POA: Diagnosis not present

## 2018-09-26 DIAGNOSIS — E119 Type 2 diabetes mellitus without complications: Secondary | ICD-10-CM

## 2018-09-26 DIAGNOSIS — H353221 Exudative age-related macular degeneration, left eye, with active choroidal neovascularization: Secondary | ICD-10-CM

## 2018-09-26 DIAGNOSIS — H353112 Nonexudative age-related macular degeneration, right eye, intermediate dry stage: Secondary | ICD-10-CM

## 2018-09-26 DIAGNOSIS — H35033 Hypertensive retinopathy, bilateral: Secondary | ICD-10-CM | POA: Diagnosis not present

## 2018-09-26 DIAGNOSIS — Z961 Presence of intraocular lens: Secondary | ICD-10-CM

## 2018-09-26 MED ORDER — BEVACIZUMAB CHEMO INJECTION 1.25MG/0.05ML SYRINGE FOR KALEIDOSCOPE
1.2500 mg | INTRAVITREAL | Status: AC | PRN
  Administered 2018-09-26: 21:00:00 1.25 mg via INTRAVITREAL

## 2018-09-28 NOTE — Progress Notes (Signed)
Subjective: Sue [name] presents with diabetes, diabetic neuropathy and cc of painful, discolored, thick toenails and painful callus/corn which interfere with activities of daily living. Pain is aggravated when wearing enclosed shoe gear. Pain is relieved with periodic professional debridement.  Her daughter is present during the visit. Sue [name] is back to exercising, walking about 2 miles/day. She voices no new pedal problems on today's visit.  Merrilee Seashore, MD is her PCP.    Current Outpatient Medications:  .  amLODipine (NORVASC) 10 MG tablet, Take 10 mg by mouth daily. , Disp: , Rfl:  .  B-D ULTRAFINE III SHORT PEN 31G X 8 MM MISC, USE AS DIRECTED ONCE DAILY SUBCUTANEOUSLY, Disp: , Rfl: 11 .  calcium-vitamin D (OSCAL WITH D) 500-200 MG-UNIT tablet, Take 1 tablet by mouth daily at 12 noon., Disp: , Rfl:  .  Cobalamin Combinations (B-12) [phone] MCG SUBL, Take 1 tablet by mouth daily. , Disp: , Rfl:  .  enalapril (VASOTEC) 10 MG tablet, Take 10 mg by mouth 2 (two) times daily. , Disp: , Rfl:  .  enalapril (VASOTEC) 20 MG tablet, , Disp: , Rfl:  .  esomeprazole (NEXIUM) 20 MG capsule, Take 20 mg by mouth daily., Disp: , Rfl:  .  glipiZIDE (GLUCOTROL) 10 MG tablet, Take 10 mg by mouth 2 (two) times daily before a meal., Disp: , Rfl:  .  glucose blood test strip, OneTouch Ultra Test strips  TEST ONCE A DAY ONCE A DAY FINGERSTICK 90, Disp: , Rfl:  .  hydrochlorothiazide (HYDRODIURIL) 25 MG tablet, Take 25 mg by mouth every morning. , Disp: , Rfl:  .  HYDROcodone-acetaminophen (NORCO) 5-325 MG tablet, Take 1 tablet by mouth every 6 (six) hours as needed for moderate pain., Disp: 15 tablet, Rfl: 0 .  Insulin Pen Needle (PEN NEEDLES) 32G X 4 MM MISC, BD Ultra-Fine Nano Pen Needle 32 gauge x 5/32"  USE AS DIRECTED DAILY, Disp: , Rfl:  .  Lancets (ONETOUCH ULTRASOFT) lancets, OneTouch UltraSoft Lancets  USE TWICE DAILY, Disp: , Rfl:  .  Multiple Vitamin (MULTIVITAMIN WITH MINERALS) TABS  tablet, Take 1 tablet by mouth daily., Disp: , Rfl:  .  Multiple Vitamins-Minerals (EYE VITAMINS & MINERALS) TABS, Take 1 tablet by mouth daily., Disp: , Rfl:  .  nortriptyline (PAMELOR) 25 MG capsule, Take 25 mg by mouth at bedtime., Disp: , Rfl:  .  Omega-3 Fatty Acids (FISH OIL) 1000 MG CAPS, Take 1,000 mg by mouth daily at 12 noon. , Disp: , Rfl:  .  omeprazole (PRILOSEC) 20 MG capsule, , Disp: , Rfl:  .  ONE TOUCH ULTRA TEST test strip, , Disp: , Rfl:  .  Polyethyl Glycol-Propyl Glycol (SYSTANE) 0.4-0.3 % GEL ophthalmic gel, Place 1 application into both eyes 3 (three) times daily., Disp: , Rfl:  .  PRESCRIPTION MEDICATION, Inject as directed once., Disp: , Rfl:  .  simvastatin (ZOCOR) 20 MG tablet, Take 20 mg by mouth daily., Disp: , Rfl:  .  TRESIBA FLEXTOUCH 200 UNIT/ML SOPN, 22 UNITS ONCE A DAY IN THE EVENING, Disp: , Rfl: 6  Current Facility-Administered Medications:  .  Bevacizumab (AVASTIN) SOLN 1.25 mg, 1.25 mg, Intravitreal, , Bernarda Caffey, MD, 1.25 mg at 03/01/18 1344 .  Bevacizumab (AVASTIN) SOLN 1.25 mg, 1.25 mg, Intravitreal, , Bernarda Caffey, MD, 1.25 mg at 03/31/18 1449  No Known Allergies  Objective:  Vascular Examination: Capillary refill time less than 3 seconds x 10 digits.  Dorsalis pedis pulses palpable b/l.  Posterior tibial pulses palpable b/l.  Digital hair absent x 10 digits.  Skin temperature gradient WNL b/l.  Dermatological Examination: Skin with normal turgor, texture and tone b/l.  No open wounds b/l.  Toenails 1-5 b/l discolored, thick, dystrophic with subungual debris and pain with palpation to nailbeds due to thickness of nails.  Hyperkeratotic lesion(s) submet head 2 right foot. No erythema, no edema, no drainage, no flocculence noted.   Hyperkeratotic lesion distal tip right 2nd digit.  There is onycholysis of entire nailplate with no erythema, no edema, no drainage, no flocculence noted.  Musculoskeletal: Muscle strength 5/5 to all LE  muscle groups.  No pain, crepitus or joint limitation with passive/active ROM.  Neurological: Sensation diminished with 10 gram monofilament.  Vibratory sensation diminished  Assessment: 1. Painful onychomycosis toenails 1-5 b/l 2. Callus submet head 2 right foot 3. Corn distal tip right 2nd digit 4. NIDDM with Diabetic neuropathy  Plan: 1. Continue diabetic foot care principles. Literature dispensed on today. 2. Toenails 1-5 b/l were debrided in length and girth without iatrogenic bleeding. 3. Calluses pared submetatarsal head(s) 2 right foot utilizing sterile scalpel blade without incident.  4. Corn(s) right 2nd digit pared utilizing sterile scalpel blade without incident.  5. Patient to continue diabetic shoe gear daily. 6. Patient to report any pedal injuries to medical professional  7. Follow up 9 weeks. 8. Patient/POA to call should there be a concern in the interim.

## 2018-11-13 NOTE — Progress Notes (Signed)
[facility] Note  11/21/2018   CHIEF COMPLAINT Patient presents for Retina Follow Up  HISTORY OF PRESENT ILLNESS: Sue [name] is a 85 y.o. female who presents to the clinic today for:   HPI    Retina Follow Up    Patient presents with  Wet AMD.  In left eye.  Severity is moderate.  Duration of 8 weeks.  Since onset it is stable.  I, the attending physician,  performed the HPI with the patient and updated documentation appropriately.          Comments    Patient states vision the same OU. BS was 181 this am. Last a1c was 7.0, checked 2 months ago.        Last edited by Bernarda Caffey, MD on 11/21/2018  8:11 PM. (History)    pt states her vision is the same as last visit   Referring physician: Hortencia Pilar, MD [place],  [place] [address]  HISTORICAL INFORMATION:   Selected notes from the MEDICAL RECORD NUMBER Referred by Dr. Quentin Ore for concern of ARMD OU    CURRENT MEDICATIONS: Current Outpatient Medications (Ophthalmic Drugs)  Medication Sig  . Polyethyl Glycol-Propyl Glycol (SYSTANE) 0.4-0.3 % GEL ophthalmic gel Place 1 application into both eyes 3 (three) times daily.   No current facility-administered medications for this visit.  (Ophthalmic Drugs)   Current Outpatient Medications (Other)  Medication Sig  . amLODipine (NORVASC) 10 MG tablet Take 10 mg by mouth daily.   . B-D ULTRAFINE III SHORT PEN 31G X 8 MM MISC USE AS DIRECTED ONCE DAILY SUBCUTANEOUSLY  . calcium-vitamin D (OSCAL WITH D) 500-200 MG-UNIT tablet Take 1 tablet by mouth daily at 12 noon.  . Cobalamin Combinations (B-12) [phone] MCG SUBL Take 1 tablet by mouth daily.   . enalapril (VASOTEC) 10 MG tablet Take 10 mg by mouth 2 (two) times daily.   . enalapril (VASOTEC) 20 MG tablet   . esomeprazole (NEXIUM) 20 MG capsule Take 20 mg by mouth daily.  Marland Kitchen glipiZIDE (GLUCOTROL) 10 MG tablet Take 10 mg by mouth 2 (two) times daily before a  meal.  . glucose blood test strip OneTouch Ultra Test strips  TEST ONCE A DAY ONCE A DAY FINGERSTICK 90  . hydrochlorothiazide (HYDRODIURIL) 25 MG tablet Take 25 mg by mouth every morning.   Marland Kitchen HYDROcodone-acetaminophen (NORCO) 5-325 MG tablet Take 1 tablet by mouth every 6 (six) hours as needed for moderate pain.  . Insulin Pen Needle (PEN NEEDLES) 32G X 4 MM MISC BD Ultra-Fine Nano Pen Needle 32 gauge x 5/32"  USE AS DIRECTED DAILY  . Lancets (ONETOUCH ULTRASOFT) lancets OneTouch UltraSoft Lancets  USE TWICE DAILY  . Multiple Vitamin (MULTIVITAMIN WITH MINERALS) TABS tablet Take 1 tablet by mouth daily.  . Multiple Vitamins-Minerals (EYE VITAMINS & MINERALS) TABS Take 1 tablet by mouth daily.  . nortriptyline (PAMELOR) 25 MG capsule Take 25 mg by mouth at bedtime.  . Omega-3 Fatty Acids (FISH OIL) 1000 MG CAPS Take 1,000 mg by mouth daily at 12 noon.   Marland Kitchen omeprazole (PRILOSEC) 20 MG capsule   . ONE TOUCH ULTRA TEST test strip   . PRESCRIPTION MEDICATION Inject as directed once.  . simvastatin (ZOCOR) 20 MG tablet Take 20 mg by mouth daily.  . TRESIBA FLEXTOUCH 200 UNIT/ML SOPN 22 UNITS ONCE A DAY IN THE EVENING   Current Facility-Administered Medications (Other)  Medication Route  . Bevacizumab (  AVASTIN) SOLN 1.25 mg Intravitreal  . Bevacizumab (AVASTIN) SOLN 1.25 mg Intravitreal      REVIEW OF SYSTEMS: ROS    Positive for: Endocrine, Eyes   Negative for: Constitutional, Gastrointestinal, Neurological, Skin, Genitourinary, Musculoskeletal, HENT, Cardiovascular, Respiratory, Psychiatric, Allergic/Imm, Heme/Lymph   Last edited by Roselee Nova D, COT on 11/21/2018  2:16 PM. (History)       ALLERGIES No Known Allergies  PAST MEDICAL HISTORY Past Medical History:  Diagnosis Date  . Basal cell carcinoma 11/23/2017   left elbow, right neck  . Diabetes mellitus without complication ([place])   . Hypertension   . Hypertensive retinopathy    OU  . Macular degeneration    OU  .  SCC (squamous cell carcinoma) 11/23/2017   right forehead   Past Surgical History:  Procedure Laterality Date  . ABDOMINAL HYSTERECTOMY    . CATARACT EXTRACTION    . CHOLECYSTECTOMY    . EYE SURGERY    . SPINE SURGERY      FAMILY HISTORY History reviewed. No pertinent family history.  SOCIAL HISTORY Social History   Tobacco Use  . Smoking status: Former Research scientist (life sciences)  . Smokeless tobacco: Never Used  Substance Use Topics  . Alcohol use: No    Alcohol/week: 0.0 standard drinks  . Drug use: No         OPHTHALMIC EXAM:  Base Eye Exam    Visual Acuity (Snellen - Linear)      Right Left   Dist cc 20/40 +2 20/40 +1   Dist ph cc NI NI       Tonometry (Tonopen, 2:28 PM)      Right Left   Pressure 15 14       Pupils      Dark Light Shape React APD   Right 4 3 Round Brisk None   Left 4 3 Round Brisk None       Visual Fields (Counting fingers)      Left Right    Full Full       Extraocular Movement      Right Left    Full, Ortho Full, Ortho       Neuro/Psych    Oriented x3: Yes   Mood/Affect: Normal       Dilation    Both eyes: 1.0% Mydriacyl, 2.5% Phenylephrine @ 2:28 PM        Slit Lamp and Fundus Exam    Slit Lamp Exam      Right Left   Lids/Lashes Dermatochalasis - upper lid, Telangiectasia, mild MGD Dermatochalasis - upper lid, Telangiectasia, Meibomian gland dysfunction   Conjunctiva/Sclera White and quiet White and quiet   Cornea Temporal Well healed cataract wounds, mild Arcus, trace endo pigment nasally, 1+PEE Arcus, Temporal Well healed cataract wounds, 1-2+ PEE   Anterior Chamber Deep and quiet Deep and quiet   Iris Round and moderately dilated to 4.42mm Round and moderately dilated to 11mm, Iris atrophy from 0100 to 0230, peripheral irodotimy at 0200   Lens 3-piece PC IOL in good position PC IOL in good position   Vitreous Vitreous syneresis, Posterior vitreous detachment, Weiss ring Vitreous syneresis, PVD       Fundus Exam      Right Left    Disc 360 Peripapillary atrophy, diffuse mild pallor, compact, sharp rim mild pallor, Peripapillary atrophy and pigmentation   C/D Ratio 0.2 0.3   Macula Blunted foveal reflex, +PED, RPE mottling and clumping, Drusen, No heme or edema Blunted foveal reflex, +low lying  PED / +CNVM with stable resolution of overlying SRF, RPE mottling and clumping, Drusen, No heme or edema,    Vessels Vascular attenuation Vascular attenuation   Periphery Attached, 360 Reticular degeneration Attached, 360 Reticular degeneration        Refraction    Wearing Rx      Sphere Cylinder Axis   Right -1.50 +0.75 180   Left -0.50 +0.75 180       Manifest Refraction      Sphere Cylinder Axis Dist VA   Right -1.75 +0.75 180 20/30-2   Left -0.50 +0.75 180 20/30-2          IMAGING AND PROCEDURES  Imaging and Procedures for @TODAY @  OCT, Retina - OU - Both Eyes       Right Eye Quality was good. Central Foveal Thickness: 238. Progression has been stable. Findings include normal foveal contour, no IRF, no SRF, pigment epithelial detachment, outer retinal atrophy, retinal drusen .   Left Eye Quality was good. Central Foveal Thickness: 245. Progression has been stable. Findings include no IRF, retinal drusen , pigment epithelial detachment, outer retinal atrophy, epiretinal membrane, abnormal foveal contour, no SRF (stable resolution of SRF; persistent low lying PED).   Notes *Images captured and stored on drive  Diagnosis / Impression:  OD: Non-Exudative ARMD -- stable OS: Exudative ARMD -- stable resolution of SRF   Clinical management:  See below  Abbreviations: NFP - Normal foveal profile. CME - cystoid macular edema. PED - pigment epithelial detachment. IRF - intraretinal fluid. SRF - subretinal fluid. EZ - ellipsoid zone. ERM - epiretinal membrane. ORA - outer retinal atrophy. ORT - outer retinal tubulation. SRHM - subretinal hyper-reflective material         Intravitreal Injection,  Pharmacologic Agent - OS - Left Eye       Time Out 11/21/2018. 2:32 PM. Confirmed correct patient, procedure, site, and patient consented.   Anesthesia Topical anesthesia was used. Anesthetic medications included Lidocaine 2%, Proparacaine 0.5%.   Procedure Preparation included 5% betadine to ocular surface, eyelid speculum. A 30 gauge needle was used.   Injection:  1.25 mg Bevacizumab (AVASTIN) SOLN   NDC: [id number], [id number], Expiration date: 02/25/2019   Route: Intravitreal, Site: Left Eye, Waste: 0 mL  Post-op Post injection exam found visual acuity of at least counting fingers. The patient tolerated the procedure well. There were no complications. The patient received written and verbal post procedure care education.                 ASSESSMENT/PLAN:    ICD-10-CM   1. Exudative age-related macular degeneration of left eye with active choroidal neovascularization (HCC)  H35.3221 Intravitreal Injection, Pharmacologic Agent - OS - Left Eye    Bevacizumab (AVASTIN) SOLN 1.25 mg  2. Intermediate stage nonexudative age-related macular degeneration of right eye  H35.3112   3. Diabetes mellitus type 2 without retinopathy ([place])  E11.9   4. Hypertensive retinopathy of both eyes  H35.033   5. Essential hypertension  I10   6. Retinal edema  H35.81 OCT, Retina - OU - Both Eyes  7. Pseudophakia of both eyes  Z96.1    1. Exudative age-related macular degeneration, left eye  - conversion of OS from nonexudative to exudative ARMD prior to 02.05.20 visit  - s/p IVA OS #1 (02.05.20), #2 (03.04.20), #3 (04.30.20), #4 (06.02.20), #5 (07.14.20), #6 (9.1.20)  - OCT shows stable resolution of SRF overlying PED with extension to ~8 wks  - BCVA slightly decreased  to 20/40 from 20/30 OS  - recommend IVA #7 OS today 10.27.20 for maintenance treatment and staying at 8 wk interval  - pt wishes to be treated with IVA OS  - risks and benefits of intravitreal avastin discussed with  patient  - informed consent obtained and signed  - see procedure note  - f/u in 8 wks for DFE, OCT, likely injection OS   2. Age related macular degeneration, non-exudative, right eye  - stable, former pt of Dr. Zigmund Daniel -- no significant change in OCT or exam  - The incidence, anatomy, and pathology of dry AMD, risk of progression, and the AREDS and AREDS 2 study including smoking risks discussed with patient.  -  recommend amsler grid monitoring  3. Diabetes mellitus, type 2 without retinopathy  - The incidence, risk factors for progression, natural history and treatment options for diabetic retinopathy  were discussed with patient.    - The need for close monitoring of blood glucose, blood pressure, and serum lipids, avoiding cigarette or any type of tobacco, and the need for long term follow up was also discussed with patient.  - f/u 1 year   4,5. Hypertensive retinopathy OU  - discussed importance of tight BP control  - monitor  6. No retinal edema on exam or OCT  7. Pseudophakia OU  - s/p CE/IOL OU  - beautiful surgeries, doing well  - monitor  Ophthalmic Meds Ordered this visit:  Meds ordered this encounter  Medications  . Bevacizumab (AVASTIN) SOLN 1.25 mg   Return in about 8 weeks (around 01/16/2019) for f/u exu ARMD OS, DFE, OCT.  There are no Patient Instructions on file for this visit.  Explained the diagnoses, plan, and follow up with the patient and they expressed understanding.  Patient expressed understanding of the importance of proper follow up care.   Electronically signed by: Estill Bakes, COT 11/13/18 1:23 p.m.  Gardiner Sleeper, M.D., Ph.D. Diseases & Surgery of the Retina and [place] 11/21/18  I have reviewed the above documentation for accuracy and completeness, and I agree with the above. Gardiner Sleeper, M.D., Ph.D. 11/21/18 8:15 PM    Abbreviations: M myopia (nearsighted); A astigmatism; H hyperopia  (farsighted); P presbyopia; Mrx spectacle prescription;  CTL contact lenses; OD right eye; OS left eye; OU both eyes  XT exotropia; ET esotropia; PEK punctate epithelial keratitis; PEE punctate epithelial erosions; DES dry eye syndrome; MGD meibomian gland dysfunction; ATs artificial tears; PFAT's preservative free artificial tears; [place] nuclear sclerotic cataract; PSC posterior subcapsular cataract; ERM epi-retinal membrane; PVD posterior vitreous detachment; RD retinal detachment; DM diabetes mellitus; DR diabetic retinopathy; NPDR non-proliferative diabetic retinopathy; PDR proliferative diabetic retinopathy; CSME clinically significant macular edema; DME diabetic macular edema; dbh dot blot hemorrhages; CWS cotton wool spot; POAG primary open angle glaucoma; C/D cup-to-disc ratio; HVF humphrey visual field; GVF goldmann visual field; OCT optical coherence tomography; IOP intraocular pressure; BRVO Branch retinal vein occlusion; CRVO central retinal vein occlusion; CRAO central retinal artery occlusion; BRAO branch retinal artery occlusion; RT retinal tear; SB scleral buckle; PPV pars plana vitrectomy; VH Vitreous hemorrhage; PRP panretinal laser photocoagulation; IVK intravitreal kenalog; VMT vitreomacular traction; MH Macular hole;  NVD neovascularization of the disc; NVE neovascularization elsewhere; AREDS age related eye disease study; ARMD age related macular degeneration; POAG primary open angle glaucoma; EBMD epithelial/anterior basement membrane dystrophy; ACIOL anterior chamber intraocular lens; IOL intraocular lens; PCIOL posterior chamber intraocular lens; Phaco/IOL phacoemulsification with intraocular lens placement;  [place] photorefractive keratectomy; LASIK laser assisted in situ keratomileusis; HTN hypertension; DM diabetes mellitus; COPD chronic obstructive pulmonary disease

## 2018-11-21 ENCOUNTER — Other Ambulatory Visit: Payer: Self-pay

## 2018-11-21 ENCOUNTER — Ambulatory Visit (INDEPENDENT_AMBULATORY_CARE_PROVIDER_SITE_OTHER): Payer: Medicare Other | Admitting: Ophthalmology

## 2018-11-21 ENCOUNTER — Encounter (INDEPENDENT_AMBULATORY_CARE_PROVIDER_SITE_OTHER): Payer: Self-pay | Admitting: Ophthalmology

## 2018-11-21 DIAGNOSIS — E119 Type 2 diabetes mellitus without complications: Secondary | ICD-10-CM | POA: Diagnosis not present

## 2018-11-21 DIAGNOSIS — H353112 Nonexudative age-related macular degeneration, right eye, intermediate dry stage: Secondary | ICD-10-CM | POA: Diagnosis not present

## 2018-11-21 DIAGNOSIS — H353221 Exudative age-related macular degeneration, left eye, with active choroidal neovascularization: Secondary | ICD-10-CM | POA: Diagnosis not present

## 2018-11-21 DIAGNOSIS — H35033 Hypertensive retinopathy, bilateral: Secondary | ICD-10-CM | POA: Diagnosis not present

## 2018-11-21 DIAGNOSIS — I10 Essential (primary) hypertension: Secondary | ICD-10-CM

## 2018-11-21 DIAGNOSIS — H3581 Retinal edema: Secondary | ICD-10-CM

## 2018-11-21 DIAGNOSIS — Z961 Presence of intraocular lens: Secondary | ICD-10-CM

## 2018-11-21 MED ORDER — BEVACIZUMAB CHEMO INJECTION 1.25MG/0.05ML SYRINGE FOR KALEIDOSCOPE
1.2500 mg | INTRAVITREAL | Status: AC | PRN
  Administered 2018-11-21: 20:00:00 1.25 mg via INTRAVITREAL

## 2018-11-22 ENCOUNTER — Encounter: Payer: Self-pay | Admitting: Podiatry

## 2018-11-22 ENCOUNTER — Ambulatory Visit: Payer: Medicare Other | Admitting: Podiatry

## 2018-11-22 DIAGNOSIS — M79674 Pain in right toe(s): Secondary | ICD-10-CM | POA: Diagnosis not present

## 2018-11-22 DIAGNOSIS — M79675 Pain in left toe(s): Secondary | ICD-10-CM

## 2018-11-22 DIAGNOSIS — E1142 Type 2 diabetes mellitus with diabetic polyneuropathy: Secondary | ICD-10-CM | POA: Diagnosis not present

## 2018-11-22 DIAGNOSIS — B351 Tinea unguium: Secondary | ICD-10-CM

## 2018-11-22 DIAGNOSIS — L84 Corns and callosities: Secondary | ICD-10-CM | POA: Diagnosis not present

## 2018-11-22 NOTE — Patient Instructions (Signed)

## 2018-11-25 NOTE — Progress Notes (Signed)
Subjective: Sue [name] presents to clinic for preventative diabetic foot care.  She has h/o ulceration plantar aspect of right foot which has healed. She has diabetic shoes with offloading of callus. She voices no new pedal concerns on today's visit. She is able to walk for exercise with no problem in her diabetic shoes.   Her daughter is present during today's visit.   [name] voices no new pedal concerns on today's visit.  Current Outpatient Medications on File Prior to Visit  Medication Sig Dispense Refill  . amLODipine (NORVASC) 10 MG tablet Take 10 mg by mouth daily.     . B-D ULTRAFINE III SHORT PEN 31G X 8 MM MISC USE AS DIRECTED ONCE DAILY SUBCUTANEOUSLY  11  . calcium-vitamin D (OSCAL WITH D) 500-200 MG-UNIT tablet Take 1 tablet by mouth daily at 12 noon.    . Cobalamin Combinations (B-12) [phone] MCG SUBL Take 1 tablet by mouth daily.     . enalapril (VASOTEC) 10 MG tablet Take 10 mg by mouth 2 (two) times daily.     . enalapril (VASOTEC) 20 MG tablet     . esomeprazole (NEXIUM) 20 MG capsule Take 20 mg by mouth daily.    Marland Kitchen glipiZIDE (GLUCOTROL) 10 MG tablet Take 10 mg by mouth 2 (two) times daily before a meal.    . glucose blood test strip OneTouch Ultra Test strips  TEST ONCE A DAY ONCE A DAY FINGERSTICK 90    . hydrochlorothiazide (HYDRODIURIL) 25 MG tablet Take 25 mg by mouth every morning.     Marland Kitchen HYDROcodone-acetaminophen (NORCO) 5-325 MG tablet Take 1 tablet by mouth every 6 (six) hours as needed for moderate pain. 15 tablet 0  . Insulin Pen Needle (PEN NEEDLES) 32G X 4 MM MISC BD Ultra-Fine Nano Pen Needle 32 gauge x 5/32"  USE AS DIRECTED DAILY    . Lancets (ONETOUCH ULTRASOFT) lancets OneTouch UltraSoft Lancets  USE TWICE DAILY    . Multiple Vitamin (MULTIVITAMIN WITH MINERALS) TABS tablet Take 1 tablet by mouth daily.    . Multiple Vitamins-Minerals (EYE VITAMINS & MINERALS) TABS Take 1 tablet by mouth daily.    . nortriptyline (PAMELOR) 25 MG capsule Take 25  mg by mouth at bedtime.    . Omega-3 Fatty Acids (FISH OIL) 1000 MG CAPS Take 1,000 mg by mouth daily at 12 noon.     Marland Kitchen omeprazole (PRILOSEC) 20 MG capsule     . ONE TOUCH ULTRA TEST test strip     . Polyethyl Glycol-Propyl Glycol (SYSTANE) 0.4-0.3 % GEL ophthalmic gel Place 1 application into both eyes 3 (three) times daily.    Marland Kitchen PRESCRIPTION MEDICATION Inject as directed once.    . simvastatin (ZOCOR) 20 MG tablet Take 20 mg by mouth daily.    . TRESIBA FLEXTOUCH 200 UNIT/ML SOPN 22 UNITS ONCE A DAY IN THE EVENING  6   Current Facility-Administered Medications on File Prior to Visit  Medication Dose Route Frequency Provider Last Rate Last Dose  . Bevacizumab (AVASTIN) SOLN 1.25 mg  1.25 mg Intravitreal  Bernarda Caffey, MD   1.25 mg at 03/01/18 1344  . Bevacizumab (AVASTIN) SOLN 1.25 mg  1.25 mg Intravitreal  Bernarda Caffey, MD   1.25 mg at 03/31/18 1449     No Known Allergies   Objective: Physical Examination:  Vascular  Examination: Capillary refill time <3 seconds b/l.  Palpable DP/PT pulses b/l.  Digital hair absent b/l.  No edema noted b/l.  Skin temperature gradient  WNL b/l.  Dermatological Examination: Skin with normal turgor, texture and tone b/l.  No open wounds b/l.  No interdigital macerations noted b/l.  Elongated, thick, discolored brittle toenails with subungual debris and pain on dorsal palpation of nailbeds 1-5 left, 1, 3, 4, 5 right foot.  Hyperkeratotic lesion submet head 2 right foot and distal tip/dorsal nailbed of right 2nd digit. No edema, no erythema, no drainage, no flocculence.   Musculoskeletal Examination: Muscle strength 5/5 to all muscle groups b/l.  Rigid hammertoe deformity right 2nd digit.  No pain, crepitus or joint discomfort with active/passive ROM.  Neurological Examination: Sensation diminished b/l with 10 gram monofilament.  Assessment: 1. Mycotic nail infection with pain 1-5 left, 1, 3, 4, 5 right foot 2. Corn dorsal  nailbed/distal tip right 2nd digit 3. Callus submet head 2 right foot 4. NIDDM with neuropathy  Plan: 1. Toenails  1-5 left, 1, 3, 4, 5 right foot were debrided in length and girth without iatrogenic laceration. 2. Calluses pared submetatarsal head 2 right foot utilizing sterile scalpel blade without incident. Corn dorsal nailbed/distal tip right 2nd digit pared utilizing sterile scalpel blade without incident. 3. Continue offloaded diabetic shoes daily. 4. Report any pedal injuries to medical professional. 5. Follow up 9 weeks. 6. Patient/POA to call should there be a question/concern in there interim.

## 2018-12-11 ENCOUNTER — Encounter (HOSPITAL_COMMUNITY): Payer: Self-pay

## 2018-12-11 ENCOUNTER — Emergency Department (HOSPITAL_COMMUNITY): Payer: Medicare Other

## 2018-12-11 ENCOUNTER — Other Ambulatory Visit: Payer: Self-pay

## 2018-12-11 ENCOUNTER — Emergency Department (HOSPITAL_COMMUNITY)
Admission: EM | Admit: 2018-12-11 | Discharge: 2018-12-11 | Disposition: A | Discharge status: Home / Self Care | Payer: Medicare Other | Attending: Emergency Medicine | Admitting: Emergency Medicine

## 2018-12-11 DIAGNOSIS — Z87891 Personal history of nicotine dependence: Secondary | ICD-10-CM | POA: Diagnosis not present

## 2018-12-11 DIAGNOSIS — R197 Diarrhea, unspecified: Secondary | ICD-10-CM

## 2018-12-11 DIAGNOSIS — I10 Essential (primary) hypertension: Secondary | ICD-10-CM | POA: Diagnosis not present

## 2018-12-11 DIAGNOSIS — U071 COVID-19: Secondary | ICD-10-CM | POA: Diagnosis not present

## 2018-12-11 DIAGNOSIS — Z794 Long term (current) use of insulin: Secondary | ICD-10-CM | POA: Diagnosis not present

## 2018-12-11 DIAGNOSIS — R05 Cough: Secondary | ICD-10-CM | POA: Diagnosis not present

## 2018-12-11 DIAGNOSIS — Z79899 Other long term (current) drug therapy: Secondary | ICD-10-CM | POA: Diagnosis not present

## 2018-12-11 DIAGNOSIS — E119 Type 2 diabetes mellitus without complications: Secondary | ICD-10-CM | POA: Diagnosis not present

## 2018-12-11 LAB — CBC
HCT: 34.2 % — ABNORMAL LOW (ref 36.0–46.0)
Hemoglobin: 10.9 g/dL — ABNORMAL LOW (ref 12.0–15.0)
MCH: 28.6 pg (ref 26.0–34.0)
MCHC: 31.9 g/dL (ref 30.0–36.0)
MCV: 89.8 fL (ref 80.0–100.0)
Platelets: 219 10*3/uL (ref 150–400)
RBC: 3.81 MIL/uL — ABNORMAL LOW (ref 3.87–5.11)
RDW: 14 % (ref 11.5–15.5)
WBC: 5.2 10*3/uL (ref 4.0–10.5)
nRBC: 0 % (ref 0.0–0.2)

## 2018-12-11 LAB — COMPREHENSIVE METABOLIC PANEL
ALT: 16 U/L (ref 0–44)
AST: 28 U/L (ref 15–41)
Albumin: 3.4 g/dL — ABNORMAL LOW (ref 3.5–5.0)
Alkaline Phosphatase: 93 U/L (ref 38–126)
Anion gap: 10 (ref 5–15)
BUN: 20 mg/dL (ref 8–23)
CO2: 25 mmol/L (ref 22–32)
Calcium: 8.3 mg/dL — ABNORMAL LOW (ref 8.9–10.3)
Chloride: 100 mmol/L (ref 98–111)
Creatinine, Ser: 1.35 mg/dL — ABNORMAL HIGH (ref 0.44–1.00)
GFR calc Af Amer: 42 mL/min — ABNORMAL LOW (ref >60)
GFR calc non Af Amer: 36 mL/min — ABNORMAL LOW (ref >60)
Glucose, Bld: 137 mg/dL — ABNORMAL HIGH (ref 70–99)
Potassium: 4.1 mmol/L (ref 3.5–5.1)
Sodium: 135 mmol/L (ref 135–145)
Total Bilirubin: 0.7 mg/dL (ref 0.3–1.2)
Total Protein: 6.5 g/dL (ref 6.5–8.1)

## 2018-12-11 MED ORDER — SODIUM CHLORIDE 0.9 % IV BOLUS (SEPSIS)
500.0000 mL | Freq: Once | INTRAVENOUS | Status: AC
  Administered 2018-12-11: 500 mL via INTRAVENOUS

## 2018-12-11 MED ORDER — SODIUM CHLORIDE 0.9 % IV SOLN: 1000.0000 mL | INTRAVENOUS | Status: DC

## 2018-12-11 NOTE — ED Notes (Signed)
Daughter was just informed by Pt brother, he tested positive for Covid and has had recent contact with Pt.

## 2018-12-11 NOTE — ED Provider Notes (Addendum)
[facility] DEPT Provider Note   CSN: [id number] Arrival date & time: 12/11/18  1225     History   Chief Complaint Chief Complaint  Patient presents with  . Diarrhea  . Cough    HPI Sue [name] is a 85 y.o. female.     HPI Pt states she started to have diarrhea last Monday.  4 times per day.  Watery in nature.  She has had some nausea as well.  No vomiting.  No abdominal pain.  She took some otc meds and pedialyte and seems to be a bit better although she still feels a little weak.  No known covid exposure.  Pt denies any significant cough, maybe a little.  No fevers.  No known covid exposure.     Past Medical History:  Diagnosis Date  . Basal cell carcinoma 11/23/2017   left elbow, right neck  . Diabetes mellitus without complication ([place])   . Hypertension   . Hypertensive retinopathy    OU  . Macular degeneration    OU  . SCC (squamous cell carcinoma) 11/23/2017   right forehead    Patient Active Problem List   Diagnosis Date Noted  . Diabetic ulcer of foot associated with diabetes mellitus due to underlying condition, limited to breakdown of skin ([place]) 01/11/2018  . Right hip pain 04/15/2016  . Right buttock pain 04/15/2016  . Primary osteoarthritis of right hip 04/15/2016    Past Surgical History:  Procedure Laterality Date  . ABDOMINAL HYSTERECTOMY    . CATARACT EXTRACTION    . CHOLECYSTECTOMY    . EYE SURGERY    . SPINE SURGERY       OB History   No obstetric history on file.      Home Medications    Prior to Admission medications   Medication Sig Start Date End Date Taking? Authorizing Provider  amLODipine (NORVASC) 10 MG tablet Take 10 mg by mouth daily.  11/12/17   [provider]  B-D ULTRAFINE III SHORT PEN 31G X 8 MM MISC USE AS DIRECTED ONCE DAILY SUBCUTANEOUSLY 09/14/17   [provider]  calcium-vitamin D (OSCAL WITH D) 500-200 MG-UNIT tablet Take 1 tablet by mouth daily at 12 noon.     [provider]  Cobalamin Combinations (B-12) [phone] MCG SUBL Take 1 tablet by mouth daily.     [provider]  enalapril (VASOTEC) 10 MG tablet Take 10 mg by mouth 2 (two) times daily.     [provider]  enalapril (VASOTEC) 20 MG tablet  04/11/18   [provider]  esomeprazole (NEXIUM) 20 MG capsule Take 20 mg by mouth daily.    [provider]  glipiZIDE (GLUCOTROL) 10 MG tablet Take 10 mg by mouth 2 (two) times daily before a meal.    [provider]  glucose blood test strip OneTouch Ultra Test strips  TEST ONCE A DAY ONCE A DAY FINGERSTICK 90    [provider]  hydrochlorothiazide (HYDRODIURIL) 25 MG tablet Take 25 mg by mouth every morning.  11/12/17   [provider]  HYDROcodone-acetaminophen (NORCO) 5-325 MG tablet Take 1 tablet by mouth every 6 (six) hours as needed for moderate pain. 04/15/16   Horald Pollen, MD  Insulin Pen Needle (PEN NEEDLES) 32G X 4 MM MISC BD Ultra-Fine Nano Pen Needle 32 gauge x 5/32"  USE AS DIRECTED DAILY    [provider]  Lancets (ONETOUCH ULTRASOFT) lancets OneTouch UltraSoft Lancets  USE TWICE DAILY    [provider]  Multiple Vitamin (MULTIVITAMIN WITH MINERALS) TABS tablet Take 1 tablet by mouth daily.    [provider]  Multiple Vitamins-Minerals (EYE VITAMINS & MINERALS) TABS Take 1 tablet by mouth daily.    [provider]  nortriptyline (PAMELOR) 25 MG capsule Take 25 mg by mouth at bedtime.    [provider]  Omega-3 Fatty Acids (FISH OIL) 1000 MG CAPS Take 1,000 mg by mouth daily at 12 noon.     [provider]  omeprazole (PRILOSEC) 20 MG capsule  12/08/17   [provider]  ONE TOUCH ULTRA TEST test strip  12/07/17   [provider]  Polyethyl Glycol-Propyl Glycol (SYSTANE) 0.4-0.3 % GEL ophthalmic gel Place 1 application into both eyes 3 (three) times daily.    [provider]   PRESCRIPTION MEDICATION Inject as directed once.    [provider]  simvastatin (ZOCOR) 20 MG tablet Take 20 mg by mouth daily.    [provider]  TRESIBA FLEXTOUCH 200 UNIT/ML SOPN 22 UNITS ONCE A DAY IN THE EVENING 12/01/17   [provider]    Family History History reviewed. No pertinent family history.  Social History Social History   Tobacco Use  . Smoking status: Former Research scientist (life sciences)  . Smokeless tobacco: Never Used  Substance Use Topics  . Alcohol use: No    Alcohol/week: 0.0 standard drinks  . Drug use: No     Allergies   Patient has no known allergies.   Review of Systems Review of Systems  All other systems reviewed and are negative.    Physical Exam Updated Vital Signs BP (!) 179/95   Pulse 91   Temp 98.6 F (37 C) (Oral)   Resp 16   SpO2 97%   Physical Exam Vitals signs and nursing note reviewed.  Constitutional:      General: She is not in acute distress.    Appearance: She is well-developed.  HENT:     Head: Normocephalic and atraumatic.     Right Ear: External ear normal.     Left Ear: External ear normal.  Eyes:     General: No scleral icterus.       Right eye: No discharge.        Left eye: No discharge.     Conjunctiva/sclera: Conjunctivae normal.  Neck:     Musculoskeletal: Neck supple.     Trachea: No tracheal deviation.  Cardiovascular:     Rate and Rhythm: Normal rate and regular rhythm.  Pulmonary:     Effort: Pulmonary effort is normal. No respiratory distress.     Breath sounds: Normal breath sounds. No stridor. No wheezing or rales.  Abdominal:     General: Bowel sounds are normal. There is no distension.     Palpations: Abdomen is soft.     Tenderness: There is no abdominal tenderness. There is no guarding or rebound.  Musculoskeletal:        General: No tenderness.  Skin:    General: Skin is warm and dry.     Findings: No rash.  Neurological:     Mental Status: She is alert.     Cranial  Nerves: No cranial nerve deficit (no facial droop, extraocular movements intact, no slurred speech).     Sensory: No sensory deficit.     Motor: No abnormal muscle tone or seizure activity.     Coordination: Coordination normal.      ED  Treatments / Results  Labs (all labs ordered are listed, but only abnormal results are displayed) Labs Reviewed  CBC - Abnormal; Notable for the following components:      Result Value   RBC 3.81 (*)    Hemoglobin 10.9 (*)    HCT 34.2 (*)    All other components within normal limits  COMPREHENSIVE METABOLIC PANEL - Abnormal; Notable for the following components:   Glucose, Bld 137 (*)    Creatinine, Ser 1.35 (*)    Calcium 8.3 (*)    Albumin 3.4 (*)    GFR calc non Af Amer 36 (*)    GFR calc Af Amer 42 (*)    All other components within normal limits  GASTROINTESTINAL PANEL BY PCR, STOOL (REPLACES STOOL CULTURE)  SARS CORONAVIRUS 2 (TAT 6-24 HRS)    EKG None  Radiology Dg Chest Portable 1 View  Result Date: 12/11/2018 CLINICAL DATA:  Cough EXAM: PORTABLE CHEST 1 VIEW COMPARISON:  Radiograph May 29, 2009 FINDINGS: Lung volumes are low with hazy interstitial and more patchy airspace opacity in the lung bases, greater than expected for atelectatic change. No pneumothorax or effusion. The aorta is calcified. The remaining cardiomediastinal contours are unremarkable. Degenerative changes are present in the imaged spine and shoulders. IMPRESSION: Low lung volumes with basilar atelectasis. More patchy opacity could reflect a developing infection or early edema. Electronically Signed   By: Lovena Le M.D.   On: 12/11/2018 15:36    Procedures Procedures (including critical care time)  Medications Ordered in ED Medications  sodium chloride 0.9 % bolus 500 mL (0 mLs Intravenous Stopped 12/11/18 1655)    Followed by  0.9 %  sodium chloride infusion (has no administration in time range)     Initial Impression / Assessment and Plan / ED Course   I have reviewed the triage vital signs and the nursing notes.  Pertinent labs & imaging results that were available during my care of the patient were reviewed by me and considered in my medical decision making (see chart for details).  Clinical Course as of Dec 10 1712  Mon Dec 11, 2018  1601 Patient appears hemodynamically stable.  No hypoxia.  Chest x-ray with atelectasis versus pneumonia   [JK]  1704 No fever.  Electrolyte panel unremarkable.  Creatinine increased but previous value was many years ago   [JK]  1704 Hemoglobin is similar to previous values.   [JK]    Clinical Course User Index [JK] Dorie Rank, MD     Patient presented to the ED for evaluation of cough and diarrhea.  No definite pneumonia on x-ray.  Patient is afebrile.  Vital signs reassuring.  Laboratory tests are reassuring.  Patient has not had any episodes of diarrhea today.  She is appears stable for continued outpatient management.  Final Clinical Impressions(s) / ED Diagnoses   Final diagnoses:  Diarrhea, unspecified type    ED Discharge Orders    None       Dorie Rank, MD 12/11/18 1714    Dorie Rank, MD 12/11/18 1714

## 2018-12-11 NOTE — ED Triage Notes (Signed)
Pt states having diarrhea, scratchy throat and non productive cough x 1 week. Denies N/V fever SOB.

## 2018-12-11 NOTE — Discharge Instructions (Signed)
Continue the current medications, follow-up with your primary care doctor, return as needed for worsening symptoms

## 2018-12-12 LAB — SARS CORONAVIRUS 2 (TAT 6-24 HRS): SARS Coronavirus 2: POSITIVE — AB

## 2018-12-17 ENCOUNTER — Encounter (HOSPITAL_COMMUNITY): Payer: Self-pay | Admitting: Emergency Medicine

## 2018-12-17 ENCOUNTER — Inpatient Hospital Stay (HOSPITAL_COMMUNITY)
Admission: EM | Admit: 2018-12-17 | Discharge: 2018-12-23 | DRG: 177 | Disposition: A | Discharge status: Home Health | Payer: Medicare Other | Attending: Internal Medicine | Admitting: Internal Medicine

## 2018-12-17 ENCOUNTER — Other Ambulatory Visit: Payer: Self-pay

## 2018-12-17 ENCOUNTER — Emergency Department (HOSPITAL_COMMUNITY): Payer: Medicare Other

## 2018-12-17 DIAGNOSIS — E1165 Type 2 diabetes mellitus with hyperglycemia: Secondary | ICD-10-CM | POA: Diagnosis present

## 2018-12-17 DIAGNOSIS — A0839 Other viral enteritis: Secondary | ICD-10-CM | POA: Diagnosis present

## 2018-12-17 DIAGNOSIS — U071 COVID-19: Secondary | ICD-10-CM | POA: Diagnosis not present

## 2018-12-17 DIAGNOSIS — Z6829 Body mass index (BMI) 29.0-29.9, adult: Secondary | ICD-10-CM | POA: Diagnosis not present

## 2018-12-17 DIAGNOSIS — F419 Anxiety disorder, unspecified: Secondary | ICD-10-CM | POA: Diagnosis present

## 2018-12-17 DIAGNOSIS — E785 Hyperlipidemia, unspecified: Secondary | ICD-10-CM | POA: Diagnosis present

## 2018-12-17 DIAGNOSIS — E109 Type 1 diabetes mellitus without complications: Secondary | ICD-10-CM | POA: Insufficient documentation

## 2018-12-17 DIAGNOSIS — Z743 Need for continuous supervision: Secondary | ICD-10-CM | POA: Diagnosis not present

## 2018-12-17 DIAGNOSIS — N1832 Chronic kidney disease, stage 3b: Secondary | ICD-10-CM | POA: Diagnosis not present

## 2018-12-17 DIAGNOSIS — Z794 Long term (current) use of insulin: Secondary | ICD-10-CM

## 2018-12-17 DIAGNOSIS — Z90710 Acquired absence of both cervix and uterus: Secondary | ICD-10-CM | POA: Diagnosis not present

## 2018-12-17 DIAGNOSIS — E871 Hypo-osmolality and hyponatremia: Secondary | ICD-10-CM | POA: Diagnosis present

## 2018-12-17 DIAGNOSIS — R0602 Shortness of breath: Secondary | ICD-10-CM | POA: Diagnosis not present

## 2018-12-17 DIAGNOSIS — D649 Anemia, unspecified: Secondary | ICD-10-CM | POA: Diagnosis not present

## 2018-12-17 DIAGNOSIS — J9601 Acute respiratory failure with hypoxia: Secondary | ICD-10-CM | POA: Diagnosis present

## 2018-12-17 DIAGNOSIS — Z66 Do not resuscitate: Secondary | ICD-10-CM | POA: Diagnosis not present

## 2018-12-17 DIAGNOSIS — K219 Gastro-esophageal reflux disease without esophagitis: Secondary | ICD-10-CM | POA: Diagnosis not present

## 2018-12-17 DIAGNOSIS — J1289 Other viral pneumonia: Secondary | ICD-10-CM | POA: Diagnosis not present

## 2018-12-17 DIAGNOSIS — M2550 Pain in unspecified joint: Secondary | ICD-10-CM | POA: Diagnosis not present

## 2018-12-17 DIAGNOSIS — E7800 Pure hypercholesterolemia, unspecified: Secondary | ICD-10-CM | POA: Diagnosis not present

## 2018-12-17 DIAGNOSIS — E860 Dehydration: Secondary | ICD-10-CM | POA: Diagnosis not present

## 2018-12-17 DIAGNOSIS — I129 Hypertensive chronic kidney disease with stage 1 through stage 4 chronic kidney disease, or unspecified chronic kidney disease: Secondary | ICD-10-CM | POA: Diagnosis not present

## 2018-12-17 DIAGNOSIS — Z85828 Personal history of other malignant neoplasm of skin: Secondary | ICD-10-CM

## 2018-12-17 DIAGNOSIS — E663 Overweight: Secondary | ICD-10-CM | POA: Diagnosis present

## 2018-12-17 DIAGNOSIS — Z87891 Personal history of nicotine dependence: Secondary | ICD-10-CM

## 2018-12-17 DIAGNOSIS — R7989 Other specified abnormal findings of blood chemistry: Secondary | ICD-10-CM

## 2018-12-17 DIAGNOSIS — Z79899 Other long term (current) drug therapy: Secondary | ICD-10-CM | POA: Diagnosis not present

## 2018-12-17 DIAGNOSIS — N179 Acute kidney failure, unspecified: Secondary | ICD-10-CM | POA: Diagnosis present

## 2018-12-17 DIAGNOSIS — I10 Essential (primary) hypertension: Secondary | ICD-10-CM | POA: Diagnosis present

## 2018-12-17 DIAGNOSIS — F329 Major depressive disorder, single episode, unspecified: Secondary | ICD-10-CM | POA: Diagnosis present

## 2018-12-17 DIAGNOSIS — IMO0002 Reserved for concepts with insufficient information to code with codable children: Secondary | ICD-10-CM | POA: Diagnosis present

## 2018-12-17 DIAGNOSIS — E1122 Type 2 diabetes mellitus with diabetic chronic kidney disease: Secondary | ICD-10-CM | POA: Diagnosis present

## 2018-12-17 DIAGNOSIS — R0902 Hypoxemia: Secondary | ICD-10-CM | POA: Diagnosis not present

## 2018-12-17 DIAGNOSIS — J069 Acute upper respiratory infection, unspecified: Secondary | ICD-10-CM | POA: Diagnosis not present

## 2018-12-17 DIAGNOSIS — E118 Type 2 diabetes mellitus with unspecified complications: Secondary | ICD-10-CM | POA: Diagnosis not present

## 2018-12-17 DIAGNOSIS — R5381 Other malaise: Secondary | ICD-10-CM | POA: Diagnosis not present

## 2018-12-17 DIAGNOSIS — F320 Major depressive disorder, single episode, mild: Secondary | ICD-10-CM | POA: Diagnosis not present

## 2018-12-17 DIAGNOSIS — T380X5A Adverse effect of glucocorticoids and synthetic analogues, initial encounter: Secondary | ICD-10-CM | POA: Diagnosis present

## 2018-12-17 DIAGNOSIS — K5289 Other specified noninfective gastroenteritis and colitis: Secondary | ICD-10-CM | POA: Diagnosis not present

## 2018-12-17 DIAGNOSIS — D638 Anemia in other chronic diseases classified elsewhere: Secondary | ICD-10-CM | POA: Diagnosis present

## 2018-12-17 DIAGNOSIS — Z7401 Bed confinement status: Secondary | ICD-10-CM | POA: Diagnosis not present

## 2018-12-17 DIAGNOSIS — R197 Diarrhea, unspecified: Secondary | ICD-10-CM | POA: Diagnosis not present

## 2018-12-17 DIAGNOSIS — F32A Depression, unspecified: Secondary | ICD-10-CM | POA: Diagnosis present

## 2018-12-17 LAB — CBC WITH DIFFERENTIAL/PLATELET
Abs Immature Granulocytes: 0.15 10*3/uL — ABNORMAL HIGH (ref 0.00–0.07)
Basophils Absolute: 0 10*3/uL (ref 0.0–0.1)
Basophils Relative: 0 %
Eosinophils Absolute: 0 10*3/uL (ref 0.0–0.5)
Eosinophils Relative: 0 %
HCT: 34.5 % — ABNORMAL LOW (ref 36.0–46.0)
Hemoglobin: 11.3 g/dL — ABNORMAL LOW (ref 12.0–15.0)
Immature Granulocytes: 2 %
Lymphocytes Relative: 8 %
Lymphs Abs: 0.6 10*3/uL — ABNORMAL LOW (ref 0.7–4.0)
MCH: 27.6 pg (ref 26.0–34.0)
MCHC: 32.8 g/dL (ref 30.0–36.0)
MCV: 84.4 fL (ref 80.0–100.0)
Monocytes Absolute: 0.5 10*3/uL (ref 0.1–1.0)
Monocytes Relative: 7 %
Neutro Abs: 5.9 10*3/uL (ref 1.7–7.7)
Neutrophils Relative %: 83 %
Platelets: 358 10*3/uL (ref 150–400)
RBC: 4.09 MIL/uL (ref 3.87–5.11)
RDW: 13.5 % (ref 11.5–15.5)
WBC: 7.2 10*3/uL (ref 4.0–10.5)
nRBC: 0 % (ref 0.0–0.2)

## 2018-12-17 LAB — COMPREHENSIVE METABOLIC PANEL
ALT: 26 U/L (ref 0–44)
AST: 55 U/L — ABNORMAL HIGH (ref 15–41)
Albumin: 3.2 g/dL — ABNORMAL LOW (ref 3.5–5.0)
Alkaline Phosphatase: 107 U/L (ref 38–126)
Anion gap: 15 (ref 5–15)
BUN: 10 mg/dL (ref 8–23)
CO2: 20 mmol/L — ABNORMAL LOW (ref 22–32)
Calcium: 8.2 mg/dL — ABNORMAL LOW (ref 8.9–10.3)
Chloride: 89 mmol/L — ABNORMAL LOW (ref 98–111)
Creatinine, Ser: 1.23 mg/dL — ABNORMAL HIGH (ref 0.44–1.00)
GFR calc Af Amer: 47 mL/min — ABNORMAL LOW (ref >60)
GFR calc non Af Amer: 40 mL/min — ABNORMAL LOW (ref >60)
Glucose, Bld: 77 mg/dL (ref 70–99)
Potassium: 4.6 mmol/L (ref 3.5–5.1)
Sodium: 124 mmol/L — ABNORMAL LOW (ref 135–145)
Total Bilirubin: 0.2 mg/dL — ABNORMAL LOW (ref 0.3–1.2)
Total Protein: 6.8 g/dL (ref 6.5–8.1)

## 2018-12-17 LAB — CBG MONITORING, ED: Glucose-Capillary: 267 mg/dL — ABNORMAL HIGH (ref 70–99)

## 2018-12-17 LAB — FIBRINOGEN: Fibrinogen: 660 mg/dL — ABNORMAL HIGH (ref 210–475)

## 2018-12-17 LAB — FERRITIN: Ferritin: 227 ng/mL (ref 11–307)

## 2018-12-17 LAB — PROCALCITONIN: Procalcitonin: 0.19 ng/mL

## 2018-12-17 LAB — D-DIMER, QUANTITATIVE: D-Dimer, Quant: 1.04 ug/mL-FEU — ABNORMAL HIGH (ref 0.00–0.50)

## 2018-12-17 LAB — C-REACTIVE PROTEIN: CRP: 7.5 mg/dL — ABNORMAL HIGH (ref <1.0)

## 2018-12-17 LAB — HEMOGLOBIN A1C
Hgb A1c MFr Bld: 9.1 % — ABNORMAL HIGH (ref 4.8–5.6)
Mean Plasma Glucose: 214.47 mg/dL

## 2018-12-17 LAB — TRIGLYCERIDES: Triglycerides: 88 mg/dL (ref <150)

## 2018-12-17 LAB — LACTIC ACID, PLASMA: Lactic Acid, Venous: 1.5 mmol/L (ref 0.5–1.9)

## 2018-12-17 LAB — LACTATE DEHYDROGENASE: LDH: 392 U/L — ABNORMAL HIGH (ref 98–192)

## 2018-12-17 MED ORDER — INSULIN GLARGINE 100 UNIT/ML ~~LOC~~ SOLN
22.0000 [IU] | Freq: Every day | SUBCUTANEOUS | Status: DC
  Administered 2018-12-18: 22 [IU] via SUBCUTANEOUS
  Filled 2018-12-17 (×2): qty 0.22

## 2018-12-17 MED ORDER — INSULIN ASPART 100 UNIT/ML ~~LOC~~ SOLN
0.0000 [IU] | Freq: Every day | SUBCUTANEOUS | Status: DC
  Administered 2018-12-18: 2 [IU] via SUBCUTANEOUS
  Administered 2018-12-18: 3 [IU] via SUBCUTANEOUS
  Administered 2018-12-19: 2 [IU] via SUBCUTANEOUS
  Administered 2018-12-20: 5 [IU] via SUBCUTANEOUS
  Administered 2018-12-21 – 2018-12-22 (×2): 3 [IU] via SUBCUTANEOUS

## 2018-12-17 MED ORDER — HYDROCODONE-ACETAMINOPHEN 5-325 MG PO TABS: 1.0000 | ORAL_TABLET | Freq: Four times a day (QID) | ORAL | Status: DC | PRN

## 2018-12-17 MED ORDER — OXYCODONE HCL 5 MG PO TABS: 5.0000 mg | ORAL_TABLET | ORAL | Status: DC | PRN

## 2018-12-17 MED ORDER — DIPHENOXYLATE-ATROPINE 2.5-0.025 MG PO TABS: 1.0000 | ORAL_TABLET | Freq: Three times a day (TID) | ORAL | Status: DC | PRN

## 2018-12-17 MED ORDER — SODIUM CHLORIDE 0.9% FLUSH: 3.0000 mL | INTRAVENOUS | Status: DC | PRN

## 2018-12-17 MED ORDER — SODIUM CHLORIDE 0.9 % IV SOLN: 250.0000 mL | INTRAVENOUS | Status: DC | PRN

## 2018-12-17 MED ORDER — SODIUM CHLORIDE 0.9 % IV SOLN
500.0000 mg | INTRAVENOUS | Status: DC
  Administered 2018-12-17 – 2018-12-18 (×2): 500 mg via INTRAVENOUS
  Filled 2018-12-17 (×2): qty 500

## 2018-12-17 MED ORDER — METHYLPREDNISOLONE SODIUM SUCC 125 MG IJ SOLR
0.5000 mg/kg | Freq: Two times a day (BID) | INTRAMUSCULAR | Status: DC
  Administered 2018-12-18 (×3): 40.625 mg via INTRAVENOUS
  Filled 2018-12-17 (×3): qty 2

## 2018-12-17 MED ORDER — ONDANSETRON HCL 4 MG/2ML IJ SOLN: 4.0000 mg | Freq: Four times a day (QID) | INTRAMUSCULAR | Status: DC | PRN

## 2018-12-17 MED ORDER — POLYETHYL GLYCOL-PROPYL GLYCOL 0.4-0.3 % OP GEL: 1.0000 "application " | Freq: Three times a day (TID) | OPHTHALMIC | Status: DC

## 2018-12-17 MED ORDER — METHYLPREDNISOLONE SODIUM SUCC 125 MG IJ SOLR
125.0000 mg | Freq: Once | INTRAMUSCULAR | Status: AC
  Administered 2018-12-17: 125 mg via INTRAVENOUS
  Filled 2018-12-17: qty 2

## 2018-12-17 MED ORDER — ACETAMINOPHEN 325 MG PO TABS: 650.0000 mg | ORAL_TABLET | Freq: Four times a day (QID) | ORAL | Status: DC | PRN

## 2018-12-17 MED ORDER — SODIUM CHLORIDE 0.9 % IV SOLN
1.0000 g | INTRAVENOUS | Status: DC
  Administered 2018-12-17 – 2018-12-18 (×2): 1 g via INTRAVENOUS
  Filled 2018-12-17 (×2): qty 10

## 2018-12-17 MED ORDER — GUAIFENESIN 100 MG/5ML PO SOLN
400.0000 mg | Freq: Three times a day (TID) | ORAL | Status: DC | PRN
  Administered 2018-12-18: 400 mg via ORAL
  Filled 2018-12-17: qty 30

## 2018-12-17 MED ORDER — ALBUTEROL SULFATE HFA 108 (90 BASE) MCG/ACT IN AERS
2.0000 | INHALATION_SPRAY | Freq: Once | RESPIRATORY_TRACT | Status: AC
  Administered 2018-12-17: 2 via RESPIRATORY_TRACT
  Filled 2018-12-17: qty 6.7

## 2018-12-17 MED ORDER — INSULIN ASPART 100 UNIT/ML ~~LOC~~ SOLN
0.0000 [IU] | Freq: Three times a day (TID) | SUBCUTANEOUS | Status: DC
  Administered 2018-12-18 (×2): 15 [IU] via SUBCUTANEOUS
  Administered 2018-12-18 – 2018-12-19 (×2): 5 [IU] via SUBCUTANEOUS
  Administered 2018-12-19: 11 [IU] via SUBCUTANEOUS
  Administered 2018-12-19 – 2018-12-20 (×2): 5 [IU] via SUBCUTANEOUS
  Administered 2018-12-20: 11 [IU] via SUBCUTANEOUS
  Administered 2018-12-21 (×2): 15 [IU] via SUBCUTANEOUS
  Administered 2018-12-22: 11 [IU] via SUBCUTANEOUS
  Administered 2018-12-22: 8 [IU] via SUBCUTANEOUS
  Administered 2018-12-23: 11 [IU] via SUBCUTANEOUS

## 2018-12-17 MED ORDER — ENOXAPARIN SODIUM 40 MG/0.4ML ~~LOC~~ SOLN
40.0000 mg | SUBCUTANEOUS | Status: DC
  Administered 2018-12-18 – 2018-12-22 (×6): 40 mg via SUBCUTANEOUS
  Filled 2018-12-17 (×7): qty 0.4

## 2018-12-17 MED ORDER — SIMVASTATIN 20 MG PO TABS
20.0000 mg | ORAL_TABLET | Freq: Every day | ORAL | Status: DC
  Administered 2018-12-17 – 2018-12-22 (×6): 20 mg via ORAL
  Filled 2018-12-17 (×7): qty 1

## 2018-12-17 MED ORDER — POLYVINYL ALCOHOL 1.4 % OP SOLN: 1.0000 [drp] | Freq: Three times a day (TID) | OPHTHALMIC | Status: DC | PRN

## 2018-12-17 MED ORDER — AMLODIPINE BESYLATE 10 MG PO TABS
10.0000 mg | ORAL_TABLET | Freq: Every day | ORAL | Status: DC
  Administered 2018-12-18 – 2018-12-23 (×6): 10 mg via ORAL
  Filled 2018-12-17: qty 2
  Filled 2018-12-17 (×5): qty 1

## 2018-12-17 MED ORDER — ZOLPIDEM TARTRATE 5 MG PO TABS
5.0000 mg | ORAL_TABLET | Freq: Every evening | ORAL | Status: DC | PRN
  Administered 2018-12-19 – 2018-12-22 (×4): 5 mg via ORAL
  Filled 2018-12-17 (×4): qty 1

## 2018-12-17 MED ORDER — ONDANSETRON HCL 4 MG PO TABS: 4.0000 mg | ORAL_TABLET | Freq: Four times a day (QID) | ORAL | Status: DC | PRN

## 2018-12-17 MED ORDER — NORTRIPTYLINE HCL 25 MG PO CAPS
25.0000 mg | ORAL_CAPSULE | Freq: Every day | ORAL | Status: DC
  Administered 2018-12-18 – 2018-12-22 (×6): 25 mg via ORAL
  Filled 2018-12-17 (×8): qty 1

## 2018-12-17 MED ORDER — ALBUTEROL SULFATE HFA 108 (90 BASE) MCG/ACT IN AERS
2.0000 | INHALATION_SPRAY | RESPIRATORY_TRACT | Status: DC | PRN
  Filled 2018-12-17: qty 6.7

## 2018-12-17 MED ORDER — SODIUM CHLORIDE 0.9% FLUSH
3.0000 mL | Freq: Two times a day (BID) | INTRAVENOUS | Status: DC
  Administered 2018-12-18 – 2018-12-22 (×7): 3 mL via INTRAVENOUS

## 2018-12-17 MED ORDER — PANTOPRAZOLE SODIUM 40 MG PO TBEC
40.0000 mg | DELAYED_RELEASE_TABLET | Freq: Every day | ORAL | Status: DC
  Administered 2018-12-18 – 2018-12-23 (×6): 40 mg via ORAL
  Filled 2018-12-17 (×6): qty 1

## 2018-12-17 NOTE — ED Triage Notes (Signed)
Pt to triage via [facility]> Pt tested COVID + on Monday.  Pt from home.  C/o anxiety since being diagnosed.  C/o SOB.  Generalized body aches and fatigue.  Taking Tylenol and nebulizer at home.

## 2018-12-17 NOTE — H&P (Signed)
History and Physical    Sue [name] [id number] DOB: 1932-09-06 DOA: 12/17/2018  PCP: Merrilee Seashore, MD Consultants:  Elisha Ponder - podiatry Patient coming from:  Home - lives alone but her daughter has been staying with her; NOK: Daughter, Lenon Curt, [phone]  Chief Complaint: SOB, COVID  HPI: Sue [name] is a 85 y.o. female with medical history significant of macular degeneration; HTN; and DM presenting with worsening COVID-19 infection symptoms.  She was initially seen in the ER on 11/16 after 1 week of diarrhea and scratchy throat with nonproductive cough.  Symptoms started at her sister's funeral, which is likely where the patient was exposed to COVID-19.  She has continued to have watery diarrhea with some nausea without vomiting.  She has more recently developed mild SOB.  Her son was positive for COVID before her.  Her daughter reports good oxygen levels but some hypothermia in the 94-95 range since yesterday.  The diarrhea has been consistent with intermittent emesis.  She does not have known dementia but she has anxiety which makes her SOB worse at times.  Her father had a DNR and "she was fine with that."  She does not think she would want resuscitation.     ED Course:  COVID - progressively worsening diarrhea x 2 weeks, worsening SOB x 1 week.  COVID + 11/16.  CXR today with worsening PNA.  Na++ 135 -> 124.  Creatinine slightly increased.  No desaturation and not on O2 at this time.  Review of Systems: As per HPI; otherwise review of systems reviewed and negative.    Past Medical History:  Diagnosis Date  . Basal cell carcinoma 11/23/2017   left elbow, right neck  . Diabetes mellitus without complication ([place])   . Hypertension   . Hypertensive retinopathy    OU  . Macular degeneration    OU  . SCC (squamous cell carcinoma) 11/23/2017   right forehead    Past Surgical History:  Procedure Laterality Date  . ABDOMINAL HYSTERECTOMY    . CATARACT  EXTRACTION    . CHOLECYSTECTOMY    . EYE SURGERY    . SPINE SURGERY      Social History   Socioeconomic History  . Marital status: Married    Spouse name: Not on file  . Number of children: Not on file  . Years of education: Not on file  . Highest education level: Not on file  Occupational History  . Not on file  Social Needs  . Financial resource strain: Not on file  . Food insecurity    Worry: Not on file    Inability: Not on file  . Transportation needs    Medical: Not on file    Non-medical: Not on file  Tobacco Use  . Smoking status: Former Research scientist (life sciences)  . Smokeless tobacco: Never Used  Substance and Sexual Activity  . Alcohol use: No    Alcohol/week: 0.0 standard drinks  . Drug use: No  . Sexual activity: Not on file  Lifestyle  . Physical activity    Days per week: Not on file    Minutes per session: Not on file  . Stress: Not on file  Relationships  . Social Herbalist on phone: Not on file    Gets together: Not on file    Attends religious service: Not on file    Active member of club or organization: Not on file    Attends meetings of clubs or organizations: Not  on file    Relationship status: Not on file  . Intimate partner violence    Fear of current or ex partner: Not on file    Emotionally abused: Not on file    Physically abused: Not on file    Forced sexual activity: Not on file  Other Topics Concern  . Not on file  Social History Narrative  . Not on file    No Known Allergies  No family history on file.  Prior to Admission medications   Medication Sig Start Date End Date Taking? Authorizing Provider  amLODipine (NORVASC) 10 MG tablet Take 10 mg by mouth daily.  11/12/17   [provider]  B-D ULTRAFINE III SHORT PEN 31G X 8 MM MISC USE AS DIRECTED ONCE DAILY SUBCUTANEOUSLY 09/14/17   [provider]  calcium-vitamin D (OSCAL WITH D) 500-200 MG-UNIT tablet Take 1 tablet by mouth daily at 12 noon.    [provider]  Cobalamin Combinations (B-12) [phone] MCG SUBL Take 1 tablet by mouth daily.     [provider]  enalapril (VASOTEC) 10 MG tablet Take 10 mg by mouth 2 (two) times daily.     [provider]  enalapril (VASOTEC) 20 MG tablet  04/11/18   [provider]  esomeprazole (NEXIUM) 20 MG capsule Take 20 mg by mouth daily.    [provider]  glipiZIDE (GLUCOTROL) 10 MG tablet Take 10 mg by mouth 2 (two) times daily before a meal.    [provider]  glucose blood test strip OneTouch Ultra Test strips  TEST ONCE A DAY ONCE A DAY FINGERSTICK 90    [provider]  hydrochlorothiazide (HYDRODIURIL) 25 MG tablet Take 25 mg by mouth every morning.  11/12/17   [provider]  HYDROcodone-acetaminophen (NORCO) 5-325 MG tablet Take 1 tablet by mouth every 6 (six) hours as needed for moderate pain. 04/15/16   Horald Pollen, MD  Insulin Pen Needle (PEN NEEDLES) 32G X 4 MM MISC BD Ultra-Fine Nano Pen Needle 32 gauge x 5/32"  USE AS DIRECTED DAILY    [provider]  Lancets (ONETOUCH ULTRASOFT) lancets OneTouch UltraSoft Lancets  USE TWICE DAILY    [provider]  Multiple Vitamin (MULTIVITAMIN WITH MINERALS) TABS tablet Take 1 tablet by mouth daily.    [provider]  Multiple Vitamins-Minerals (EYE VITAMINS & MINERALS) TABS Take 1 tablet by mouth daily.    [provider]  nortriptyline (PAMELOR) 25 MG capsule Take 25 mg by mouth at bedtime.    [provider]  Omega-3 Fatty Acids (FISH OIL) 1000 MG CAPS Take 1,000 mg by mouth daily at 12 noon.     [provider]  omeprazole (PRILOSEC) 20 MG capsule  12/08/17   [provider]  ONE TOUCH ULTRA TEST test strip  12/07/17   [provider]  Polyethyl Glycol-Propyl Glycol (SYSTANE) 0.4-0.3 % GEL ophthalmic gel Place 1 application into both eyes 3 (three) times daily.    [provider]   PRESCRIPTION MEDICATION Inject as directed once.    [provider]  simvastatin (ZOCOR) 20 MG tablet Take 20 mg by mouth daily.    [provider]  TRESIBA FLEXTOUCH 200 UNIT/ML SOPN 22 UNITS ONCE A DAY IN THE EVENING 12/01/17   [provider]    Physical Exam: Vitals:   12/17/18 1345 12/17/18 1400 12/17/18 1415 12/17/18 1438  BP: (!) 148/76 (!) 148/67 138/63   Pulse: 91 95  99   Resp: (!) 21 (!) 23 20   Temp:      TempSrc:      SpO2: 95% 95% 96%   Weight:    81.6 kg  Height:    5\' 6"  (1.676 m)     . General:  Appears ill but is in NAD . Eyes:  PERRL, EOMI, normal lids, iris . ENT:  grossly normal hearing, lips & tongue, dry mm . Neck:  no LAD, masses or thyromegaly . Cardiovascular:  RRR, no m/r/g. No LE edema.  Marland Kitchen Respiratory:   Scattered rhonchi.  Normal respiratory effort. . Abdomen:  soft, NT, ND, NABS . Back:   normal alignment, no CVAT . Skin:  no rash or induration seen on limited exam . Musculoskeletal:  grossly normal tone BUE/BLE, good ROM, no bony abnormality . Psychiatric:  blunted mood and affect, speech fluent and appropriate, AOx3, mild confusion . Neurologic:  CN 2-12 grossly intact, moves all extremities in coordinated fashion, sensation intact    Radiological Exams on Admission: Dg Chest Portable 1 View  Result Date: 12/17/2018 CLINICAL DATA:  Shortness of breath.  COVID-19 EXAM: PORTABLE CHEST 1 VIEW COMPARISON:  Six days ago FINDINGS: Bilateral airspace disease densest in the right upper lobe where there is also some volume loss. Lung volumes are low. Normal heart size for technique. Aortic tortuosity. IMPRESSION: Worsening bilateral pneumonia. Electronically Signed   By: Monte Fantasia M.D.   On: 12/17/2018 09:59    EKG: Independently reviewed.  NSR with rate 83; nonspecific ST changes with no evidence of acute ischemia; NSCSLT   Labs on Admission: I have personally reviewed the available labs and imaging studies at the  time of the admission.  Pertinent labs:   Na++ 124; 135 on 11/16 CO2 20 BUN 10/Creatinine 1.23/GFR 40 - improved from 11/16 Albumin 3.2 AST 55/ALT 26; normal on 11/16 LDH 392 CRP 7.5 Lactate 1.5 WBC 7.2, +lymphopenia Hgb 11.3 - stable D-dimer 1.04 Fibrinogen 660 COVID POSITIVE on 11/16   Assessment/Plan Principal Problem:   Acute respiratory disease due to COVID-19 virus Active Problems:   Dehydration   Essential hypertension   Type 1 diabetes mellitus without complication (HCC)   Acute respiratory failure with hypoxia -Patient with presenting with persistent diarrhea and SOB in the setting of known COVID-19 infection -Multiple COVID family contacts after recent funeral of her sister -She does not currently have an O2 requirement despite worsening SOB and B PNA on CXR -COVID POSITIVE -The patient has comorbidities which may increase the risk for ARDS/MODS including: age, HTN, DM -Pertinent labs concerning for COVID include lymphopenia; increased BUN/Creatinine; increased LFTs; markedly elevated D-dimer (>1); markedly elevated CRP (>7); increased LDH; increased fibrinogen -CXR with multifocal opacities which may be c/w COVID vs. Multifocal PNA -Will treat with broad-spectrum antibiotics given procalcitonin >0.1.   -Will admit to [facility] for further evaluation, close monitoring, and treatment -Monitor on telemetry x at least 24 hours -At this time, will attempt to avoid use of aerosolized medications and use HFAs instead -Will check daily labs including BMP with Mag, Phos; LFTs; CBC with differential; CRP; ferritin; fibrinogen; D-dimer -Will order steroids (1 mg/kg divided BID) and Remdesivir (pharmacy consult) given +COVID test, +CXR -If the patient shows clinical deterioration, consider transfer to ICU with PCCM consultation -Will attempt to maintain euvolemia to a net negative fluid status; she currently appears to be dehydrated and so will receive IVF -Will ask the patient to  maintain an awake prone position for 16+ hours  a day, if possible, with a minimum of 2-3 hours at a time -With D-dimer <5, will use standard-dosed Lovenox for DVT prevention -Patient was seen wearing full PPE including: gown, gloves, head cover, N95, and face shield; donning and doffing was in compliance with current standards. -Will continue Lomotil prn diarrhea and Robitussin prn cough  Dehydration with hyponatremia -Patient with significant new hyponatremia and dry mucus membranes without evidence of significant renal failure (mild insufficiency is possible, no recent comparisons other than 11/16, when she was already sick) -Will give gentle IVF hydration overnight and suggest reconsideration in AM -Hold HCTZ, as this is likely contributing to her hyponatremia  HTN -Continue Norvasc  DM -Will check A1c -Continue Tresiba -Cover with moderate-scale SSI  HLD -Continue Zocor     DVT prophylaxis:  Lovenox  Code Status:  DNR - confirmed with patient/daughter Family Communication: None present; I spoke with the patient's daughter by telephone. Disposition Plan:  Home once clinically improved Consults called: None  Admission status: Admit - It is my clinical opinion that admission to INPATIENT is reasonable and necessary because of the expectation that this patient will require hospital care that crosses at least 2 midnights to treat this condition based on the medical complexity of the problems presented.  Given the aforementioned information, the predictability of an adverse outcome is felt to be significant.      Karmen Bongo MD [facility]   How to contact the [facility] Attending or Consulting provider [place] or covering provider during after hours [place], for this patient?  1. Check the care team in [facility] and look for a) attending/consulting [facility] provider listed and b) the [facility] team listed 2. Log into [email or web address] and use [place]'s universal password to access. If you do  not have the password, please contact the hospital operator. 3. Locate the [facility] provider you are looking for under [facility] and page to a number that you can be directly reached. 4. If you still have difficulty reaching the provider, please page the [facility] (Director on Call) for the Hospitalists listed on amion for assistance.   12/17/2018, 3:25 PM

## 2018-12-17 NOTE — ED Provider Notes (Signed)
[facility] EMERGENCY DEPARTMENT Provider Note   CSN: [id number] Arrival date & time: 12/17/18  0840     History   Chief Complaint Chief Complaint  Patient presents with   Cov+/SOB    HPI Sue [name] is a 85 y.o. female with history of diabetes mellitus, hypertension presents for evaluation of acute onset, progressively worsening diarrhea for 2 weeks, shortness of breath for 1 week.  She was seen and evaluated in the ED on 12/11/2018 for diarrhea, typically for 5 times daily watery nonbloody in nature.  Denies nausea or vomiting.  Complaining of lower abdominal pain at this time.  She was found to be stable for discharge home at her ED visit last week, tested positive for Covid.  She tells me that since her ED visit she has had progressively worsening shortness of breath, dyspnea on exertion, mild cough.  Denies fevers, chest pain.  Has been taking Tylenol with little relief.     The history is provided by the patient.    Past Medical History:  Diagnosis Date   Basal cell carcinoma 11/23/2017   left elbow, right neck   Diabetes mellitus without complication (HCC)    Hypertension    Hypertensive retinopathy    OU   Macular degeneration    OU   SCC (squamous cell carcinoma) 11/23/2017   right forehead    Patient Active Problem List   Diagnosis Date Noted   Diabetic ulcer of foot associated with diabetes mellitus due to underlying condition, limited to breakdown of skin ([place]) 01/11/2018   Right hip pain 04/15/2016   Right buttock pain 04/15/2016   Primary osteoarthritis of right hip 04/15/2016    Past Surgical History:  Procedure Laterality Date   ABDOMINAL HYSTERECTOMY     CATARACT EXTRACTION     CHOLECYSTECTOMY     EYE SURGERY     SPINE SURGERY       OB History   No obstetric history on file.      Home Medications    Prior to Admission medications   Medication Sig Start Date End Date Taking? Authorizing Provider    amLODipine (NORVASC) 10 MG tablet Take 10 mg by mouth daily.  11/12/17   [provider]  B-D ULTRAFINE III SHORT PEN 31G X 8 MM MISC USE AS DIRECTED ONCE DAILY SUBCUTANEOUSLY 09/14/17   [provider]  calcium-vitamin D (OSCAL WITH D) 500-200 MG-UNIT tablet Take 1 tablet by mouth daily at 12 noon.    [provider]  Cobalamin Combinations (B-12) [phone] MCG SUBL Take 1 tablet by mouth daily.     [provider]  enalapril (VASOTEC) 10 MG tablet Take 10 mg by mouth 2 (two) times daily.     [provider]  enalapril (VASOTEC) 20 MG tablet  04/11/18   [provider]  esomeprazole (NEXIUM) 20 MG capsule Take 20 mg by mouth daily.    [provider]  glipiZIDE (GLUCOTROL) 10 MG tablet Take 10 mg by mouth 2 (two) times daily before a meal.    [provider]  glucose blood test strip OneTouch Ultra Test strips  TEST ONCE A DAY ONCE A DAY FINGERSTICK 90    [provider]  hydrochlorothiazide (HYDRODIURIL) 25 MG tablet Take 25 mg by mouth every morning.  11/12/17   [provider]  HYDROcodone-acetaminophen (NORCO) 5-325 MG tablet Take 1 tablet by mouth every 6 (six) hours as needed for moderate pain. 04/15/16   Agustina Caroli  Jose, MD  Insulin Pen Needle (PEN NEEDLES) 32G X 4 MM MISC BD Ultra-Fine Nano Pen Needle 32 gauge x 5/32"  USE AS DIRECTED DAILY    [provider]  Lancets (ONETOUCH ULTRASOFT) lancets OneTouch UltraSoft Lancets  USE TWICE DAILY    [provider]  Multiple Vitamin (MULTIVITAMIN WITH MINERALS) TABS tablet Take 1 tablet by mouth daily.    [provider]  Multiple Vitamins-Minerals (EYE VITAMINS & MINERALS) TABS Take 1 tablet by mouth daily.    [provider]  nortriptyline (PAMELOR) 25 MG capsule Take 25 mg by mouth at bedtime.    [provider]  Omega-3 Fatty Acids (FISH OIL) 1000 MG CAPS Take 1,000 mg by mouth daily at 12 noon.      [provider]  omeprazole (PRILOSEC) 20 MG capsule  12/08/17   [provider]  ONE TOUCH ULTRA TEST test strip  12/07/17   [provider]  Polyethyl Glycol-Propyl Glycol (SYSTANE) 0.4-0.3 % GEL ophthalmic gel Place 1 application into both eyes 3 (three) times daily.    [provider]  PRESCRIPTION MEDICATION Inject as directed once.    [provider]  simvastatin (ZOCOR) 20 MG tablet Take 20 mg by mouth daily.    [provider]  TRESIBA FLEXTOUCH 200 UNIT/ML SOPN 22 UNITS ONCE A DAY IN THE EVENING 12/01/17   [provider]    Family History No family history on file.  Social History Social History   Tobacco Use   Smoking status: Former Smoker   Smokeless tobacco: Never Used  Substance Use Topics   Alcohol use: No    Alcohol/week: 0.0 standard drinks   Drug use: No     Allergies   Patient has no known allergies.   Review of Systems Review of Systems  Constitutional: Positive for fatigue. Negative for chills and fever.  Respiratory: Positive for cough and shortness of breath.   Cardiovascular: Negative for chest pain.  Gastrointestinal: Positive for abdominal pain and diarrhea. Negative for nausea and vomiting.  Neurological: Positive for weakness (generalized).  All other systems reviewed and are negative.    Physical Exam Updated Vital Signs BP (!) 143/80    Pulse 87    Temp 97.8 F (36.6 C) (Oral)    Resp 20    SpO2 98%   Physical Exam Vitals signs and nursing note reviewed.  Constitutional:      General: She is not in acute distress.    Appearance: She is well-developed.  HENT:     Head: Normocephalic and atraumatic.     Mouth/Throat:     Mouth: Mucous membranes are dry.  Eyes:     General:        Right eye: No discharge.        Left eye: No discharge.     Conjunctiva/sclera: Conjunctivae normal.  Neck:     Vascular: No JVD.     Trachea: No tracheal deviation.  Cardiovascular:      Rate and Rhythm: Normal rate and regular rhythm.  Pulmonary:     Comments: Tachypneic, speaking in short phrases, bibasilar rales noted with scattered soft expiratory wheezes. Abdominal:     General: Bowel sounds are normal. There is no distension.     Palpations: Abdomen is soft.     Tenderness: There is abdominal tenderness in the right lower quadrant, suprapubic area and left lower quadrant. There is no right CVA tenderness, left CVA tenderness, guarding or rebound.  Skin:  General: Skin is warm and dry.     Findings: No erythema.  Neurological:     Mental Status: She is alert.  Psychiatric:        Behavior: Behavior normal.      ED Treatments / Results  Labs (all labs ordered are listed, but only abnormal results are displayed) Labs Reviewed  CBC WITH DIFFERENTIAL/PLATELET - Abnormal; Notable for the following components:      Result Value   Hemoglobin 11.3 (*)    HCT 34.5 (*)    Lymphs Abs 0.6 (*)    Abs Immature Granulocytes 0.15 (*)    All other components within normal limits  COMPREHENSIVE METABOLIC PANEL - Abnormal; Notable for the following components:   Sodium 124 (*)    Chloride 89 (*)    CO2 20 (*)    Creatinine, Ser 1.23 (*)    Calcium 8.2 (*)    Albumin 3.2 (*)    AST 55 (*)    Total Bilirubin 0.2 (*)    GFR calc non Af Amer 40 (*)    GFR calc Af Amer 47 (*)    All other components within normal limits  D-DIMER, QUANTITATIVE (NOT AT [facility]) - Abnormal; Notable for the following components:   D-Dimer, Quant 1.04 (*)    All other components within normal limits  LACTATE DEHYDROGENASE - Abnormal; Notable for the following components:   LDH 392 (*)    All other components within normal limits  FIBRINOGEN - Abnormal; Notable for the following components:   Fibrinogen 660 (*)    All other components within normal limits  C-REACTIVE PROTEIN - Abnormal; Notable for the following components:   CRP 7.5 (*)    All other components within normal limits    CULTURE, BLOOD (ROUTINE X 2)  CULTURE, BLOOD (ROUTINE X 2)  LACTIC ACID, PLASMA  FERRITIN  TRIGLYCERIDES  LACTIC ACID, PLASMA  PROCALCITONIN    EKG EKG Interpretation  Date/Time:  Sunday December 17 2018 08:52:54 EST Ventricular Rate:  83 PR Interval:  190 QRS Duration: 90 QT Interval:  392 QTC Calculation: 460 R Axis:   -29 Text Interpretation: Normal sinus rhythm Possible Anterolateral infarct , age undetermined Abnormal ECG No significant change since last tracing Confirmed by Gareth Morgan [phone]) on 12/17/2018 12:44:01 PM   Radiology Dg Chest Portable 1 View  Result Date: 12/17/2018 CLINICAL DATA:  Shortness of breath.  COVID-19 EXAM: PORTABLE CHEST 1 VIEW COMPARISON:  Six days ago FINDINGS: Bilateral airspace disease densest in the right upper lobe where there is also some volume loss. Lung volumes are low. Normal heart size for technique. Aortic tortuosity. IMPRESSION: Worsening bilateral pneumonia. Electronically Signed   By: Monte Fantasia M.D.   On: 12/17/2018 09:59    Procedures Procedures (including critical care time)  Medications Ordered in ED Medications  methylPREDNISolone sodium succinate (SOLU-MEDROL) 125 mg/2 mL injection 125 mg (has no administration in time range)  albuterol (VENTOLIN HFA) 108 (90 Base) MCG/ACT inhaler 2 puff (has no administration in time range)     Initial Impression / Assessment and Plan / ED Course  I have reviewed the triage vital signs and the nursing notes.  Pertinent labs & imaging results that were available during my care of the patient were reviewed by me and considered in my medical decision making (see chart for details).        Sue [name] was evaluated in Emergency Department on 12/17/2018 for the symptoms described in the history of present  illness. She was evaluated in the context of the global COVID-19 pandemic, which necessitated consideration that the patient might be at risk for infection with the  SARS-CoV-2 virus that causes COVID-19. Institutional protocols and algorithms that pertain to the evaluation of patients at risk for COVID-19 are in a state of rapid change based on information released by regulatory bodies including the CDC and federal and state organizations. These policies and algorithms were followed during the patient's care in the ED.  Patient presenting for worsening diarrhea and dyspnea on exertion.  Seen in the ED 6 days ago found to be Covid positive.  In the ED today she is afebrile, mildly hypertensive but vital signs otherwise stable.  Appears to be short of breath at rest but SPO2 saturations are within normal limits and she was ambulated with stable SPO2 saturations.  Chest x-ray shows worsening bilateral pneumonia and she has some mild wheezing on auscultation of the lungs.  We will give Solu-Medrol and 2 puffs albuterol.  Lab work reviewed by me shows no leukocytosis, stable anemia, sodium down from 135 six days ago to 124 today.  Mild bump in creatinine but BUN within normal limits.  Suspect she is dehydrated in the setting of COVID-19 infection.  Many of inflammatory markers are elevated as well.  Low suspicion of PE at this time.  Given significant decrease in her sodium since her recent ED visit I feel she would benefit from admission to the hospital for further evaluation and management.  Spoke with Dr. Lorin Mercy with [facility] hospitalist service who agrees to assume care of patient.  Final Clinical Impressions(s) / ED Diagnoses   Final diagnoses:  COVID-19 virus infection  Hyponatremia  Elevated serum creatinine    ED Discharge Orders    None       Debroah Baller 12/17/18 1311    Gareth Morgan, MD 12/19/18 1304

## 2018-12-17 NOTE — ED Notes (Signed)
Pt ambulated well. Pt was a little shaky but ambulated with assistance. O2 stayed between 94-98% on Room Air.

## 2018-12-17 NOTE — ED Notes (Addendum)
Thayer Headings pts Daughter Cell: [phone] Please call daughter when pt goes back to room.

## 2018-12-17 NOTE — Progress Notes (Signed)
Pharmacy Consult - Remdesivir Dosing  71 yof presenting to the ED COVID-19 positive on 11/16 and discharged home, now returning with worsening SOB and worsening bilateral PNA on CXR. Pharmacy consulted to dose Remdesivir. ALT<220.   Plan: Remdesivir 200mg  IV x 1; then 100mg  IV q24h to complete 5 total doses Monitor clinical progress, LFTs   Elicia Lamp, PharmD, BCPS Please check AMION for all [place] contact numbers Clinical Pharmacist 12/17/2018 2:33 PM

## 2018-12-18 DIAGNOSIS — N1832 Chronic kidney disease, stage 3b: Secondary | ICD-10-CM

## 2018-12-18 DIAGNOSIS — J1289 Other viral pneumonia: Secondary | ICD-10-CM

## 2018-12-18 DIAGNOSIS — E860 Dehydration: Secondary | ICD-10-CM

## 2018-12-18 DIAGNOSIS — E871 Hypo-osmolality and hyponatremia: Secondary | ICD-10-CM

## 2018-12-18 LAB — CBC WITH DIFFERENTIAL/PLATELET
Abs Immature Granulocytes: 0.1 10*3/uL — ABNORMAL HIGH (ref 0.00–0.07)
Basophils Absolute: 0 10*3/uL (ref 0.0–0.1)
Basophils Relative: 0 %
Eosinophils Absolute: 0 10*3/uL (ref 0.0–0.5)
Eosinophils Relative: 0 %
HCT: 33.7 % — ABNORMAL LOW (ref 36.0–46.0)
Hemoglobin: 10.9 g/dL — ABNORMAL LOW (ref 12.0–15.0)
Immature Granulocytes: 2 %
Lymphocytes Relative: 8 %
Lymphs Abs: 0.4 10*3/uL — ABNORMAL LOW (ref 0.7–4.0)
MCH: 28 pg (ref 26.0–34.0)
MCHC: 32.3 g/dL (ref 30.0–36.0)
MCV: 86.6 fL (ref 80.0–100.0)
Monocytes Absolute: 0.2 10*3/uL (ref 0.1–1.0)
Monocytes Relative: 3 %
Neutro Abs: 3.9 10*3/uL (ref 1.7–7.7)
Neutrophils Relative %: 87 %
Platelets: 412 10*3/uL — ABNORMAL HIGH (ref 150–400)
RBC: 3.89 MIL/uL (ref 3.87–5.11)
RDW: 13.8 % (ref 11.5–15.5)
WBC: 4.5 10*3/uL (ref 4.0–10.5)
nRBC: 0 % (ref 0.0–0.2)

## 2018-12-18 LAB — COMPREHENSIVE METABOLIC PANEL
ALT: 25 U/L (ref 0–44)
AST: 34 U/L (ref 15–41)
Albumin: 2.8 g/dL — ABNORMAL LOW (ref 3.5–5.0)
Alkaline Phosphatase: 98 U/L (ref 38–126)
Anion gap: 15 (ref 5–15)
BUN: 16 mg/dL (ref 8–23)
CO2: 22 mmol/L (ref 22–32)
Calcium: 8.4 mg/dL — ABNORMAL LOW (ref 8.9–10.3)
Chloride: 93 mmol/L — ABNORMAL LOW (ref 98–111)
Creatinine, Ser: 1.3 mg/dL — ABNORMAL HIGH (ref 0.44–1.00)
GFR calc Af Amer: 44 mL/min — ABNORMAL LOW (ref >60)
GFR calc non Af Amer: 38 mL/min — ABNORMAL LOW (ref >60)
Glucose, Bld: 367 mg/dL — ABNORMAL HIGH (ref 70–99)
Potassium: 4.5 mmol/L (ref 3.5–5.1)
Sodium: 130 mmol/L — ABNORMAL LOW (ref 135–145)
Total Bilirubin: 0.9 mg/dL (ref 0.3–1.2)
Total Protein: 6.2 g/dL — ABNORMAL LOW (ref 6.5–8.1)

## 2018-12-18 LAB — FERRITIN: Ferritin: 199 ng/mL (ref 11–307)

## 2018-12-18 LAB — CBG MONITORING, ED
Glucose-Capillary: 71 mg/dL (ref 70–99)
Glucose-Capillary: 367 mg/dL — ABNORMAL HIGH (ref 70–99)
Glucose-Capillary: 239 mg/dL — ABNORMAL HIGH (ref 70–99)

## 2018-12-18 LAB — MAGNESIUM: Magnesium: 1.6 mg/dL — ABNORMAL LOW (ref 1.7–2.4)

## 2018-12-18 LAB — C-REACTIVE PROTEIN: CRP: 3.8 mg/dL — ABNORMAL HIGH (ref <1.0)

## 2018-12-18 LAB — D-DIMER, QUANTITATIVE: D-Dimer, Quant: 0.59 ug/mL-FEU — ABNORMAL HIGH (ref 0.00–0.50)

## 2018-12-18 LAB — GLUCOSE, CAPILLARY
Glucose-Capillary: 324 mg/dL — ABNORMAL HIGH (ref 70–99)
Glucose-Capillary: 228 mg/dL — ABNORMAL HIGH (ref 70–99)

## 2018-12-18 LAB — PHOSPHORUS: Phosphorus: 3.4 mg/dL (ref 2.5–4.6)

## 2018-12-18 MED ORDER — MAGNESIUM SULFATE 2 GM/50ML IV SOLN
2.0000 g | Freq: Once | INTRAVENOUS | Status: DC
  Filled 2018-12-18: qty 50

## 2018-12-18 MED ORDER — ADULT MULTIVITAMIN W/MINERALS CH
1.0000 | ORAL_TABLET | Freq: Every day | ORAL | Status: DC
  Administered 2018-12-18 – 2018-12-23 (×6): 1 via ORAL
  Filled 2018-12-18 (×6): qty 1

## 2018-12-18 MED ORDER — INSULIN DETEMIR 100 UNIT/ML ~~LOC~~ SOLN
14.0000 [IU] | Freq: Two times a day (BID) | SUBCUTANEOUS | Status: DC
  Filled 2018-12-18 (×3): qty 0.14

## 2018-12-18 MED ORDER — INSULIN ASPART 100 UNIT/ML ~~LOC~~ SOLN
4.0000 [IU] | Freq: Three times a day (TID) | SUBCUTANEOUS | Status: DC
  Administered 2018-12-18 – 2018-12-20 (×6): 4 [IU] via SUBCUTANEOUS

## 2018-12-18 MED ORDER — CALCIUM CARBONATE-VITAMIN D 500-200 MG-UNIT PO TABS
1.0000 | ORAL_TABLET | Freq: Every day | ORAL | Status: DC
  Administered 2018-12-18 – 2018-12-23 (×8): 1 via ORAL
  Filled 2018-12-18 (×6): qty 1

## 2018-12-18 MED ORDER — VITAMIN B-12 1000 MCG PO TABS
1000.0000 ug | ORAL_TABLET | Freq: Every day | ORAL | Status: DC
  Administered 2018-12-18 – 2018-12-23 (×6): 1000 ug via ORAL
  Filled 2018-12-18 (×5): qty 1

## 2018-12-18 MED ORDER — MAGNESIUM SULFATE 2 GM/50ML IV SOLN
2.0000 g | Freq: Once | INTRAVENOUS | Status: AC
  Administered 2018-12-18: 2 g via INTRAVENOUS

## 2018-12-18 NOTE — Progress Notes (Signed)
Inpatient Diabetes Program Recommendations  AACE/ADA: New Consensus Statement on Inpatient Glycemic Control (2015)  Target Ranges:  Prepandial:   less than 140 mg/dL      Peak postprandial:   less than 180 mg/dL (1-2 hours)      Critically ill patients:  140 - 180 mg/dL   Lab Results  Component Value Date   GLUCAP 367 (H) 12/18/2018   HGBA1C 9.1 (H) 12/17/2018    Review of Glycemic Control Results for MAXIME, NEEF (MRN [id number]) as of 12/18/2018 10:53  Ref. Range 12/17/2018 23:49 12/18/2018 09:36  Glucose-Capillary Latest Ref Range: 70 - 99 mg/dL 267 (H) 367 (H)   Diabetes history: DM 2 Outpatient Diabetes medications:  Tresiba 22 units daily, Glucotrol 10 mg bid Current orders for Inpatient glycemic control:  Lantus 22 units q HS, Novolog moderate tid with meals and HS Solumedrol 40.625 mg bid Inpatient Diabetes Program Recommendations:    Consider d/c of Lantus and switch to Levemir 14 units bid (this allows for bid).  Also please add Novolog meal coverage 4 units tid with meals.  May also consider adding Tradjenta 5 mg daily.   Thanks  Adah Perl, RN, BC-ADM Inpatient Diabetes Coordinator Pager [phone] (8a-5p)

## 2018-12-18 NOTE — ED Notes (Signed)
+  Tele [facility]

## 2018-12-18 NOTE — Progress Notes (Addendum)
PROGRESS NOTE   Sue [name]  [id number]    DOB: [birth year]    DOA: 12/17/2018  PCP: Merrilee Seashore, MD   I have briefly reviewed patients previous medical records in [facility].  Chief Complaint  Patient presents with  . Covid positive/SOB    Brief Narrative:  85 year old female, reportedly lives alone but has poor kids who live in town and keep a close eye on her, ambulates with the help of a cane, PMH of DM2, HTN, macular degeneration, HLD, GERD, anxiety/depression, seen in the ER on 11/1:16 week of diarrhea, scratchy throat and nonproductive cough.  Her symptoms reportedly started at her sisters funeral.  She continues to have watery diarrhea with some nausea without vomiting and recently developed dyspnea.  Her son tested positive for Covid before her.  As per family, she had some hypothermia, 94-95 F range 1 day prior to admission.  Admitted for COVID-19 pneumonia with associated diarrhea complicated by dehydration with hyponatremia.  Improved.  Transferred to [facility] hospital   Assessment & Plan:   Principal Problem:   Acute respiratory disease due to COVID-19 virus Active Problems:   Dehydration   Essential hypertension   Type 1 diabetes mellitus without complication ([place])   [id number] pneumonia  Presented with persistent diarrhea and dyspnea.  Suspect that she contracted this recently at her sister's funeral.  COVID-19 testing positive on 11/16.  Elevated inflammatory markers (CRP 7.5, D-dimer 1.04, fibrinogen 660, LDH 392).  Ferritin normal.  Chest x-ray 11/22 showed worsening bilateral pneumonia.  Blood cultures x2: Pending.  Do not see where patient was really hypoxic since admission.  Saturating in the mid to high 90s on room air.  Patient was started on IV Solu-Medrol and remdesivir.  Consider changing Solu-Medrol to dexamethasone.  Also on empiric IV ceftriaxone and azithromycin although does not appear to be bacterial pneumonia and may consider  stopping this if cultures continue to be negative.  Overall seems better.  Diarrhea seems to have improved.  Dehydration with hyponatremia  Secondary to GI losses and poor oral intake.  Briefly hydrated with IV fluids.  HCTZ held.  Serum sodium has improved from 124-130 which should be higher when corrected for hyperglycemia.  Encourage oral intake.  Follow BMP.  Chronic kidney disease, suspect stage IIIb  Baseline creatinine not known.  Last creatinine 0.93 in 2011.  Presented with creatinine of 1.35 which may be her baseline.  Follow BMP daily.  Hypomagnesemia  Magnesium 1.6.  Replace and follow.  Essential hypertension  Continue amlodipine 10 mg daily.  Mildly uncontrolled.  Monitor and consider IV hydralazine as needed.  Holding HCTZ and Vasotec.  If creatinine remains stable then may consider resuming Vasotec.  Uncontrolled type II DM with hyperglycemia and hyperlipidemia  A1c 9.1.  Glycemic control worsened due to steroids.  Discussed him in detail with DM coordinator and as per recommendation is changed Lantus to Levemir 14 units twice daily (for ease of adjusting insulins as needed), continue NovoLog SSI and added mealtime NovoLog 4 units 3 times daily.  Monitor closely and adjust insulins as needed  Holding Glucotrol.  Hyperlipidemia  Continue statins.  Body mass index is 29.04 kg/m./Overweight  Blood cultures 1 of 4 bottles (anaerobic bottle only) positive for gram-positive cocci  As per communication with pharmacy, suspect contaminant.  Follow final cultures.  No change in current antibiotic regimen.  Anemia  Follow CBCs  GERD  Continue PPI.   DVT prophylaxis: Lovenox Code Status: DNR Family Communication: None at  bedside.  I discussed in detail with patient's daughter via phone, updated care and answered questions.  She indicates that at least 6 family members developed Covid after the patient's sister's funeral recently. Disposition: Patient  transferred to [facility] Covid hospital.  DC home pending clinical improvement   Consultants:  None  Procedures:  None  Antimicrobials:  IV ceftriaxone and azithromycin.   Subjective: Patient was seen in the [facility] ED prior to transfer to [facility] hospital.  Patient reports that she feels "okay".  Ate breakfast well without nausea or vomiting.  Wanting to urinate but has a pure wick in place.  Reports some dyspnea and dry cough but denies diarrhea.  No other complaints reported  Objective:  Vitals:   12/18/18 1315 12/18/18 1330 12/18/18 1345 12/18/18 1500  BP: (!) 165/91 (!) 154/143 (!) 168/87 (!) 160/79  Pulse:    (!) 106  Resp: 17 20 20  (!) 25  Temp:    98 F (36.7 C)  TempSrc:    Oral  SpO2:    96%  Weight:      Height:        Examination:  General exam: Pleasant elderly female, moderately built and nourished sitting up comfortably in bed without distress. Respiratory system: Slightly harsh breath sounds bilaterally with occasional crackles but no wheezing or rhonchi.  No increased work of breathing.  Able to speak in full sentences. Cardiovascular system: S1 & S2 heard, RRR. No JVD, murmurs, rubs, gallops or clicks. No pedal edema. Gastrointestinal system: Abdomen is nondistended, soft and nontender. No organomegaly or masses felt. Normal bowel sounds heard. Central nervous system: Alert and oriented x2. No focal neurological deficits. Extremities: Symmetric 5 x 5 power. Skin: No rashes, lesions or ulcers Psychiatry: Judgement and insight appear normal. Mood & affect, appeared slightly anxious.     Data Reviewed: I have personally reviewed following labs and imaging studies   CBC: Recent Labs  Lab 12/17/18 1135 12/18/18 0933  WBC 7.2 4.5  NEUTROABS 5.9 3.9  HGB 11.3* 10.9*  HCT 34.5* 33.7*  MCV 84.4 86.6  PLT 358 412*    Basic Metabolic Panel: Recent Labs  Lab 12/17/18 1135 12/18/18 0933  NA 124* 130*  K 4.6 4.5  CL 89* 93*  CO2 20* 22   GLUCOSE 77 367*  BUN 10 16  CREATININE 1.23* 1.30*  CALCIUM 8.2* 8.4*  MG  --  1.6*  PHOS  --  3.4    Liver Function Tests: Recent Labs  Lab 12/17/18 1135 12/18/18 0933  AST 55* 34  ALT 26 25  ALKPHOS 107 98  BILITOT 0.2* 0.9  PROT 6.8 6.2*  ALBUMIN 3.2* 2.8*    CBG: Recent Labs  Lab 12/17/18 1703 12/17/18 2349 12/18/18 0936 12/18/18 1224  GLUCAP 71 267* 367* 239*    Recent Results (from the past 240 hour(s))  SARS CORONAVIRUS 2 (TAT 6-24 HRS) Nasopharyngeal Nasopharyngeal Swab     Status: Abnormal   Collection Time: 12/11/18  3:51 PM   Specimen: Nasopharyngeal Swab  Result Value Ref Range Status   SARS Coronavirus 2 POSITIVE (A) NEGATIVE Final    Comment: RESULT CALLED TO, READ BACK BY AND VERIFIED WITH: EMAILED Aretta Nip 0154 12/12/18 G.MCADOO (NOTE) SARS-CoV-2 target nucleic acids are DETECTED. The SARS-CoV-2 RNA is generally detectable in upper and lower respiratory specimens during the acute phase of infection. Positive results are indicative of active infection with SARS-CoV-2. Clinical  correlation with patient history and other diagnostic information is necessary to  determine patient infection status. Positive results do  not rule out bacterial infection or co-infection with other viruses. The expected result is Negative. Fact Sheet for Patients: SugarRoll.be Fact Sheet for Healthcare Providers: https://www.woods-mathews.com/ This test is not yet approved or cleared by the [place] FDA and  has been authorized for detection and/or diagnosis of SARS-CoV-2 by FDA under an Emergency Use Authorization (EUA). This EUA will remain  in effect (meaning this test can be used ) for the duration of the COVID-19 declaration under Section 564(b)(1) of the Act, 21 U.S.C. section 360bbb-3(b)(1), unless the authorization is terminated or revoked sooner. Performed at [facility], [place] [address]  [place], [place] [address]   Blood Culture (routine x 2)     Status: None (Preliminary result)   Collection Time: 12/17/18 11:53 AM   Specimen: BLOOD  Result Value Ref Range Status   Specimen Description BLOOD RIGHT ANTECUBITAL  Final   Special Requests   Final    BOTTLES DRAWN AEROBIC AND ANAEROBIC Blood Culture results may not be optimal due to an inadequate volume of blood received in culture bottles   Culture   Final    NO GROWTH < 24 HOURS Performed at [facility], [place] [address], [place], [place] [address]    Report Status PENDING  Incomplete  Blood Culture (routine x 2)     Status: None (Preliminary result)   Collection Time: 12/17/18 11:54 AM   Specimen: BLOOD LEFT HAND  Result Value Ref Range Status   Specimen Description BLOOD LEFT HAND  Final   Special Requests   Final    BOTTLES DRAWN AEROBIC AND ANAEROBIC Blood Culture results may not be optimal due to an inadequate volume of blood received in culture bottles   Culture  Setup Time   Final    GRAM POSITIVE COCCI IN CLUSTERS ANAEROBIC BOTTLE ONLY CRITICAL RESULT CALLED TO, READ BACK BY AND VERIFIED WITH: Chefornak Mayetta [id number] FCP Performed at [facility], [place] [address] [place],  [address]    Culture GRAM POSITIVE COCCI  Final   Report Status PENDING  Incomplete      Radiology Studies: Dg Chest Portable 1 View  Result Date: 12/17/2018 CLINICAL DATA:  Shortness of breath.  COVID-19 EXAM: PORTABLE CHEST 1 VIEW COMPARISON:  Six days ago FINDINGS: Bilateral airspace disease densest in the right upper lobe where there is also some volume loss. Lung volumes are low. Normal heart size for technique. Aortic tortuosity. IMPRESSION: Worsening bilateral pneumonia. Electronically Signed   By: Monte Fantasia M.D.   On: 12/17/2018 09:59          Scheduled Meds: . amLODipine  10 mg Oral Daily  . enoxaparin (LOVENOX) injection  40 mg Subcutaneous Q24H  . insulin aspart  0-15 Units Subcutaneous  TID WC  . insulin aspart  0-5 Units Subcutaneous QHS  . insulin aspart  4 Units Subcutaneous TID WC  . insulin detemir  14 Units Subcutaneous BID  . methylPREDNISolone (SOLU-MEDROL) injection  0.5 mg/kg Intravenous Q12H  . nortriptyline  25 mg Oral QHS  . pantoprazole  40 mg Oral Daily  . simvastatin  20 mg Oral Daily  . sodium chloride flush  3 mL Intravenous Q12H   Continuous Infusions: . sodium chloride    . azithromycin Stopped (12/17/18 1914)  . cefTRIAXone (ROCEPHIN)  IV Stopped (12/17/18 1914)     LOS: 1 day     Vernell Leep, MD, [place], [facility]. [facility]  To contact the attending provider between 7A-7P or the covering provider during after hours 7P-7A, please log into the web site [email or web address] and access using universal [place] password for that web site. If you do not have the password, please call the hospital operator.  12/18/2018, 4:47 PM

## 2018-12-18 NOTE — ED Notes (Signed)
Lunch Tray Ordered @ 1059.  

## 2018-12-18 NOTE — Progress Notes (Signed)
Report received from [place], Therapist, sports at [facility].    Pt arrived to [facility] room 101.  VSS on RA.  Pt is alert to person and place.  Confused to date and situation.  Reorientation provided.  Pt oriented to room and equipment.  Pt placed on tele monitoring.  Physician informed of patient's arrival.  Awaiting orders.

## 2018-12-18 NOTE — ED Notes (Signed)
Breakfast Ordered 

## 2018-12-18 NOTE — Progress Notes (Signed)
PHARMACY - PHYSICIAN COMMUNICATION CRITICAL VALUE ALERT - BLOOD CULTURE IDENTIFICATION (BCID)  Sue [name] is an 85 y.o. female who presented to [facility] on 12/17/2018 with a chief complaint of SOB due to COVID-19.   Assessment: blood culture:1 of 4 bottles (anaerobic) positive. Gram stain shows gram positive cocci in clusters. Most likely a contaminant.  Name of physician (or Provider) Contacted: Dr. Algis Liming  Current antibiotics: azithromycin 500 mg IV every 24 hours, ceftriaxone 1 g IV every 24 hours  Changes to prescribed antibiotics recommended: No changes at this time.  No results found for this or any previous visit.  Ladoris Gene, PharmD Candidate 12/18/2018  8:45 AM

## 2018-12-18 NOTE — ED Notes (Signed)
Attempted to call report. Nurse unavailable and will call back for report

## 2018-12-18 NOTE — ED Notes (Signed)
Called [facility]. No one available for report. [facility] here for transport

## 2018-12-18 NOTE — Plan of Care (Signed)
Erline Levine, RN

## 2018-12-19 LAB — CBC WITH DIFFERENTIAL/PLATELET
Abs Immature Granulocytes: 0.21 10*3/uL — ABNORMAL HIGH (ref 0.00–0.07)
Basophils Absolute: 0 10*3/uL (ref 0.0–0.1)
Basophils Relative: 0 %
Eosinophils Absolute: 0 10*3/uL (ref 0.0–0.5)
Eosinophils Relative: 0 %
HCT: 30.3 % — ABNORMAL LOW (ref 36.0–46.0)
Hemoglobin: 9.8 g/dL — ABNORMAL LOW (ref 12.0–15.0)
Immature Granulocytes: 3 %
Lymphocytes Relative: 6 %
Lymphs Abs: 0.5 10*3/uL — ABNORMAL LOW (ref 0.7–4.0)
MCH: 27.6 pg (ref 26.0–34.0)
MCHC: 32.3 g/dL (ref 30.0–36.0)
MCV: 85.4 fL (ref 80.0–100.0)
Monocytes Absolute: 0.6 10*3/uL (ref 0.1–1.0)
Monocytes Relative: 7 %
Neutro Abs: 7.1 10*3/uL (ref 1.7–7.7)
Neutrophils Relative %: 84 %
Platelets: 431 10*3/uL — ABNORMAL HIGH (ref 150–400)
RBC: 3.55 MIL/uL — ABNORMAL LOW (ref 3.87–5.11)
RDW: 13.7 % (ref 11.5–15.5)
WBC: 8.4 10*3/uL (ref 4.0–10.5)
nRBC: 0 % (ref 0.0–0.2)

## 2018-12-19 LAB — D-DIMER, QUANTITATIVE: D-Dimer, Quant: 0.46 ug/mL-FEU (ref 0.00–0.50)

## 2018-12-19 LAB — GLUCOSE, CAPILLARY
Glucose-Capillary: 247 mg/dL — ABNORMAL HIGH (ref 70–99)
Glucose-Capillary: 233 mg/dL — ABNORMAL HIGH (ref 70–99)
Glucose-Capillary: 282 mg/dL — ABNORMAL HIGH (ref 70–99)
Glucose-Capillary: 368 mg/dL — ABNORMAL HIGH (ref 70–99)
Glucose-Capillary: 396 mg/dL — ABNORMAL HIGH (ref 70–99)
Glucose-Capillary: 237 mg/dL — ABNORMAL HIGH (ref 70–99)

## 2018-12-19 LAB — COMPREHENSIVE METABOLIC PANEL
ALT: 23 U/L (ref 0–44)
AST: 30 U/L (ref 15–41)
Albumin: 2.9 g/dL — ABNORMAL LOW (ref 3.5–5.0)
Alkaline Phosphatase: 98 U/L (ref 38–126)
Anion gap: 11 (ref 5–15)
BUN: 30 mg/dL — ABNORMAL HIGH (ref 8–23)
CO2: 24 mmol/L (ref 22–32)
Calcium: 9 mg/dL (ref 8.9–10.3)
Chloride: 98 mmol/L (ref 98–111)
Creatinine, Ser: 1.34 mg/dL — ABNORMAL HIGH (ref 0.44–1.00)
GFR calc Af Amer: 42 mL/min — ABNORMAL LOW (ref >60)
GFR calc non Af Amer: 36 mL/min — ABNORMAL LOW (ref >60)
Glucose, Bld: 164 mg/dL — ABNORMAL HIGH (ref 70–99)
Potassium: 4.6 mmol/L (ref 3.5–5.1)
Sodium: 133 mmol/L — ABNORMAL LOW (ref 135–145)
Total Bilirubin: 0.4 mg/dL (ref 0.3–1.2)
Total Protein: 6.2 g/dL — ABNORMAL LOW (ref 6.5–8.1)

## 2018-12-19 LAB — URINALYSIS, ROUTINE W REFLEX MICROSCOPIC
Bilirubin Urine: NEGATIVE
Glucose, UA: 50 mg/dL — AB
Hgb urine dipstick: NEGATIVE
Ketones, ur: NEGATIVE mg/dL
Leukocytes,Ua: NEGATIVE
Nitrite: NEGATIVE
Protein, ur: NEGATIVE mg/dL
Specific Gravity, Urine: 1.006 (ref 1.005–1.030)
pH: 7 (ref 5.0–8.0)

## 2018-12-19 LAB — TYPE AND SCREEN
ABO/RH(D): A POS
Antibody Screen: NEGATIVE

## 2018-12-19 LAB — C-REACTIVE PROTEIN: CRP: 1.5 mg/dL — ABNORMAL HIGH (ref <1.0)

## 2018-12-19 LAB — MAGNESIUM: Magnesium: 2.5 mg/dL — ABNORMAL HIGH (ref 1.7–2.4)

## 2018-12-19 LAB — ABO/RH: ABO/RH(D): A POS

## 2018-12-19 LAB — BRAIN NATRIURETIC PEPTIDE: B Natriuretic Peptide: 75 pg/mL (ref 0.0–100.0)

## 2018-12-19 MED ORDER — ENSURE ENLIVE PO LIQD
237.0000 mL | Freq: Two times a day (BID) | ORAL | Status: DC
  Administered 2018-12-20 – 2018-12-23 (×8): 237 mL via ORAL

## 2018-12-19 MED ORDER — METHYLPREDNISOLONE SODIUM SUCC 40 MG IJ SOLR
40.0000 mg | Freq: Every day | INTRAMUSCULAR | Status: DC
  Administered 2018-12-19 – 2018-12-23 (×5): 40 mg via INTRAVENOUS
  Filled 2018-12-19 (×5): qty 1

## 2018-12-19 MED ORDER — SODIUM CHLORIDE 0.9 % IV SOLN
100.0000 mg | INTRAVENOUS | Status: AC
  Administered 2018-12-20 – 2018-12-23 (×4): 100 mg via INTRAVENOUS
  Filled 2018-12-19 (×4): qty 100

## 2018-12-19 MED ORDER — LACTATED RINGERS IV SOLN
INTRAVENOUS | Status: AC
  Administered 2018-12-19: 13:00:00 via INTRAVENOUS

## 2018-12-19 MED ORDER — INSULIN GLARGINE 100 UNIT/ML ~~LOC~~ SOLN
30.0000 [IU] | Freq: Every day | SUBCUTANEOUS | Status: DC
  Administered 2018-12-19 – 2018-12-21 (×3): 30 [IU] via SUBCUTANEOUS
  Filled 2018-12-19 (×3): qty 0.3

## 2018-12-19 MED ORDER — SODIUM CHLORIDE 0.9 % IV SOLN
200.0000 mg | Freq: Once | INTRAVENOUS | Status: AC
  Administered 2018-12-19: 200 mg via INTRAVENOUS
  Filled 2018-12-19: qty 40

## 2018-12-19 MED ORDER — HYDRALAZINE HCL 50 MG PO TABS
50.0000 mg | ORAL_TABLET | Freq: Three times a day (TID) | ORAL | Status: DC
  Administered 2018-12-19 – 2018-12-20 (×4): 50 mg via ORAL
  Filled 2018-12-19 (×4): qty 1

## 2018-12-19 NOTE — Evaluation (Signed)
Physical Therapy Evaluation Patient Details Name: Sue [name] MRN: [id number] DOB: Apr 27, 1932 Today's Date: 12/19/2018   History of Present Illness  85 year old female, reportedly lives alone but has poor kids who live in town and keep a close eye on her, ambulates with the help of a cane, PMH of DM2, HTN, macular degeneration, HLD, GERD, anxiety/depression, seen in the ER on 11/1:16 week of diarrhea, scratchy throat and nonproductive cough.  Her symptoms reportedly started at her sisters funeral.  She continues to have watery diarrhea with some nausea without vomiting and recently developed dyspnea.  Her son tested positive for Covid before her.  As per family, she had some hypothermia, 94-95 F range 1 day prior to admission.  Admitted for COVID-19 pneumonia with associated diarrhea complicated by dehydration with hyponatremia.  Improved.  Transferred to [facility] hospital  Clinical Impression  The patient  Is mobilizing well, using $ wheeled RW . Patient reports that she plans to return home with family assisting as PTA. Patient ambulated in Room on RA, SPO2 95%. Instructed in IS and flutterr device, will need further  Instruction. Pt admitted with above diagnosis.  Pt currently with functional limitations due to the deficits listed below (see PT Problem List). Pt will benefit from skilled PT to increase their independence and safety with mobility to allow discharge to the venue listed below.       Follow Up Recommendations Home health PT;Supervision/Assistance - 24 hour    Equipment Recommendations  None recommended by PT    Recommendations for Other Services       Precautions / Restrictions Precautions Precautions: Fall      Mobility  Bed Mobility               General bed mobility comments: oob in recliner  Transfers Overall transfer level: Needs assistance Equipment used: 4-wheeled walker Transfers: Sit to/from Stand Sit to Stand: Min guard         General transfer  comment: cues for hand placement, extra effort from lower BSC, cues  to lock/unlock brakes  Ambulation/Gait Ambulation/Gait assistance: Min guard Gait Distance (Feet): 25 Feet(x 2) Assistive device: 4-wheeled walker Gait Pattern/deviations: Step-to pattern;Step-through pattern;Shuffle;Trunk flexed Gait velocity: decr   General Gait Details: gait steady, with Rollator. maneuvred around corners. turned around to back up to toilet and to recliner. Cues to reach back  Stairs            Wheelchair Mobility    Modified Rankin (Stroke Patients Only)       Balance Overall balance assessment: Needs assistance Sitting-balance support: No upper extremity supported;Feet supported Sitting balance-Leahy Scale: Good     Standing balance support: During functional activity;Single extremity supported Standing balance-Leahy Scale: Fair Standing balance comment: for periwash up                             Pertinent Vitals/Pain Pain Assessment: No/denies pain    Home Living Family/patient expects to be discharged to:: Private residence Living Arrangements: Alone;Children Available Help at Discharge: Family;Available 24 hours/day Type of Home: House Home Access: Ramped entrance     Home Layout: One level Home Equipment: Bedside commode;Walker - 4 wheels;Cane - single point;Shower seat      Prior Function Level of Independence: Independent with assistive device(s)         Comments: has niot driven for awhile     Hand Dominance        Extremity/Trunk Assessment  Lower Extremity Assessment Lower Extremity Assessment: Generalized weakness    Cervical / Trunk Assessment Cervical / Trunk Assessment: Kyphotic  Communication   Communication: HOH  Cognition Arousal/Alertness: Awake/alert Behavior During Therapy: WFL for tasks assessed/performed Overall Cognitive Status: Within Functional Limits for tasks assessed                                         General Comments      Exercises Other Exercises Other Exercises: e of IS and flutter device 10's per hour   Assessment/Plan    PT Assessment Patient needs continued PT services  PT Problem List Decreased strength;Decreased mobility;Decreased safety awareness;Decreased knowledge of precautions;Decreased activity tolerance;Decreased balance;Decreased knowledge of use of DME       PT Treatment Interventions DME instruction;Therapeutic activities;Gait training;Therapeutic exercise;Patient/family education;Functional mobility training    PT Goals (Current goals can be found in the Care Plan section)  Acute Rehab PT Goals Patient Stated Goal: To go home thursday PT Goal Formulation: With patient Time For Goal Achievement: 01/02/19 Potential to Achieve Goals: Good    Frequency Min 3X/week   Barriers to discharge        Co-evaluation               AM-PAC PT "6 Clicks" Mobility  Outcome Measure Help needed turning from your back to your side while in a flat bed without using bedrails?: A Little Help needed moving from lying on your back to sitting on the side of a flat bed without using bedrails?: A Little Help needed moving to and from a bed to a chair (including a wheelchair)?: A Little Help needed standing up from a chair using your arms (e.g., wheelchair or bedside chair)?: A Little Help needed to walk in hospital room?: A Little Help needed climbing 3-5 steps with a railing? : A Lot 6 Click Score: 17    End of Session   Activity Tolerance: Patient tolerated treatment well Patient left: in chair;with call bell/phone within reach Nurse Communication: Mobility status PT Visit Diagnosis: Unsteadiness on feet (R26.81);Difficulty in walking, not elsewhere classified (R26.2)    Time: [id number] PT Time Calculation (min) (ACUTE ONLY): 47 min   Charges:   PT Evaluation $PT Eval Low Complexity: 1 Low PT Treatments $Gait Training: 8-22 mins $Self  Care/Home Management: 8-22        [place]  Office [phone]   Claretha Cooper 12/19/2018, 11:37 AM

## 2018-12-19 NOTE — Progress Notes (Signed)
Occupational Therapy Evaluation Patient Details Name: Sue [name] MRN: [id number] DOB: [birth year] Today's Date: 12/19/2018    History of Present Illness 85 year old female, reportedly lives alone but has poor kids who live in town and keep a close eye on her, ambulates with the help of a cane, PMH of DM2, HTN, macular degeneration, HLD, GERD, anxiety/depression, seen in the ER on 11/1:16 week of diarrhea, scratchy throat and nonproductive cough.  Her symptoms reportedly started at her sisters funeral.  She continues to have watery diarrhea with some nausea without vomiting and recently developed dyspnea.  Her son tested positive for Covid before her.  As per family, she had some hypothermia, 94-95 F range 1 day prior to admission.  Admitted for COVID-19 pneumonia with associated diarrhea complicated by dehydration with hyponatremia.  Improved.  Transferred to [facility] hospital   Clinical Impression   PTA pt was living alone, independent in ADLs, majority of IADLs, and mobility tasks. Pt currently requires variable supervision to min assist for self-care and functional transfer tasks. Pt able to ambulate to/from bathroom with FWW and variable supervision to min guard to ensure safety. Pt's O2 SATs dropped to 84% on RA following activity with HR increasing to 144. O2 SATs returned to 96% and HR to 110 follow min seated rest break. DOE 2/4. Pt demonstrates decreased strength, endurance, balance, standing tolerance, and activity tolerance impacting ability to complete self-care and functional transfer tasks. Recommend skilled OT services to address above deficits in order to promote function and prevent further decline. Recommend [place] OT services for continued rehab following hospital discharge.     Follow Up Recommendations  Home health OT;Supervision/Assistance - 24 hour    Equipment Recommendations       Recommendations for Other Services       Precautions / Restrictions Precautions Precautions:  Fall Restrictions Weight Bearing Restrictions: No      Mobility Bed Mobility                  Transfers Overall transfer level: Needs assistance Equipment used: Rolling walker (2 wheeled) Transfers: Sit to/from Stand Sit to Stand: Supervision              Balance Overall balance assessment: Needs assistance   Sitting balance-Leahy Scale: Good       Standing balance-Leahy Scale: Fair                             ADL either performed or assessed with clinical judgement   ADL Overall ADL's : Needs assistance/impaired Eating/Feeding: Independent   Grooming: Supervision/safety;Standing   Upper Body Bathing: Supervision/ safety;Sitting   Lower Body Bathing: Supervison/ safety;Sitting/lateral leans;Sit to/from stand   Upper Body Dressing : Supervision/safety;Sitting   Lower Body Dressing: Sit to/from stand;Sitting/lateral leans;Minimal assistance   Toilet Transfer: Supervision/safety;Ambulation   Toileting- Clothing Manipulation and Hygiene: Supervision/safety;Sit to/from stand       Functional mobility during ADLs: Supervision/safety;Rolling walker       Vision Baseline Vision/History: Wears glasses Wears Glasses: Reading only       Perception     Praxis      Pertinent Vitals/Pain Pain Assessment: No/denies pain     Hand Dominance Right   Extremity/Trunk Assessment Upper Extremity Assessment Upper Extremity Assessment: Generalized weakness   Lower Extremity Assessment Lower Extremity Assessment: Defer to PT evaluation       Communication Communication Communication: HOH   Cognition Arousal/Alertness: Awake/alert Behavior During Therapy: [facility] for tasks  assessed/performed Overall Cognitive Status: Within Functional Limits for tasks assessed                                     General Comments  Educated pt on safety strategies, energy conservation techniques, and activity modifications for self-care and  functional transfer tasks.     Exercises Exercises: Other exercises Other Exercises Other Exercises: Pursed lip breathing x 10 Other Exercises: Flutter valve x 10 with min cues on technique Other Exercises: Incentive spirometer x 10 with min cues on technique. Averaging 1258mL.    Shoulder Instructions      Home Living Family/patient expects to be discharged to:: Private residence Living Arrangements: Alone;Children Available Help at Discharge: Family;Available 24 hours/day Type of Home: House Home Access: Ramped entrance     Home Layout: One level     Bathroom Shower/Tub: Tub/shower unit         Home Equipment: Bedside commode;Walker - 4 wheels;Cane - single point;Shower seat          Prior Functioning/Environment Level of Independence: Independent with assistive device(s)        Comments: Independent in ADLs. Pt reports that children bring her groceries and meals, otherwise she is independent in IADLs. Pt has not driven in awhile.        OT Problem List: Decreased strength;Decreased activity tolerance;Impaired balance (sitting and/or standing);Decreased safety awareness;Cardiopulmonary status limiting activity      OT Treatment/Interventions: Self-care/ADL training;Therapeutic exercise;Neuromuscular education;Energy conservation;DME and/or AE instruction;Therapeutic activities;Patient/family education;Balance training    OT Goals(Current goals can be found in the care plan section) Acute Rehab OT Goals Patient Stated Goal: To go home thursday Time For Goal Achievement: 01/02/19 Potential to Achieve Goals: Good ADL Goals Pt Will Perform Grooming: with modified independence;standing Pt Will Perform Lower Body Dressing: with modified independence;sit to/from stand Pt Will Transfer to Toilet: with modified independence;ambulating Pt Will Perform Toileting - Clothing Manipulation and hygiene: with modified independence;sit to/from stand Additional ADL Goal #1: Pt  will recall and demonstrate breathing exercises with 0 verbal cues.  OT Frequency: Min 3X/week   Barriers to D/C:            Co-evaluation              AM-PAC OT "6 Clicks" Daily Activity     Outcome Measure Help from another person eating meals?: None Help from another person taking care of personal grooming?: A Little Help from another person toileting, which includes using toliet, bedpan, or urinal?: A Little Help from another person bathing (including washing, rinsing, drying)?: A Little Help from another person to put on and taking off regular upper body clothing?: A Little Help from another person to put on and taking off regular lower body clothing?: A Little 6 Click Score: 19   End of Session Equipment Utilized During Treatment: Rolling walker Nurse Communication: Mobility status  Activity Tolerance: Patient limited by fatigue Patient left: in chair;with call bell/phone within reach  OT Visit Diagnosis: Muscle weakness (generalized) (M62.81);Unsteadiness on feet (R26.81)                Time: [id number] OT Time Calculation (min): 37 min Charges:  OT General Charges $OT Visit: 1 Visit OT Evaluation $OT Eval Moderate Complexity: 1 Mod OT Treatments $Self Care/Home Management : 8-22 mins  Mauri Brooklyn OTR/L [phone]   Mauri Brooklyn 12/19/2018, 4:18 PM

## 2018-12-19 NOTE — Progress Notes (Signed)
PROGRESS NOTE                                                                                                                                                                                                             Patient Demographics:    Sue [name], is a 85 y.o. female, DOB - 1932-03-01, DGU:[id number]  Outpatient Primary MD for the patient is Merrilee Seashore, MD    LOS - 2  Admit date - 12/17/2018    Chief Complaint  Patient presents with  . Covid positive/SOB       Brief Narrative  85 year old female, reportedly lives alone but has poor kids who live in town and keep a close eye on her, ambulates with the help of a cane, PMH of DM2, HTN, macular degeneration, HLD, GERD, anxiety/depression. She developed diarrhea and weakness after which her daughter brought her to the ER where she was diagnosed with COVID-19 infection.  She was recently at her sister's funeral where several folks were positive and she was exposed.   Subjective:    Bonnita Nasuti today has, No headache, No chest pain, No abdominal pain - No Nausea, No new weakness tingling or numbness, no Cough - SOB.     Assessment  & Plan :     1. Gastroenteritis,  Acute Covid 19 Viral Pneumonitis during the ongoing 2020 Covid 19 Pandemic - mild Neri disease so far, treated with IV steroids and remdesivir, diarrhea seems to have improved, gentle hydration with IV fluids, PT OT and monitor.  Encouraged her to sit up in chair in the daytime use I-S and flutter valve for pulmonary toiletry and then prone in bed when at night.    SpO2: 96 %  Hepatic Function Latest Ref Rng & Units 12/19/2018 12/18/2018 12/17/2018  Total Protein 6.5 - 8.1 g/dL 6.2(L) 6.2(L) 6.8  Albumin 3.5 - 5.0 g/dL 2.9(L) 2.8(L) 3.2(L)  AST 15 - 41 U/L 30 34 55(H)  ALT 0 - 44 U/L _0 Alk Phosphatase 38 - 126 U/L 98 98 107  Total Bilirubin 0.3 - 1.2 mg/dL 0.4 0.9 0.2(L)     COVID-19 Labs  Recent Labs    12/17/18 1135 12/18/18 0933 12/19/18 0331  DDIMER 1.04* 0.59* 0.46  FERRITIN 227 199  --   LDH 392*  --   --  CRP 7.5* 3.8* 1.5*    Lab Results  Component Value Date   SARSCOV2NAA POSITIVE (A) 12/11/2018      Component Value Date/Time   BNP 75.0 12/19/2018 0331      2.  Dehydration with hyponatremia.  Gentle hydration with IV fluids.  Improved.  3.  Hypomagnesemia.  Replaced and stable.  4.  Essential hypertension.  On Norvasc hydralazine added for better control.  5.  AKI on CKD 3.  Gently hydrate hold ACE inhibitor and HCTZ.  6.  Dyslipidemia.  On statin.  7.  1 out of 4 gram-positive cocci contamination.  Monitor.  8.  Anemia of chronic disease.  Stable.  9.  GERD.  PPI.  10.  DM type II.  Poor outpatient control due to hyperglycemia.  On Levemir and sliding scale.  Monitor, will adjust dose today, oral hypoglycemics held.  Lab Results  Component Value Date   HGBA1C 9.1 (H) 12/17/2018   CBG (last 3)  Recent Labs    12/18/18 1224 12/18/18 1848 12/18/18 2145  GLUCAP 239* 324* 228*     Condition - Fair  Family Communication  : Daughter updated in detail on 12/19/2018  Code Status :  DNR  Diet :    Diet Order            Diet Carb Modified Fluid consistency: Thin; Room service appropriate? Yes  Diet effective now               Disposition Plan  :  Inpt - Med  Consults  :  None  Procedures  :    PUD Prophylaxis : PPI  DVT Prophylaxis  :  Lovenox    Lab Results  Component Value Date   PLT 431 (H) 12/19/2018    Inpatient Medications  Scheduled Meds: . amLODipine  10 mg Oral Daily  . calcium-vitamin D  1 tablet Oral Q lunch  . enoxaparin (LOVENOX) injection  40 mg Subcutaneous Q24H  . insulin aspart  0-15 Units Subcutaneous TID WC  . insulin aspart  0-5 Units Subcutaneous QHS  . insulin aspart  4 Units Subcutaneous TID WC  . insulin detemir  14 Units Subcutaneous BID  . methylPREDNISolone  (SOLU-MEDROL) injection  40 mg Intravenous Daily  . multivitamin with minerals  1 tablet Oral Daily  . nortriptyline  25 mg Oral QHS  . pantoprazole  40 mg Oral Daily  . simvastatin  20 mg Oral Daily  . sodium chloride flush  3 mL Intravenous Q12H  . vitamin B-12  1,000 mcg Oral Q lunch   Continuous Infusions: . sodium chloride    . remdesivir 200 mg in NS 250 mL     Followed by  . [START ON 12/20/2018] remdesivir 100 mg in NS 250 mL     PRN Meds:.sodium chloride, acetaminophen, albuterol, diphenoxylate-atropine, guaiFENesin, ondansetron **OR** ondansetron (ZOFRAN) IV, oxyCODONE, polyvinyl alcohol, sodium chloride flush, zolpidem  Antibiotics  :    Anti-infectives (From admission, onward)   Start     Dose/Rate Route Frequency Ordered Stop   12/20/18 1012  remdesivir 100 mg in sodium chloride 0.9 % 250 mL IVPB     100 mg 500 mL/hr over 30 Minutes Intravenous Every 24 hours 12/19/18 1012 12/24/18 1014   12/19/18 1015  remdesivir 200 mg in sodium chloride 0.9 % 250 mL IVPB     200 mg 500 mL/hr over 30 Minutes Intravenous Once 12/19/18 1012     12/17/18 1700  cefTRIAXone (ROCEPHIN) 1 g in  sodium chloride 0.9 % 100 mL IVPB  Status:  Discontinued     1 g 200 mL/hr over 30 Minutes Intravenous Every 24 hours 12/17/18 1549 12/19/18 0829   12/17/18 1700  azithromycin (ZITHROMAX) 500 mg in sodium chloride 0.9 % 250 mL IVPB  Status:  Discontinued     500 mg 250 mL/hr over 60 Minutes Intravenous Every 24 hours 12/17/18 1549 12/19/18 0829       Time Spent in minutes  30   Lala Lund M.D on 12/19/2018 at 11:29 AM  To page go to [email or web address] - password [place]  [facility] -  Office  [phone]   See all Orders from today for further details   Objective:   Vitals:   12/18/18 2005 12/18/18 2006 12/19/18 0400 12/19/18 0808  BP: (!) 160/86  (!) 157/96 (!) 158/84  Pulse: (!) 101  89 96  Resp: _0 Temp:  97.9 F (36.6 C) 98.1 F (36.7 C) 97.8 F (36.6 C)   TempSrc:  Oral Oral Oral  SpO2: 96%  96% 96%  Weight:      Height:        Wt Readings from Last 3 Encounters:  12/17/18 81.6 kg  03/30/18 81.6 kg  04/15/16 78.2 kg     Intake/Output Summary (Last 24 hours) at 12/19/2018 1129 Last data filed at 12/19/2018 0600 Gross per 24 hour  Intake 490 ml  Output 450 ml  Net 40 ml     Physical Exam  Awake but mildly confused, no new F.N deficits,   [place].AT,PERRAL Supple Neck,No JVD, No cervical lymphadenopathy appriciated.  Symmetrical Chest wall movement, Good air movement bilaterally, CTAB RRR,No Gallops,Rubs or new Murmurs, No Parasternal Heave +ve B.Sounds, Abd Soft, No tenderness, No organomegaly appriciated, No rebound - guarding or rigidity. No Cyanosis, Clubbing or edema, No new Rash or bruise       Data Review:    CBC Recent Labs  Lab 12/17/18 1135 12/18/18 0933 12/19/18 0331  WBC 7.2 4.5 8.4  HGB 11.3* 10.9* 9.8*  HCT 34.5* 33.7* 30.3*  PLT 358 412* 431*  MCV 84.4 86.6 85.4  MCH 27.6 28.0 27.6  MCHC 32.8 32.3 32.3  RDW 13.5 13.8 13.7  LYMPHSABS 0.6* 0.4* 0.5*  MONOABS 0.5 0.2 0.6  EOSABS 0.0 0.0 0.0  BASOSABS 0.0 0.0 0.0    Chemistries  Recent Labs  Lab 12/17/18 1135 12/18/18 0933 12/19/18 0331  NA 124* 130* 133*  K 4.6 4.5 4.6  CL 89* 93* 98  CO2 20* 22 24  GLUCOSE 77 367* 164*  BUN 10 16 30*  CREATININE 1.23* 1.30* 1.34*  CALCIUM 8.2* 8.4* 9.0  MG  --  1.6* 2.5*  AST 55* 34 30  ALT _1 ALKPHOS 107 98 98  BILITOT 0.2* 0.9 0.4   ------------------------------------------------------------------------------------------------------------------ Recent Labs    12/17/18 1135  TRIG 88    Lab Results  Component Value Date   HGBA1C 9.1 (H) 12/17/2018   ------------------------------------------------------------------------------------------------------------------ No results for input(s): TSH, T4TOTAL, T3FREE, THYROIDAB in the last 72 hours.  Invalid input(s): FREET3  Cardiac  Enzymes No results for input(s): CKMB, TROPONINI, MYOGLOBIN in the last 168 hours.  Invalid input(s): CK ------------------------------------------------------------------------------------------------------------------    Component Value Date/Time   BNP 75.0 12/19/2018 0331    Micro Results Recent Results (from the past 240 hour(s))  SARS CORONAVIRUS 2 (TAT 6-24 HRS) Nasopharyngeal Nasopharyngeal Swab     Status: Abnormal   Collection Time: 12/11/18  3:51  PM   Specimen: Nasopharyngeal Swab  Result Value Ref Range Status   SARS Coronavirus 2 POSITIVE (A) NEGATIVE Final    Comment: RESULT CALLED TO, READ BACK BY AND VERIFIED WITH: EMAILED Aretta Nip [id number] 12/12/18 G.MCADOO (NOTE) SARS-CoV-2 target nucleic acids are DETECTED. The SARS-CoV-2 RNA is generally detectable in upper and lower respiratory specimens during the acute phase of infection. Positive results are indicative of active infection with SARS-CoV-2. Clinical  correlation with patient history and other diagnostic information is necessary to determine patient infection status. Positive results do  not rule out bacterial infection or co-infection with other viruses. The expected result is Negative. Fact Sheet for Patients: SugarRoll.be Fact Sheet for Healthcare Providers: https://www.woods-mathews.com/ This test is not yet approved or cleared by the [place] FDA and  has been authorized for detection and/or diagnosis of SARS-CoV-2 by FDA under an Emergency Use Authorization (EUA). This EUA will remain  in effect (meaning this test can be used ) for the duration of the COVID-19 declaration under Section 564(b)(1) of the Act, 21 U.S.C. section 360bbb-3(b)(1), unless the authorization is terminated or revoked sooner. Performed at [facility], [place] [address], [place], [place] [address]   Blood Culture (routine x 2)     Status: None (Preliminary result)   Collection  Time: 12/17/18 11:53 AM   Specimen: BLOOD  Result Value Ref Range Status   Specimen Description BLOOD RIGHT ANTECUBITAL  Final   Special Requests   Final    BOTTLES DRAWN AEROBIC AND ANAEROBIC Blood Culture results may not be optimal due to an inadequate volume of blood received in culture bottles   Culture   Final    NO GROWTH < 24 HOURS Performed at [facility], [place] [address] [place], [place] [address]    Report Status PENDING  Incomplete  Blood Culture (routine x 2)     Status: Abnormal (Preliminary result)   Collection Time: 12/17/18 11:54 AM   Specimen: BLOOD LEFT HAND  Result Value Ref Range Status   Specimen Description BLOOD LEFT HAND  Final   Special Requests   Final    BOTTLES DRAWN AEROBIC AND ANAEROBIC Blood Culture results may not be optimal due to an inadequate volume of blood received in culture bottles   Culture  Setup Time   Final    GRAM POSITIVE COCCI IN CLUSTERS ANAEROBIC BOTTLE ONLY CRITICAL RESULT CALLED TO, READ BACK BY AND VERIFIED WITH: Burke Banner [place] [id number] FCP Performed at [facility], [place] [address] [place], [place] [address]    Culture STAPHYLOCOCCUS SPECIES (COAGULASE NEGATIVE) (A)  Final   Report Status PENDING  Incomplete    Radiology Reports Dg Chest Portable 1 View  Result Date: 12/17/2018 CLINICAL DATA:  Shortness of breath.  COVID-19 EXAM: PORTABLE CHEST 1 VIEW COMPARISON:  Six days ago FINDINGS: Bilateral airspace disease densest in the right upper lobe where there is also some volume loss. Lung volumes are low. Normal heart size for technique. Aortic tortuosity. IMPRESSION: Worsening bilateral pneumonia. Electronically Signed   By: Monte Fantasia M.D.   On: 12/17/2018 09:59   Dg Chest Portable 1 View  Result Date: 12/11/2018 CLINICAL DATA:  Cough EXAM: PORTABLE CHEST 1 VIEW COMPARISON:  Radiograph May 29, 2009 FINDINGS: Lung volumes are low with hazy interstitial and more patchy airspace opacity in the lung bases,  greater than expected for atelectatic change. No pneumothorax or effusion. The aorta is calcified. The remaining cardiomediastinal contours are unremarkable. Degenerative  changes are present in the imaged spine and shoulders. IMPRESSION: Low lung volumes with basilar atelectasis. More patchy opacity could reflect a developing infection or early edema. Electronically Signed   By: Lovena Le M.D.   On: 12/11/2018 15:36

## 2018-12-19 NOTE — Progress Notes (Signed)
2200 medication given early per pt request stating she had been up all day and was ready to go to bed. Vital signs re-evaluated as previous vitals were taken immediately taken after ambulating from [facility].

## 2018-12-19 NOTE — Progress Notes (Signed)
Initial Nutrition Assessment  DOCUMENTATION CODES:   Not applicable  INTERVENTION:   Ensure Enlive po BID, each supplement provides 350 kcal and 20 grams of protein  Pt receiving Hormel Shake daily with Breakfast which provides 520 kcals and 22 g of protein and Magic cup BID with lunch and dinner, each supplement provides 290 kcal and 9 grams of protein, automatically on meal trays to optimize nutritional intake.   Encourage PO intake to meet increased nutrition needs   NUTRITION DIAGNOSIS:   Increased nutrient needs related to acute illness(COVID-19) as evidenced by estimated needs.  GOAL:   Patient will meet greater than or equal to 90% of their needs  MONITOR:   PO intake, Supplement acceptance  REASON FOR ASSESSMENT:   Malnutrition Screening Tool    ASSESSMENT:   Pt who lives alone with PMH of DM, HTN, macular degeneration, HLD, GERD, anxiety/depression admitted with diarrhea and dx with COVID-19 after attending her sister's funeral.   Pt with gastroenteritis and acute COVID-19 viral pneumonitis.  Pt on room air.   Medications reviewed and include: oscal with D, 4 units novolog TID, 30 units lantus daily, solu-medrol, MVI, vitamin B12, remdesivir  Labs reviewed CBG's: [phone]   NUTRITION - FOCUSED PHYSICAL EXAM:  Deferred; RD working remotely   Diet Order:   Diet Order            Diet Carb Modified Fluid consistency: Thin; Room service appropriate? Yes  Diet effective now              EDUCATION NEEDS:   No education needs have been identified at this time  Skin:  Skin Assessment: Reviewed RN Assessment  Last BM:  11/21  Height:   Ht Readings from Last 1 Encounters:  12/17/18 5\' 6"  (1.676 m)    Weight:   Wt Readings from Last 1 Encounters:  12/17/18 81.6 kg    Ideal Body Weight:  59 kg  BMI:  Body mass index is 29.04 kg/m.  Estimated Nutritional Needs:   Kcal:  1800-2000  Protein:  85-110 grams  Fluid:  > 1.8  L/day  Maylon Peppers RD, LDN, CNSC [phone] Pager [phone] After Hours Pager

## 2018-12-20 DIAGNOSIS — E118 Type 2 diabetes mellitus with unspecified complications: Secondary | ICD-10-CM

## 2018-12-20 DIAGNOSIS — E785 Hyperlipidemia, unspecified: Secondary | ICD-10-CM | POA: Diagnosis present

## 2018-12-20 DIAGNOSIS — E1165 Type 2 diabetes mellitus with hyperglycemia: Secondary | ICD-10-CM | POA: Diagnosis present

## 2018-12-20 DIAGNOSIS — F32A Depression, unspecified: Secondary | ICD-10-CM | POA: Diagnosis present

## 2018-12-20 DIAGNOSIS — F320 Major depressive disorder, single episode, mild: Secondary | ICD-10-CM

## 2018-12-20 DIAGNOSIS — E7800 Pure hypercholesterolemia, unspecified: Secondary | ICD-10-CM

## 2018-12-20 DIAGNOSIS — F329 Major depressive disorder, single episode, unspecified: Secondary | ICD-10-CM | POA: Diagnosis present

## 2018-12-20 DIAGNOSIS — F419 Anxiety disorder, unspecified: Secondary | ICD-10-CM | POA: Diagnosis present

## 2018-12-20 DIAGNOSIS — IMO0002 Reserved for concepts with insufficient information to code with codable children: Secondary | ICD-10-CM | POA: Diagnosis present

## 2018-12-20 DIAGNOSIS — I10 Essential (primary) hypertension: Secondary | ICD-10-CM

## 2018-12-20 LAB — COMPREHENSIVE METABOLIC PANEL
ALT: 25 U/L (ref 0–44)
AST: 32 U/L (ref 15–41)
Albumin: 3.1 g/dL — ABNORMAL LOW (ref 3.5–5.0)
Alkaline Phosphatase: 87 U/L (ref 38–126)
Anion gap: 11 (ref 5–15)
BUN: 42 mg/dL — ABNORMAL HIGH (ref 8–23)
CO2: 23 mmol/L (ref 22–32)
Calcium: 9 mg/dL (ref 8.9–10.3)
Chloride: 97 mmol/L — ABNORMAL LOW (ref 98–111)
Creatinine, Ser: 1.39 mg/dL — ABNORMAL HIGH (ref 0.44–1.00)
GFR calc Af Amer: 40 mL/min — ABNORMAL LOW (ref >60)
GFR calc non Af Amer: 35 mL/min — ABNORMAL LOW (ref >60)
Glucose, Bld: 124 mg/dL — ABNORMAL HIGH (ref 70–99)
Potassium: 4.5 mmol/L (ref 3.5–5.1)
Sodium: 131 mmol/L — ABNORMAL LOW (ref 135–145)
Total Bilirubin: 0.5 mg/dL (ref 0.3–1.2)
Total Protein: 6.2 g/dL — ABNORMAL LOW (ref 6.5–8.1)

## 2018-12-20 LAB — URINE CULTURE: Culture: <10000 — AB

## 2018-12-20 LAB — MAGNESIUM: Magnesium: 2 mg/dL (ref 1.7–2.4)

## 2018-12-20 LAB — D-DIMER, QUANTITATIVE: D-Dimer, Quant: 0.61 ug/mL-FEU — ABNORMAL HIGH (ref 0.00–0.50)

## 2018-12-20 LAB — GLUCOSE, CAPILLARY
Glucose-Capillary: 73 mg/dL (ref 70–99)
Glucose-Capillary: 210 mg/dL — ABNORMAL HIGH (ref 70–99)
Glucose-Capillary: 335 mg/dL — ABNORMAL HIGH (ref 70–99)
Glucose-Capillary: 368 mg/dL — ABNORMAL HIGH (ref 70–99)

## 2018-12-20 LAB — BRAIN NATRIURETIC PEPTIDE: B Natriuretic Peptide: 78 pg/mL (ref 0.0–100.0)

## 2018-12-20 LAB — CULTURE, BLOOD (ROUTINE X 2)

## 2018-12-20 LAB — C-REACTIVE PROTEIN: CRP: 1.3 mg/dL — ABNORMAL HIGH (ref <1.0)

## 2018-12-20 MED ORDER — HYDRALAZINE HCL 50 MG PO TABS
75.0000 mg | ORAL_TABLET | Freq: Three times a day (TID) | ORAL | Status: DC
  Administered 2018-12-20 – 2018-12-23 (×9): 75 mg via ORAL
  Filled 2018-12-20 (×9): qty 2

## 2018-12-20 MED ORDER — PHENOL 1.4 % MT LIQD
2.0000 | OROMUCOSAL | Status: DC | PRN
  Administered 2018-12-20 – 2018-12-23 (×2): 2 via OROMUCOSAL
  Filled 2018-12-20: qty 177

## 2018-12-20 MED ORDER — INSULIN ASPART 100 UNIT/ML ~~LOC~~ SOLN
8.0000 [IU] | Freq: Three times a day (TID) | SUBCUTANEOUS | Status: DC
  Administered 2018-12-20 – 2018-12-22 (×6): 8 [IU] via SUBCUTANEOUS

## 2018-12-20 NOTE — Progress Notes (Signed)
PROGRESS NOTE    Sue [name]  [id number] DOB: Sep 15, 1932 DOA: 12/17/2018 PCP: Merrilee Seashore, MD   Brief Narrative:  85 year old WF   reportedly lives alone but has four kids who live in town and keep a close eye on her, ambulates with the help of a cane, PMHx diabetes type 2 uncontrolled with complication, HTN, macular degeneration, HLD, GERD, anxiety/depression.   She developed diarrhea and weakness after which her daughter brought her to the ER where she was diagnosed with COVID-19 infection.  She was recently at her sister's funeral where several folks were positive and she was exposed.   Subjective: Last 24 hours afebrile, A/O x4, negative CP, negative N/V, negative S OB.  Has not been ambulating.   Assessment & Plan:   Principal Problem:   Acute respiratory disease due to COVID-19 virus Active Problems:   Dehydration   Essential hypertension   Diabetes mellitus type 2, uncontrolled, with complications (HCC)   HLD (hyperlipidemia)   Anxiety   Depression  Covid pneumonia/acute respiratory failure with hypoxia COVID-19 Labs  Recent Labs    12/18/18 0933 12/19/18 0331 12/20/18 0210  DDIMER 0.59* 0.46 0.61*  FERRITIN 199  --   --   CRP 3.8* 1.5* 1.3*    Lab Results  Component Value Date   SARSCOV2NAA POSITIVE (A) 12/11/2018  -Remdesivir per pharmacy protocol -Solu-Medrol x10 days  -11/25 ambulatory SPO2 pending -PT/OT recommend home health with 24-hour supervision.  Per patient has children who look in on her, who are also ill. -11/25 social work consult;PT/OT recommend home health with 24-hour supervision.  Per patient she does not have 24-hour supervision has children that look in on her and help her out but who are currently also ill.  Healthy sure safe discharge.  SNF?  Covid gastroenteritis -Currently patient with negative N/V.  Eating.  Gastroenteritis appears to resolved  Dehydration -Appears resolved  Essential HTN -Amlodipine 10 mg daily  -11/25 increase to 75 mg TID -Hold ACE inhibitor and HCTZ  Acute on CKD stage III (baseline 1.23? ) -See HTN Recent Labs  Lab 12/17/18 1135 12/18/18 0933 12/19/18 0331 12/20/18 0210  CREATININE 1.23* 1.30* 1.34* 1.39*    Diabetes type 2 uncontrolled with complication -[id number] hemoglobin A1c= 9.1 -Lantus 30 units daily -11/25 increase NovoLog 8 units qac -Moderate SSI  HLD -Lipid panel pending -Simvastatin 20 mg daily  Anemia of chronic disease -Stable  Anxiety/depression -Stable     DVT prophylaxis: Lovenox Code Status: DNR Family Communication:  Disposition Plan: TBD   Consultants:    Procedures/Significant Events:     I have personally reviewed and interpreted all radiology studies and my findings are as above.  VENTILATOR SETTINGS:    Cultures   Antimicrobials: Anti-infectives (From admission, onward)   Start     Stop   12/20/18 1000  remdesivir 100 mg in sodium chloride 0.9 % 250 mL IVPB     12/24/18 0959   12/19/18 1015  remdesivir 200 mg in sodium chloride 0.9 % 250 mL IVPB     12/19/18 1407   12/17/18 1700  cefTRIAXone (ROCEPHIN) 1 g in sodium chloride 0.9 % 100 mL IVPB  Status:  Discontinued     12/19/18 0829   12/17/18 1700  azithromycin (ZITHROMAX) 500 mg in sodium chloride 0.9 % 250 mL IVPB  Status:  Discontinued     12/19/18 0829       Devices    LINES / TUBES:      Continuous Infusions: . sodium  chloride    . remdesivir 100 mg in NS 250 mL 100 mg (12/20/18 0915)     Objective: Vitals:   12/19/18 2000 12/20/18 0005 12/20/18 0446 12/20/18 0752  BP:  (!) 156/85 (!) 177/98 (!) 150/66  Pulse: (!) 106 99 98 96  Resp: 16 17 16 16   Temp:  97.8 F (36.6 C) 98.2 F (36.8 C) 98.1 F (36.7 C)  TempSrc:  Oral Oral Oral  SpO2: 98% 96% 97% 98%  Weight:      Height:        Intake/Output Summary (Last 24 hours) at 12/20/2018 1506 Last data filed at 12/20/2018 0745 Gross per 24 hour  Intake 180 ml  Output 1800 ml  Net  -1620 ml   Filed Weights   12/17/18 1438  Weight: 81.6 kg    Examination:  General: A/O x4, no acute respiratory distress Eyes: negative scleral hemorrhage, negative anisocoria, negative icterus ENT: Negative Runny nose, negative gingival bleeding, Neck:  Negative scars, masses, torticollis, lymphadenopathy, JVD Lungs: Clear to auscultation bilaterally without wheezes or crackles Cardiovascular: Tachycardic, without murmur gallop or rub normal S1 and S2 Abdomen: negative abdominal pain, nondistended, positive soft, bowel sounds, no rebound, no ascites, no appreciable mass Extremities: No significant cyanosis, clubbing, or edema bilateral lower extremities Skin: Negative rashes, lesions, ulcers Psychiatric:  Negative depression, negative anxiety, negative fatigue, negative mania  Central nervous system:  Cranial nerves II through XII intact, tongue/uvula midline, all extremities muscle strength 5/5, sensation intact throughout,  negative dysarthria, negative expressive aphasia, negative receptive aphasia.  .     Data Reviewed: Care during the described time interval was provided by me .  I have reviewed this patient's available data, including medical history, events of note, physical examination, and all test results as part of my evaluation.   CBC: Recent Labs  Lab 12/17/18 1135 12/18/18 0933 12/19/18 0331 12/20/18 0210  WBC 7.2 4.5 8.4 11.7*  NEUTROABS 5.9 3.9 7.1 8.8*  HGB 11.3* 10.9* 9.8* 9.9*  HCT 34.5* 33.7* 30.3* 30.2*  MCV 84.4 86.6 85.4 85.3  PLT 358 412* 431* [id number]*   Basic Metabolic Panel: Recent Labs  Lab 12/17/18 1135 12/18/18 0933 12/19/18 0331 12/20/18 0210  NA 124* 130* 133* 131*  K 4.6 4.5 4.6 4.5  CL 89* 93* 98 97*  CO2 20* 22 24 23   GLUCOSE 77 367* 164* 124*  BUN 10 16 30* 42*  CREATININE 1.23* 1.30* 1.34* 1.39*  CALCIUM 8.2* 8.4* 9.0 9.0  MG  --  1.6* 2.5* 2.0  PHOS  --  3.4  --   --    GFR: Estimated Creatinine Clearance: 32.4 mL/min (A)  (by C-G formula based on SCr of 1.39 mg/dL (H)). Liver Function Tests: Recent Labs  Lab 12/17/18 1135 12/18/18 0933 12/19/18 0331 12/20/18 0210  AST 55* 34 30 32  ALT 26 25 23 25   ALKPHOS 107 98 98 87  BILITOT 0.2* 0.9 0.4 0.5  PROT 6.8 6.2* 6.2* 6.2*  ALBUMIN 3.2* 2.8* 2.9* 3.1*   No results for input(s): LIPASE, AMYLASE in the last 168 hours. No results for input(s): AMMONIA in the last 168 hours. Coagulation Profile: No results for input(s): INR, PROTIME in the last 168 hours. Cardiac Enzymes: No results for input(s): CKTOTAL, CKMB, CKMBINDEX, TROPONINI in the last 168 hours. BNP (last 3 results) No results for input(s): PROBNP in the last 8760 hours. HbA1C: No results for input(s): HGBA1C in the last 72 hours. CBG: Recent Labs  Lab 12/19/18 1640 12/19/18  1949 12/19/18 2158 12/20/18 0747 12/20/18 1103  GLUCAP 368* 396* 237* 73 210*   Lipid Profile: No results for input(s): CHOL, HDL, LDLCALC, TRIG, CHOLHDL, LDLDIRECT in the last 72 hours. Thyroid Function Tests: No results for input(s): TSH, T4TOTAL, FREET4, T3FREE, THYROIDAB in the last 72 hours. Anemia Panel: Recent Labs    12/18/18 0933  FERRITIN 199   Urine analysis:    Component Value Date/Time   COLORURINE STRAW (A) 12/19/2018 1330   APPEARANCEUR CLEAR 12/19/2018 1330   LABSPEC 1.006 12/19/2018 1330   PHURINE 7.0 12/19/2018 1330   GLUCOSEU 50 (A) 12/19/2018 1330   HGBUR NEGATIVE 12/19/2018 1330   BILIRUBINUR NEGATIVE 12/19/2018 1330   KETONESUR NEGATIVE 12/19/2018 1330   PROTEINUR NEGATIVE 12/19/2018 1330   NITRITE NEGATIVE 12/19/2018 1330   LEUKOCYTESUR NEGATIVE 12/19/2018 1330   Sepsis Labs: @LABRCNTIP (procalcitonin:4,lacticidven:4)  ) Recent Results (from the past 240 hour(s))  SARS CORONAVIRUS 2 (TAT 6-24 HRS) Nasopharyngeal Nasopharyngeal Swab     Status: Abnormal   Collection Time: 12/11/18  3:51 PM   Specimen: Nasopharyngeal Swab  Result Value Ref Range Status   SARS Coronavirus 2  POSITIVE (A) NEGATIVE Final    Comment: RESULT CALLED TO, READ BACK BY AND VERIFIED WITH: EMAILED Aretta Nip 0154 12/12/18 G.MCADOO (NOTE) SARS-CoV-2 target nucleic acids are DETECTED. The SARS-CoV-2 RNA is generally detectable in upper and lower respiratory specimens during the acute phase of infection. Positive results are indicative of active infection with SARS-CoV-2. Clinical  correlation with patient history and other diagnostic information is necessary to determine patient infection status. Positive results do  not rule out bacterial infection or co-infection with other viruses. The expected result is Negative. Fact Sheet for Patients: SugarRoll.be Fact Sheet for Healthcare Providers: https://www.Leif Loflin-mathews.com/ This test is not yet approved or cleared by the [place] FDA and  has been authorized for detection and/or diagnosis of SARS-CoV-2 by FDA under an Emergency Use Authorization (EUA). This EUA will remain  in effect (meaning this test can be used ) for the duration of the COVID-19 declaration under Section 564(b)(1) of the Act, 21 U.S.C. section 360bbb-3(b)(1), unless the authorization is terminated or revoked sooner. Performed at [facility], [place] [address] [place], [place] [address]   Blood Culture (routine x 2)     Status: None (Preliminary result)   Collection Time: 12/17/18 11:53 AM   Specimen: BLOOD  Result Value Ref Range Status   Specimen Description BLOOD RIGHT ANTECUBITAL  Final   Special Requests   Final    BOTTLES DRAWN AEROBIC AND ANAEROBIC Blood Culture results may not be optimal due to an inadequate volume of blood received in culture bottles   Culture   Final    NO GROWTH 3 DAYS Performed at [facility], [place] [address] [place], [place] [address]    Report Status PENDING  Incomplete  Blood Culture (routine x 2)     Status: Abnormal   Collection Time: 12/17/18 11:54 AM   Specimen:  BLOOD LEFT HAND  Result Value Ref Range Status   Specimen Description BLOOD LEFT HAND  Final   Special Requests   Final    BOTTLES DRAWN AEROBIC AND ANAEROBIC Blood Culture results may not be optimal due to an inadequate volume of blood received in culture bottles   Culture  Setup Time   Final    GRAM POSITIVE COCCI IN CLUSTERS ANAEROBIC BOTTLE ONLY CRITICAL RESULT CALLED TO, READ BACK BY AND VERIFIED WITH: [place] [id number]  FCP    Culture (A)  Final    STAPHYLOCOCCUS SPECIES (COAGULASE NEGATIVE) THE SIGNIFICANCE OF ISOLATING THIS ORGANISM FROM A SINGLE SET OF BLOOD CULTURES WHEN MULTIPLE SETS ARE DRAWN IS UNCERTAIN. PLEASE NOTIFY THE MICROBIOLOGY DEPARTMENT WITHIN ONE WEEK IF SPECIATION AND SENSITIVITIES ARE REQUIRED. Performed at [facility], [place] [address] [place], [place] [address]    Report Status 12/20/2018 FINAL  Final  Culture, Urine     Status: Abnormal   Collection Time: 12/19/18  1:30 PM   Specimen: Urine, Clean Catch  Result Value Ref Range Status   Specimen Description   Final    URINE, CLEAN CATCH Performed at [facility], [place] [address], [place], [place] [address]    Special Requests   Final    NONE Performed at [facility], [place] [address], [place], [place] [address]    Culture (A)  Final    <10,000 COLONIES/mL INSIGNIFICANT GROWTH Performed at [place] [address], [place], [place] [address]    Report Status 12/20/2018 FINAL  Final         Radiology Studies: No results found.      Scheduled Meds: . amLODipine  10 mg Oral Daily  . calcium-vitamin D  1 tablet Oral Q lunch  . enoxaparin (LOVENOX) injection  40 mg Subcutaneous Q24H  . feeding supplement (ENSURE ENLIVE)  237 mL Oral BID BM  . hydrALAZINE  75 mg Oral Q8H  . insulin aspart  0-15 Units Subcutaneous TID WC  . insulin aspart  0-5 Units Subcutaneous QHS  . insulin aspart  8 Units Subcutaneous TID WC  . insulin  glargine  30 Units Subcutaneous Daily  . methylPREDNISolone (SOLU-MEDROL) injection  40 mg Intravenous Daily  . multivitamin with minerals  1 tablet Oral Daily  . nortriptyline  25 mg Oral QHS  . pantoprazole  40 mg Oral Daily  . simvastatin  20 mg Oral Daily  . sodium chloride flush  3 mL Intravenous Q12H  . vitamin B-12  1,000 mcg Oral Q lunch   Continuous Infusions: . sodium chloride    . remdesivir 100 mg in NS 250 mL 100 mg (12/20/18 0915)     LOS: 3 days   The patient is critically ill with multiple organ systems failure and requires high complexity decision making for assessment and support, frequent evaluation and titration of therapies, application of advanced monitoring technologies and extensive interpretation of multiple databases. Critical Care Time devoted to patient care services described in this note  Time spent: 40 minutes     Brailynn Breth, Geraldo Docker, MD [facility] Pager [phone]  If 7PM-7AM, please contact night-coverage [email or web address] Password [email or web address] 12/20/2018, 3:06 PM

## 2018-12-20 NOTE — Progress Notes (Signed)
Pt. Received at room 305. Stable at this time. Orders reviewed. Will continuo to monitor

## 2018-12-20 NOTE — Progress Notes (Signed)
Patient transferred to room 309 by wheelchair.  Report given to Maudie Mercury, RN on 3rd floor. Patient's daughter, Jerral Bonito, notified of transfer and given patient's room telephone number.  Update given on patient status and plan of care.  All questions welcomed and answered.  Kim expressed lots of gratitude for the staff at [facility] and the care that has been taken of her mother.

## 2018-12-20 NOTE — Progress Notes (Signed)
RN spoke on the phone with pt's 2 daughters. No further concerns

## 2018-12-21 DIAGNOSIS — N179 Acute kidney failure, unspecified: Secondary | ICD-10-CM | POA: Diagnosis present

## 2018-12-21 DIAGNOSIS — A0839 Other viral enteritis: Secondary | ICD-10-CM

## 2018-12-21 LAB — CBC WITH DIFFERENTIAL/PLATELET
Abs Immature Granulocytes: 1.07 10*3/uL — ABNORMAL HIGH (ref 0.00–0.07)
Basophils Absolute: 0.1 10*3/uL (ref 0.0–0.1)
Basophils Relative: 1 %
Eosinophils Absolute: 0 10*3/uL (ref 0.0–0.5)
Eosinophils Relative: 0 %
HCT: 32.5 % — ABNORMAL LOW (ref 36.0–46.0)
Hemoglobin: 10.7 g/dL — ABNORMAL LOW (ref 12.0–15.0)
Immature Granulocytes: 9 %
Lymphocytes Relative: 11 %
Lymphs Abs: 1.3 10*3/uL (ref 0.7–4.0)
MCH: 28.2 pg (ref 26.0–34.0)
MCHC: 32.9 g/dL (ref 30.0–36.0)
MCV: 85.8 fL (ref 80.0–100.0)
Monocytes Absolute: 1.7 10*3/uL — ABNORMAL HIGH (ref 0.1–1.0)
Monocytes Relative: 14 %
Neutro Abs: 7.6 10*3/uL (ref 1.7–7.7)
Neutrophils Relative %: 65 %
Platelets: 504 10*3/uL — ABNORMAL HIGH (ref 150–400)
RBC: 3.79 MIL/uL — ABNORMAL LOW (ref 3.87–5.11)
RDW: 13.7 % (ref 11.5–15.5)
WBC: 11.8 10*3/uL — ABNORMAL HIGH (ref 4.0–10.5)
nRBC: 0 % (ref 0.0–0.2)

## 2018-12-21 LAB — COMPREHENSIVE METABOLIC PANEL
ALT: 28 U/L (ref 0–44)
AST: 28 U/L (ref 15–41)
Albumin: 3.1 g/dL — ABNORMAL LOW (ref 3.5–5.0)
Alkaline Phosphatase: 92 U/L (ref 38–126)
Anion gap: 13 (ref 5–15)
BUN: 46 mg/dL — ABNORMAL HIGH (ref 8–23)
CO2: 23 mmol/L (ref 22–32)
Calcium: 9.1 mg/dL (ref 8.9–10.3)
Chloride: 97 mmol/L — ABNORMAL LOW (ref 98–111)
Creatinine, Ser: 1.42 mg/dL — ABNORMAL HIGH (ref 0.44–1.00)
GFR calc Af Amer: 39 mL/min — ABNORMAL LOW (ref >60)
GFR calc non Af Amer: 34 mL/min — ABNORMAL LOW (ref >60)
Glucose, Bld: 132 mg/dL — ABNORMAL HIGH (ref 70–99)
Potassium: 4.3 mmol/L (ref 3.5–5.1)
Sodium: 133 mmol/L — ABNORMAL LOW (ref 135–145)
Total Bilirubin: 0.4 mg/dL (ref 0.3–1.2)
Total Protein: 6.1 g/dL — ABNORMAL LOW (ref 6.5–8.1)

## 2018-12-21 LAB — GLUCOSE, CAPILLARY
Glucose-Capillary: 92 mg/dL (ref 70–99)
Glucose-Capillary: 346 mg/dL — ABNORMAL HIGH (ref 70–99)
Glucose-Capillary: 377 mg/dL — ABNORMAL HIGH (ref 70–99)
Glucose-Capillary: 299 mg/dL — ABNORMAL HIGH (ref 70–99)

## 2018-12-21 LAB — LIPID PANEL
Cholesterol: 109 mg/dL (ref 0–200)
HDL: 54 mg/dL (ref >40)
LDL Cholesterol: 18 mg/dL (ref 0–99)
Total CHOL/HDL Ratio: 2 RATIO
Triglycerides: 185 mg/dL — ABNORMAL HIGH (ref <150)
VLDL: 37 mg/dL (ref 0–40)

## 2018-12-21 LAB — C-REACTIVE PROTEIN: CRP: 1.6 mg/dL — ABNORMAL HIGH (ref <1.0)

## 2018-12-21 LAB — MAGNESIUM: Magnesium: 1.9 mg/dL (ref 1.7–2.4)

## 2018-12-21 LAB — BRAIN NATRIURETIC PEPTIDE: B Natriuretic Peptide: 69.4 pg/mL (ref 0.0–100.0)

## 2018-12-21 LAB — D-DIMER, QUANTITATIVE: D-Dimer, Quant: 0.36 ug/mL-FEU (ref 0.00–0.50)

## 2018-12-21 MED ORDER — INSULIN GLARGINE 100 UNIT/ML ~~LOC~~ SOLN
35.0000 [IU] | Freq: Every day | SUBCUTANEOUS | Status: DC
  Administered 2018-12-22 – 2018-12-23 (×2): 35 [IU] via SUBCUTANEOUS
  Filled 2018-12-21 (×2): qty 0.35

## 2018-12-21 MED ORDER — SODIUM CHLORIDE 0.9 % IV SOLN
INTRAVENOUS | Status: DC
  Administered 2018-12-21 – 2018-12-22 (×2): via INTRAVENOUS

## 2018-12-21 NOTE — Progress Notes (Signed)
  SATURATION QUALIFICATIONS: (This note is used to comply with regulatory documentation for home oxygen)  Patient Saturations on Room Air at Rest = 94%  Patient Saturations on Room Air while Ambulating = 87%  Patient Saturations on 2Liters of oxygen while Ambulating = 91%  Please briefly explain why patient needs home oxygen: sob with ambulating

## 2018-12-21 NOTE — Progress Notes (Addendum)
PROGRESS NOTE    Sue [name]  [id number] DOB: Apr 13, 1932 DOA: 12/17/2018 PCP: Merrilee Seashore, MD   Brief Narrative:  85 year old WF   reportedly lives alone but has four kids who live in town and keep a close eye on her, ambulates with the help of a cane, PMHx diabetes type 2 uncontrolled with complication, HTN, macular degeneration, HLD, GERD, anxiety/depression.   She developed diarrhea and weakness after which her daughter brought her to the ER where she was diagnosed with COVID-19 infection.  She was recently at her sister's funeral where several folks were positive and she was exposed.   Subjective: 11/26  A/O x4, negative CP, negative N/V, negative S OB,     Assessment & Plan:   Principal Problem:   Acute respiratory disease due to COVID-19 virus Active Problems:   Dehydration   Essential hypertension   Diabetes mellitus type 2, uncontrolled, with complications (HCC)   HLD (hyperlipidemia)   Anxiety   Depression  Covid pneumonia/acute respiratory failure with hypoxia COVID-19 Labs  Recent Labs    12/18/18 0933 12/19/18 0331 12/20/18 0210 12/21/18 0215  DDIMER 0.59* 0.46 0.61* 0.36  FERRITIN 199  --   --   --   CRP 3.8* 1.5* 1.3* 1.6*    Lab Results  Component Value Date   SARSCOV2NAA POSITIVE (A) 12/11/2018  -Remdesivir per pharmacy protocol -Solu-Medrol x10 days  -11/25 ambulatory SPO2 pending -PT/OT recommend home health with 24-hour supervision.   -11/26 spoke with patient again concerning her discharge.  Per patient 2 of her daughters will be rotating taking care of her when she is discharged.  Her ill son will not visit.  Covid gastroenteritis -Currently patient with negative N/V.  Eating.  Gastroenteritis appears to resolved  Dehydration -Appears resolved  Essential HTN -Amlodipine 10 mg daily -11/25 hydralazine increase to 75 mg TID -Hold ACE inhibitor and HCTZ  Acute on CKD stage III (baseline 1.23? ) -See HTN Recent Labs   Lab 12/17/18 1135 12/18/18 0933 12/19/18 0331 12/20/18 0210 12/21/18 0215  CREATININE 1.23* 1.30* 1.34* 1.39* 1.42*  -Strict in and out -Daily weight -11/26 normal saline 65ml/hr  Diabetes type 2 uncontrolled with complication -[id number] hemoglobin A1c= 9.1 -11/25 increase NovoLog 8 units qac -11/26 increase Lantus 35 units daily -Moderate SSI  HLD -11/26 LDL = 18  -Simvastatin 20 mg daily  Anemia of chronic disease Recent Labs  Lab 12/17/18 1135 12/18/18 0933 12/19/18 0331 12/20/18 0210 12/21/18 0215  HGB 11.3* 10.9* 9.8* 9.9* 10.7*  -Stable  Anxiety/depression -Stable     DVT prophylaxis: Lovenox Code Status: DNR Family Communication: 11/26 spoke with Jan (Daughter) explained plan of care and answered all questions  Disposition Plan: TBD   Consultants:    Procedures/Significant Events:     I have personally reviewed and interpreted all radiology studies and my findings are as above.  VENTILATOR SETTINGS:    Cultures   Antimicrobials: Anti-infectives (From admission, onward)   Start     Stop   12/20/18 1000  remdesivir 100 mg in sodium chloride 0.9 % 250 mL IVPB     12/24/18 0959   12/19/18 1015  remdesivir 200 mg in sodium chloride 0.9 % 250 mL IVPB     12/19/18 1407   12/17/18 1700  cefTRIAXone (ROCEPHIN) 1 g in sodium chloride 0.9 % 100 mL IVPB  Status:  Discontinued     12/19/18 0829   12/17/18 1700  azithromycin (ZITHROMAX) 500 mg in sodium chloride 0.9 % 250 mL IVPB  Status:  Discontinued     12/19/18 0829       Devices    LINES / TUBES:      Continuous Infusions: . sodium chloride    . remdesivir 100 mg in NS 250 mL 100 mg (12/21/18 0843)     Objective: Vitals:   12/20/18 1600 12/20/18 1959 12/21/18 0400 12/21/18 0802  BP: (!) 147/84 (!) 156/70 (!) 164/84 (!) 145/79  Pulse: (!) 102 (!) 105 98 97  Resp: 18 18 18 20   Temp: 97.8 F (36.6 C) 97.8 F (36.6 C) 97.9 F (36.6 C) 98.4 F (36.9 C)  TempSrc:  Oral Oral Oral   SpO2: 99% 95% 98% 98%  Weight:      Height:       No intake or output data in the 24 hours ending 12/21/18 0907 Filed Weights   12/17/18 1438  Weight: 81.6 kg   Physical Exam:  General: A/O x4, no acute respiratory distress Eyes: negative scleral hemorrhage, negative anisocoria, negative icterus ENT: Negative Runny nose, negative gingival bleeding, Neck:  Negative scars, masses, torticollis, lymphadenopathy, JVD Lungs: Clear to auscultation bilaterally without wheezes or crackles Cardiovascular: Regular rate and rhythm without murmur gallop or rub normal S1 and S2 Abdomen: negative abdominal pain, nondistended, positive soft, bowel sounds, no rebound, no ascites, no appreciable mass Extremities: No significant cyanosis, clubbing, or edema bilateral lower extremities Skin: Negative rashes, lesions, ulcers Psychiatric:  Negative depression, negative anxiety, negative fatigue, negative mania  Central nervous system:  Cranial nerves II through XII intact, tongue/uvula midline, all extremities muscle strength 5/5, sensation intact throughout,  negative dysarthria, negative expressive aphasia, negative receptive aphasia.  .     Data Reviewed: Care during the described time interval was provided by me .  I have reviewed this patient's available data, including medical history, events of note, physical examination, and all test results as part of my evaluation.   CBC: Recent Labs  Lab 12/17/18 1135 12/18/18 0933 12/19/18 0331 12/20/18 0210 12/21/18 0215  WBC 7.2 4.5 8.4 11.7* 11.8*  NEUTROABS 5.9 3.9 7.1 8.8* 7.6  HGB 11.3* 10.9* 9.8* 9.9* 10.7*  HCT 34.5* 33.7* 30.3* 30.2* 32.5*  MCV 84.4 86.6 85.4 85.3 85.8  PLT 358 412* 431* 474* [id number]*   Basic Metabolic Panel: Recent Labs  Lab 12/17/18 1135 12/18/18 0933 12/19/18 0331 12/20/18 0210 12/21/18 0215  NA 124* 130* 133* 131* 133*  K 4.6 4.5 4.6 4.5 4.3  CL 89* 93* 98 97* 97*  CO2 20* 22 24 23 23   GLUCOSE 77 367* 164*  124* 132*  BUN 10 16 30* 42* 46*  CREATININE 1.23* 1.30* 1.34* 1.39* 1.42*  CALCIUM 8.2* 8.4* 9.0 9.0 9.1  MG  --  1.6* 2.5* 2.0 1.9  PHOS  --  3.4  --   --   --    GFR: Estimated Creatinine Clearance: 31.8 mL/min (A) (by C-G formula based on SCr of 1.42 mg/dL (H)). Liver Function Tests: Recent Labs  Lab 12/17/18 1135 12/18/18 0933 12/19/18 0331 12/20/18 0210 12/21/18 0215  AST 55* 34 30 32 28  ALT 26 25 23 25 28   ALKPHOS 107 98 98 87 92  BILITOT 0.2* 0.9 0.4 0.5 0.4  PROT 6.8 6.2* 6.2* 6.2* 6.1*  ALBUMIN 3.2* 2.8* 2.9* 3.1* 3.1*   No results for input(s): LIPASE, AMYLASE in the last 168 hours. No results for input(s): AMMONIA in the last 168 hours. Coagulation Profile: No results for input(s): INR, PROTIME in the last 168 hours.  Cardiac Enzymes: No results for input(s): CKTOTAL, CKMB, CKMBINDEX, TROPONINI in the last 168 hours. BNP (last 3 results) No results for input(s): PROBNP in the last 8760 hours. HbA1C: No results for input(s): HGBA1C in the last 72 hours. CBG: Recent Labs  Lab 12/20/18 0747 12/20/18 1103 12/20/18 1723 12/20/18 1954 12/21/18 0801  GLUCAP 73 210* 335* 368* 92   Lipid Profile: Recent Labs    12/21/18 0215  CHOL 109  HDL 54  LDLCALC 18  TRIG 185*  CHOLHDL 2.0   Thyroid Function Tests: No results for input(s): TSH, T4TOTAL, FREET4, T3FREE, THYROIDAB in the last 72 hours. Anemia Panel: Recent Labs    12/18/18 0933  FERRITIN 199   Urine analysis:    Component Value Date/Time   COLORURINE STRAW (A) 12/19/2018 1330   APPEARANCEUR CLEAR 12/19/2018 1330   LABSPEC 1.006 12/19/2018 1330   PHURINE 7.0 12/19/2018 1330   GLUCOSEU 50 (A) 12/19/2018 1330   HGBUR NEGATIVE 12/19/2018 1330   BILIRUBINUR NEGATIVE 12/19/2018 1330   KETONESUR NEGATIVE 12/19/2018 1330   PROTEINUR NEGATIVE 12/19/2018 1330   NITRITE NEGATIVE 12/19/2018 1330   LEUKOCYTESUR NEGATIVE 12/19/2018 1330   Sepsis Labs: @LABRCNTIP (procalcitonin:4,lacticidven:4)  )  Recent Results (from the past 240 hour(s))  SARS CORONAVIRUS 2 (TAT 6-24 HRS) Nasopharyngeal Nasopharyngeal Swab     Status: Abnormal   Collection Time: 12/11/18  3:51 PM   Specimen: Nasopharyngeal Swab  Result Value Ref Range Status   SARS Coronavirus 2 POSITIVE (A) NEGATIVE Final    Comment: RESULT CALLED TO, READ BACK BY AND VERIFIED WITH: EMAILED Aretta Nip 0154 12/12/18 G.MCADOO (NOTE) SARS-CoV-2 target nucleic acids are DETECTED. The SARS-CoV-2 RNA is generally detectable in upper and lower respiratory specimens during the acute phase of infection. Positive results are indicative of active infection with SARS-CoV-2. Clinical  correlation with patient history and other diagnostic information is necessary to determine patient infection status. Positive results do  not rule out bacterial infection or co-infection with other viruses. The expected result is Negative. Fact Sheet for Patients: SugarRoll.be Fact Sheet for Healthcare Providers: https://www.Artia Singley-mathews.com/ This test is not yet approved or cleared by the [place] FDA and  has been authorized for detection and/or diagnosis of SARS-CoV-2 by FDA under an Emergency Use Authorization (EUA). This EUA will remain  in effect (meaning this test can be used ) for the duration of the COVID-19 declaration under Section 564(b)(1) of the Act, 21 U.S.C. section 360bbb-3(b)(1), unless the authorization is terminated or revoked sooner. Performed at [facility], [place] [address], [place], [place] [address]   Blood Culture (routine x 2)     Status: None (Preliminary result)   Collection Time: 12/17/18 11:53 AM   Specimen: BLOOD  Result Value Ref Range Status   Specimen Description BLOOD RIGHT ANTECUBITAL  Final   Special Requests   Final    BOTTLES DRAWN AEROBIC AND ANAEROBIC Blood Culture results may not be optimal due to an inadequate volume of blood received in culture bottles    Culture   Final    NO GROWTH 3 DAYS Performed at [facility], [place] [address], [place], [place] [address]    Report Status PENDING  Incomplete  Blood Culture (routine x 2)     Status: Abnormal   Collection Time: 12/17/18 11:54 AM   Specimen: BLOOD LEFT HAND  Result Value Ref Range Status   Specimen Description BLOOD LEFT HAND  Final   Special Requests   Final    BOTTLES  DRAWN AEROBIC AND ANAEROBIC Blood Culture results may not be optimal due to an inadequate volume of blood received in culture bottles   Culture  Setup Time   Final    GRAM POSITIVE COCCI IN CLUSTERS ANAEROBIC BOTTLE ONLY CRITICAL RESULT CALLED TO, READ BACK BY AND VERIFIED WITH: [facility] [place] [id number] FCP    Culture (A)  Final    STAPHYLOCOCCUS SPECIES (COAGULASE NEGATIVE) THE SIGNIFICANCE OF ISOLATING THIS ORGANISM FROM A SINGLE SET OF BLOOD CULTURES WHEN MULTIPLE SETS ARE DRAWN IS UNCERTAIN. PLEASE NOTIFY THE MICROBIOLOGY DEPARTMENT WITHIN ONE WEEK IF SPECIATION AND SENSITIVITIES ARE REQUIRED. Performed at [facility], [place] [address] [place], [place] [address]    Report Status 12/20/2018 FINAL  Final  Culture, Urine     Status: Abnormal   Collection Time: 12/19/18  1:30 PM   Specimen: Urine, Clean Catch  Result Value Ref Range Status   Specimen Description   Final    URINE, CLEAN CATCH Performed at [facility], [place] [address] [place], [place] [address]    Special Requests   Final    NONE Performed at [facility], [place] [address] [place], [place] [address]    Culture (A)  Final    <10,000 COLONIES/mL INSIGNIFICANT GROWTH Performed at [place] [address] [place],  [address]    Report Status 12/20/2018 FINAL  Final         Radiology Studies: No results found.      Scheduled Meds: . amLODipine  10 mg Oral Daily  . calcium-vitamin D  1 tablet Oral Q lunch  . enoxaparin (LOVENOX) injection  40 mg Subcutaneous  Q24H  . feeding supplement (ENSURE ENLIVE)  237 mL Oral BID BM  . hydrALAZINE  75 mg Oral Q8H  . insulin aspart  0-15 Units Subcutaneous TID WC  . insulin aspart  0-5 Units Subcutaneous QHS  . insulin aspart  8 Units Subcutaneous TID WC  . insulin glargine  30 Units Subcutaneous Daily  . methylPREDNISolone (SOLU-MEDROL) injection  40 mg Intravenous Daily  . multivitamin with minerals  1 tablet Oral Daily  . nortriptyline  25 mg Oral QHS  . pantoprazole  40 mg Oral Daily  . simvastatin  20 mg Oral Daily  . sodium chloride flush  3 mL Intravenous Q12H  . vitamin B-12  1,000 mcg Oral Q lunch   Continuous Infusions: . sodium chloride    . remdesivir 100 mg in NS 250 mL 100 mg (12/21/18 0843)     LOS: 4 days   The patient is critically ill with multiple organ systems failure and requires high complexity decision making for assessment and support, frequent evaluation and titration of therapies, application of advanced monitoring technologies and extensive interpretation of multiple databases. Critical Care Time devoted to patient care services described in this note  Time spent: 40 minutes     Wilda Wetherell, Geraldo Docker, MD [facility] Pager [phone]  If 7PM-7AM, please contact night-coverage [email or web address] Password [email or web address] 12/21/2018, 9:07 AM

## 2018-12-22 LAB — COMPREHENSIVE METABOLIC PANEL
ALT: 28 U/L (ref 0–44)
AST: 26 U/L (ref 15–41)
Albumin: 3 g/dL — ABNORMAL LOW (ref 3.5–5.0)
Alkaline Phosphatase: 88 U/L (ref 38–126)
Anion gap: 12 (ref 5–15)
BUN: 48 mg/dL — ABNORMAL HIGH (ref 8–23)
CO2: 23 mmol/L (ref 22–32)
Calcium: 8.9 mg/dL (ref 8.9–10.3)
Chloride: 100 mmol/L (ref 98–111)
Creatinine, Ser: 1.39 mg/dL — ABNORMAL HIGH (ref 0.44–1.00)
GFR calc Af Amer: 40 mL/min — ABNORMAL LOW (ref >60)
GFR calc non Af Amer: 35 mL/min — ABNORMAL LOW (ref >60)
Glucose, Bld: 58 mg/dL — ABNORMAL LOW (ref 70–99)
Potassium: 4.3 mmol/L (ref 3.5–5.1)
Sodium: 135 mmol/L (ref 135–145)
Total Bilirubin: 0.3 mg/dL (ref 0.3–1.2)
Total Protein: 6 g/dL — ABNORMAL LOW (ref 6.5–8.1)

## 2018-12-22 LAB — CBC WITH DIFFERENTIAL/PLATELET
Abs Immature Granulocytes: 1.21 10*3/uL — ABNORMAL HIGH (ref 0.00–0.07)
Basophils Absolute: 0.1 10*3/uL (ref 0.0–0.1)
Basophils Relative: 1 %
Eosinophils Absolute: 0 10*3/uL (ref 0.0–0.5)
Eosinophils Relative: 0 %
HCT: 32.1 % — ABNORMAL LOW (ref 36.0–46.0)
Hemoglobin: 10.4 g/dL — ABNORMAL LOW (ref 12.0–15.0)
Immature Granulocytes: 9 %
Lymphocytes Relative: 19 %
Lymphs Abs: 2.5 10*3/uL (ref 0.7–4.0)
MCH: 27.8 pg (ref 26.0–34.0)
MCHC: 32.4 g/dL (ref 30.0–36.0)
MCV: 85.8 fL (ref 80.0–100.0)
Monocytes Absolute: 2.1 10*3/uL — ABNORMAL HIGH (ref 0.1–1.0)
Monocytes Relative: 16 %
Neutro Abs: 7.2 10*3/uL (ref 1.7–7.7)
Neutrophils Relative %: 55 %
Platelets: 528 10*3/uL — ABNORMAL HIGH (ref 150–400)
RBC: 3.74 MIL/uL — ABNORMAL LOW (ref 3.87–5.11)
RDW: 14 % (ref 11.5–15.5)
WBC: 13.1 10*3/uL — ABNORMAL HIGH (ref 4.0–10.5)
nRBC: 0.2 % (ref 0.0–0.2)

## 2018-12-22 LAB — GLUCOSE, CAPILLARY
Glucose-Capillary: 63 mg/dL — ABNORMAL LOW (ref 70–99)
Glucose-Capillary: 275 mg/dL — ABNORMAL HIGH (ref 70–99)
Glucose-Capillary: 305 mg/dL — ABNORMAL HIGH (ref 70–99)
Glucose-Capillary: 269 mg/dL — ABNORMAL HIGH (ref 70–99)

## 2018-12-22 LAB — CULTURE, BLOOD (ROUTINE X 2): Culture: NO GROWTH

## 2018-12-22 LAB — D-DIMER, QUANTITATIVE: D-Dimer, Quant: 0.48 ug/mL-FEU (ref 0.00–0.50)

## 2018-12-22 LAB — BRAIN NATRIURETIC PEPTIDE: B Natriuretic Peptide: 60.3 pg/mL (ref 0.0–100.0)

## 2018-12-22 LAB — C-REACTIVE PROTEIN: CRP: <0.8 mg/dL (ref <1.0)

## 2018-12-22 LAB — MAGNESIUM: Magnesium: 1.9 mg/dL (ref 1.7–2.4)

## 2018-12-22 MED ORDER — SODIUM CHLORIDE 0.9 % IV SOLN
INTRAVENOUS | Status: DC
  Administered 2018-12-23: 05:00:00 via INTRAVENOUS

## 2018-12-22 MED ORDER — INSULIN ASPART 100 UNIT/ML ~~LOC~~ SOLN
5.0000 [IU] | Freq: Three times a day (TID) | SUBCUTANEOUS | Status: DC
  Administered 2018-12-23 (×2): 5 [IU] via SUBCUTANEOUS

## 2018-12-22 NOTE — Progress Notes (Signed)
Pt daughter Maudie Mercury called for update/questions. Daughter states to call Jan (other daughter) tomorrow in regards to discharge as she will be picking her up. Kim inquiring about when home 02 will be delivered, will relay to day team- please update daughter.

## 2018-12-22 NOTE — Plan of Care (Signed)
  Problem: Education: Goal: Knowledge of risk factors and measures for prevention of condition will improve Outcome: Progressing   Problem: Respiratory: Goal: Will maintain a patent airway Outcome: Progressing   Problem: Education: Goal: Knowledge of General Education information will improve Description: Including pain rating scale, medication(s)/side effects and non-pharmacologic comfort measures Outcome: Progressing   Problem: Nutrition: Goal: Adequate nutrition will be maintained Outcome: Progressing   Problem: Coping: Goal: Level of anxiety will decrease Outcome: Progressing   Problem: Elimination: Goal: Will not experience complications related to bowel motility Outcome: Progressing   Problem: Pain Managment: Goal: General experience of comfort will improve Outcome: Progressing   Problem: Safety: Goal: Ability to remain free from injury will improve Outcome: Progressing   Problem: Skin Integrity: Goal: Risk for impaired skin integrity will decrease Outcome: Progressing

## 2018-12-22 NOTE — Progress Notes (Signed)
PROGRESS NOTE    Sue [name]  [id number] DOB: [birth year] DOA: 12/17/2018 PCP: Merrilee Seashore, MD   Brief Narrative:  85 year old WF   reportedly lives alone but has four kids who live in town and keep a close eye on her, ambulates with the help of a cane, PMHx diabetes type 2 uncontrolled with complication, HTN, macular degeneration, HLD, GERD, anxiety/depression.   She developed diarrhea and weakness after which her daughter brought her to the ER where she was diagnosed with COVID-19 infection.  She was recently at her sister's funeral where several folks were positive and she was exposed.   Subjective: 11/27  A/O x4, negative CP, negative N/V, negative S OB   Assessment & Plan:   Principal Problem:   Acute respiratory disease due to COVID-19 virus Active Problems:   Dehydration   Essential hypertension   Diabetes mellitus type 2, uncontrolled, with complications ([place])   HLD (hyperlipidemia)   Anxiety   Depression   Gastroenteritis due to COVID-19 virus   Acute renal failure superimposed on stage 3b chronic kidney disease (HCC)  Covid pneumonia/acute respiratory failure with hypoxia COVID-19 Labs  Recent Labs    12/20/18 0210 12/21/18 0215 12/22/18 0510  DDIMER 0.61* 0.36 0.48  CRP 1.3* 1.6* <0.8    Lab Results  Component Value Date   SARSCOV2NAA POSITIVE (A) 12/11/2018  -Remdesivir per pharmacy protocol -Solu-Medrol x10 days  -11/25 ambulatory SPO2 pending -PT/OT recommend home health with 24-hour supervision.   -11/26 spoke with patient again concerning her discharge.  Per patient 2 of her daughters will be rotating taking care of her when she is discharged.  Her ill son will not visit. SATURATION QUALIFICATIONS: (This note is used to comply with regulatory documentation for home oxygen) Patient Saturations on Room Air at Rest = 94% Patient Saturations on Room Air while Ambulating = 87% Patient Saturations on 2Liters of oxygen while Ambulating =  91% Please briefly explain why patient needs home oxygen: sob with ambulating -Patient meets criteria for home O2 -2 L O2, titrate O2 to maintain SPO2> 88% -Provide Inogen portable O2 concentrator  Covid gastroenteritis -Currently patient with negative N/V.  Eating.  Gastroenteritis appears to resolved  Dehydration -Appears resolved  Essential HTN -Amlodipine 10 mg daily -11/25 hydralazine increase to 75 mg TID -Hold ACE inhibitor and HCTZ  Acute on CKD stage III (baseline 1.23? ) -See HTN Recent Labs  Lab 12/18/18 0933 12/19/18 0331 12/20/18 0210 12/21/18 0215 12/22/18 0510  CREATININE 1.30* 1.34* 1.39* 1.42* 1.39*  -Strict in and out -2.5 L -Daily weight -11/26 normal saline 3ml/hr  Diabetes type 2 uncontrolled with complication -[id number] hemoglobin A1c= 9.1 -11/26 increase Lantus 35 units daily -11/27 decrease NovoLog 5 units  qac  -Moderate SSI  HLD -11/26 LDL = 18  -Simvastatin 20 mg daily  Anemia of chronic disease Recent Labs  Lab 12/18/18 0933 12/19/18 0331 12/20/18 0210 12/21/18 0215 12/22/18 0510  HGB 10.9* 9.8* 9.9* 10.7* 10.4*  -Stable  Anxiety/depression -Stable     DVT prophylaxis: Lovenox Code Status: DNR Family Communication: 11/26 spoke with Jan (Daughter) explained plan of care and answered all questions  Disposition Plan: TBD   Consultants:    Procedures/Significant Events:     I have personally reviewed and interpreted all radiology studies and my findings are as above.  VENTILATOR SETTINGS:    Cultures   Antimicrobials: Anti-infectives (From admission, onward)   Start     Stop   12/20/18 1000  remdesivir 100 mg in  sodium chloride 0.9 % 250 mL IVPB     12/24/18 0959   12/19/18 1015  remdesivir 200 mg in sodium chloride 0.9 % 250 mL IVPB     12/19/18 1407   12/17/18 1700  cefTRIAXone (ROCEPHIN) 1 g in sodium chloride 0.9 % 100 mL IVPB  Status:  Discontinued     12/19/18 0829   12/17/18 1700  azithromycin  (ZITHROMAX) 500 mg in sodium chloride 0.9 % 250 mL IVPB  Status:  Discontinued     12/19/18 0829       Devices    LINES / TUBES:      Continuous Infusions: . sodium chloride    . sodium chloride 75 mL/hr at 12/22/18 0400  . remdesivir 100 mg in NS 250 mL 100 mg (12/21/18 0843)     Objective: Vitals:   12/21/18 2100 12/22/18 0358 12/22/18 0500 12/22/18 0840  BP: (!) 156/77 (!) 164/82  (!) 159/84  Pulse: 98 97  (!) 109  Resp: 20 18  20   Temp: 98 F (36.7 C) 97.9 F (36.6 C)  98 F (36.7 C)  TempSrc: Oral Oral  Oral  SpO2: 97% 97%  96%  Weight:   81.1 kg   Height:        Intake/Output Summary (Last 24 hours) at 12/22/2018 0849 Last data filed at 12/22/2018 0400 Gross per 24 hour  Intake 1292.52 ml  Output 1150 ml  Net 142.52 ml   Filed Weights   12/17/18 1438 12/22/18 0500  Weight: 81.6 kg 81.1 kg   Physical Exam:  General: A/O x4 no acute respiratory distress Eyes: negative scleral hemorrhage, negative anisocoria, negative icterus ENT: Negative Runny nose, negative gingival bleeding, Neck:  Negative scars, masses, torticollis, lymphadenopathy, JVD Lungs: Clear to auscultation bilaterally without wheezes or crackles Cardiovascular: Regular rate and rhythm without murmur gallop or rub normal S1 and S2 Abdomen: negative abdominal pain, nondistended, positive soft, bowel sounds, no rebound, no ascites, no appreciable mass Extremities: No significant cyanosis, clubbing, or edema bilateral lower extremities Skin: Negative rashes, lesions, ulcers Psychiatric:  Negative depression, negative anxiety, negative fatigue, negative mania  Central nervous system:  Cranial nerves II through XII intact, tongue/uvula midline, all extremities muscle strength 5/5, sensation intact throughout, negative dysarthria, negative expressive aphasia, negative receptive aphasia.      Data Reviewed: Care during the described time interval was provided by me .  I have reviewed this  patient's available data, including medical history, events of note, physical examination, and all test results as part of my evaluation.   CBC: Recent Labs  Lab 12/18/18 0933 12/19/18 0331 12/20/18 0210 12/21/18 0215 12/22/18 0510  WBC 4.5 8.4 11.7* 11.8* 13.1*  NEUTROABS 3.9 7.1 8.8* 7.6 7.2  HGB 10.9* 9.8* 9.9* 10.7* 10.4*  HCT 33.7* 30.3* 30.2* 32.5* 32.1*  MCV 86.6 85.4 85.3 85.8 85.8  PLT 412* 431* 474* 504* 0000000*   Basic Metabolic Panel: Recent Labs  Lab 12/18/18 0933 12/19/18 0331 12/20/18 0210 12/21/18 0215 12/22/18 0510  NA 130* 133* 131* 133* 135  K 4.5 4.6 4.5 4.3 4.3  CL 93* 98 97* 97* 100  CO2 22 24 23 23 23   GLUCOSE 367* 164* 124* 132* 58*  BUN 16 30* 42* 46* 48*  CREATININE 1.30* 1.34* 1.39* 1.42* 1.39*  CALCIUM 8.4* 9.0 9.0 9.1 8.9  MG 1.6* 2.5* 2.0 1.9 1.9  PHOS 3.4  --   --   --   --    GFR: Estimated Creatinine Clearance: 32.3 mL/min (  A) (by C-G formula based on SCr of 1.39 mg/dL (H)). Liver Function Tests: Recent Labs  Lab 12/18/18 0933 12/19/18 0331 12/20/18 0210 12/21/18 0215 12/22/18 0510  AST 34 30 32 28 26  ALT 25 23 25 28 28   ALKPHOS 98 98 87 92 88  BILITOT 0.9 0.4 0.5 0.4 0.3  PROT 6.2* 6.2* 6.2* 6.1* 6.0*  ALBUMIN 2.8* 2.9* 3.1* 3.1* 3.0*   No results for input(s): LIPASE, AMYLASE in the last 168 hours. No results for input(s): AMMONIA in the last 168 hours. Coagulation Profile: No results for input(s): INR, PROTIME in the last 168 hours. Cardiac Enzymes: No results for input(s): CKTOTAL, CKMB, CKMBINDEX, TROPONINI in the last 168 hours. BNP (last 3 results) No results for input(s): PROBNP in the last 8760 hours. HbA1C: No results for input(s): HGBA1C in the last 72 hours. CBG: Recent Labs  Lab 12/20/18 1954 12/21/18 0801 12/21/18 1223 12/21/18 1702 12/21/18 2031  GLUCAP 368* 92 346* 377* 299*   Lipid Profile: Recent Labs    12/21/18 0215  CHOL 109  HDL 54  LDLCALC 18  TRIG 185*  CHOLHDL 2.0   Thyroid  Function Tests: No results for input(s): TSH, T4TOTAL, FREET4, T3FREE, THYROIDAB in the last 72 hours. Anemia Panel: No results for input(s): VITAMINB12, FOLATE, FERRITIN, TIBC, IRON, RETICCTPCT in the last 72 hours. Urine analysis:    Component Value Date/Time   COLORURINE STRAW (A) 12/19/2018 1330   APPEARANCEUR CLEAR 12/19/2018 1330   LABSPEC 1.006 12/19/2018 1330   PHURINE 7.0 12/19/2018 1330   GLUCOSEU 50 (A) 12/19/2018 1330   HGBUR NEGATIVE 12/19/2018 1330   BILIRUBINUR NEGATIVE 12/19/2018 1330   KETONESUR NEGATIVE 12/19/2018 1330   PROTEINUR NEGATIVE 12/19/2018 1330   NITRITE NEGATIVE 12/19/2018 1330   LEUKOCYTESUR NEGATIVE 12/19/2018 1330   Sepsis Labs: @LABRCNTIP (procalcitonin:4,lacticidven:4)  ) Recent Results (from the past 240 hour(s))  Blood Culture (routine x 2)     Status: None (Preliminary result)   Collection Time: 12/17/18 11:53 AM   Specimen: BLOOD  Result Value Ref Range Status   Specimen Description BLOOD RIGHT ANTECUBITAL  Final   Special Requests   Final    BOTTLES DRAWN AEROBIC AND ANAEROBIC Blood Culture results may not be optimal due to an inadequate volume of blood received in culture bottles   Culture   Final    NO GROWTH 4 DAYS Performed at [facility], [place] [address] [place], [place] [address]    Report Status PENDING  Incomplete  Blood Culture (routine x 2)     Status: Abnormal   Collection Time: 12/17/18 11:54 AM   Specimen: BLOOD LEFT HAND  Result Value Ref Range Status   Specimen Description BLOOD LEFT HAND  Final   Special Requests   Final    BOTTLES DRAWN AEROBIC AND ANAEROBIC Blood Culture results may not be optimal due to an inadequate volume of blood received in culture bottles   Culture  Setup Time   Final    GRAM POSITIVE COCCI IN CLUSTERS ANAEROBIC BOTTLE ONLY CRITICAL RESULT CALLED TO, READ BACK BY AND VERIFIED WITH: [place] [id number] FCP    Culture (A)  Final    STAPHYLOCOCCUS SPECIES (COAGULASE  NEGATIVE) THE SIGNIFICANCE OF ISOLATING THIS ORGANISM FROM A SINGLE SET OF BLOOD CULTURES WHEN MULTIPLE SETS ARE DRAWN IS UNCERTAIN. PLEASE NOTIFY THE MICROBIOLOGY DEPARTMENT WITHIN ONE WEEK IF SPECIATION AND SENSITIVITIES ARE REQUIRED. Performed at [facility], [place] [address] [place], [place] [address]  Report Status 12/20/2018 FINAL  Final  Culture, Urine     Status: Abnormal   Collection Time: 12/19/18  1:30 PM   Specimen: Urine, Clean Catch  Result Value Ref Range Status   Specimen Description   Final    URINE, CLEAN CATCH Performed at [facility], [place] [address], [place], [place] [address]    Special Requests   Final    NONE Performed at [facility], [place] [address] [place], [place] [address]    Culture (A)  Final    <10,000 COLONIES/mL INSIGNIFICANT GROWTH Performed at [place] [address], [place], [place] [address]    Report Status 12/20/2018 FINAL  Final         Radiology Studies: No results found.      Scheduled Meds: . amLODipine  10 mg Oral Daily  . calcium-vitamin D  1 tablet Oral Q lunch  . enoxaparin (LOVENOX) injection  40 mg Subcutaneous Q24H  . feeding supplement (ENSURE ENLIVE)  237 mL Oral BID BM  . hydrALAZINE  75 mg Oral Q8H  . insulin aspart  0-15 Units Subcutaneous TID WC  . insulin aspart  0-5 Units Subcutaneous QHS  . insulin aspart  8 Units Subcutaneous TID WC  . insulin glargine  35 Units Subcutaneous Daily  . methylPREDNISolone (SOLU-MEDROL) injection  40 mg Intravenous Daily  . multivitamin with minerals  1 tablet Oral Daily  . nortriptyline  25 mg Oral QHS  . pantoprazole  40 mg Oral Daily  . simvastatin  20 mg Oral Daily  . sodium chloride flush  3 mL Intravenous Q12H  . vitamin B-12  1,000 mcg Oral Q lunch   Continuous Infusions: . sodium chloride    . sodium chloride 75 mL/hr at 12/22/18 0400  . remdesivir 100 mg in NS 250 mL 100 mg (12/21/18 0843)     LOS: 5  days   The patient is critically ill with multiple organ systems failure and requires high complexity decision making for assessment and support, frequent evaluation and titration of therapies, application of advanced monitoring technologies and extensive interpretation of multiple databases. Critical Care Time devoted to patient care services described in this note  Time spent: 40 minutes     Maize Brittingham, Geraldo Docker, MD [facility] Pager [phone]  If 7PM-7AM, please contact night-coverage [email or web address] Password [facility] 12/22/2018, 8:49 AM

## 2018-12-23 DIAGNOSIS — R7989 Other specified abnormal findings of blood chemistry: Secondary | ICD-10-CM

## 2018-12-23 DIAGNOSIS — N179 Acute kidney failure, unspecified: Secondary | ICD-10-CM

## 2018-12-23 LAB — GLUCOSE, CAPILLARY
Glucose-Capillary: 59 mg/dL — ABNORMAL LOW (ref 70–99)
Glucose-Capillary: 347 mg/dL — ABNORMAL HIGH (ref 70–99)

## 2018-12-23 MED ORDER — INSULIN LISPRO (1 UNIT DIAL) 100 UNIT/ML (KWIKPEN): 5.0000 [IU] | PEN_INJECTOR | Freq: Three times a day (TID) | SUBCUTANEOUS | 0 refills | Status: DC

## 2018-12-23 MED ORDER — ZOLPIDEM TARTRATE 5 MG PO TABS: 5.0000 mg | ORAL_TABLET | Freq: Every evening | ORAL | 0 refills | Status: DC | PRN

## 2018-12-23 MED ORDER — ALBUTEROL SULFATE HFA 108 (90 BASE) MCG/ACT IN AERS: 2.0000 | INHALATION_SPRAY | RESPIRATORY_TRACT | 0 refills | Status: DC | PRN

## 2018-12-23 MED ORDER — TRESIBA FLEXTOUCH 200 UNIT/ML ~~LOC~~ SOPN: 35.0000 [IU] | PEN_INJECTOR | Freq: Every day | SUBCUTANEOUS | 0 refills | Status: AC

## 2018-12-23 MED ORDER — DEXAMETHASONE 6 MG PO TABS: 6.0000 mg | ORAL_TABLET | Freq: Every day | ORAL | 0 refills | Status: DC

## 2018-12-23 MED ORDER — NORTRIPTYLINE HCL 25 MG PO CAPS: 25.0000 mg | ORAL_CAPSULE | Freq: Every day | ORAL | 0 refills | Status: AC

## 2018-12-23 MED ORDER — ACETAMINOPHEN 325 MG PO TABS: 650.0000 mg | ORAL_TABLET | Freq: Four times a day (QID) | ORAL | 0 refills | Status: DC | PRN

## 2018-12-23 MED ORDER — NORTRIPTYLINE HCL 25 MG PO CAPS: 25.0000 mg | ORAL_CAPSULE | Freq: Every day | ORAL | 0 refills | Status: DC

## 2018-12-23 MED ORDER — DEXAMETHASONE 6 MG PO TABS: 6.0000 mg | ORAL_TABLET | Freq: Every day | ORAL | Status: DC

## 2018-12-23 MED ORDER — HYDRALAZINE HCL 25 MG PO TABS: 75.0000 mg | ORAL_TABLET | Freq: Three times a day (TID) | ORAL | 0 refills | Status: DC

## 2018-12-23 MED ORDER — HYDRALAZINE HCL 25 MG PO TABS: 75.0000 mg | ORAL_TABLET | Freq: Three times a day (TID) | ORAL | 0 refills | Status: AC

## 2018-12-23 NOTE — Discharge Instructions (Signed)

## 2018-12-23 NOTE — Progress Notes (Signed)
RN spoke with pt.;s daughter, discharge instructions given to daughter over the phone. Pt. Stable at discharge. Pt. Sent home with oxygen tank and all belongings. No further concerns.

## 2018-12-23 NOTE — Discharge Summary (Signed)
Physician Discharge Summary  Sue [name] [id number] DOB: 01-Feb-1932 DOA: 12/17/2018  PCP: Merrilee Seashore, MD  Admit date: 12/17/2018 Discharge date: 12/23/2018  Time spent: 35 minutes  Recommendations for Outpatient Follow-up:   Covid pneumonia/acute respiratory failure with hypoxia COVID-19 Labs  Recent Labs (last 2 labs)        Recent Labs    12/20/18 0210 12/21/18 0215 12/22/18 0510  DDIMER 0.61* 0.36 0.48  CRP 1.3* 1.6* <0.8      Recent Labs       Lab Results  Component Value Date   SARSCOV2NAA POSITIVE (A) 12/11/2018    -Remdesivir per pharmacy protocol -Solu-Medrol x10 days  -11/25 ambulatory SPO2 pending -PT/OT recommend home health with 24-hour supervision.   -11/26 spoke with patient again concerning her discharge.  Per patient 2 of her daughters will be rotating taking care of her when she is discharged.  Her ill son will not visit. SATURATION QUALIFICATIONS: (Thisnote is usedto comply with regulatory documentation for home oxygen) Patient Saturations on Room Air at Rest =94% Patient Saturations on [employer] while Ambulating =87% Patient Saturations on2Liters of oxygen while Ambulating =91% Please briefly explain why patient needs home oxygen:sob with ambulating -Patient meets criteria for home O2 -2 L O2, titrate O2 to maintain SPO2> 88% -Provide Inogen portable O2 concentrator -Have discussed with patient and daughter that she will be considered contagious for 20 days post discharge.  Patient and family members are to take appropriate quarantine procedures, to include both parties wearing masks, patient limited to a certain section of the house.  Frequent handwashing.,  Social distancing, and limiting patient to her on room. -Since patient has new O2 demand will require follow-up with [facility], in 3-4 weeks  Covid gastroenteritis -Currently patient with negative N/V.  Eating.  Gastroenteritis has  resolved  Dehydration -resolved  Essential HTN -Amlodipine 10 mg daily -11/25 hydralazine increase to 75 mg TID -Hold ACE inhibitor and HCTZ  Acute on CKD stage III (baseline 1.23? ) -See HTN Recent Labs  Lab 12/18/18 0933 12/19/18 0331 12/20/18 0210 12/21/18 0215 12/22/18 0510  CREATININE 1.30* 1.34* 1.39* 1.42* 1.39*  Strict in and out  -873.69ml -Daily weight Filed Weights   12/17/18 1438 12/22/18 0500 12/23/18 0438  Weight: 81.6 kg 81.1 kg 82.3 kg    Diabetes type 2 uncontrolled with complication -[id number] hemoglobin A1c= 9.1 -Tresiba  35 units daily -Humalog 5 units  qac   HLD -11/26 LDL = 18  -Simvastatin 20 mg daily  Anemia of chronic disease Last Labs          Recent Labs  Lab 12/18/18 0933 12/19/18 0331 12/20/18 0210 12/21/18 0215 12/22/18 0510  HGB 10.9* 9.8* 9.9* 10.7* 10.4*    -Stable  Anxiety/depression -Stable  Discharge Diagnoses:  Principal Problem:   Acute respiratory disease due to COVID-19 virus Active Problems:   Dehydration   Essential hypertension   Diabetes mellitus type 2, uncontrolled, with complications (HCC)   HLD (hyperlipidemia)   Anxiety   Depression   Gastroenteritis due to COVID-19 virus   Acute renal failure superimposed on stage 3b chronic kidney disease ([place])   Discharge Condition: Stable  Diet recommendation: Carb modified  Filed Weights   12/17/18 1438 12/22/18 0500 12/23/18 0438  Weight: 81.6 kg 81.1 kg 82.3 kg    History of present illness:  85 year old WF   reportedly lives alone but has four kids who live in town and keep a close eye on her, ambulates with the help of  a cane, PMHx diabetes type 2 uncontrolled with complication, HTN, macular degeneration, HLD, GERD, anxiety/depression.  She developeddiarrhea and weakness after which her daughter brought her to the ER where she was diagnosed with COVID-19 infection. She was recently at her sister's funeral where several folkswere positive and  she was exposed.  Hospital Course:  During this hospitalization patient was treated for COVID-19 Acute Pneumonia/acute respiratory failure with hypoxia, and Covid gastroenteritis.  Patient responded well to Covid protocol and recovered well.  Patient does have some residual shortness of breath and qualifies for home O2.  Patient's stay was complicated by acute on CKD stage III which has improved but is not fully back to baseline yet and will have to be monitored by her PCP as an outpatient.    Cultures   11/16 SARS coronavirus positive    Antibiotics Anti-infectives (From admission, onward)   Start     Stop   12/20/18 1000  remdesivir 100 mg in sodium chloride 0.9 % 250 mL IVPB     12/23/18 0931   12/19/18 1015  remdesivir 200 mg in sodium chloride 0.9 % 250 mL IVPB     12/19/18 1407   12/17/18 1700  cefTRIAXone (ROCEPHIN) 1 g in sodium chloride 0.9 % 100 mL IVPB  Status:  Discontinued     12/19/18 0829   12/17/18 1700  azithromycin (ZITHROMAX) 500 mg in sodium chloride 0.9 % 250 mL IVPB  Status:  Discontinued     12/19/18 0829       Discharge Exam: Vitals:   12/23/18 0430 12/23/18 0432 12/23/18 0438 12/23/18 0724  BP: (!) 167/74 (!) 161/72  (!) 144/73  Pulse:  97  91  Resp:  18  19  Temp:  97.8 F (36.6 C)  97.6 F (36.4 C)  TempSrc:  Oral  Oral  SpO2:  96%  99%  Weight:   82.3 kg   Height:        General: A/O x4 no acute respiratory distress Eyes: negative scleral hemorrhage, negative anisocoria, negative icterus ENT: Negative Runny nose, negative gingival bleeding, Neck:  Negative scars, masses, torticollis, lymphadenopathy, JVD Lungs: Clear to auscultation bilaterally without wheezes or crackles Cardiovascular: Regular rate and rhythm without murmur gallop or rub normal S1 and S2   Discharge Instructions   Allergies as of 12/23/2018   No Known Allergies     Medication List    STOP taking these medications   enalapril 20 MG tablet Commonly known as:  VASOTEC   glipiZIDE 10 MG 24 hr tablet Commonly known as: GLUCOTROL XL   hydrochlorothiazide 25 MG tablet Commonly known as: HYDRODIURIL   multivitamin with minerals Tabs tablet     TAKE these medications   acetaminophen 325 MG tablet Commonly known as: TYLENOL Take 2 tablets (650 mg total) by mouth every 6 (six) hours as needed for mild pain or headache (fever >/= 101). What changed:   medication strength  how much to take  reasons to take this   albuterol 108 (90 Base) MCG/ACT inhaler Commonly known as: VENTOLIN HFA Inhale 2 puffs into the lungs every 2 (two) hours as needed for wheezing or shortness of breath.   amLODipine 10 MG tablet Commonly known as: NORVASC Take 10 mg by mouth every morning.   Pen Needles 32G X 4 MM Misc BD Ultra-Fine Nano Pen Needle 32 gauge x 5/32"  USE AS DIRECTED DAILY   B-D ULTRAFINE III SHORT PEN 31G X 8 MM Misc Generic drug: Insulin Pen Needle  USE AS DIRECTED ONCE DAILY SUBCUTANEOUSLY   calcium-vitamin D 500-200 MG-UNIT tablet Commonly known as: OSCAL WITH D Take 1 tablet by mouth daily with lunch.   dexamethasone 6 MG tablet Commonly known as: DECADRON Take 1 tablet (6 mg total) by mouth daily. Start taking on: December 24, 2018   diphenoxylate-atropine 2.5-0.025 MG tablet Commonly known as: LOMOTIL Take 1 tablet by mouth every 8 (eight) hours as needed for diarrhea or loose stools.   Fish Oil 1200 MG Caps Take 1,200 mg by mouth daily with lunch.   guaifenesin 100 MG/5ML syrup Commonly known as: ROBITUSSIN Take 400 mg by mouth 3 (three) times daily as needed for cough or congestion.   hydrALAZINE 25 MG tablet Commonly known as: APRESOLINE Take 3 tablets (75 mg total) by mouth every 8 (eight) hours.   insulin lispro 100 UNIT/ML KwikPen Commonly known as: HumaLOG KwikPen Inject 0.05 mLs (5 Units total) into the skin 3 (three) times daily before meals.   nortriptyline 25 MG capsule Commonly known as: PAMELOR Take 1  capsule (25 mg total) by mouth at bedtime. What changed: when to take this   omeprazole 20 MG capsule Commonly known as: PRILOSEC Take 20 mg by mouth every morning.   ondansetron 4 MG tablet Commonly known as: ZOFRAN Take 4 mg by mouth 2 (two) times daily as needed for nausea or vomiting.   glucose blood test strip OneTouch Ultra Test strips  TEST ONCE A DAY ONCE A DAY FINGERSTICK 90   ONE TOUCH ULTRA TEST test strip Generic drug: glucose blood   onetouch ultrasoft lancets OneTouch UltraSoft Lancets  USE TWICE DAILY   PRESCRIPTION MEDICATION by Intravitreal route every 30 (thirty) days. Ophthalmic injection at MD office in left eye   PreserVision AREDS 2 Caps Take 1 capsule by mouth 2 (two) times daily.   Adult One Daily Gummies Chew Chew 2 tablets by mouth daily with lunch. Centrum   Airborne Tbef Take 1 tablet by mouth daily with lunch.   simvastatin 20 MG tablet Commonly known as: ZOCOR Take 20 mg by mouth at bedtime.   Systane 0.4-0.3 % Soln Generic drug: Polyethyl Glycol-Propyl Glycol Place 1 drop into both eyes 3 (three) times daily as needed (dry eyes).   Tyler Aas FlexTouch 200 UNIT/ML Sopn Generic drug: Insulin Degludec Inject 36 Units into the skin at bedtime. What changed: how much to take   vitamin B-12 1000 MCG tablet Commonly known as: CYANOCOBALAMIN Take 1,000 mcg by mouth daily with lunch.   zolpidem 5 MG tablet Commonly known as: AMBIEN Take 1 tablet (5 mg total) by mouth at bedtime as needed for sleep.            Durable Medical Equipment  (From admission, onward)         Start     Ordered   12/22/18 0852  For home use only DME oxygen  Once    Comments: SATURATION QUALIFICATIONS: (This note is used to comply with regulatory documentation for home oxygen) Patient Saturations on Room Air at Rest = 94% Patient Saturations on Room Air while Ambulating = 87% Patient Saturations on 2Liters of oxygen while Ambulating = 91% Please briefly  explain why patient needs home oxygen: sob with ambulating -Patient meets criteria for home O2 -2 L O2, titrate O2 to maintain SPO2> 88% -Provide Inogen portable O2 concentrator  Question Answer Comment  Length of Need 12 Months   Mode or (Route) Nasal cannula   Liters per Minute 2  Frequency Continuous (stationary and portable oxygen unit needed)   Oxygen conserving device Yes   Oxygen delivery system Gas      12/22/18 0851         No Known Allergies Follow-up Information    [facility]. Schedule an appointment as soon as possible for a visit in 4 week(s).   Specialty: Pulmonology Why: Post COVID-19 follow-up Contact information: [address] [place]  SSN-[id number] [phone]           The results of significant diagnostics from this hospitalization (including imaging, microbiology, ancillary and laboratory) are listed below for reference.    Significant Diagnostic Studies: Dg Chest Portable 1 View  Result Date: 12/17/2018 CLINICAL DATA:  Shortness of breath.  COVID-19 EXAM: PORTABLE CHEST 1 VIEW COMPARISON:  Six days ago FINDINGS: Bilateral airspace disease densest in the right upper lobe where there is also some volume loss. Lung volumes are low. Normal heart size for technique. Aortic tortuosity. IMPRESSION: Worsening bilateral pneumonia. Electronically Signed   By: Monte Fantasia M.D.   On: 12/17/2018 09:59   Dg Chest Portable 1 View  Result Date: 12/11/2018 CLINICAL DATA:  Cough EXAM: PORTABLE CHEST 1 VIEW COMPARISON:  Radiograph May 29, 2009 FINDINGS: Lung volumes are low with hazy interstitial and more patchy airspace opacity in the lung bases, greater than expected for atelectatic change. No pneumothorax or effusion. The aorta is calcified. The remaining cardiomediastinal contours are unremarkable. Degenerative changes are present in the imaged spine and shoulders. IMPRESSION: Low lung volumes with basilar atelectasis. More  patchy opacity could reflect a developing infection or early edema. Electronically Signed   By: Lovena Le M.D.   On: 12/11/2018 15:36    Microbiology: Recent Results (from the past 240 hour(s))  Blood Culture (routine x 2)     Status: None   Collection Time: 12/17/18 11:53 AM   Specimen: BLOOD  Result Value Ref Range Status   Specimen Description BLOOD RIGHT ANTECUBITAL  Final   Special Requests   Final    BOTTLES DRAWN AEROBIC AND ANAEROBIC Blood Culture results may not be optimal due to an inadequate volume of blood received in culture bottles   Culture   Final    NO GROWTH 5 DAYS Performed at [facility], [place] [address] [place], [place] [address]    Report Status 12/22/2018 FINAL  Final  Blood Culture (routine x 2)     Status: Abnormal   Collection Time: 12/17/18 11:54 AM   Specimen: BLOOD LEFT HAND  Result Value Ref Range Status   Specimen Description BLOOD LEFT HAND  Final   Special Requests   Final    BOTTLES DRAWN AEROBIC AND ANAEROBIC Blood Culture results may not be optimal due to an inadequate volume of blood received in culture bottles   Culture  Setup Time   Final    GRAM POSITIVE COCCI IN CLUSTERS ANAEROBIC BOTTLE ONLY CRITICAL RESULT CALLED TO, READ BACK BY AND VERIFIED WITH: Salvisa Accokeek [id number] FCP    Culture (A)  Final    STAPHYLOCOCCUS SPECIES (COAGULASE NEGATIVE) THE SIGNIFICANCE OF ISOLATING THIS ORGANISM FROM A SINGLE SET OF BLOOD CULTURES WHEN MULTIPLE SETS ARE DRAWN IS UNCERTAIN. PLEASE NOTIFY THE MICROBIOLOGY DEPARTMENT WITHIN ONE WEEK IF SPECIATION AND SENSITIVITIES ARE REQUIRED. Performed at [facility], [place] [address] [place], [place] [address]    Report Status 12/20/2018 FINAL  Final  Culture, Urine     Status: Abnormal  Collection Time: 12/19/18  1:30 PM   Specimen: Urine, Clean Catch  Result Value Ref Range Status   Specimen Description   Final    URINE, CLEAN CATCH Performed at [facility], [place] [address] [place], [place] [address]    Special Requests   Final    NONE Performed at [facility], [place] [address], [place], [place] [address]    Culture (A)  Final    <10,000 COLONIES/mL INSIGNIFICANT GROWTH Performed at [place] [address], [place],  [address]    Report Status 12/20/2018 FINAL  Final     Labs: Basic Metabolic Panel: Recent Labs  Lab 12/18/18 0933 12/19/18 0331 12/20/18 0210 12/21/18 0215 12/22/18 0510  NA 130* 133* 131* 133* 135  K 4.5 4.6 4.5 4.3 4.3  CL 93* 98 97* 97* 100  CO2 22 24 23 23 23   GLUCOSE 367* 164* 124* 132* 58*  BUN 16 30* 42* 46* 48*  CREATININE 1.30* 1.34* 1.39* 1.42* 1.39*  CALCIUM 8.4* 9.0 9.0 9.1 8.9  MG 1.6* 2.5* 2.0 1.9 1.9  PHOS 3.4  --   --   --   --    Liver Function Tests: Recent Labs  Lab 12/18/18 0933 12/19/18 0331 12/20/18 0210 12/21/18 0215 12/22/18 0510  AST 34 30 32 28 26  ALT 25 23 25 28 28   ALKPHOS 98 98 87 92 88  BILITOT 0.9 0.4 0.5 0.4 0.3  PROT 6.2* 6.2* 6.2* 6.1* 6.0*  ALBUMIN 2.8* 2.9* 3.1* 3.1* 3.0*   No results for input(s): LIPASE, AMYLASE in the last 168 hours. No results for input(s): AMMONIA in the last 168 hours. CBC: Recent Labs  Lab 12/18/18 0933 12/19/18 0331 12/20/18 0210 12/21/18 0215 12/22/18 0510  WBC 4.5 8.4 11.7* 11.8* 13.1*  NEUTROABS 3.9 7.1 8.8* 7.6 7.2  HGB 10.9* 9.8* 9.9* 10.7* 10.4*  HCT 33.7* 30.3* 30.2* 32.5* 32.1*  MCV 86.6 85.4 85.3 85.8 85.8  PLT 412* 431* 474* 504* 528*   Cardiac Enzymes: No results for input(s): CKTOTAL, CKMB, CKMBINDEX, TROPONINI in the last 168 hours. BNP: BNP (last 3 results) Recent Labs    12/20/18 0210 12/21/18 0215 12/22/18 0510  BNP 78.0 69.4 60.3    ProBNP (last 3 results) No results for input(s): PROBNP in the last 8760 hours.  CBG: Recent Labs  Lab 12/22/18 0839 12/22/18 1236 12/22/18 1740 12/22/18 2105 12/23/18 0754  GLUCAP 63* 275* 305* 269* 59*        Signed:  Dia Crawford, MD [facility] [phone] pager

## 2018-12-23 NOTE — TOC Initial Note (Signed)
Transition of Care [facility]) - Initial/Assessment Note    Patient Details  Name: Sue [name] MRN: [id number] Date of Birth: Aug 12, 1932  [facility]) CM/SW Contact:    Ihor Gully, LCSW Phone Number: 12/23/2018, 1:19 PM  Clinical Narrative:                 Dtr. Lenon Curt provided history. Patient from home alone. Admitted for acute respiratory diese due to COVID-19 virus. At baseline, patient is active and walks 3x/week at an indoor track. She ambulates with a cane. She has DME in the home consisting of a wheelchair, rollator, potty chair and shower chair.   Discharge plan is for daughter, Jan, to stay with patient. Jan has been staying with patient prior to hospitalization due to patient's sister dying about three weeks ago and has been emotionally upset.  Patient's need for home oxygen and HHPT discussed with Jan. Choices given.  Referral made to [facility] at for [place] (by Danelle Earthly). Secure message sent to attending request HHPT orders be put in.  Referral made to [facility] for oygen (by Danelle Earthly).   Expected Discharge Plan: Media Barriers to Discharge: No Barriers Identified   Patient Goals and CMS Choice Patient states their goals for this hospitalization and ongoing recovery are:: Daughter wants mother to get back to baseline.   Choice offered to / list presented to : Adult Children  Expected Discharge Plan and Services Expected Discharge Plan: [place]       Living arrangements for the past 2 months: Single Family Home Expected Discharge Date: 12/23/18               DME Arranged: Oxygen DME Agency: [place] Date DME Agency Contacted: 12/23/18 Time DME Agency Contacted: 1310 Representative spoke with at DME Agency: Magda Paganini HH Arranged: PT [place]: [place] Date [place]: 12/23/18 Time HH Agency Contacted: 53 Representative spoke with at [place]: [place]  Arrangements/Services Living arrangements for the past 2 months: [place] Lives with:: Self Patient language and need for interpreter reviewed:: Yes Do you feel safe going back to the place where you live?: Yes      Need for Family Participation in Patient Care: Yes (Comment) Care giver support system in place?: Yes (comment) Current home services: DME Criminal Activity/Legal Involvement Pertinent to Current Situation/Hospitalization: No - Comment as needed  Activities of Daily Living Home Assistive Devices/Equipment: Eyeglasses ADL Screening (condition at time of admission) Patient's cognitive ability adequate to safely complete daily activities?: Yes Is the patient deaf or have difficulty hearing?: Yes Does the patient have difficulty seeing, even when wearing glasses/contacts?: Yes Does the patient have difficulty concentrating, remembering, or making decisions?: Yes Patient able to express need for assistance with ADLs?: Yes Does the patient have difficulty dressing or bathing?: Yes Independently performs ADLs?: No Communication: Appropriate for developmental age Dressing (OT): Needs assistance Is this a change from baseline?: Change from baseline, expected to last <3days Grooming: Needs assistance Is this a change from baseline?: Change from baseline, expected to last <3 days Feeding: Independent Bathing: Needs assistance Is this a change from baseline?: Change from baseline, expected to last <3 days Toileting: Needs assistance Is this a change from baseline?: Change from baseline, expected to last <3 days In/Out Bed: Needs assistance Is this a change from baseline?: Change from baseline, expected to last <3 days Walks in Home: Independent Does the  patient have difficulty walking or climbing stairs?: Yes Weakness of Legs: Both Weakness of Arms/Hands: None  Permission Sought/Granted Permission sought to share information with : Family Supports           Permission granted to share info w Relationship: Dtr. Lenon Curt     Emotional Assessment Appearance:: Appears stated age   Affect (typically observed): Unable to Assess(Patient did not answer. Daughter, Lenon Curt provided history.) Orientation: : Oriented to Self, Oriented to Place, Oriented to  Time, Oriented to Situation Alcohol / Substance Use: Not Applicable Psych Involvement: No (comment)  Admission diagnosis:  Hyponatremia [E87.1] Elevated serum creatinine [R79.89] COVID-19 virus infection [U07.1] Patient Active Problem List   Diagnosis Date Noted  . Gastroenteritis due to COVID-19 virus 12/21/2018  . Acute renal failure superimposed on stage 3b chronic kidney disease ([place]) 12/21/2018  . Diabetes mellitus type 2, uncontrolled, with complications ([place]) [id number]  . HLD (hyperlipidemia) 12/20/2018  . Anxiety 12/20/2018  . Depression 12/20/2018  . Acute respiratory disease due to COVID-19 virus 12/17/2018  . Dehydration 12/17/2018  . Essential hypertension 12/17/2018  . Type 1 diabetes mellitus without complication ([place]) [id number]  . Diabetic ulcer of foot associated with diabetes mellitus due to underlying condition, limited to breakdown of skin ([place]) 01/11/2018  . Right hip pain 04/15/2016  . Right buttock pain 04/15/2016  . Primary osteoarthritis of right hip 04/15/2016   PCP:  Merrilee Seashore, MD Pharmacy:   [id number] - [place], [place] [address] [place] [address] Phone: [phone] Fax: [phone]  [place], [place] [facility] [address] [place] [address] [place] [address] Phone: [phone] Fax: [phone]     Social Determinants of Health ([place]) Interventions    Readmission Risk Interventions No flowsheet data found.

## 2018-12-26 DIAGNOSIS — I129 Hypertensive chronic kidney disease with stage 1 through stage 4 chronic kidney disease, or unspecified chronic kidney disease: Secondary | ICD-10-CM | POA: Diagnosis not present

## 2018-12-26 DIAGNOSIS — J9601 Acute respiratory failure with hypoxia: Secondary | ICD-10-CM | POA: Diagnosis not present

## 2018-12-26 DIAGNOSIS — U071 COVID-19: Secondary | ICD-10-CM | POA: Diagnosis not present

## 2018-12-26 DIAGNOSIS — E1140 Type 2 diabetes mellitus with diabetic neuropathy, unspecified: Secondary | ICD-10-CM | POA: Diagnosis not present

## 2018-12-26 DIAGNOSIS — J1289 Other viral pneumonia: Secondary | ICD-10-CM | POA: Diagnosis not present

## 2018-12-28 DIAGNOSIS — J9601 Acute respiratory failure with hypoxia: Secondary | ICD-10-CM | POA: Diagnosis not present

## 2018-12-28 DIAGNOSIS — I129 Hypertensive chronic kidney disease with stage 1 through stage 4 chronic kidney disease, or unspecified chronic kidney disease: Secondary | ICD-10-CM | POA: Diagnosis not present

## 2018-12-28 DIAGNOSIS — E1140 Type 2 diabetes mellitus with diabetic neuropathy, unspecified: Secondary | ICD-10-CM | POA: Diagnosis not present

## 2018-12-28 DIAGNOSIS — J1289 Other viral pneumonia: Secondary | ICD-10-CM | POA: Diagnosis not present

## 2018-12-28 DIAGNOSIS — U071 COVID-19: Secondary | ICD-10-CM | POA: Diagnosis not present

## 2019-01-01 DIAGNOSIS — J1289 Other viral pneumonia: Secondary | ICD-10-CM | POA: Diagnosis not present

## 2019-01-01 DIAGNOSIS — U071 COVID-19: Secondary | ICD-10-CM | POA: Diagnosis not present

## 2019-01-01 DIAGNOSIS — I129 Hypertensive chronic kidney disease with stage 1 through stage 4 chronic kidney disease, or unspecified chronic kidney disease: Secondary | ICD-10-CM | POA: Diagnosis not present

## 2019-01-01 DIAGNOSIS — E1140 Type 2 diabetes mellitus with diabetic neuropathy, unspecified: Secondary | ICD-10-CM | POA: Diagnosis not present

## 2019-01-01 DIAGNOSIS — J9601 Acute respiratory failure with hypoxia: Secondary | ICD-10-CM | POA: Diagnosis not present

## 2019-01-03 DIAGNOSIS — J1289 Other viral pneumonia: Secondary | ICD-10-CM | POA: Diagnosis not present

## 2019-01-03 DIAGNOSIS — I129 Hypertensive chronic kidney disease with stage 1 through stage 4 chronic kidney disease, or unspecified chronic kidney disease: Secondary | ICD-10-CM | POA: Diagnosis not present

## 2019-01-03 DIAGNOSIS — U071 COVID-19: Secondary | ICD-10-CM | POA: Diagnosis not present

## 2019-01-03 DIAGNOSIS — J9601 Acute respiratory failure with hypoxia: Secondary | ICD-10-CM | POA: Diagnosis not present

## 2019-01-03 DIAGNOSIS — E1140 Type 2 diabetes mellitus with diabetic neuropathy, unspecified: Secondary | ICD-10-CM | POA: Diagnosis not present

## 2019-01-08 DIAGNOSIS — E1121 Type 2 diabetes mellitus with diabetic nephropathy: Secondary | ICD-10-CM | POA: Diagnosis not present

## 2019-01-08 DIAGNOSIS — E782 Mixed hyperlipidemia: Secondary | ICD-10-CM | POA: Diagnosis not present

## 2019-01-08 DIAGNOSIS — E1165 Type 2 diabetes mellitus with hyperglycemia: Secondary | ICD-10-CM | POA: Diagnosis not present

## 2019-01-08 DIAGNOSIS — I129 Hypertensive chronic kidney disease with stage 1 through stage 4 chronic kidney disease, or unspecified chronic kidney disease: Secondary | ICD-10-CM | POA: Diagnosis not present

## 2019-01-09 DIAGNOSIS — J1289 Other viral pneumonia: Secondary | ICD-10-CM | POA: Diagnosis not present

## 2019-01-09 DIAGNOSIS — I129 Hypertensive chronic kidney disease with stage 1 through stage 4 chronic kidney disease, or unspecified chronic kidney disease: Secondary | ICD-10-CM | POA: Diagnosis not present

## 2019-01-09 DIAGNOSIS — J9601 Acute respiratory failure with hypoxia: Secondary | ICD-10-CM | POA: Diagnosis not present

## 2019-01-09 DIAGNOSIS — E1140 Type 2 diabetes mellitus with diabetic neuropathy, unspecified: Secondary | ICD-10-CM | POA: Diagnosis not present

## 2019-01-09 DIAGNOSIS — U071 COVID-19: Secondary | ICD-10-CM | POA: Diagnosis not present

## 2019-01-10 DIAGNOSIS — E1140 Type 2 diabetes mellitus with diabetic neuropathy, unspecified: Secondary | ICD-10-CM | POA: Diagnosis not present

## 2019-01-10 DIAGNOSIS — I129 Hypertensive chronic kidney disease with stage 1 through stage 4 chronic kidney disease, or unspecified chronic kidney disease: Secondary | ICD-10-CM | POA: Diagnosis not present

## 2019-01-10 DIAGNOSIS — J9601 Acute respiratory failure with hypoxia: Secondary | ICD-10-CM | POA: Diagnosis not present

## 2019-01-10 DIAGNOSIS — J1289 Other viral pneumonia: Secondary | ICD-10-CM | POA: Diagnosis not present

## 2019-01-10 DIAGNOSIS — U071 COVID-19: Secondary | ICD-10-CM | POA: Diagnosis not present

## 2019-01-11 DIAGNOSIS — J1289 Other viral pneumonia: Secondary | ICD-10-CM | POA: Diagnosis not present

## 2019-01-11 DIAGNOSIS — U071 COVID-19: Secondary | ICD-10-CM | POA: Diagnosis not present

## 2019-01-11 DIAGNOSIS — E1140 Type 2 diabetes mellitus with diabetic neuropathy, unspecified: Secondary | ICD-10-CM | POA: Diagnosis not present

## 2019-01-11 DIAGNOSIS — J9601 Acute respiratory failure with hypoxia: Secondary | ICD-10-CM | POA: Diagnosis not present

## 2019-01-11 DIAGNOSIS — I129 Hypertensive chronic kidney disease with stage 1 through stage 4 chronic kidney disease, or unspecified chronic kidney disease: Secondary | ICD-10-CM | POA: Diagnosis not present

## 2019-01-14 DIAGNOSIS — M25511 Pain in right shoulder: Secondary | ICD-10-CM | POA: Diagnosis not present

## 2019-01-15 NOTE — Progress Notes (Addendum)
[facility] Note  01/16/2019   CHIEF COMPLAINT Patient presents for Retina Follow Up  HISTORY OF PRESENT ILLNESS: Sue [name] is a 85 y.o. female who presents to the clinic today for:   HPI    Retina Follow Up    Patient presents with  Wet AMD.  In left eye.  This started weeks ago.  Severity is moderate.  Duration of weeks.  Since onset it is stable.  I, the attending physician,  performed the HPI with the patient and updated documentation appropriately.          Comments    Pt states her vision is stable in both eyes.  She denies eye pain or discomfort and denies any new or worsening floaters or fol.       Last edited by Bernarda Caffey, MD on 01/17/2019  1:57 PM. (History)    pt recently got out of the hospital with covid, she states her eyes are doing okay   Referring physician: Merrilee Seashore, [address],  [place] [address]  HISTORICAL INFORMATION:   Selected notes from the [facility] Referred by Dr. Quentin Ore for concern of ARMD OU    CURRENT MEDICATIONS: Current Outpatient Medications (Ophthalmic Drugs)  Medication Sig  . Polyethyl Glycol-Propyl Glycol (SYSTANE) 0.4-0.3 % SOLN Place 1 drop into both eyes 3 (three) times daily as needed (dry eyes).   No current facility-administered medications for this visit. (Ophthalmic Drugs)   Current Outpatient Medications (Other)  Medication Sig  . acetaminophen (TYLENOL) 325 MG tablet Take 2 tablets (650 mg total) by mouth every 6 (six) hours as needed for mild pain or headache (fever >/= 101).  Marland Kitchen albuterol (VENTOLIN HFA) 108 (90 Base) MCG/ACT inhaler Inhale 2 puffs into the lungs every 2 (two) hours as needed for wheezing or shortness of breath.  Marland Kitchen amLODipine (NORVASC) 10 MG tablet Take 10 mg by mouth every morning.   . B-D ULTRAFINE III SHORT PEN 31G X 8 MM MISC USE AS DIRECTED ONCE DAILY SUBCUTANEOUSLY  . calcium-vitamin D (OSCAL WITH D)  500-200 MG-UNIT tablet Take 1 tablet by mouth daily with lunch.   . dexamethasone (DECADRON) 6 MG tablet Take 1 tablet (6 mg total) by mouth daily.  . diphenoxylate-atropine (LOMOTIL) 2.5-0.025 MG tablet Take 1 tablet by mouth every 8 (eight) hours as needed for diarrhea or loose stools.   Marland Kitchen glucose blood test strip OneTouch Ultra Test strips  TEST ONCE A DAY ONCE A DAY FINGERSTICK 90  . guaifenesin (ROBITUSSIN) 100 MG/5ML syrup Take 400 mg by mouth 3 (three) times daily as needed for cough or congestion.  . hydrALAZINE (APRESOLINE) 25 MG tablet Take 3 tablets (75 mg total) by mouth every 8 (eight) hours.  . Insulin Degludec (TRESIBA FLEXTOUCH) 200 UNIT/ML SOPN Inject 36 Units into the skin at bedtime.  . insulin lispro (HUMALOG KWIKPEN) 100 UNIT/ML KwikPen Inject 0.05 mLs (5 Units total) into the skin 3 (three) times daily before meals.  . Insulin Pen Needle (PEN NEEDLES) 32G X 4 MM MISC BD Ultra-Fine Nano Pen Needle 32 gauge x 5/32"  USE AS DIRECTED DAILY  . Lancets (ONETOUCH ULTRASOFT) lancets OneTouch UltraSoft Lancets  USE TWICE DAILY  . Multiple Vitamins-Minerals (ADULT ONE DAILY GUMMIES) CHEW Chew 2 tablets by mouth daily with lunch. Centrum  . Multiple Vitamins-Minerals (AIRBORNE) TBEF Take 1 tablet by mouth daily with lunch.  . Multiple Vitamins-Minerals (PRESERVISION AREDS 2) CAPS Take 1  capsule by mouth 2 (two) times daily.  . nortriptyline (PAMELOR) 25 MG capsule Take 1 capsule (25 mg total) by mouth at bedtime.  . Omega-3 Fatty Acids (FISH OIL) 1200 MG CAPS Take 1,200 mg by mouth daily with lunch.   Marland Kitchen omeprazole (PRILOSEC) 20 MG capsule Take 20 mg by mouth every morning.  . ondansetron (ZOFRAN) 4 MG tablet Take 4 mg by mouth 2 (two) times daily as needed for nausea or vomiting.   . ONE TOUCH ULTRA TEST test strip   . PRESCRIPTION MEDICATION by Intravitreal route every 30 (thirty) days. Ophthalmic injection at MD office in left eye  . simvastatin (ZOCOR) 20 MG tablet Take 20 mg by  mouth at bedtime.   . vitamin B-12 (CYANOCOBALAMIN) 1000 MCG tablet Take 1,000 mcg by mouth daily with lunch.  . zolpidem (AMBIEN) 5 MG tablet Take 1 tablet (5 mg total) by mouth at bedtime as needed for sleep.   Current Facility-Administered Medications (Other)  Medication Route  . Bevacizumab (AVASTIN) SOLN 1.25 mg Intravitreal  . Bevacizumab (AVASTIN) SOLN 1.25 mg Intravitreal      REVIEW OF SYSTEMS: ROS    Positive for: Endocrine, Eyes   Negative for: Constitutional, Gastrointestinal, Neurological, Skin, Genitourinary, Musculoskeletal, HENT, Cardiovascular, Respiratory, Psychiatric, Allergic/Imm, Heme/Lymph   Last edited by Doneen Poisson on 01/16/2019  1:25 PM. (History)       ALLERGIES No Known Allergies  PAST MEDICAL HISTORY Past Medical History:  Diagnosis Date  . Basal cell carcinoma 11/23/2017   left elbow, right neck  . Diabetes mellitus without complication ([place])   . Hypertension   . Hypertensive retinopathy    OU  . Macular degeneration    OU  . SCC (squamous cell carcinoma) 11/23/2017   right forehead   Past Surgical History:  Procedure Laterality Date  . ABDOMINAL HYSTERECTOMY    . CATARACT EXTRACTION    . CHOLECYSTECTOMY    . EYE SURGERY    . SPINE SURGERY      FAMILY HISTORY History reviewed. No pertinent family history.  SOCIAL HISTORY Social History   Tobacco Use  . Smoking status: Former Research scientist (life sciences)  . Smokeless tobacco: Never Used  Substance Use Topics  . Alcohol use: No    Alcohol/week: 0.0 standard drinks  . Drug use: No         OPHTHALMIC EXAM:  Base Eye Exam    Visual Acuity (Snellen - Linear)      Right Left   Dist [place] 20/40 -1 20/40 -1   Dist ph [place] NI NI   Correction: Glasses       Tonometry (Tonopen, 1:21 PM)      Right Left   Pressure 20 18       Pupils      Dark Light Shape React APD   Right 4 3 Round Brisk 0   Left 4 3 Round Brisk 0       Visual Fields      Left Right    Full Full       Extraocular  Movement      Right Left    Full Full       Neuro/Psych    Oriented x3: Yes   Mood/Affect: Normal       Dilation    Both eyes: 1.0% Mydriacyl, 2.5% Phenylephrine @ 1:21 PM        Slit Lamp and Fundus Exam    Slit Lamp Exam      Right Left  Lids/Lashes Dermatochalasis - upper lid, Telangiectasia, mild MGD Dermatochalasis - upper lid, Telangiectasia, Meibomian gland dysfunction   Conjunctiva/Sclera White and quiet White and quiet   Cornea Temporal Well healed cataract wounds, mild Arcus, trace endo pigment nasally, 1+PEE Arcus, Temporal Well healed cataract wounds, 1-2+ PEE   Anterior Chamber Deep and quiet Deep and quiet   Iris Round and moderately dilated to 4.39mm Round and moderately dilated to 16mm, Iris atrophy from 0100 to 0230, peripheral irodotimy at 0200   Lens 3-piece PC IOL in good position PC IOL in good position   Vitreous Vitreous syneresis, Posterior vitreous detachment, Weiss ring Vitreous syneresis, PVD, vitreous condensations       Fundus Exam      Right Left   Disc 360 Peripapillary atrophy, diffuse mild pallor, compact, sharp rim mild pallor, Peripapillary atrophy and pigmentation   C/D Ratio 0.2 0.3   Macula Blunted foveal reflex, +PED, RPE mottling and clumping, Drusen, No heme or edema Blunted foveal reflex, +low lying PED / +CNVM with recurrence of SRF, RPE mottling and clumping, Drusen, No heme or edema,    Vessels Vascular attenuation Vascular attenuation   Periphery Attached, 360 Reticular degeneration Attached, 360 Reticular degeneration        Refraction    Wearing Rx      Sphere Cylinder Axis   Right -1.50 +0.75 180   Left -0.50 +0.75 180          IMAGING AND PROCEDURES  Imaging and Procedures for @TODAY @  OCT, Retina - OU - Both Eyes       Right Eye Quality was good. Central Foveal Thickness: 235. Progression has been stable. Findings include normal foveal contour, no IRF, no SRF, pigment epithelial detachment, outer retinal atrophy,  retinal drusen .   Left Eye Quality was good. Central Foveal Thickness: 281. Progression has worsened. Findings include no IRF, retinal drusen , pigment epithelial detachment, outer retinal atrophy, epiretinal membrane, abnormal foveal contour, no SRF (Interval recurrence of SRF overlying PED).   Notes *Images captured and stored on drive  Diagnosis / Impression:  OD: Non-Exudative ARMD -- stable OS: Exudative ARMD -- Interval recurrence of SRF overlying PED  Clinical management:  See below  Abbreviations: NFP - Normal foveal profile. CME - cystoid macular edema. PED - pigment epithelial detachment. IRF - intraretinal fluid. SRF - subretinal fluid. EZ - ellipsoid zone. ERM - epiretinal membrane. ORA - outer retinal atrophy. ORT - outer retinal tubulation. SRHM - subretinal hyper-reflective material         Intravitreal Injection, Pharmacologic Agent - OS - Left Eye       Time Out 01/16/2019. 1:31 PM. Confirmed correct patient, procedure, site, and patient consented.   Anesthesia Topical anesthesia was used. Anesthetic medications included Lidocaine 2%, Proparacaine 0.5%.   Procedure Preparation included 5% betadine to ocular surface, eyelid speculum. A supplied needle was used.   Injection:  1.25 mg Bevacizumab (AVASTIN) SOLN   NDC: [id number], Lot: [id number] , Expiration date: 02/28/2019   Route: Intravitreal, Site: Left Eye, Waste: 0 mL  Post-op Post injection exam found visual acuity of at least counting fingers. The patient tolerated the procedure well. There were no complications. The patient received written and verbal post procedure care education.                 ASSESSMENT/PLAN:    ICD-10-CM   1. Exudative age-related macular degeneration of left eye with active choroidal neovascularization (HCC)  H35.3221 Intravitreal Injection, Pharmacologic Agent - OS -  Left Eye    Bevacizumab (AVASTIN) SOLN 1.25 mg  2. Retinal edema  H35.81 OCT, Retina - OU - Both  Eyes  3. 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 Diabetes mellitus type 2 without retinopathy ([place])  E11.9   5. Hypertensive retinopathy of both eyes  H35.033   6. Essential hypertension  I10   7. Pseudophakia of both eyes  Z96.1    1,2. Exudative age-related macular degeneration, left eye  - conversion of OS from nonexudative to exudative ARMD prior to 02.05.20 visit  - s/p IVA OS #1 (02.05.20), #2 (03.04.20), #3 (04.30.20), #4 (06.02.20), #5 (07.14.20), #6 (9.1.20), #7 (10.27.20)  - OCT shows interval increase in SRF overlying PED with extension to 8 wks  - BCVA stable at 20/40 OS  - recommend IVA #8 OS today, 12.22.20, with decrease in interval to 6 weeks  - pt wishes to be treated with IVA OS  - risks and benefits of intravitreal avastin discussed with patient  - informed consent obtained and signed  - see procedure note  - f/u in 6 wks for DFE, OCT, likely injection OS   3. Age related macular degeneration, non-exudative, right eye  - stable, former pt of Dr. Zigmund Daniel -- no significant change in OCT or exam  - The incidence, anatomy, and pathology of dry AMD, risk of progression, and the AREDS and AREDS 2 study including smoking risks discussed with patient.  -  recommend amsler grid monitoring  4. Diabetes mellitus, type 2 without retinopathy  - The incidence, risk factors for progression, natural history and treatment options for diabetic retinopathy  were discussed with patient.    - The need for close monitoring of blood glucose, blood pressure, and serum lipids, avoiding cigarette or any type of tobacco, and the need for long term follow up was also discussed with patient.  - f/u 1 year   5,6. Hypertensive retinopathy OU  - discussed importance of tight BP control  - monitor  7. Pseudophakia OU  - s/p CE/IOL OU  - beautiful surgeries, doing well  - monitor  Ophthalmic Meds Ordered this visit:  Meds ordered this  encounter  Medications  . Bevacizumab (AVASTIN) SOLN 1.25 mg   Return in about 6 weeks (around 02/27/2019) for f/u exu ARMD OS, DFE, OCT.  There are no Patient Instructions on file for this visit.  Explained the diagnoses, plan, and follow up with the patient and they expressed understanding.  Patient expressed understanding of the importance of proper follow up care.   This document serves as a record of services personally performed by Gardiner Sleeper, MD, PhD. It was created on their behalf by Estill Bakes, COT an ophthalmic technician. The creation of this record is the provider's dictation and/or activities during the visit.    Electronically signed by: Estill Bakes, COT 01/15/19 @ 2:00 PM   This document serves as a record of services personally performed by Gardiner Sleeper, MD, PhD. It was created on their behalf by Ernest Mallick, OA, an ophthalmic assistant. The creation of this record is the provider's dictation and/or activities during the visit.    Electronically signed by: Ernest Mallick, OA 12.22.2020 2:00 PM  Gardiner Sleeper, M.D., Ph.D. Diseases & Surgery of the Retina and [place] 01/16/2019   I have reviewed the above documentation for accuracy and completeness, and I agree with the above. Gardiner Sleeper, M.D., Ph.D. 01/17/19  2:00 PM    Abbreviations: M myopia (nearsighted); A astigmatism; H hyperopia (farsighted); P presbyopia; Mrx spectacle prescription;  CTL contact lenses; OD right eye; OS left eye; OU both eyes  XT exotropia; ET esotropia; PEK punctate epithelial keratitis; PEE punctate epithelial erosions; DES dry eye syndrome; MGD meibomian gland dysfunction; ATs artificial tears; PFAT's preservative free artificial tears; [place] nuclear sclerotic cataract; PSC posterior subcapsular cataract; ERM epi-retinal membrane; PVD posterior vitreous detachment; RD retinal detachment; DM diabetes mellitus; DR diabetic retinopathy; NPDR  non-proliferative diabetic retinopathy; PDR proliferative diabetic retinopathy; CSME clinically significant macular edema; DME diabetic macular edema; dbh dot blot hemorrhages; CWS cotton wool spot; POAG primary open angle glaucoma; C/D cup-to-disc ratio; HVF humphrey visual field; GVF goldmann visual field; OCT optical coherence tomography; IOP intraocular pressure; BRVO Branch retinal vein occlusion; CRVO central retinal vein occlusion; CRAO central retinal artery occlusion; BRAO branch retinal artery occlusion; RT retinal tear; SB scleral buckle; PPV pars plana vitrectomy; VH Vitreous hemorrhage; PRP panretinal laser photocoagulation; IVK intravitreal kenalog; VMT vitreomacular traction; MH Macular hole;  NVD neovascularization of the disc; NVE neovascularization elsewhere; AREDS age related eye disease study; ARMD age related macular degeneration; POAG primary open angle glaucoma; EBMD epithelial/anterior basement membrane dystrophy; ACIOL anterior chamber intraocular lens; IOL intraocular lens; PCIOL posterior chamber intraocular lens; Phaco/IOL phacoemulsification with intraocular lens placement; [place] photorefractive keratectomy; LASIK laser assisted in situ keratomileusis; HTN hypertension; DM diabetes mellitus; COPD chronic obstructive pulmonary disease

## 2019-01-16 ENCOUNTER — Other Ambulatory Visit: Payer: Self-pay

## 2019-01-16 ENCOUNTER — Ambulatory Visit (INDEPENDENT_AMBULATORY_CARE_PROVIDER_SITE_OTHER): Payer: Medicare Other | Admitting: Ophthalmology

## 2019-01-16 DIAGNOSIS — H353221 Exudative age-related macular degeneration, left eye, with active choroidal neovascularization: Secondary | ICD-10-CM | POA: Diagnosis not present

## 2019-01-16 DIAGNOSIS — Z961 Presence of intraocular lens: Secondary | ICD-10-CM

## 2019-01-16 DIAGNOSIS — H3581 Retinal edema: Secondary | ICD-10-CM | POA: Diagnosis not present

## 2019-01-16 DIAGNOSIS — I10 Essential (primary) hypertension: Secondary | ICD-10-CM

## 2019-01-16 DIAGNOSIS — H35033 Hypertensive retinopathy, bilateral: Secondary | ICD-10-CM | POA: Diagnosis not present

## 2019-01-16 DIAGNOSIS — H353112 Nonexudative age-related macular degeneration, right eye, intermediate dry stage: Secondary | ICD-10-CM

## 2019-01-16 DIAGNOSIS — E119 Type 2 diabetes mellitus without complications: Secondary | ICD-10-CM | POA: Diagnosis not present

## 2019-01-17 ENCOUNTER — Encounter (INDEPENDENT_AMBULATORY_CARE_PROVIDER_SITE_OTHER): Payer: Self-pay | Admitting: Ophthalmology

## 2019-01-17 DIAGNOSIS — J9601 Acute respiratory failure with hypoxia: Secondary | ICD-10-CM | POA: Diagnosis not present

## 2019-01-17 DIAGNOSIS — I129 Hypertensive chronic kidney disease with stage 1 through stage 4 chronic kidney disease, or unspecified chronic kidney disease: Secondary | ICD-10-CM | POA: Diagnosis not present

## 2019-01-17 DIAGNOSIS — U071 COVID-19: Secondary | ICD-10-CM | POA: Diagnosis not present

## 2019-01-17 DIAGNOSIS — J1289 Other viral pneumonia: Secondary | ICD-10-CM | POA: Diagnosis not present

## 2019-01-17 DIAGNOSIS — E1140 Type 2 diabetes mellitus with diabetic neuropathy, unspecified: Secondary | ICD-10-CM | POA: Diagnosis not present

## 2019-01-17 MED ORDER — BEVACIZUMAB CHEMO INJECTION 1.25MG/0.05ML SYRINGE FOR KALEIDOSCOPE
1.2500 mg | INTRAVITREAL | Status: AC | PRN
  Administered 2019-01-17: 1.25 mg via INTRAVITREAL

## 2019-01-22 DIAGNOSIS — U071 COVID-19: Secondary | ICD-10-CM | POA: Diagnosis not present

## 2019-01-29 ENCOUNTER — Other Ambulatory Visit: Payer: Self-pay

## 2019-01-31 DIAGNOSIS — J9601 Acute respiratory failure with hypoxia: Secondary | ICD-10-CM | POA: Diagnosis not present

## 2019-01-31 DIAGNOSIS — I129 Hypertensive chronic kidney disease with stage 1 through stage 4 chronic kidney disease, or unspecified chronic kidney disease: Secondary | ICD-10-CM | POA: Diagnosis not present

## 2019-01-31 DIAGNOSIS — E1140 Type 2 diabetes mellitus with diabetic neuropathy, unspecified: Secondary | ICD-10-CM | POA: Diagnosis not present

## 2019-02-02 DIAGNOSIS — E1165 Type 2 diabetes mellitus with hyperglycemia: Secondary | ICD-10-CM | POA: Diagnosis not present

## 2019-02-02 DIAGNOSIS — R600 Localized edema: Secondary | ICD-10-CM | POA: Diagnosis not present

## 2019-02-07 ENCOUNTER — Other Ambulatory Visit: Payer: Self-pay

## 2019-02-07 ENCOUNTER — Ambulatory Visit: Payer: Medicare Other | Admitting: Podiatry

## 2019-02-07 ENCOUNTER — Encounter: Payer: Self-pay | Admitting: Podiatry

## 2019-02-07 DIAGNOSIS — B351 Tinea unguium: Secondary | ICD-10-CM | POA: Diagnosis not present

## 2019-02-07 DIAGNOSIS — E1142 Type 2 diabetes mellitus with diabetic polyneuropathy: Secondary | ICD-10-CM | POA: Diagnosis not present

## 2019-02-07 DIAGNOSIS — M79674 Pain in right toe(s): Secondary | ICD-10-CM

## 2019-02-07 DIAGNOSIS — E1121 Type 2 diabetes mellitus with diabetic nephropathy: Secondary | ICD-10-CM | POA: Diagnosis not present

## 2019-02-07 DIAGNOSIS — E782 Mixed hyperlipidemia: Secondary | ICD-10-CM | POA: Diagnosis not present

## 2019-02-07 DIAGNOSIS — L84 Corns and callosities: Secondary | ICD-10-CM

## 2019-02-07 DIAGNOSIS — E1165 Type 2 diabetes mellitus with hyperglycemia: Secondary | ICD-10-CM | POA: Diagnosis not present

## 2019-02-07 DIAGNOSIS — M79675 Pain in left toe(s): Secondary | ICD-10-CM | POA: Diagnosis not present

## 2019-02-07 DIAGNOSIS — D508 Other iron deficiency anemias: Secondary | ICD-10-CM | POA: Diagnosis not present

## 2019-02-07 NOTE — Patient Instructions (Signed)

## 2019-02-13 NOTE — Progress Notes (Signed)
Subjective: Sue [name] presents today with history of diabetic neuropathy. Patient seen for follow up of chronic, painful mycotic toenails and corn and calluses which interfere with daily activities and routine tasks.  Pain is aggravated when wearing enclosed shoe gear. Pain is getting progressively worse and relieved with periodic professional debridement.   Her daughter is present during the visit.  They voice no new pedal concerns on today's visit.  Merrilee Seashore, MD is her PCP. Last visit was 08/23/2018.  Current Outpatient Medications on File Prior to Visit  Medication Sig Dispense Refill  . acetaminophen (TYLENOL) 325 MG tablet Take 2 tablets (650 mg total) by mouth every 6 (six) hours as needed for mild pain or headache (fever >/= 101). 10 tablet 0  . albuterol (VENTOLIN HFA) 108 (90 Base) MCG/ACT inhaler Inhale 2 puffs into the lungs every 2 (two) hours as needed for wheezing or shortness of breath. 18 g 0  . amLODipine (NORVASC) 10 MG tablet Take 10 mg by mouth every morning.     . B-D ULTRAFINE III SHORT PEN 31G X 8 MM MISC USE AS DIRECTED ONCE DAILY SUBCUTANEOUSLY  11  . calcium-vitamin D (OSCAL WITH D) 500-200 MG-UNIT tablet Take 1 tablet by mouth daily with lunch.     . dexamethasone (DECADRON) 6 MG tablet Take 1 tablet (6 mg total) by mouth daily. 5 tablet 0  . diphenoxylate-atropine (LOMOTIL) 2.5-0.025 MG tablet Take 1 tablet by mouth every 8 (eight) hours as needed for diarrhea or loose stools.     Marland Kitchen glucose blood test strip OneTouch Ultra Test strips  TEST ONCE A DAY ONCE A DAY FINGERSTICK 90    . guaifenesin (ROBITUSSIN) 100 MG/5ML syrup Take 400 mg by mouth 3 (three) times daily as needed for cough or congestion.    . hydrALAZINE (APRESOLINE) 25 MG tablet Take 3 tablets (75 mg total) by mouth every 8 (eight) hours. 270 tablet 0  . Insulin Degludec (TRESIBA FLEXTOUCH) 200 UNIT/ML SOPN Inject 36 Units into the skin at bedtime. 3 pen 0  . insulin lispro (HUMALOG  KWIKPEN) 100 UNIT/ML KwikPen Inject 0.05 mLs (5 Units total) into the skin 3 (three) times daily before meals. 15 mL 0  . Insulin Pen Needle (PEN NEEDLES) 32G X 4 MM MISC BD Ultra-Fine Nano Pen Needle 32 gauge x 5/32"  USE AS DIRECTED DAILY    . Lancets (ONETOUCH ULTRASOFT) lancets OneTouch UltraSoft Lancets  USE TWICE DAILY    . meloxicam (MOBIC) 7.5 MG tablet Take 7.5 mg by mouth daily.    . Multiple Vitamins-Minerals (ADULT ONE DAILY GUMMIES) CHEW Chew 2 tablets by mouth daily with lunch. Centrum    . Multiple Vitamins-Minerals (AIRBORNE) TBEF Take 1 tablet by mouth daily with lunch.    . Multiple Vitamins-Minerals (PRESERVISION AREDS 2) CAPS Take 1 capsule by mouth 2 (two) times daily.    . nortriptyline (PAMELOR) 25 MG capsule Take 1 capsule (25 mg total) by mouth at bedtime. 30 capsule 0  . Omega-3 Fatty Acids (FISH OIL) 1200 MG CAPS Take 1,200 mg by mouth daily with lunch.     Marland Kitchen omeprazole (PRILOSEC) 20 MG capsule Take 20 mg by mouth every morning.    . ondansetron (ZOFRAN) 4 MG tablet Take 4 mg by mouth 2 (two) times daily as needed for nausea or vomiting.     . ONE TOUCH ULTRA TEST test strip     . Polyethyl Glycol-Propyl Glycol (SYSTANE) 0.4-0.3 % SOLN Place 1 drop into both eyes  3 (three) times daily as needed (dry eyes).    Marland Kitchen PRESCRIPTION MEDICATION by Intravitreal route every 30 (thirty) days. Ophthalmic injection at MD office in left eye    . simvastatin (ZOCOR) 20 MG tablet Take 20 mg by mouth at bedtime.     . traMADol (ULTRAM) 50 MG tablet Take 50 mg by mouth every 8 (eight) hours as needed.    . vitamin B-12 (CYANOCOBALAMIN) 1000 MCG tablet Take 1,000 mcg by mouth daily with lunch.    . zolpidem (AMBIEN) 5 MG tablet Take 1 tablet (5 mg total) by mouth at bedtime as needed for sleep. 10 tablet 0   Current Facility-Administered Medications on File Prior to Visit  Medication Dose Route Frequency Provider Last Rate Last Admin  . Bevacizumab (AVASTIN) SOLN 1.25 mg  1.25 mg  Intravitreal  Bernarda Caffey, MD   1.25 mg at 03/01/18 1344  . Bevacizumab (AVASTIN) SOLN 1.25 mg  1.25 mg Intravitreal  Bernarda Caffey, MD   1.25 mg at 03/31/18 1449    No Known Allergies  Objective: There were no vitals filed for this visit.  Vascular Examination: Capillary refill time to digits immediate b/l.    Dorsalis pedis pulses present b/l.  Posterior tibial pulses present b/l.  No digital hair x 10 digits.  Skin temperature WNL b/l.  Dermatological Examination: Skin with normal turgor, texture and tone b/l.  Toenails 1-5 b/l discolored, thick, dystrophic with subungual debris and pain with palpation to nailbeds due to thickness of nails.  Hyperkeratotic lesion(s) distal tip and dorsal PIPJ right 2nd digit. No erythema, no edema, no drainage, no flocculence noted.   Plantar callus resolved plantar aspect right 2nd digit.  Musculoskeletal: Muscle strength 5/5 to all LE muscle groups.  Rigid hammertoe deformity right 2nd digit.  Neurological: Sensation diminished with 10 gram monofilament.  Vibratory sensation diminished b/l.  Assessment: 1. Painful onychomycosis toenails 1-5 b/l 2. Corn distal tip and dorsal PIPJ right 2nd digit 3. NIDDM with neuropathy  Plan: 1. Continue diabetic foot care principles. 2. Toenails 1-5 b/l were debrided in length and girth without iatrogenic bleeding. 3. Corn(s) right 2nd digit ared utilizing sterile scalpel blade without incident.  4. Patient to continue soft, supportive shoe gear daily. 5. Patient to report any pedal injuries to medical professional immediately. 6. Follow up 3 months.  7. Patient/POA to call should there be a concern in the interim.

## 2019-02-14 ENCOUNTER — Other Ambulatory Visit: Payer: Self-pay

## 2019-02-14 ENCOUNTER — Ambulatory Visit: Payer: Medicare Other | Admitting: Family Medicine

## 2019-02-14 ENCOUNTER — Encounter: Payer: Self-pay | Admitting: Family Medicine

## 2019-02-14 ENCOUNTER — Ambulatory Visit: Payer: Self-pay

## 2019-02-14 DIAGNOSIS — E782 Mixed hyperlipidemia: Secondary | ICD-10-CM | POA: Diagnosis not present

## 2019-02-14 DIAGNOSIS — I129 Hypertensive chronic kidney disease with stage 1 through stage 4 chronic kidney disease, or unspecified chronic kidney disease: Secondary | ICD-10-CM | POA: Diagnosis not present

## 2019-02-14 DIAGNOSIS — M25511 Pain in right shoulder: Secondary | ICD-10-CM

## 2019-02-14 DIAGNOSIS — E1165 Type 2 diabetes mellitus with hyperglycemia: Secondary | ICD-10-CM | POA: Diagnosis not present

## 2019-02-14 DIAGNOSIS — E1121 Type 2 diabetes mellitus with diabetic nephropathy: Secondary | ICD-10-CM | POA: Diagnosis not present

## 2019-02-14 DIAGNOSIS — E1142 Type 2 diabetes mellitus with diabetic polyneuropathy: Secondary | ICD-10-CM | POA: Diagnosis not present

## 2019-02-14 NOTE — Progress Notes (Signed)
Office Visit Note   Patient: Sue [name]           Date of Birth: 1932-09-26           MRN: [id number] Visit Date: 02/14/2019 Requested by: Merrilee Seashore, [address] [place],  [place] [address] PCP: Merrilee Seashore, MD  Subjective: Chief Complaint  Patient presents with  . Right Shoulder - Pain    Felt a pop in the shoulder 2 months ago, while having therapy while in the hospital for covid. Then, last night she fell, landing on her right shoulder.     HPI: She is here with right shoulder pain.  2 months ago she was hospitalized with COVID-19.  After discharge to home, she was working with a physical therapist to regain strength.  During one of her exercise maneuver she felt something pop in her shoulder.  It was bothering her but not severely.  She went to a different clinic and had x-rays taken which were apparently unremarkable.  This morning she got out of bed to use the bedside commode, she lost her balance and fell landing directly on the right shoulder.  It has been hurting much more since her fall.  Pain seems to be mostly on the anterior shoulder.              ROS:   All other systems were reviewed and are negative.  Objective: Vital Signs: There were no vitals taken for this visit.  Physical Exam:  General:  Alert and oriented, in no acute distress. Pulm:  Breathing unlabored. Psy:  Normal mood, congruent affect. Skin: No visible bruising. Right shoulder: She has full active range of motion with pain at the extremes.  No tenderness to palpation of the lap chole, AC joint, posterior scapula or proximal humerus.  Isometric rotator cuff strength is 5/5 in all directions but she does have a little bit of pain with empty can test.  Speeds test is also painful.  Imaging: X-rays right shoulder: Moderate AC joint DJD.  Anatomic alignment of the shoulder.  No obvious fracture seen.    Assessment & Plan: 1.  Right shoulder pain, possibly partial  rotator cuff tear. -She has a sling at home to use for the next couple days.  If not feeling better next week she will call me and I will refer her to physical therapy.  Tramadol as needed for pain. -If she fails conservative management we will do additional imaging to look for rotator cuff tear.     Procedures: No procedures performed  No notes on file     PMFS History: Patient Active Problem List   Diagnosis Date Noted  . Gastroenteritis due to COVID-19 virus 12/21/2018  . Acute renal failure superimposed on stage 3b chronic kidney disease ([place]) 12/21/2018  . Diabetes mellitus type 2, uncontrolled, with complications ([place]) [id number]  . HLD (hyperlipidemia) 12/20/2018  . Anxiety 12/20/2018  . Depression 12/20/2018  . Acute respiratory disease due to COVID-19 virus 12/17/2018  . Dehydration 12/17/2018  . Essential hypertension 12/17/2018  . Type 1 diabetes mellitus without complication ([place]) [id number]  . Diabetic ulcer of foot associated with diabetes mellitus due to underlying condition, limited to breakdown of skin ([place]) 01/11/2018  . Right hip pain 04/15/2016  . Right buttock pain 04/15/2016  . Primary osteoarthritis of right hip 04/15/2016   Past Medical History:  Diagnosis Date  . Basal cell carcinoma 11/23/2017   left elbow, right neck  . Diabetes  mellitus without complication ([place])   . Hypertension   . Hypertensive retinopathy    OU  . Macular degeneration    OU  . SCC (squamous cell carcinoma) 11/23/2017   right forehead    History reviewed. No pertinent family history.  Past Surgical History:  Procedure Laterality Date  . ABDOMINAL HYSTERECTOMY    . CATARACT EXTRACTION    . CHOLECYSTECTOMY    . EYE SURGERY    . SPINE SURGERY     Social History   Occupational History  . Not on file  Tobacco Use  . Smoking status: Former Research scientist (life sciences)  . Smokeless tobacco: Never Used  Substance and Sexual Activity  . Alcohol use: No    Alcohol/week: 0.0 standard drinks    . Drug use: No  . Sexual activity: Not on file

## 2019-02-15 ENCOUNTER — Telehealth: Payer: Self-pay | Admitting: Family Medicine

## 2019-02-15 MED ORDER — MELOXICAM 7.5 MG PO TABS: 7.5000 mg | ORAL_TABLET | Freq: Every day | ORAL | 3 refills | Status: AC | PRN

## 2019-02-15 MED ORDER — TRAMADOL HCL 50 MG PO TABS: 50.0000 mg | ORAL_TABLET | Freq: Two times a day (BID) | ORAL | 0 refills | Status: AC | PRN

## 2019-02-15 NOTE — Telephone Encounter (Signed)
I called and left this information on the patient's mobile voice mail.

## 2019-02-15 NOTE — Telephone Encounter (Signed)
Please advise 

## 2019-02-15 NOTE — Telephone Encounter (Signed)
Patient called and requesting refill as follow:  Meloxicam and Tramadol medications. Pharmacy on file is  [facility] on [address] [place]. Patient phone number is [phone].

## 2019-02-15 NOTE — Telephone Encounter (Signed)
Sent!

## 2019-02-22 NOTE — Progress Notes (Signed)
[facility] Note  02/28/2019   CHIEF COMPLAINT Patient presents for Retina Follow Up  HISTORY OF PRESENT ILLNESS: Sue [name] is a 86 y.o. female who presents to the clinic today for:   HPI    Retina Follow Up    Patient presents with  Wet AMD.  In left eye.  This started months ago.  Severity is moderate.  Duration of 6 weeks.  Since onset it is stable.  I, the attending physician,  performed the HPI with the patient and updated documentation appropriately.          Comments    86 y/o female pt here for 6 wk f/u for wet ARMD OS.  No change in [place] OU.  Denies pain, flashes, floaters.  BS 133 yesterday.  A1C 6.0 2 wks ago.  Systane prn OU.       Last edited by Bernarda Caffey, MD on 02/28/2019  1:55 PM. (History)    pt states her vision is the same as last visit   Referring physician: Merrilee Seashore, [address] [place],  [place] [address]  HISTORICAL INFORMATION:   Selected notes from the MEDICAL RECORD NUMBER Referred by Dr. Quentin Ore for concern of ARMD OU    CURRENT MEDICATIONS: Current Outpatient Medications (Ophthalmic Drugs)  Medication Sig  . Polyethyl Glycol-Propyl Glycol (SYSTANE) 0.4-0.3 % SOLN Place 1 drop into both eyes 3 (three) times daily as needed (dry eyes).   No current facility-administered medications for this visit. (Ophthalmic Drugs)   Current Outpatient Medications (Other)  Medication Sig  . acetaminophen (TYLENOL) 325 MG tablet Take 2 tablets (650 mg total) by mouth every 6 (six) hours as needed for mild pain or headache (fever >/= 101).  Marland Kitchen amLODipine (NORVASC) 10 MG tablet Take 10 mg by mouth every morning.   . B-D ULTRAFINE III SHORT PEN 31G X 8 MM MISC USE AS DIRECTED ONCE DAILY SUBCUTANEOUSLY  . calcium-vitamin D (OSCAL WITH D) 500-200 MG-UNIT tablet Take 1 tablet by mouth daily with lunch.   Marland Kitchen glucose blood test strip OneTouch Ultra Test strips  TEST ONCE A DAY ONCE A DAY FINGERSTICK 90   . hydrALAZINE (APRESOLINE) 25 MG tablet Take 3 tablets (75 mg total) by mouth every 8 (eight) hours.  . hydrochlorothiazide (HYDRODIURIL) 25 MG tablet Take 25 mg by mouth daily.  . Insulin Degludec (TRESIBA FLEXTOUCH) 200 UNIT/ML SOPN Inject 36 Units into the skin at bedtime.  . insulin lispro (HUMALOG KWIKPEN) 100 UNIT/ML KwikPen Inject 0.05 mLs (5 Units total) into the skin 3 (three) times daily before meals.  . Insulin Pen Needle (PEN NEEDLES) 32G X 4 MM MISC BD Ultra-Fine Nano Pen Needle 32 gauge x 5/32"  USE AS DIRECTED DAILY  . Lancets (ONETOUCH ULTRASOFT) lancets OneTouch UltraSoft Lancets  USE TWICE DAILY  . meloxicam (MOBIC) 7.5 MG tablet Take 1 tablet (7.5 mg total) by mouth daily as needed for pain.  . Multiple Vitamins-Minerals (ADULT ONE DAILY GUMMIES) CHEW Chew 2 tablets by mouth daily with lunch. Centrum  . Multiple Vitamins-Minerals (A[place]NE) TBEF Take 1 tablet by mouth daily with lunch.  . Multiple Vitamins-Minerals (PRESERVISION AREDS 2) CAPS Take 1 capsule by mouth 2 (two) times daily.  . nortriptyline (PAMELOR) 25 MG capsule Take 1 capsule (25 mg total) by mouth at bedtime.  . Omega-3 Fatty Acids (FISH OIL) 1200 MG CAPS Take 1,200 mg by mouth daily with lunch.   Marland Kitchen omeprazole (PRILOSEC) 20 MG  capsule Take 20 mg by mouth every morning.  . ONE TOUCH ULTRA TEST test strip   . PRESCRIPTION MEDICATION by Intravitreal route every 30 (thirty) days. Ophthalmic injection at MD office in left eye  . simvastatin (ZOCOR) 20 MG tablet Take 20 mg by mouth at bedtime.   . traMADol (ULTRAM) 50 MG tablet Take 1 tablet (50 mg total) by mouth 2 (two) times daily as needed.  . vitamin B-12 (CYANOCOBALAMIN) 1000 MCG tablet Take 1,000 mcg by mouth daily with lunch.   Current Facility-Administered Medications (Other)  Medication Route  . Bevacizumab (AVASTIN) SOLN 1.25 mg Intravitreal  . Bevacizumab (AVASTIN) SOLN 1.25 mg Intravitreal      REVIEW OF SYSTEMS: ROS    Positive for:  Genitourinary, Endocrine, Eyes   Negative for: Constitutional, Gastrointestinal, Neurological, Skin, Musculoskeletal, HENT, Cardiovascular, Respiratory, Psychiatric, Allergic/Imm, Heme/Lymph   Last edited by Matthew Folks, COA on 02/28/2019  1:48 PM. (History)       ALLERGIES No Known Allergies  PAST MEDICAL HISTORY Past Medical History:  Diagnosis Date  . Basal cell carcinoma 11/23/2017   left elbow, right neck  . Diabetes mellitus without complication ([place])   . Hypertension   . Hypertensive retinopathy    OU  . Macular degeneration    OU  . SCC (squamous cell carcinoma) 11/23/2017   right forehead   Past Surgical History:  Procedure Laterality Date  . ABDOMINAL HYSTERECTOMY    . CATARACT EXTRACTION    . CHOLECYSTECTOMY    . EYE SURGERY    . SPINE SURGERY      FAMILY HISTORY History reviewed. No pertinent family history.  SOCIAL HISTORY Social History   Tobacco Use  . Smoking status: Former Research scientist (life sciences)  . Smokeless tobacco: Never Used  Substance Use Topics  . Alcohol use: No    Alcohol/week: 0.0 standard drinks  . Drug use: No         OPHTHALMIC EXAM:  Base Eye Exam    Visual Acuity (Snellen - Linear)      Right Left   Dist cc 20/40 20/40 +   Dist ph cc NI NI   Correction: Glasses       Tonometry (Tonopen, 1:50 PM)      Right Left   Pressure 15 13       Pupils      Dark Light Shape React APD   Right 4 3 Round Brisk None   Left 4 3 Round Brisk None       Visual Fields (Counting fingers)      Left Right    Full Full       Extraocular Movement      Right Left    Full, Ortho Full, Ortho       Neuro/Psych    Oriented x3: Yes   Mood/Affect: Normal       Dilation    Both eyes: 1.0% Mydriacyl, 2.5% Phenylephrine @ 1:50 PM        Slit Lamp and Fundus Exam    Slit Lamp Exam      Right Left   Lids/Lashes Dermatochalasis - upper lid, Telangiectasia, mild MGD Dermatochalasis - upper lid, Telangiectasia, Meibomian gland dysfunction    Conjunctiva/Sclera White and quiet White and quiet   Cornea Temporal Well healed cataract wounds, mild Arcus, trace endo pigment nasally, 1+PEE Arcus, Temporal Well healed cataract wounds, 1-2+ PEE   Anterior Chamber Deep and quiet Deep and quiet   Iris Round and moderately dilated to 4.3mm  Round and moderately dilated to 22mm, Iris atrophy from 0100 to 0230, peripheral irodotimy at 0200   Lens 3-piece PC IOL in good position PC IOL in good position   Vitreous Vitreous syneresis, Posterior vitreous detachment, Weiss ring Vitreous syneresis, PVD, vitreous condensations       Fundus Exam      Right Left   Disc 360 Peripapillary atrophy, diffuse mild pallor, compact, sharp rim mild pallor, Peripapillary atrophy and pigmentation   C/D Ratio 0.2 0.3   Macula Blunted foveal reflex, +PED, RPE mottling and clumping, Drusen, No heme or edema Blunted foveal reflex, +low lying PED / +CNVM with recurrence of SRF -- now resolved, RPE mottling and clumping, Drusen, No heme or edema,    Vessels Vascular attenuation Vascular attenuation   Periphery Attached, 360 Reticular degeneration Attached, 360 Reticular degeneration          IMAGING AND PROCEDURES  Imaging and Procedures for @TODAY @  OCT, Retina - OU - Both Eyes       Right Eye Quality was good. Central Foveal Thickness: 236. Progression has been stable. Findings include normal foveal contour, no IRF, no SRF, pigment epithelial detachment, outer retinal atrophy, retinal drusen .   Left Eye Quality was good. Central Foveal Thickness: 252. Progression has improved. Findings include no IRF, retinal drusen , pigment epithelial detachment, outer retinal atrophy, epiretinal membrane, abnormal foveal contour, no SRF (Interval resolution of SRF overlying PED).   Notes *Images captured and stored on drive  Diagnosis / Impression:  OD: Non-Exudative ARMD -- stable OS: Exudative ARMD -- Interval resolution of SRF overlying PED  Clinical management:   See below  Abbreviations: NFP - Normal foveal profile. CME - cystoid macular edema. PED - pigment epithelial detachment. IRF - intraretinal fluid. SRF - subretinal fluid. EZ - ellipsoid zone. ERM - epiretinal membrane. ORA - outer retinal atrophy. ORT - outer retinal tubulation. SRHM - subretinal hyper-reflective material         Intravitreal Injection, Pharmacologic Agent - OS - Left Eye       Time Out 02/28/2019. 1:54 PM. Confirmed correct patient, procedure, site, and patient consented.   Anesthesia Topical anesthesia was used. Anesthetic medications included Lidocaine 2%, Proparacaine 0.5%.   Procedure Preparation included 5% betadine to ocular surface, eyelid speculum. A supplied needle was used.   Injection:  1.25 mg Bevacizumab (AVASTIN) SOLN   NDC: [id number], Lot: [id number] , Expiration date: 04/04/2019   Route: Intravitreal, Site: Left Eye, Waste: 0 mL  Post-op Post injection exam found visual acuity of at least counting fingers. The patient tolerated the procedure well. There were no complications. The patient received written and verbal post procedure care education.                 ASSESSMENT/PLAN:    ICD-10-CM   1. Exudative age-related macular degeneration of left eye with active choroidal neovascularization (HCC)  H35.3221 Intravitreal Injection, Pharmacologic Agent - OS - Left Eye    Bevacizumab (AVASTIN) SOLN 1.25 mg  2. Retinal edema  H35.81 OCT, Retina - OU - Both Eyes  3. 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 Diabetes mellitus type 2 without retinopathy ([place])  E11.9   5. Hypertensive retinopathy of both eyes  H35.033   6. Essential hypertension  I10   7. Pseudophakia of both eyes  Z96.1   8. Intermediate stage nonexudative age-related macular degeneration of both eyes  H35.3132    1. Exudative age-related macular degeneration, left  eye  - conversion of OS from nonexudative to exudative  ARMD prior to 02.05.20 visit  - s/p IVA OS #1 (02.05.20), #2 (03.04.20), #3 (04.30.20), #4 (06.02.20), #5 (07.14.20), #6 (9.1.20), #7 (10.27.20), #8 (12.23.20)  - **history of recurrent SRF with extension to 8 wks, noted on 12.23.20**  - OCT shows interval resolution of SRF overlying PED at 6 wk interval  - BCVA stable at 20/40 OS  - recommend IVA #9 OS today 02.03.21 for maintenance treatment and staying at 6 wk interval  - pt wishes to be treated with IVA OS  - risks and benefits of intravitreal avastin discussed with patient  - informed consent obtained and signed  - see procedure note  - Avastin informed consent obtained and scanned today, 02.03.21  - f/u in 6 wks for DFE, OCT, likely injection OS   2. Age related macular degeneration, non-exudative, right eye  - stable, former pt of Dr. Zigmund Daniel -- no significant change in OCT or exam  - The incidence, anatomy, and pathology of dry AMD, risk of progression, and the AREDS and AREDS 2 study including smoking risks discussed with patient.  - recommend amsler grid monitoring  3. Diabetes mellitus, type 2 without retinopathy  - The incidence, risk factors for progression, natural history and treatment options for diabetic retinopathy  were discussed with patient.    - The need for close monitoring of blood glucose, blood pressure, and serum lipids, avoiding cigarette or any type of tobacco, and the need for long term follow up was also discussed with patient.  - f/u in 1 year  4,5. Hypertensive retinopathy OU  - discussed importance of tight BP control  - monitor  6. No retinal edema on exam or OCT  7. Pseudophakia OU  - s/p CE/IOL OU  - beautiful surgeries, doing well  - monitor  Ophthalmic Meds Ordered this visit:  Meds ordered this encounter  Medications  . Bevacizumab (AVASTIN) SOLN 1.25 mg   Return in about 6 weeks (around 04/11/2019) for f/u exu ARMD OS, DFE, OCT.  There are no Patient Instructions on file for this  visit.  This document serves as a record of services personally performed by Gardiner Sleeper, MD, PhD. It was created on their behalf by Roselee Nova, COMT. The creation of this record is the provider's dictation and/or activities during the visit.  Electronically signed by: Roselee Nova, COMT 02/28/19 3:56 PM   This document serves as a record of services personally performed by Gardiner Sleeper, MD, PhD. It was created on their behalf by Ernest Mallick, OA, an ophthalmic assistant. The creation of this record is the provider's dictation and/or activities during the visit.    Electronically signed by: Ernest Mallick, OA 02.03.2021 3:56 PM   Gardiner Sleeper, M.D., Ph.D. Diseases & Surgery of the Retina and [place] 02/28/2019   I have reviewed the above documentation for accuracy and completeness, and I agree with the above. Gardiner Sleeper, M.D., Ph.D. 02/28/19 3:56 PM   Abbreviations: M myopia (nearsighted); A astigmatism; H hyperopia (farsighted); P presbyopia; Mrx spectacle prescription;  CTL contact lenses; OD right eye; OS left eye; OU both eyes  XT exotropia; ET esotropia; PEK punctate epithelial keratitis; PEE punctate epithelial erosions; DES dry eye syndrome; MGD meibomian gland dysfunction; ATs artificial tears; PFAT's preservative free artificial tears; [place] nuclear sclerotic cataract; PSC posterior subcapsular cataract; ERM epi-retinal membrane; PVD posterior vitreous detachment; RD retinal detachment; DM diabetes mellitus;  DR diabetic retinopathy; NPDR non-proliferative diabetic retinopathy; PDR proliferative diabetic retinopathy; CSME clinically significant macular edema; DME diabetic macular edema; dbh dot blot hemorrhages; CWS cotton wool spot; POAG primary open angle glaucoma; C/D cup-to-disc ratio; HVF humphrey visual field; GVF goldmann visual field; OCT optical coherence tomography; IOP intraocular pressure; BRVO Branch retinal vein occlusion;  CRVO central retinal vein occlusion; CRAO central retinal artery occlusion; BRAO branch retinal artery occlusion; RT retinal tear; SB scleral buckle; PPV pars plana vitrectomy; VH Vitreous hemorrhage; PRP panretinal laser photocoagulation; IVK intravitreal kenalog; VMT vitreomacular traction; MH Macular hole;  NVD neovascularization of the disc; NVE neovascularization elsewhere; AREDS age related eye disease study; ARMD age related macular degeneration; POAG primary open angle glaucoma; EBMD epithelial/anterior basement membrane dystrophy; ACIOL anterior chamber intraocular lens; IOL intraocular lens; PCIOL posterior chamber intraocular lens; Phaco/IOL phacoemulsification with intraocular lens placement; [place] photorefractive keratectomy; LASIK laser assisted in situ keratomileusis; HTN hypertension; DM diabetes mellitus; COPD chronic obstructive pulmonary disease

## 2019-02-28 ENCOUNTER — Ambulatory Visit (INDEPENDENT_AMBULATORY_CARE_PROVIDER_SITE_OTHER): Payer: Medicare Other | Admitting: Ophthalmology

## 2019-02-28 ENCOUNTER — Encounter (INDEPENDENT_AMBULATORY_CARE_PROVIDER_SITE_OTHER): Payer: Self-pay | Admitting: Ophthalmology

## 2019-02-28 DIAGNOSIS — H35033 Hypertensive retinopathy, bilateral: Secondary | ICD-10-CM | POA: Diagnosis not present

## 2019-02-28 DIAGNOSIS — H353221 Exudative age-related macular degeneration, left eye, with active choroidal neovascularization: Secondary | ICD-10-CM

## 2019-02-28 DIAGNOSIS — Z961 Presence of intraocular lens: Secondary | ICD-10-CM

## 2019-02-28 DIAGNOSIS — H3581 Retinal edema: Secondary | ICD-10-CM | POA: Diagnosis not present

## 2019-02-28 DIAGNOSIS — I10 Essential (primary) hypertension: Secondary | ICD-10-CM

## 2019-02-28 DIAGNOSIS — E119 Type 2 diabetes mellitus without complications: Secondary | ICD-10-CM

## 2019-02-28 DIAGNOSIS — H353132 Nonexudative age-related macular degeneration, bilateral, intermediate dry stage: Secondary | ICD-10-CM

## 2019-02-28 DIAGNOSIS — H353112 Nonexudative age-related macular degeneration, right eye, intermediate dry stage: Secondary | ICD-10-CM | POA: Diagnosis not present

## 2019-02-28 MED ORDER — BEVACIZUMAB CHEMO INJECTION 1.25MG/0.05ML SYRINGE FOR KALEIDOSCOPE
1.2500 mg | INTRAVITREAL | Status: AC | PRN
  Administered 2019-02-28: 16:00:00 1.25 mg via INTRAVITREAL

## 2019-04-02 ENCOUNTER — Ambulatory Visit: Payer: Medicare Other | Attending: Internal Medicine

## 2019-04-02 DIAGNOSIS — Z23 Encounter for immunization: Secondary | ICD-10-CM

## 2019-04-02 NOTE — Progress Notes (Signed)
   Covid-19 Vaccination Clinic  Name:  DUSTI TROW    MRN: [id number] DOB: Jan 08, 1933  04/02/2019  Ms. Farewell was observed post Covid-19 immunization for 15 minutes without incident. She was provided with Vaccine Information Sheet and instruction to access the V-Safe system.   Ms. Licano was instructed to call 911 with any severe reactions post vaccine: Marland Kitchen Difficulty breathing  . Swelling of face and throat  . A fast heartbeat  . A bad rash all over body  . Dizziness and weakness   Immunizations Administered    Name Date Dose VIS Date Route   Pfizer COVID-19 Vaccine 04/02/2019  9:57 AM 0.3 mL 01/05/2019 Intramuscular   Manufacturer: [place]   Lot: [id number]   [place]: [id number]

## 2019-04-03 DIAGNOSIS — H353112 Nonexudative age-related macular degeneration, right eye, intermediate dry stage: Secondary | ICD-10-CM | POA: Diagnosis not present

## 2019-04-03 DIAGNOSIS — H524 Presbyopia: Secondary | ICD-10-CM | POA: Diagnosis not present

## 2019-04-03 DIAGNOSIS — H353221 Exudative age-related macular degeneration, left eye, with active choroidal neovascularization: Secondary | ICD-10-CM | POA: Diagnosis not present

## 2019-04-03 DIAGNOSIS — H04123 Dry eye syndrome of bilateral lacrimal glands: Secondary | ICD-10-CM | POA: Diagnosis not present

## 2019-04-03 DIAGNOSIS — E119 Type 2 diabetes mellitus without complications: Secondary | ICD-10-CM | POA: Diagnosis not present

## 2019-04-04 ENCOUNTER — Other Ambulatory Visit: Payer: Self-pay | Admitting: Dermatology

## 2019-04-04 DIAGNOSIS — D229 Melanocytic nevi, unspecified: Secondary | ICD-10-CM | POA: Diagnosis not present

## 2019-04-04 DIAGNOSIS — C44319 Basal cell carcinoma of skin of other parts of face: Secondary | ICD-10-CM | POA: Diagnosis not present

## 2019-04-04 DIAGNOSIS — L821 Other seborrheic keratosis: Secondary | ICD-10-CM | POA: Diagnosis not present

## 2019-04-09 ENCOUNTER — Telehealth: Payer: Self-pay | Admitting: *Deleted

## 2019-04-09 NOTE — Telephone Encounter (Signed)
Left message patient needs pathology results and 30 minute surgery appointment

## 2019-04-10 NOTE — Telephone Encounter (Signed)
Phone call to patient to give Pathology results.  Path results given to patients daughter Jan who is on her DPR.  Appointment schedule with Dr. Denna Haggard on 05/08/2019 @ 10:30am

## 2019-04-10 NOTE — Telephone Encounter (Signed)
Patient returned your call.

## 2019-04-10 NOTE — Telephone Encounter (Signed)
Patient calling in regards to previous message.  Please call patient and leave detailed message if does not answer.

## 2019-04-10 NOTE — Progress Notes (Signed)
[facility] Note  04/11/2019   CHIEF COMPLAINT Patient presents for Retina Follow Up  HISTORY OF PRESENT ILLNESS: Sue [name] is a 86 y.o. female who presents to the clinic today for:   HPI    Retina Follow Up    Patient presents with  Wet AMD.  In left eye.  This started months ago.  Severity is moderate.  Duration of 6 weeks.  Since onset it is stable.  I, the attending physician,  performed the HPI with the patient and updated documentation appropriately.          Comments    86 y/o female pt here for 6 wk f/u for exu ARMD OS.  No noticed change in [place] OU.  Denies pain, flashes, floaters.  Restasis BID OU.  Systane QD OU.  BS 130 this a.m.  A1C 6.0 last month.       Last edited by Bernarda Caffey, MD on 04/11/2019  2:48 PM. (History)    pt states she feels like her vision is stable, but she feels like she didn't do well on the eye chart with her left eye, she states she saw Dr. Kathlen Mody last week and he gave her a rx for Restasis, but she just started using them   Referring physician: Merrilee Seashore, [address] [place],  [place] [address]  HISTORICAL INFORMATION:   Selected notes from the MEDICAL RECORD NUMBER Referred by Dr. Quentin Ore for concern of ARMD OU    CURRENT MEDICATIONS: Current Outpatient Medications (Ophthalmic Drugs)  Medication Sig  . Polyethyl Glycol-Propyl Glycol (SYSTANE) 0.4-0.3 % SOLN Place 1 drop into both eyes 3 (three) times daily as needed (dry eyes).  . RESTASIS 0.05 % ophthalmic emulsion 1 drop 2 (two) times daily.   No current facility-administered medications for this visit. (Ophthalmic Drugs)   Current Outpatient Medications (Other)  Medication Sig  . acetaminophen (TYLENOL) 325 MG tablet Take 2 tablets (650 mg total) by mouth every 6 (six) hours as needed for mild pain or headache (fever >/= 101).  Marland Kitchen amLODipine (NORVASC) 10 MG tablet Take 10 mg by mouth every morning.   . B-D ULTRAFINE  III SHORT PEN 31G X 8 MM MISC USE AS DIRECTED ONCE DAILY SUBCUTANEOUSLY  . calcium-vitamin D (OSCAL WITH D) 500-200 MG-UNIT tablet Take 1 tablet by mouth daily with lunch.   . enalapril (VASOTEC) 20 MG tablet   . FLUZONE HIGH-DOSE QUADRIVALENT 0.7 ML SUSY   . glipiZIDE (GLUCOTROL) 10 MG tablet   . glucose blood test strip OneTouch Ultra Test strips  TEST ONCE A DAY ONCE A DAY FINGERSTICK 90  . hydrALAZINE (APRESOLINE) 25 MG tablet Take 3 tablets (75 mg total) by mouth every 8 (eight) hours.  . hydrochlorothiazide (HYDRODIURIL) 25 MG tablet Take 25 mg by mouth daily.  . Insulin Degludec (TRESIBA FLEXTOUCH) 200 UNIT/ML SOPN Inject 36 Units into the skin at bedtime.  . insulin lispro (HUMALOG KWIKPEN) 100 UNIT/ML KwikPen Inject 0.05 mLs (5 Units total) into the skin 3 (three) times daily before meals.  . Insulin Pen Needle (PEN NEEDLES) 32G X 4 MM MISC BD Ultra-Fine Nano Pen Needle 32 gauge x 5/32"  USE AS DIRECTED DAILY  . Lancets (ONETOUCH ULTRASOFT) lancets OneTouch UltraSoft Lancets  USE TWICE DAILY  . LANTUS 100 UNIT/ML injection   . meloxicam (MOBIC) 7.5 MG tablet Take 1 tablet (7.5 mg total) by mouth daily as needed for pain.  . Multiple Vitamins-Minerals (  ADULT ONE DAILY GUMMIES) CHEW Chew 2 tablets by mouth daily with lunch. Centrum  . Multiple Vitamins-Minerals (AIRBORNE) TBEF Take 1 tablet by mouth daily with lunch.  . Multiple Vitamins-Minerals (PRESERVISION AREDS 2) CAPS Take 1 capsule by mouth 2 (two) times daily.  . nortriptyline (PAMELOR) 25 MG capsule Take 1 capsule (25 mg total) by mouth at bedtime.  . Omega-3 Fatty Acids (FISH OIL) 1200 MG CAPS Take 1,200 mg by mouth daily with lunch.   Marland Kitchen omeprazole (PRILOSEC) 20 MG capsule Take 20 mg by mouth every morning.  . ONE TOUCH ULTRA TEST test strip   . PRESCRIPTION MEDICATION by Intravitreal route every 30 (thirty) days. Ophthalmic injection at MD office in left eye  . simvastatin (ZOCOR) 20 MG tablet Take 20 mg by mouth at  bedtime.   . traMADol (ULTRAM) 50 MG tablet Take 1 tablet (50 mg total) by mouth 2 (two) times daily as needed.  . vitamin B-12 (CYANOCOBALAMIN) 1000 MCG tablet Take 1,000 mcg by mouth daily with lunch.   Current Facility-Administered Medications (Other)  Medication Route  . Bevacizumab (AVASTIN) SOLN 1.25 mg Intravitreal  . Bevacizumab (AVASTIN) SOLN 1.25 mg Intravitreal      REVIEW OF SYSTEMS: ROS    Positive for: Genitourinary, Musculoskeletal, Endocrine, Eyes   Negative for: Constitutional, Gastrointestinal, Neurological, Skin, HENT, Cardiovascular, Respiratory, Psychiatric, Allergic/Imm, Heme/Lymph   Last edited by Matthew Folks, COA on 04/11/2019  2:26 PM. (History)       ALLERGIES No Known Allergies  PAST MEDICAL HISTORY Past Medical History:  Diagnosis Date  . Basal cell carcinoma 11/23/2017   left elbow, right neck  . Diabetes mellitus without complication ([place])   . Hypertension   . Hypertensive retinopathy    OU  . Macular degeneration    OU  . SCC (squamous cell carcinoma) 11/23/2017   right forehead   Past Surgical History:  Procedure Laterality Date  . ABDOMINAL HYSTERECTOMY    . CATARACT EXTRACTION    . CHOLECYSTECTOMY    . EYE SURGERY    . SPINE SURGERY      FAMILY HISTORY History reviewed. No pertinent family history.  SOCIAL HISTORY Social History   Tobacco Use  . Smoking status: Former Research scientist (life sciences)  . Smokeless tobacco: Never Used  Substance Use Topics  . Alcohol use: No    Alcohol/week: 0.0 standard drinks  . Drug use: No         OPHTHALMIC EXAM:  Base Eye Exam    Visual Acuity (Snellen - Linear)      Right Left   Dist cc 20/25 -2 20/50 -2   Dist ph cc NI NI   Correction: Glasses       Tonometry (Tonopen, 2:27 PM)      Right Left   Pressure 14 13       Pupils      Dark Light Shape React APD   Right 4 3 Round Brisk None   Left 4 3 Round Brisk None       Visual Fields (Counting fingers)      Left Right    Full Full        Extraocular Movement      Right Left    Full, Ortho Full, Ortho       Neuro/Psych    Oriented x3: Yes   Mood/Affect: Normal       Dilation    Both eyes: 1.0% Mydriacyl, 2.5% Phenylephrine @ 2:28 PM  Slit Lamp and Fundus Exam    Slit Lamp Exam      Right Left   Lids/Lashes Dermatochalasis - upper lid, Telangiectasia, mild MGD Dermatochalasis - upper lid, Telangiectasia, Meibomian gland dysfunction   Conjunctiva/Sclera White and quiet White and quiet   Cornea Temporal Well healed cataract wounds, mild Arcus, trace endo pigment nasally, 1+PEE Arcus, Temporal Well healed cataract wounds, 1-2+ PEE   Anterior Chamber Deep and quiet Deep and quiet   Iris Round and moderately dilated to 4.55mm Round and moderately dilated to 46mm, Iris atrophy from 0100 to 0230, peripheral irodotimy at 0200   Lens 3-piece PC IOL in good position PC IOL in good position   Vitreous Vitreous syneresis, Posterior vitreous detachment, Weiss ring Vitreous syneresis, PVD, vitreous condensations       Fundus Exam      Right Left   Disc 360 Peripapillary atrophy, diffuse mild pallor, compact, sharp rim mild pallor, Peripapillary atrophy and pigmentation   C/D Ratio 0.2 0.3   Macula Blunted foveal reflex, +PED, RPE mottling and clumping, Drusen, No heme or edema Blunted foveal reflex, PED / +CNVM with stable resolution of low lying SRF, RPE mottling and clumping, Drusen, No heme or edema,    Vessels Vascular attenuation Vascular attenuation   Periphery Attached, 360 Reticular degeneration Attached, 360 Reticular degeneration          IMAGING AND PROCEDURES  Imaging and Procedures for @TODAY @  OCT, Retina - OU - Both Eyes       Right Eye Quality was good. Central Foveal Thickness: 233. Progression has been stable. Findings include normal foveal contour, no IRF, no SRF, pigment epithelial detachment, outer retinal atrophy, retinal drusen .   Left Eye Quality was good. Central Foveal  Thickness: 252. Progression has been stable. Findings include no IRF, retinal drusen , pigment epithelial detachment, outer retinal atrophy, epiretinal membrane, abnormal foveal contour, no SRF (stable improvement in IRF overlying PED).   Notes *Images captured and stored on drive  Diagnosis / Impression:  OD: Non-Exudative ARMD -- stable OS: Exudative ARMD --stable improvement in IRF overlying PED   Clinical management:  See below  Abbreviations: NFP - Normal foveal profile. CME - cystoid macular edema. PED - pigment epithelial detachment. IRF - intraretinal fluid. SRF - subretinal fluid. EZ - ellipsoid zone. ERM - epiretinal membrane. ORA - outer retinal atrophy. ORT - outer retinal tubulation. SRHM - subretinal hyper-reflective material         Intravitreal Injection, Pharmacologic Agent - OS - Left Eye       Time Out 04/11/2019. 2:48 PM. Confirmed correct patient, procedure, site, and patient consented.   Anesthesia Topical anesthesia was used. Anesthetic medications included Lidocaine 2%, Proparacaine 0.5%.   Procedure Preparation included 5% betadine to ocular surface, eyelid speculum. A supplied needle was used.   Injection:  1.25 mg Bevacizumab (AVASTIN) SOLN   NDC: [id number], Lot: [id number] , Expiration date: 05/22/2019   Route: Intravitreal, Site: Left Eye, Waste: 0 mL  Post-op Post injection exam found visual acuity of at least counting fingers. The patient tolerated the procedure well. There were no complications. The patient received written and verbal post procedure care education.                 ASSESSMENT/PLAN:    ICD-10-CM   1. Exudative age-related macular degeneration of left eye with active choroidal neovascularization (HCC)  H35.3221 Intravitreal Injection, Pharmacologic Agent - OS - Left Eye    Bevacizumab (AVASTIN) SOLN 1.25 mg  2. Retinal edema  H35.81 OCT, Retina - OU - Both Eyes  3. Intermediate stage nonexudative age-related macular  degeneration of right eye  H35.3112   4. Diabetes mellitus type 2 without retinopathy ([place])  E11.9   5. Essential hypertension  I10   6. Hypertensive retinopathy of both eyes  H35.033   7. Pseudophakia of both eyes  Z96.1    1,2. Exudative age-related macular degeneration, left eye  - conversion of OS from nonexudative to exudative ARMD prior to 02.05.20 visit  - s/p IVA OS #1 (02.05.20), #2 (03.04.20), #3 (04.30.20), #4 (06.02.20), #5 (07.14.20), #6 (9.1.20), #7 (10.27.20), #8 (12.23.20), #9 (02.03.21)  - **history of recurrent SRF with extension to 8 wks, noted on 12.23.20**  - OCT shows stable resolution of SRF overlying PED at 6 wk interval  - BCVA decreased from 20/40 to 20/50-2 OS  - recommend IVA #10 OS today 03.17.21 for maintenance treatment and staying at 6 wk interval  - pt wishes to be treated with IVA OS  - risks and benefits of intravitreal avastin discussed with patient  - informed consent obtained  - see procedure note  - Avastin informed consent obtained and scanned today, 02.03.21  - f/u in 6 wks for DFE, OCT, likely injection OS   3. Age related macular degeneration, non-exudative, right eye  - stable, former pt of Dr. Zigmund Daniel -- no significant change in OCT or exam  - The incidence, anatomy, and pathology of dry AMD, risk of progression, and the AREDS and AREDS 2 study including smoking risks discussed with patient.  - recommend amsler grid monitoring  4. Diabetes mellitus, type 2 without retinopathy  - The incidence, risk factors for progression, natural history and treatment options for diabetic retinopathy  were discussed with patient.    - The need for close monitoring of blood glucose, blood pressure, and serum lipids, avoiding cigarette or any type of tobacco, and the need for long term follow up was also discussed with patient.  - f/u in 1 year  5,6. Hypertensive retinopathy OU  - discussed importance of tight BP control  - monitor  7. Pseudophakia OU  -  s/p CE/IOL OU  - beautiful surgeries, doing well  - monitor  Ophthalmic Meds Ordered this visit:  Meds ordered this encounter  Medications  . Bevacizumab (AVASTIN) SOLN 1.25 mg   Return in about 6 weeks (around 05/23/2019) for f/u exu ARMD OS, DFE, OCT.  There are no Patient Instructions on file for this visit.   This document serves as a record of services personally performed by Gardiner Sleeper, MD, PhD. It was created on their behalf by Ernest Mallick, OA, an ophthalmic assistant. The creation of this record is the provider's dictation and/or activities during the visit.    Electronically signed by: Ernest Mallick, OA 03.17.2021 10:23 PM  Gardiner Sleeper, M.D., Ph.D. Diseases & Surgery of the Retina and [place] 04/11/2019   I have reviewed the above documentation for accuracy and completeness, and I agree with the above. Gardiner Sleeper, M.D., Ph.D. 04/12/19 10:23 PM   Abbreviations: M myopia (nearsighted); A astigmatism; H hyperopia (farsighted); P presbyopia; Mrx spectacle prescription;  CTL contact lenses; OD right eye; OS left eye; OU both eyes  XT exotropia; ET esotropia; PEK punctate epithelial keratitis; PEE punctate epithelial erosions; DES dry eye syndrome; MGD meibomian gland dysfunction; ATs artificial tears; PFAT's preservative free artificial tears; [place] nuclear sclerotic cataract; PSC posterior subcapsular cataract;  ERM epi-retinal membrane; PVD posterior vitreous detachment; RD retinal detachment; DM diabetes mellitus; DR diabetic retinopathy; NPDR non-proliferative diabetic retinopathy; PDR proliferative diabetic retinopathy; CSME clinically significant macular edema; DME diabetic macular edema; dbh dot blot hemorrhages; CWS cotton wool spot; POAG primary open angle glaucoma; C/D cup-to-disc ratio; HVF humphrey visual field; GVF goldmann visual field; OCT optical coherence tomography; IOP intraocular pressure; BRVO Branch retinal vein  occlusion; CRVO central retinal vein occlusion; CRAO central retinal artery occlusion; BRAO branch retinal artery occlusion; RT retinal tear; SB scleral buckle; PPV pars plana vitrectomy; VH Vitreous hemorrhage; PRP panretinal laser photocoagulation; IVK intravitreal kenalog; VMT vitreomacular traction; MH Macular hole;  NVD neovascularization of the disc; NVE neovascularization elsewhere; AREDS age related eye disease study; ARMD age related macular degeneration; POAG primary open angle glaucoma; EBMD epithelial/anterior basement membrane dystrophy; ACIOL anterior chamber intraocular lens; IOL intraocular lens; PCIOL posterior chamber intraocular lens; Phaco/IOL phacoemulsification with intraocular lens placement; [place] photorefractive keratectomy; LASIK laser assisted in situ keratomileusis; HTN hypertension; DM diabetes mellitus; COPD chronic obstructive pulmonary disease

## 2019-04-11 ENCOUNTER — Other Ambulatory Visit: Payer: Self-pay

## 2019-04-11 ENCOUNTER — Encounter (INDEPENDENT_AMBULATORY_CARE_PROVIDER_SITE_OTHER): Payer: Self-pay | Admitting: Ophthalmology

## 2019-04-11 ENCOUNTER — Ambulatory Visit (INDEPENDENT_AMBULATORY_CARE_PROVIDER_SITE_OTHER): Payer: Medicare Other | Admitting: Ophthalmology

## 2019-04-11 DIAGNOSIS — H3581 Retinal edema: Secondary | ICD-10-CM | POA: Diagnosis not present

## 2019-04-11 DIAGNOSIS — I10 Essential (primary) hypertension: Secondary | ICD-10-CM | POA: Diagnosis not present

## 2019-04-11 DIAGNOSIS — H353221 Exudative age-related macular degeneration, left eye, with active choroidal neovascularization: Secondary | ICD-10-CM | POA: Diagnosis not present

## 2019-04-11 DIAGNOSIS — Z961 Presence of intraocular lens: Secondary | ICD-10-CM

## 2019-04-11 DIAGNOSIS — H353112 Nonexudative age-related macular degeneration, right eye, intermediate dry stage: Secondary | ICD-10-CM | POA: Diagnosis not present

## 2019-04-11 DIAGNOSIS — E119 Type 2 diabetes mellitus without complications: Secondary | ICD-10-CM

## 2019-04-11 DIAGNOSIS — H35033 Hypertensive retinopathy, bilateral: Secondary | ICD-10-CM

## 2019-04-12 MED ORDER — BEVACIZUMAB CHEMO INJECTION 1.25MG/0.05ML SYRINGE FOR KALEIDOSCOPE
1.2500 mg | INTRAVITREAL | Status: AC | PRN
  Administered 2019-04-12: 22:00:00 1.25 mg via INTRAVITREAL

## 2019-04-23 ENCOUNTER — Other Ambulatory Visit: Payer: Self-pay

## 2019-04-23 ENCOUNTER — Ambulatory Visit: Payer: Medicare Other | Admitting: Podiatry

## 2019-04-23 DIAGNOSIS — M79674 Pain in right toe(s): Secondary | ICD-10-CM

## 2019-04-23 DIAGNOSIS — B351 Tinea unguium: Secondary | ICD-10-CM | POA: Diagnosis not present

## 2019-04-23 DIAGNOSIS — L97512 Non-pressure chronic ulcer of other part of right foot with fat layer exposed: Secondary | ICD-10-CM

## 2019-04-23 DIAGNOSIS — E1142 Type 2 diabetes mellitus with diabetic polyneuropathy: Secondary | ICD-10-CM

## 2019-04-23 DIAGNOSIS — M2060 Acquired deformities of toe(s), unspecified, unspecified foot: Secondary | ICD-10-CM | POA: Diagnosis not present

## 2019-04-23 DIAGNOSIS — M79675 Pain in left toe(s): Secondary | ICD-10-CM | POA: Diagnosis not present

## 2019-04-24 ENCOUNTER — Encounter: Payer: Self-pay | Admitting: Podiatry

## 2019-04-24 NOTE — Progress Notes (Signed)
Subjective:  Patient ID: Sue [name], female    DOB: 1932/02/28,  MRN: [id number]  Chief Complaint  Patient presents with  . Callouses    pt is here for a callous trim,     86 y.o. female presents for wound care.  Patient presents with primarily complaints of thickened elongated mycotic toenails x10.  They are painful in nature.  They are dystrophic and patient has not been able to take care of it herself.  She also has hyperkeratotic lesion/preulcerative lesion on the right side that she would like to make sure that there is no ulceration underneath.  She denies any other acute complaints.  She has been wearing her diabetic shoes but she is due for another pair of diabetic shoes as the previous one will start wearing out.  She would like to know the process of obtaining new pair of diabetic shoes.  She denies any other acute complaints.   Review of Systems: Negative except as noted in the HPI. Denies N/V/F/Ch.  Past Medical History:  Diagnosis Date  . Basal cell carcinoma 11/23/2017   left elbow, right neck  . Diabetes mellitus without complication ([place])   . Hypertension   . Hypertensive retinopathy    OU  . Macular degeneration    OU  . SCC (squamous cell carcinoma) 11/23/2017   right forehead    Current Outpatient Medications:  .  acetaminophen (TYLENOL) 325 MG tablet, Take 2 tablets (650 mg total) by mouth every 6 (six) hours as needed for mild pain or headache (fever >/= 101)., Disp: 10 tablet, Rfl: 0 .  amLODipine (NORVASC) 10 MG tablet, Take 10 mg by mouth every morning. , Disp: , Rfl:  .  B-D ULTRAFINE III SHORT PEN 31G X 8 MM MISC, USE AS DIRECTED ONCE DAILY SUBCUTANEOUSLY, Disp: , Rfl: 11 .  calcium-vitamin D (OSCAL WITH D) 500-200 MG-UNIT tablet, Take 1 tablet by mouth daily with lunch. , Disp: , Rfl:  .  enalapril (VASOTEC) 20 MG tablet, , Disp: , Rfl:  .  FLUZONE HIGH-DOSE QUADRIVALENT 0.7 ML SUSY, , Disp: , Rfl:  .  glipiZIDE (GLUCOTROL) 10 MG tablet, , Disp: ,  Rfl:  .  glucose blood test strip, OneTouch Ultra Test strips  TEST ONCE A DAY ONCE A DAY FINGERSTICK 90, Disp: , Rfl:  .  hydrALAZINE (APRESOLINE) 25 MG tablet, Take 3 tablets (75 mg total) by mouth every 8 (eight) hours., Disp: 270 tablet, Rfl: 0 .  hydrochlorothiazide (HYDRODIURIL) 25 MG tablet, Take 25 mg by mouth daily., Disp: , Rfl:  .  Insulin Degludec (TRESIBA FLEXTOUCH) 200 UNIT/ML SOPN, Inject 36 Units into the skin at bedtime., Disp: 3 pen, Rfl: 0 .  insulin lispro (HUMALOG KWIKPEN) 100 UNIT/ML KwikPen, Inject 0.05 mLs (5 Units total) into the skin 3 (three) times daily before meals., Disp: 15 mL, Rfl: 0 .  Insulin Pen Needle (PEN NEEDLES) 32G X 4 MM MISC, BD Ultra-Fine Nano Pen Needle 32 gauge x 5/32"  USE AS DIRECTED DAILY, Disp: , Rfl:  .  Lancets (ONETOUCH ULTRASOFT) lancets, OneTouch UltraSoft Lancets  USE TWICE DAILY, Disp: , Rfl:  .  LANTUS 100 UNIT/ML injection, , Disp: , Rfl:  .  meloxicam (MOBIC) 7.5 MG tablet, Take 1 tablet (7.5 mg total) by mouth daily as needed for pain., Disp: 30 tablet, Rfl: 3 .  Multiple Vitamins-Minerals (ADULT ONE DAILY GUMMIES) CHEW, Chew 2 tablets by mouth daily with lunch. Centrum, Disp: , Rfl:  .  Multiple Vitamins-Minerals (  AIRBORNE) TBEF, Take 1 tablet by mouth daily with lunch., Disp: , Rfl:  .  Multiple Vitamins-Minerals (PRESERVISION AREDS 2) CAPS, Take 1 capsule by mouth 2 (two) times daily., Disp: , Rfl:  .  nortriptyline (PAMELOR) 25 MG capsule, Take 1 capsule (25 mg total) by mouth at bedtime., Disp: 30 capsule, Rfl: 0 .  Omega-3 Fatty Acids (FISH OIL) 1200 MG CAPS, Take 1,200 mg by mouth daily with lunch. , Disp: , Rfl:  .  omeprazole (PRILOSEC) 20 MG capsule, Take 20 mg by mouth every morning., Disp: , Rfl:  .  ONE TOUCH ULTRA TEST test strip, , Disp: , Rfl:  .  Polyethyl Glycol-Propyl Glycol (SYSTANE) 0.4-0.3 % SOLN, Place 1 drop into both eyes 3 (three) times daily as needed (dry eyes)., Disp: , Rfl:  .  PRESCRIPTION MEDICATION, by  Intravitreal route every 30 (thirty) days. Ophthalmic injection at MD office in left eye, Disp: , Rfl:  .  RESTASIS 0.05 % ophthalmic emulsion, 1 drop 2 (two) times daily., Disp: , Rfl:  .  simvastatin (ZOCOR) 20 MG tablet, Take 20 mg by mouth at bedtime. , Disp: , Rfl:  .  traMADol (ULTRAM) 50 MG tablet, Take 1 tablet (50 mg total) by mouth 2 (two) times daily as needed., Disp: 20 tablet, Rfl: 0 .  vitamin B-12 (CYANOCOBALAMIN) 1000 MCG tablet, Take 1,000 mcg by mouth daily with lunch., Disp: , Rfl:   Current Facility-Administered Medications:  .  Bevacizumab (AVASTIN) SOLN 1.25 mg, 1.25 mg, Intravitreal, , Bernarda Caffey, MD, 1.25 mg at 03/01/18 1344 .  Bevacizumab (AVASTIN) SOLN 1.25 mg, 1.25 mg, Intravitreal, , Bernarda Caffey, MD, 1.25 mg at 03/31/18 1449  Social History   Tobacco Use  Smoking Status Former Smoker  Smokeless Tobacco Never Used    No Known Allergies Objective:  There were no vitals filed for this visit. There is no height or weight on file to calculate BMI. Constitutional Well developed. Well nourished.  Vascular Dorsalis pedis pulses palpable bilaterally. Posterior tibial pulses palpable bilaterally. Capillary refill normal to all digits.  No cyanosis or clubbing noted. Pedal hair growth normal.  Neurologic Normal speech. Oriented to person, place, and time. Protective sensation absent  Dermatologic Wound Location: Right submetatarsal 2 ulceration Wound Base: Granular/Healthy Peri-wound: Normal Exudate: Scant/small amount Serous exudate Wound Measurements: -See below Nail Exam: Pt has thick disfigured discolored nails with subungual debris noted bilateral entire nail hallux through fifth toenails  Orthopedic: No pain to palpation either foot.   Radiographs: None Assessment:   1. Pain due to onychomycosis of toenails of both feet   2. Diabetic peripheral neuropathy associated with type 2 diabetes mellitus ([place])   3. Ulcer of right foot with fat layer  exposed ([place])   4. Acquired deformity of toe, unspecified laterality    Plan:  Patient was evaluated and treated and all questions answered.  Ulcer right submetatarsal 2 ulcer -Debridement as below. -Dressed with Neosporin and a Band-Aid,  -Continue off-loading with surgical shoe. -Surgical shoe was dispensed to offload the right submetatarsal 2 ulceration. -Patient will also benefit from new pair of diabetic insoles as patient has a history of hammertoe contracture with a history of ulceration.  Patient will also benefit from offloading the right submetatarsal 2 ulceration to allow for proper healing. -She will be scheduled to see Liliane Channel for a new pair of diabetic shoes with insoles.  Onychomycosis with pain  -Nails palliatively debrided as below. -Educated on self-care  Procedure: Nail Debridement Rationale: pain  Type of Debridement: manual, sharp debridement. Instrumentation: Nail nipper, rotary burr. Number of Nails: 10  Procedures and Treatment: Consent by patient was obtained for treatment procedures. The patient understood the discussion of treatment and procedures well. All questions were answered thoroughly reviewed. Debridement of mycotic and hypertrophic toenails, 1 through 5 bilateral and clearing of subungual debris. No ulceration, no infection noted.  Return Visit-Office Procedure: Patient instructed to return to the office for a follow up visit 3 months for continued evaluation and treatment.  Boneta Lucks, DPM    Return in about 1 week (around 04/30/2019) for Sched with Liliane Channel for Diabetic shoes/insoles.   Procedure: Excisional Debridement of Wound Rationale: Removal of non-viable soft tissue from the wound to promote healing.  Anesthesia: none Pre-Debridement Wound Measurements: 0.4 cm x 0.4 cm x 0.2 cm Post-Debridement Wound Measurements: 0.2 cm x 0.2 cm x 0.2 cm  Type of Debridement: Sharp Excisional Tissue Removed: Non-viable soft tissue Depth of Debridement:  subcutaneous tissue. Technique: Sharp excisional debridement to bleeding, viable wound base.  Dressing: Dry, sterile, compression dressing. Disposition: Patient tolerated procedure well. Patient to return in 1 week for follow-up.  Return in about 1 week (around 04/30/2019) for Sched with Liliane Channel for Diabetic shoes/insoles.

## 2019-05-02 ENCOUNTER — Ambulatory Visit: Payer: Medicare Other | Attending: Internal Medicine

## 2019-05-02 DIAGNOSIS — Z23 Encounter for immunization: Secondary | ICD-10-CM

## 2019-05-02 NOTE — Progress Notes (Signed)
   Covid-19 Vaccination Clinic  Name:  Sue [name]    MRN: [id number] DOB: 18-Oct-1932  05/02/2019  [name] was observed post Covid-19 immunization for 15 minutes without incident. She was provided with Vaccine Information Sheet and instruction to access the V-Safe system.   [name] was instructed to call 911 with any severe reactions post vaccine: Marland Kitchen Difficulty breathing  . Swelling of face and throat  . A fast heartbeat  . A bad rash all over body  . Dizziness and weakness   Immunizations Administered    Name Date Dose VIS Date Route   Pfizer COVID-19 Vaccine 05/02/2019 11:06 AM 0.3 mL 01/05/2019 Intramuscular   Manufacturer: [employer], [employer]   Lot: [id number]   [place]: [id number]

## 2019-05-04 ENCOUNTER — Other Ambulatory Visit: Payer: Self-pay

## 2019-05-04 ENCOUNTER — Ambulatory Visit: Payer: Medicare Other | Admitting: Orthotics

## 2019-05-04 DIAGNOSIS — B351 Tinea unguium: Secondary | ICD-10-CM

## 2019-05-04 DIAGNOSIS — L84 Corns and callosities: Secondary | ICD-10-CM

## 2019-05-04 DIAGNOSIS — E08621 Diabetes mellitus due to underlying condition with foot ulcer: Secondary | ICD-10-CM

## 2019-05-04 DIAGNOSIS — E1142 Type 2 diabetes mellitus with diabetic polyneuropathy: Secondary | ICD-10-CM

## 2019-05-04 DIAGNOSIS — L97512 Non-pressure chronic ulcer of other part of right foot with fat layer exposed: Secondary | ICD-10-CM

## 2019-05-04 DIAGNOSIS — M79674 Pain in right toe(s): Secondary | ICD-10-CM

## 2019-05-04 DIAGNOSIS — L97519 Non-pressure chronic ulcer of other part of right foot with unspecified severity: Secondary | ICD-10-CM

## 2019-05-04 NOTE — Progress Notes (Signed)

## 2019-05-08 ENCOUNTER — Other Ambulatory Visit: Payer: Self-pay

## 2019-05-08 ENCOUNTER — Ambulatory Visit (INDEPENDENT_AMBULATORY_CARE_PROVIDER_SITE_OTHER): Payer: Medicare Other | Admitting: Dermatology

## 2019-05-08 ENCOUNTER — Encounter: Payer: Self-pay | Admitting: Dermatology

## 2019-05-08 DIAGNOSIS — C44319 Basal cell carcinoma of skin of other parts of face: Secondary | ICD-10-CM

## 2019-05-08 DIAGNOSIS — C4491 Basal cell carcinoma of skin, unspecified: Secondary | ICD-10-CM

## 2019-05-08 NOTE — Patient Instructions (Signed)

## 2019-05-11 ENCOUNTER — Ambulatory Visit: Payer: Medicare Other | Admitting: Podiatry

## 2019-05-11 ENCOUNTER — Other Ambulatory Visit: Payer: Self-pay

## 2019-05-11 ENCOUNTER — Encounter: Payer: Self-pay | Admitting: Podiatry

## 2019-05-11 DIAGNOSIS — L97512 Non-pressure chronic ulcer of other part of right foot with fat layer exposed: Secondary | ICD-10-CM

## 2019-05-11 DIAGNOSIS — E1142 Type 2 diabetes mellitus with diabetic polyneuropathy: Secondary | ICD-10-CM | POA: Diagnosis not present

## 2019-05-11 NOTE — Progress Notes (Signed)
Subjective:  Patient ID: Sue [name], female    DOB: 1932/05/28,  MRN: [id number]  Chief Complaint  Patient presents with  . Diabetic Ulcer    2 week follow up right foot - second submet head, also new callus area right second toe  . Diabetes    A1C  9.1    86 y.o. female presents for wound care.  Patient presents with 2 ulceration to the right submetatarsal 4 as well as the distal tip hyperkeratotic lesion of the second digit.  Patient states she is doing overall well.  The lesion itself has completely healed.  They have been keeping it covered as well as offloaded well.  Patient is a diabetic with last A1c of 9.1.  She states that she has old pair of diabetic shoes.  She still waiting on the new pair.  She was fitted by Liliane Channel recently this past month.  She is awaiting to get them made.  She denies any other acute complaints   Review of Systems: Negative except as noted in the HPI. Denies N/V/F/Ch.  Past Medical History:  Diagnosis Date  . Basal cell carcinoma 11/23/2017   left elbow, right neck TX CX3 5FU   . Basal cell carcinoma 04/04/2019   infil-right sideburn (CX35FU)  . Diabetes mellitus without complication ([place])   . Hypertension   . Hypertensive retinopathy    OU  . Macular degeneration    OU  . SCC (squamous cell carcinoma) 11/23/2017   right forehead TX CX3 5FU    Current Outpatient Medications:  .  acetaminophen (TYLENOL) 325 MG tablet, Take 2 tablets (650 mg total) by mouth every 6 (six) hours as needed for mild pain or headache (fever >/= 101)., Disp: 10 tablet, Rfl: 0 .  amLODipine (NORVASC) 10 MG tablet, Take 10 mg by mouth every morning. , Disp: , Rfl:  .  B-D ULTRAFINE III SHORT PEN 31G X 8 MM MISC, USE AS DIRECTED ONCE DAILY SUBCUTANEOUSLY, Disp: , Rfl: 11 .  calcium-vitamin D (OSCAL WITH D) 500-200 MG-UNIT tablet, Take 1 tablet by mouth daily with lunch. , Disp: , Rfl:  .  enalapril (VASOTEC) 20 MG tablet, , Disp: , Rfl:  .  FLUZONE HIGH-DOSE QUADRIVALENT  0.7 ML SUSY, , Disp: , Rfl:  .  glipiZIDE (GLUCOTROL) 10 MG tablet, , Disp: , Rfl:  .  glucose blood test strip, OneTouch Ultra Test strips  TEST ONCE A DAY ONCE A DAY FINGERSTICK 90, Disp: , Rfl:  .  hydrALAZINE (APRESOLINE) 25 MG tablet, Take 3 tablets (75 mg total) by mouth every 8 (eight) hours., Disp: 270 tablet, Rfl: 0 .  hydrochlorothiazide (HYDRODIURIL) 25 MG tablet, Take 25 mg by mouth daily., Disp: , Rfl:  .  Insulin Degludec (TRESIBA FLEXTOUCH) 200 UNIT/ML SOPN, Inject 36 Units into the skin at bedtime., Disp: 3 pen, Rfl: 0 .  insulin lispro (HUMALOG KWIKPEN) 100 UNIT/ML KwikPen, Inject 0.05 mLs (5 Units total) into the skin 3 (three) times daily before meals., Disp: 15 mL, Rfl: 0 .  Insulin Pen Needle (PEN NEEDLES) 32G X 4 MM MISC, BD Ultra-Fine Nano Pen Needle 32 gauge x 5/32"  USE AS DIRECTED DAILY, Disp: , Rfl:  .  Lancets (ONETOUCH ULTRASOFT) lancets, OneTouch UltraSoft Lancets  USE TWICE DAILY, Disp: , Rfl:  .  LANTUS 100 UNIT/ML injection, , Disp: , Rfl:  .  meloxicam (MOBIC) 7.5 MG tablet, Take 1 tablet (7.5 mg total) by mouth daily as needed for pain. (Patient not taking:  Reported on 05/08/2019), Disp: 30 tablet, Rfl: 3 .  Multiple Vitamins-Minerals (ADULT ONE DAILY GUMMIES) CHEW, Chew 2 tablets by mouth daily with lunch. Centrum, Disp: , Rfl:  .  Multiple Vitamins-Minerals (AIRBORNE) TBEF, Take 1 tablet by mouth daily with lunch., Disp: , Rfl:  .  Multiple Vitamins-Minerals (PRESERVISION AREDS 2) CAPS, Take 1 capsule by mouth 2 (two) times daily., Disp: , Rfl:  .  nortriptyline (PAMELOR) 25 MG capsule, Take 1 capsule (25 mg total) by mouth at bedtime., Disp: 30 capsule, Rfl: 0 .  Omega-3 Fatty Acids (FISH OIL) 1200 MG CAPS, Take 1,200 mg by mouth daily with lunch. , Disp: , Rfl:  .  omeprazole (PRILOSEC) 20 MG capsule, Take 20 mg by mouth every morning., Disp: , Rfl:  .  ONE TOUCH ULTRA TEST test strip, , Disp: , Rfl:  .  Polyethyl Glycol-Propyl Glycol (SYSTANE) 0.4-0.3 % SOLN,  Place 1 drop into both eyes 3 (three) times daily as needed (dry eyes)., Disp: , Rfl:  .  PRESCRIPTION MEDICATION, by Intravitreal route every 30 (thirty) days. Ophthalmic injection at MD office in left eye, Disp: , Rfl:  .  RESTASIS 0.05 % ophthalmic emulsion, 1 drop 2 (two) times daily., Disp: , Rfl:  .  simvastatin (ZOCOR) 20 MG tablet, Take 20 mg by mouth at bedtime. , Disp: , Rfl:  .  traMADol (ULTRAM) 50 MG tablet, Take 1 tablet (50 mg total) by mouth 2 (two) times daily as needed., Disp: 20 tablet, Rfl: 0 .  vitamin B-12 (CYANOCOBALAMIN) 1000 MCG tablet, Take 1,000 mcg by mouth daily with lunch., Disp: , Rfl:   Current Facility-Administered Medications:  .  Bevacizumab (AVASTIN) SOLN 1.25 mg, 1.25 mg, Intravitreal, , Bernarda Caffey, MD, 1.25 mg at 03/01/18 1344 .  Bevacizumab (AVASTIN) SOLN 1.25 mg, 1.25 mg, Intravitreal, , Bernarda Caffey, MD, 1.25 mg at 03/31/18 1449  Social History   Tobacco Use  Smoking Status Former Smoker  Smokeless Tobacco Never Used    No Known Allergies Objective:  There were no vitals filed for this visit. There is no height or weight on file to calculate BMI. Constitutional Well developed. Well nourished.  Vascular Dorsalis pedis pulses palpable bilaterally. Posterior tibial pulses palpable bilaterally. Capillary refill normal to all digits.  No cyanosis or clubbing noted. Pedal hair growth normal.  Neurologic Normal speech. Oriented to person, place, and time. Protective sensation absent  Dermatologic  right submetatarsal wound resolved.  No clinical signs of infection noted.  Reepithelialization of the skin noted.  Nail Exam: Pt has thick disfigured discolored nails with subungual debris noted bilateral entire nail hallux through fifth toenails  Orthopedic: No pain to palpation either foot.   Radiographs: None Assessment:   1. Diabetic peripheral neuropathy associated with type 2 diabetes mellitus ([place])   2. Ulcer of right foot with fat layer  exposed ([place])    Plan:  Patient was evaluated and treated and all questions answered.  Ulcer right submetatarsal 2 ulcer~resolved -Completely resolved.  No further wound noted.  No clinical signs of infection noted. -She is awaiting a new pair of diabetic shoes which she was casted for earlier this week.  Have asked her to continue wearing either surgical shoe or the previous diabetic shoes to help completely offload that submetatarsal 2 pressure lesion.  Patient states understanding -Patient is officially discharged from my care if there is any foot and ankle issues arises in the future she can come back and see me.  States understanding  No follow-ups on file.

## 2019-05-12 ENCOUNTER — Encounter: Payer: Self-pay | Admitting: Dermatology

## 2019-05-12 NOTE — Progress Notes (Addendum)
   Follow-Up Visit   Subjective  Sue [name] is a 86 y.o. female who presents for the following: Procedure [facility] TREATMENT RIGHT SIDEBURN).  BCC Location: Right cheek Duration:  Quality:  Associated Signs/Symptoms: Modifying Factors:  Severity:  Timing: Context: For treatment  The following portions of the chart were reviewed this encounter and updated as appropriate: Tobacco  Allergies  Meds  Problems  Med Hx  Surg Hx  Fam Hx      Objective  Well appearing patient in no apparent distress; mood and affect are within normal limits.  A focused examination was performed including head and neck. Relevant physical exam findings are noted in the Assessment and Plan.   Assessment & Plan  Basal cell carcinoma (BCC), unspecified site Right sideburn  Skin excision  Destruction of lesion  Destruction method: electrodesiccation and curettage   Destruction method comment:  Curettage plus innoculation with parenteral fluorouracil Informed consent: discussed and consent obtained   Anesthesia: the lesion was anesthetized in a standard fashion   Anesthetic:  1% lidocaine w/ epinephrine 1-100,000 local infiltration Curettage performed in three different directions: Yes   Electrodesiccation performed over the curetted area: Yes   Curettage cycles:  3 Lesion length (cm):  1.6 Lesion width (cm):  1 Margin per side (cm):  0 Final wound size (cm):  1.6 Hemostasis achieved with:  ferric subsulfate Outcome: patient tolerated procedure well with no complications   Additional details:  Triple curettage showed width>depth, central deeper pocket cauterized, re-curetted and entire lesion inoculated with parenteral 5FU

## 2019-05-15 ENCOUNTER — Ambulatory Visit: Payer: Medicare Other | Admitting: Podiatry

## 2019-05-22 NOTE — Progress Notes (Signed)
[facility] Note  05/23/2019   CHIEF COMPLAINT Patient presents for Retina Follow Up  HISTORY OF PRESENT ILLNESS: Sue [name] is a 86 y.o. female who presents to the clinic today for:   HPI    Retina Follow Up    Patient presents with  Wet AMD.  In left eye.  Severity is moderate.  Duration of 6 weeks.  Since onset it is stable.  I, the attending physician,  performed the HPI with the patient and updated documentation appropriately.          Comments    Patient states vision the same OU.        Last edited by Bernarda Caffey, MD on 05/24/2019 11:32 PM. (History)    pt states her left eye vision is doing well, she has been using Restasis per Dr. Kathlen Mody  Referring physician: Merrilee Seashore, [address],  [place] [address]  HISTORICAL INFORMATION:   Selected notes from the MEDICAL RECORD NUMBER Referred by Dr. Quentin Ore for concern of ARMD OU    CURRENT MEDICATIONS: Current Outpatient Medications (Ophthalmic Drugs)  Medication Sig  . Polyethyl Glycol-Propyl Glycol (SYSTANE) 0.4-0.3 % SOLN Place 1 drop into both eyes 3 (three) times daily as needed (dry eyes).  . RESTASIS 0.05 % ophthalmic emulsion 1 drop 2 (two) times daily.   No current facility-administered medications for this visit. (Ophthalmic Drugs)   Current Outpatient Medications (Other)  Medication Sig  . acetaminophen (TYLENOL) 325 MG tablet Take 2 tablets (650 mg total) by mouth every 6 (six) hours as needed for mild pain or headache (fever >/= 101).  Marland Kitchen amLODipine (NORVASC) 10 MG tablet Take 10 mg by mouth every morning.   . B-D ULTRAFINE III SHORT PEN 31G X 8 MM MISC USE AS DIRECTED ONCE DAILY SUBCUTANEOUSLY  . calcium-vitamin D (OSCAL WITH D) 500-200 MG-UNIT tablet Take 1 tablet by mouth daily with lunch.   . enalapril (VASOTEC) 20 MG tablet   . FLUZONE HIGH-DOSE QUADRIVALENT 0.7 ML SUSY   . glipiZIDE (GLUCOTROL) 10 MG tablet   . glucose blood  test strip OneTouch Ultra Test strips  TEST ONCE A DAY ONCE A DAY FINGERSTICK 90  . hydrALAZINE (APRESOLINE) 25 MG tablet Take 3 tablets (75 mg total) by mouth every 8 (eight) hours.  . hydrochlorothiazide (HYDRODIURIL) 25 MG tablet Take 25 mg by mouth daily.  . Insulin Degludec (TRESIBA FLEXTOUCH) 200 UNIT/ML SOPN Inject 36 Units into the skin at bedtime.  . insulin lispro (HUMALOG KWIKPEN) 100 UNIT/ML KwikPen Inject 0.05 mLs (5 Units total) into the skin 3 (three) times daily before meals.  . Insulin Pen Needle (PEN NEEDLES) 32G X 4 MM MISC BD Ultra-Fine Nano Pen Needle 32 gauge x 5/32"  USE AS DIRECTED DAILY  . Lancets (ONETOUCH ULTRASOFT) lancets OneTouch UltraSoft Lancets  USE TWICE DAILY  . LANTUS 100 UNIT/ML injection   . Multiple Vitamins-Minerals (ADULT ONE DAILY GUMMIES) CHEW Chew 2 tablets by mouth daily with lunch. Centrum  . Multiple Vitamins-Minerals (AIRBORNE) TBEF Take 1 tablet by mouth daily with lunch.  . Multiple Vitamins-Minerals (PRESERVISION AREDS 2) CAPS Take 1 capsule by mouth 2 (two) times daily.  . nortriptyline (PAMELOR) 25 MG capsule Take 1 capsule (25 mg total) by mouth at bedtime.  . Omega-3 Fatty Acids (FISH OIL) 1200 MG CAPS Take 1,200 mg by mouth daily with lunch.   Marland Kitchen omeprazole (PRILOSEC) 20 MG capsule Take 20 mg by mouth  every morning.  . ONE TOUCH ULTRA TEST test strip   . PRESCRIPTION MEDICATION by Intravitreal route every 30 (thirty) days. Ophthalmic injection at MD office in left eye  . simvastatin (ZOCOR) 20 MG tablet Take 20 mg by mouth at bedtime.   . traMADol (ULTRAM) 50 MG tablet Take 1 tablet (50 mg total) by mouth 2 (two) times daily as needed.  . vitamin B-12 (CYANOCOBALAMIN) 1000 MCG tablet Take 1,000 mcg by mouth daily with lunch.  . meloxicam (MOBIC) 7.5 MG tablet Take 1 tablet (7.5 mg total) by mouth daily as needed for pain. (Patient not taking: Reported on 05/08/2019)   Current Facility-Administered Medications (Other)  Medication Route  .  Bevacizumab (AVASTIN) SOLN 1.25 mg Intravitreal  . Bevacizumab (AVASTIN) SOLN 1.25 mg Intravitreal      REVIEW OF SYSTEMS: ROS    Positive for: Genitourinary, Musculoskeletal, Endocrine, Eyes   Negative for: Constitutional, Gastrointestinal, Neurological, Skin, HENT, Cardiovascular, Respiratory, Psychiatric, Allergic/Imm, Heme/Lymph   Last edited by Roselee Nova D, COT on 05/23/2019  1:52 PM. (History)       ALLERGIES No Known Allergies  PAST MEDICAL HISTORY Past Medical History:  Diagnosis Date  . Basal cell carcinoma 11/23/2017   left elbow, right neck TX CX3 5FU   . Basal cell carcinoma 04/04/2019   infil-right sideburn (CX35FU)  . Diabetes mellitus without complication ([place])   . Hypertension   . Hypertensive retinopathy    OU  . Macular degeneration    OU  . SCC (squamous cell carcinoma) 11/23/2017   right forehead TX CX3 5FU   Past Surgical History:  Procedure Laterality Date  . ABDOMINAL HYSTERECTOMY    . CATARACT EXTRACTION    . CHOLECYSTECTOMY    . EYE SURGERY    . SPINE SURGERY      FAMILY HISTORY History reviewed. No pertinent family history.  SOCIAL HISTORY Social History   Tobacco Use  . Smoking status: Former Research scientist (life sciences)  . Smokeless tobacco: Never Used  Substance Use Topics  . Alcohol use: No    Alcohol/week: 0.0 standard drinks  . Drug use: No         OPHTHALMIC EXAM:  Base Eye Exam    Visual Acuity (Snellen - Linear)      Right Left   Dist cc 20/30 +1 20/40 -1   Dist ph cc NI NI   Correction: Glasses       Tonometry (Tonopen, 2:02 PM)      Right Left   Pressure 15 13       Pupils      Dark Light Shape React APD   Right 4 3 Round Brisk None   Left 4 3 Round Brisk None       Visual Fields (Counting fingers)      Left Right    Full Full       Extraocular Movement      Right Left    Full, Ortho Full, Ortho       Neuro/Psych    Oriented x3: Yes   Mood/Affect: Normal       Dilation    Both eyes: 1.0% Mydriacyl,  2.5% Phenylephrine @ 2:02 PM        Slit Lamp and Fundus Exam    Slit Lamp Exam      Right Left   Lids/Lashes Dermatochalasis - upper lid, Telangiectasia, mild MGD Dermatochalasis - upper lid, Telangiectasia, Meibomian gland dysfunction   Conjunctiva/Sclera White and quiet White and quiet   Cornea  Temporal Well healed cataract wounds, mild Arcus, trace endo pigment nasally, 1+PEE Arcus, Temporal Well healed cataract wounds, 1-2+ PEE   Anterior Chamber Deep and quiet Deep and quiet   Iris Round and moderately dilated to 4.58mm Round and moderately dilated to 44mm, Iris atrophy from 0100 to 0230, peripheral irodotimy at 0200   Lens 3-piece PC IOL in good position PC IOL in good position   Vitreous Vitreous syneresis, Posterior vitreous detachment, Weiss ring Vitreous syneresis, PVD, vitreous condensations       Fundus Exam      Right Left   Disc 360 Peripapillary atrophy, diffuse mild pallor, compact, sharp rim mild pallor, Peripapillary atrophy and pigmentation   C/D Ratio 0.2 0.3   Macula Blunted foveal reflex, +PED, RPE mottling and clumping, Drusen, No heme or edema Blunted foveal reflex, PED / +CNVM with stable resolution of low lying SRF, RPE mottling and clumping, Drusen, No heme or edema, new CWS inferior macula   Vessels Vascular attenuation Vascular attenuation   Periphery Attached, 360 Reticular degeneration Attached, 360 Reticular degeneration        Refraction    Wearing Rx      Sphere Cylinder Axis   Right -1.50 +0.75 180   Left -0.50 +0.75 180       Manifest Refraction      Sphere Cylinder Axis Dist VA   Right -1.50 +0.75 180 NI   Left -0.50 +0.75 175 NI          IMAGING AND PROCEDURES  Imaging and Procedures for @TODAY @  Intravitreal Injection, Pharmacologic Agent - OS - Left Eye       Time Out 05/23/2019. 1:50 PM. Confirmed correct patient, procedure, site, and patient consented.   Anesthesia Topical anesthesia was used. Anesthetic medications included  Lidocaine 2%, Proparacaine 0.5%.   Procedure Preparation included 5% betadine to ocular surface, eyelid speculum. A (32g) needle was used.   Injection:  1.25 mg Bevacizumab (AVASTIN) SOLN   NDC: [id number], Lot: [id number] , Expiration date: 08/21/2019   Route: Intravitreal, Site: Left Eye, Waste: 0 mL  Post-op Post injection exam found visual acuity of at least counting fingers. The patient tolerated the procedure well. There were no complications. The patient received written and verbal post procedure care education.        OCT, Retina - OU - Both Eyes       Right Eye Quality was good. Central Foveal Thickness: 233. Progression has been stable. Findings include normal foveal contour, no IRF, no SRF, pigment epithelial detachment, outer retinal atrophy, retinal drusen  (persistent PED).   Left Eye Quality was good. Central Foveal Thickness: 244. Progression has been stable. Findings include no IRF, retinal drusen , pigment epithelial detachment, outer retinal atrophy, epiretinal membrane, abnormal foveal contour, no SRF (stable improvement in IRF overlying low lying PED).   Notes *Images captured and stored on drive  Diagnosis / Impression:  OD: Non-Exudative ARMD -- stable OS: Exudative ARMD --stable improvement in IRF overlying low lying PED   Clinical management:  See below  Abbreviations: NFP - Normal foveal profile. CME - cystoid macular edema. PED - pigment epithelial detachment. IRF - intraretinal fluid. SRF - subretinal fluid. EZ - ellipsoid zone. ERM - epiretinal membrane. ORA - outer retinal atrophy. ORT - outer retinal tubulation. SRHM - subretinal hyper-reflective material                  ASSESSMENT/PLAN:    ICD-10-CM   1. Exudative age-related macular degeneration of left  eye with active choroidal neovascularization (HCC)  H35.3221 Intravitreal Injection, Pharmacologic Agent - OS - Left Eye    Bevacizumab (AVASTIN) SOLN 1.25 mg  2. Retinal edema   H35.81 OCT, Retina - OU - Both Eyes  3. 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 Diabetes mellitus type 2 without retinopathy ([place])  E11.9   5. Essential hypertension  I10   6. Hypertensive retinopathy of both eyes  H35.033   7. Pseudophakia of both eyes  Z96.1    1,2. Exudative age-related macular degeneration, left eye  - conversion of OS from nonexudative to exudative ARMD prior to 02.05.20 visit  - s/p IVA OS #1 (02.05.20), #2 (03.04.20), #3 (04.30.20), #4 (06.02.20), #5 (07.14.20), #6 (9.1.20), #7 (10.27.20), #8 (12.23.20), #9 (02.03.21), #10 (03.17.21)  - **history of recurrent SRF with extension to 8 wks, noted on 12.23.20**  - OCT shows stable resolution of SRF overlying PED at 6 wk interval  - BCVA stable at 20/40 OS  - recommend IVA #11 OS today 04.28.21 for maintenance treatment and staying at 6 wk interval  - pt wishes to be treated with IVA OS  - risks and benefits of intravitreal avastin discussed with patient  - informed consent obtained  - see procedure note  - Avastin informed consent obtained and scanned today, 02.03.21  - f/u in 6 wks for DFE, OCT, likely injection OS   3. Age related macular degeneration, non-exudative, right eye  - stable, former pt of Dr. Zigmund Daniel -- no significant change in OCT or exam  - The incidence, anatomy, and pathology of dry AMD, risk of progression, and the AREDS and AREDS 2 study including smoking risks discussed with patient.  - recommend amsler gird monitoring  4. Diabetes mellitus, type 2 without retinopathy  - The incidence, risk factors for progression, natural history and treatment options for diabetic retinopathy  were discussed with patient.    - The need for close monitoring of blood glucose, blood pressure, and serum lipids, avoiding cigarette or any type of tobacco, and the need for long term follow up was also discussed with patient.  - f/u in 1 year  5,6. Hypertensive  retinopathy OU  - discussed importance of tight BP control  - monitor  7. Pseudophakia OU  - s/p CE/IOL OU  - beautiful surgeries, doing well  - monitor  Ophthalmic Meds Ordered this visit:  Meds ordered this encounter  Medications  . Bevacizumab (AVASTIN) SOLN 1.25 mg   Return in about 6 weeks (around 07/04/2019) for f/u exu ARMD OS, DFE, OCT.  There are no Patient Instructions on file for this visit.   This document serves as a record of services personally performed by Gardiner Sleeper, MD, PhD. It was created on their behalf by Ernest Mallick, OA, an ophthalmic assistant. The creation of this record is the provider's dictation and/or activities during the visit.    Electronically signed by: Ernest Mallick, OA 04.28.2021 11:33 PM  Gardiner Sleeper, M.D., Ph.D. Diseases & Surgery of the Retina and [place] 05/23/2019   I have reviewed the above documentation for accuracy and completeness, and I agree with the above. Gardiner Sleeper, M.D., Ph.D. 05/24/19 11:33 PM   Abbreviations: M myopia (nearsighted); A astigmatism; H hyperopia (farsighted); P presbyopia; Mrx spectacle prescription;  CTL contact lenses; OD right eye; OS left eye; OU both eyes  XT exotropia; ET esotropia; PEK punctate epithelial keratitis; PEE punctate  epithelial erosions; DES dry eye syndrome; MGD meibomian gland dysfunction; ATs artificial tears; PFAT's preservative free artificial tears; [place] nuclear sclerotic cataract; PSC posterior subcapsular cataract; ERM epi-retinal membrane; PVD posterior vitreous detachment; RD retinal detachment; DM diabetes mellitus; DR diabetic retinopathy; NPDR non-proliferative diabetic retinopathy; PDR proliferative diabetic retinopathy; CSME clinically significant macular edema; DME diabetic macular edema; dbh dot blot hemorrhages; CWS cotton wool spot; POAG primary open angle glaucoma; C/D cup-to-disc ratio; HVF humphrey visual field; GVF goldmann visual  field; OCT optical coherence tomography; IOP intraocular pressure; BRVO Branch retinal vein occlusion; CRVO central retinal vein occlusion; CRAO central retinal artery occlusion; BRAO branch retinal artery occlusion; RT retinal tear; SB scleral buckle; PPV pars plana vitrectomy; VH Vitreous hemorrhage; PRP panretinal laser photocoagulation; IVK intravitreal kenalog; VMT vitreomacular traction; MH Macular hole;  NVD neovascularization of the disc; NVE neovascularization elsewhere; AREDS age related eye disease study; ARMD age related macular degeneration; POAG primary open angle glaucoma; EBMD epithelial/anterior basement membrane dystrophy; ACIOL anterior chamber intraocular lens; IOL intraocular lens; PCIOL posterior chamber intraocular lens; Phaco/IOL phacoemulsification with intraocular lens placement; [place] photorefractive keratectomy; LASIK laser assisted in situ keratomileusis; HTN hypertension; DM diabetes mellitus; COPD chronic obstructive pulmonary disease

## 2019-05-23 ENCOUNTER — Ambulatory Visit (INDEPENDENT_AMBULATORY_CARE_PROVIDER_SITE_OTHER): Payer: Medicare Other | Admitting: Ophthalmology

## 2019-05-23 ENCOUNTER — Encounter (INDEPENDENT_AMBULATORY_CARE_PROVIDER_SITE_OTHER): Payer: Self-pay | Admitting: Ophthalmology

## 2019-05-23 DIAGNOSIS — I10 Essential (primary) hypertension: Secondary | ICD-10-CM | POA: Diagnosis not present

## 2019-05-23 DIAGNOSIS — H353112 Nonexudative age-related macular degeneration, right eye, intermediate dry stage: Secondary | ICD-10-CM

## 2019-05-23 DIAGNOSIS — E119 Type 2 diabetes mellitus without complications: Secondary | ICD-10-CM | POA: Diagnosis not present

## 2019-05-23 DIAGNOSIS — H3581 Retinal edema: Secondary | ICD-10-CM | POA: Diagnosis not present

## 2019-05-23 DIAGNOSIS — H353221 Exudative age-related macular degeneration, left eye, with active choroidal neovascularization: Secondary | ICD-10-CM

## 2019-05-23 DIAGNOSIS — H35033 Hypertensive retinopathy, bilateral: Secondary | ICD-10-CM

## 2019-05-23 DIAGNOSIS — Z961 Presence of intraocular lens: Secondary | ICD-10-CM

## 2019-05-24 MED ORDER — BEVACIZUMAB CHEMO INJECTION 1.25MG/0.05ML SYRINGE FOR KALEIDOSCOPE
1.2500 mg | INTRAVITREAL | Status: AC | PRN
  Administered 2019-05-24: 1.25 mg via INTRAVITREAL

## 2019-05-30 ENCOUNTER — Encounter: Payer: Self-pay | Admitting: Family Medicine

## 2019-05-30 ENCOUNTER — Other Ambulatory Visit: Payer: Self-pay

## 2019-05-30 ENCOUNTER — Ambulatory Visit (INDEPENDENT_AMBULATORY_CARE_PROVIDER_SITE_OTHER): Payer: Medicare Other | Admitting: Family Medicine

## 2019-05-30 DIAGNOSIS — M25511 Pain in right shoulder: Secondary | ICD-10-CM | POA: Diagnosis not present

## 2019-05-30 DIAGNOSIS — G8929 Other chronic pain: Secondary | ICD-10-CM

## 2019-05-30 NOTE — Progress Notes (Signed)
Office Visit Note   Patient: Sue [name]           Date of Birth: Jun 20, 1932           MRN: [id number] Visit Date: 05/30/2019 Requested by: Sue [name], [address] [place] [address] [place],  [place] [address] PCP: Sue Seashore, MD  Subjective: Chief Complaint  Patient presents with  . Right Shoulder - Follow-up    HPI: She is here with persistent right shoulder pain.  It has been about 6 months since the onset of her pain.  She has difficulty with activities of daily living around her house.  Pain is on the posterior lateral aspect.              ROS:   All other systems were reviewed and are negative.  Objective: Vital Signs: There were no vitals taken for this visit.  Physical Exam:  General:  Alert and oriented, in no acute distress. Pulm:  Breathing unlabored. Psy:  Normal mood, congruent affect.  Right shoulder: Full passive range of motion, no adhesive capsulitis.  She has weakness with external rotation against resistance as well as pain.  She has pain with supraspinatus testing but still good strength.  No significant pain with speeds test or with internal rotation.  No tenderness at the [facility] joint.  Moderate tenderness in the posterior subacromial space.  Imaging: No results found.  Assessment & Plan: 1.  Persistent right shoulder pain, concerning for infraspinatus tear. -Discussed options with her, she would like to avoid surgery if possible.  She wants to try cortisone injection.  If she fails to improve, she will call me next week and I will order MRI scan.     Procedures: Right shoulder subacromial injection: After sterile prep with Betadine, injected 5 cc 1% lidocaine without epinephrine and 40 mg methylprednisolone from posterior approach into subacromial space.    PMFS History: Patient Active Problem List   Diagnosis Date Noted  . Gastroenteritis due to COVID-19 virus 12/21/2018  . Acute renal failure superimposed on stage 3b chronic  kidney disease ([place]) 12/21/2018  . Diabetes mellitus type 2, uncontrolled, with complications ([place]) Q000111Q  . HLD (hyperlipidemia) 12/20/2018  . Anxiety 12/20/2018  . Depression 12/20/2018  . Acute respiratory disease due to COVID-19 virus 12/17/2018  . Dehydration 12/17/2018  . Essential hypertension 12/17/2018  . Type 1 diabetes mellitus without complication ([place]) 123XX123  . Diabetic ulcer of foot associated with diabetes mellitus due to underlying condition, limited to breakdown of skin ([place]) 01/11/2018  . Right hip pain 04/15/2016  . Right buttock pain 04/15/2016  . Primary osteoarthritis of right hip 04/15/2016   Past Medical History:  Diagnosis Date  . Basal cell carcinoma 11/23/2017   left elbow, right neck TX CX3 5FU   . Basal cell carcinoma 04/04/2019   infil-right sideburn (CX35FU)  . Diabetes mellitus without complication ([place])   . Hypertension   . Hypertensive retinopathy    OU  . Macular degeneration    OU  . SCC (squamous cell carcinoma) 11/23/2017   right forehead TX CX3 5FU    History reviewed. No pertinent family history.  Past Surgical History:  Procedure Laterality Date  . ABDOMINAL HYSTERECTOMY    . CATARACT EXTRACTION    . CHOLECYSTECTOMY    . EYE SURGERY    . SPINE SURGERY     Social History   Occupational History  . Not on file  Tobacco Use  . Smoking status: Former Research scientist (life sciences)  .  Smokeless tobacco: Never Used  Substance and Sexual Activity  . Alcohol use: No    Alcohol/week: 0.0 standard drinks  . Drug use: No  . Sexual activity: Not on file

## 2019-05-30 NOTE — Progress Notes (Signed)
Not any better

## 2019-06-06 ENCOUNTER — Encounter: Payer: Self-pay | Admitting: Family Medicine

## 2019-06-06 DIAGNOSIS — G8929 Other chronic pain: Secondary | ICD-10-CM

## 2019-06-06 DIAGNOSIS — M25511 Pain in right shoulder: Secondary | ICD-10-CM

## 2019-06-13 DIAGNOSIS — E1165 Type 2 diabetes mellitus with hyperglycemia: Secondary | ICD-10-CM | POA: Diagnosis not present

## 2019-06-13 DIAGNOSIS — E782 Mixed hyperlipidemia: Secondary | ICD-10-CM | POA: Diagnosis not present

## 2019-06-13 DIAGNOSIS — E1121 Type 2 diabetes mellitus with diabetic nephropathy: Secondary | ICD-10-CM | POA: Diagnosis not present

## 2019-06-13 DIAGNOSIS — I129 Hypertensive chronic kidney disease with stage 1 through stage 4 chronic kidney disease, or unspecified chronic kidney disease: Secondary | ICD-10-CM | POA: Diagnosis not present

## 2019-06-20 ENCOUNTER — Ambulatory Visit: Payer: Medicare Other | Admitting: Physical Therapy

## 2019-06-20 ENCOUNTER — Encounter: Payer: Self-pay | Admitting: Physical Therapy

## 2019-06-20 ENCOUNTER — Other Ambulatory Visit: Payer: Self-pay

## 2019-06-20 DIAGNOSIS — M25511 Pain in right shoulder: Secondary | ICD-10-CM | POA: Diagnosis not present

## 2019-06-20 DIAGNOSIS — M25611 Stiffness of right shoulder, not elsewhere classified: Secondary | ICD-10-CM

## 2019-06-20 DIAGNOSIS — I129 Hypertensive chronic kidney disease with stage 1 through stage 4 chronic kidney disease, or unspecified chronic kidney disease: Secondary | ICD-10-CM | POA: Diagnosis not present

## 2019-06-20 DIAGNOSIS — M6281 Muscle weakness (generalized): Secondary | ICD-10-CM | POA: Diagnosis not present

## 2019-06-20 DIAGNOSIS — E1121 Type 2 diabetes mellitus with diabetic nephropathy: Secondary | ICD-10-CM | POA: Diagnosis not present

## 2019-06-20 DIAGNOSIS — E1165 Type 2 diabetes mellitus with hyperglycemia: Secondary | ICD-10-CM | POA: Diagnosis not present

## 2019-06-20 DIAGNOSIS — E782 Mixed hyperlipidemia: Secondary | ICD-10-CM | POA: Diagnosis not present

## 2019-06-20 NOTE — Therapy (Signed)
[facility] [address] [place], [place], [address] Phone: [phone]   Fax:  [phone]  Physical Therapy Evaluation  Patient Details  Name: Sue [name] MRN: [id number] Date of Birth: 1932-09-10 Referring Provider (PT): Eunice Blase, MD   Encounter Date: 06/20/2019  PT End of Session - 06/20/19 1117    Visit Number  1    Number of Visits  13    Date for PT Re-Evaluation  08/03/19    Authorization Type  UHC and Medicare, KX Modifier after 15 visits, PN at 10th visit    Progress Note Due on Visit  10    PT Start Time  1100    PT Stop Time  1140    PT Time Calculation (min)  40 min    Activity Tolerance  Patient tolerated treatment well    Behavior During Therapy  [facility] for tasks assessed/performed       Past Medical History:  Diagnosis Date  . Basal cell carcinoma 11/23/2017   left elbow, right neck TX CX3 5FU   . Basal cell carcinoma 04/04/2019   infil-right sideburn (CX35FU)  . Diabetes mellitus without complication ([place])   . Hypertension   . Hypertensive retinopathy    OU  . Macular degeneration    OU  . SCC (squamous cell carcinoma) 11/23/2017   right forehead TX CX3 5FU    Past Surgical History:  Procedure Laterality Date  . ABDOMINAL HYSTERECTOMY    . CATARACT EXTRACTION    . CHOLECYSTECTOMY    . EYE SURGERY    . SPINE SURGERY      There were no vitals filed for this visit.   Subjective Assessment - 06/20/19 1107    Subjective  Pt arriving to therpay reporting recent fall at home and new complaints of shoulder pain. Pt reporting she turned while doing exericses and felt a pop in her shoulder and she has been having pain since. Pt also reporting a second event of falling and thinks she made it worse.    Pertinent History  DM, HTN, macular degeneration, spine surgery almost 20 years ago, eye surgery, gall bladder, hyserectomy, hernia repair, callus on R foot midfoot that was recently shaved    Patient Stated Goals   "Stop hurting"    Currently in Pain?  No/denies    Aggravating Factors   reaching upward, taking a shower    Pain Relieving Factors  resting    Effect of Pain on Daily Activities  unable to put eye drops in my eyes without using my other arm to lift my R shoulder, taking a shower is hard         [facility] PT Assessment - 06/20/19 0001      Assessment   Medical Diagnosis  R shoulder pain  M25.511    Referring Provider (PT)  Hilts, Michael, MD    Hand Dominance  Right    Prior Therapy  HHPT      Precautions   Precautions  Fall    Precaution Comments  fall and LOB reported      Restrictions   Weight Bearing Restrictions  No      Balance Screen   Has the patient fallen in the past 6 months  Yes    How many times?  1 and LOB reported    Has the patient had a decrease in activity level because of a fear of falling?   Yes    Is the patient reluctant to leave their  home because of a fear of falling?   No      Home Environment   Living Environment  Private residence    Living Arrangements  Alone    Additional Comments  Pt is getting ready to move in with her daughter      Prior Function   Level of Independence  Independent with household mobility with device    Vocation  Retired    Leisure  getting together with family      Cognition   Overall Cognitive Status  Within Functional Limits for tasks assessed      Posture/Postural Control   Posture/Postural Control  Postural limitations    Postural Limitations  Rounded Shoulders;Forward head;Increased thoracic kyphosis;Posterior pelvic tilt      ROM / Strength   AROM / PROM / Strength  AROM;Strength      AROM   AROM Assessment Site  Shoulder    Right/Left Shoulder  Right;Left    Right Shoulder Extension  25 Degrees    Right Shoulder Flexion  130 Degrees    Right Shoulder Internal Rotation  --   Thumb to T10 with pain noted on anterior capsule   Right Shoulder External Rotation  50 Degrees   pain reported   Left Shoulder  Extension  40 Degrees    Left Shoulder Flexion  158 Degrees    Left Shoulder Internal Rotation  --   thumb to T8 no pain reported   Left Shoulder External Rotation  60 Degrees      Strength   Overall Strength Comments  R grip: 21.5 ppsi (average of 3 trials), L 24.1 (average of 3 trials)    Strength Assessment Site  Shoulder    Right/Left Shoulder  Right;Left    Right Shoulder Flexion  3/5    Right Shoulder Extension  3-/5    Right Shoulder ABduction  2+/5    Right Shoulder Internal Rotation  3-/5    Right Shoulder External Rotation  3-/5    Left Shoulder Flexion  4-/5    Left Shoulder Extension  4-/5    Left Shoulder ABduction  3+/5    Left Shoulder Internal Rotation  4-/5    Left Shoulder External Rotation  4-/5      Palpation   Palpation comment  Pt with tightness noted in R Upper trap due to over compensation when lifting R shoulder. Also noted tenderness posterior lateral capsule      Special Tests    Special Tests  Rotator Cuff Impingement    Rotator Cuff Impingment tests  Empty Can test;Full Can test      Neer Impingement test    Findings  Positive    Side  Right      Empty Can test   Findings  Positive    Side  Right      Transfers   Five time sit to stand comments   36 seconds UE support      Ambulation/Gait   Gait Comments  Pt amb with straight cane with step through gait pattern with decreased step lengh, decreased foot clearance and using her cane in her R UE. Pt's cane was lowered 3 notches for better fit and pt was instructed to use the cane her L UE, due to having a walking shoe on R foot due to callus removal.                  Objective measurements completed on examination: See above findings.  [facility] Adult PT Treatment/Exercise - 06/20/19 0001      Exercises   Exercises  Shoulder      Shoulder Exercises: Seated   Other Seated Exercises  tables slides: flexion/ scaption / ER    Other Seated Exercises  Rows: red theraband x 10                    PT Long Term Goals - 06/20/19 1255      PT LONG TERM GOAL #1   Title  Pt will be independent in her HEP and progression.    Time  6    Period  Weeks    Status  New    Target Date  08/03/19      PT LONG TERM GOAL #2   Title  Pt will improve her R shoulder flexion to >/= 150 degrees with no pain reported.    Baseline  pain with flexion, 130 degrees    Time  6    Period  Weeks    Status  New      PT LONG TERM GOAL #3   Title  Pt will be able to lift 5# from counter height to above shoulder height with no pain reported.    Baseline  unable to currently    Time  6    Period  Weeks    Status  New      PT LONG TERM GOAL #4   Title  pt will be albe to improve R 5 time  sit to stand to </= 25 seconds using UE support as needed.    Baseline  see flow sheet    Time  6    Period  Weeks    Status  New             Plan - 06/20/19 1301    Clinical Impression Statement  Pt arriving for PT evaluation of R shoulder pain. Pt with tenderness to palpation in posterior lateral capsule. Pt reporting pain has been ongoing after almost fall and fall. Pt was dx with COVID-19 at end of 2020 and was hospitalized for about 1 week. Pt and daughter rerporting overall weakness since dx with a fall and LOB noted which aggrivated her R shoulder. Pt presenting with intermittent pain with shoulder movements. pt with decreased AROM in R shoulder with flexion of 130 degrees and ER of 50 degrees. Pt also with strength deficits of grossly 3/5 with MMT. Positive impingment tests noted on R shoulder. Pt would benefit from skilled PT to progress toward her PLOF. I feel pt would also benefit from BERG balance test and balance exercises incoorporated into her therapy program.    Personal Factors and Comorbidities  Age;Comorbidity 3+    Comorbidities  DM, basel cell carcinoma, HTN, macula degeneration, spine surgery, eye surgery    Examination-Activity Limitations  Lift;Carry;Reach Overhead     Examination-Participation Restrictions  Community Activity;Other    Stability/Clinical Decision Making  Stable/Uncomplicated    Clinical Decision Making  Low    Rehab Potential  Good    PT Frequency  2x / week    PT Duration  6 weeks    PT Treatment/Interventions  Cryotherapy;ADLs/Self Care Home Management;Electrical Stimulation;Iontophoresis 4mg /ml Dexamethasone;Moist Heat;Functional mobility training;Stair training;Gait training;Therapeutic activities;Therapeutic exercise;Balance training;Neuromuscular re-education;Patient/family education;Manual techniques;Passive range of motion;Dry needling;Taping    PT Next Visit Plan  BERG balance test and create goal, pulleys, shoulder stretching, UE strengthening, postural strengthening, gait training using straight cane,    PT  Home Exercise Plan  [email or web address] : Access Code    Consulted and Agree with Plan of Care  Patient;Family member/caregiver    Family Member Consulted  daughter       Patient will benefit from skilled therapeutic intervention in order to improve the following deficits and impairments:  Pain, Postural dysfunction, Decreased strength, Decreased range of motion, Improper body mechanics, Decreased balance, Difficulty walking  Visit Diagnosis: Acute pain of right shoulder  Stiffness of right shoulder, not elsewhere classified  Muscle weakness (generalized)     Problem List Patient Active Problem List   Diagnosis Date Noted  . Gastroenteritis due to COVID-19 virus 12/21/2018  . Acute renal failure superimposed on stage 3b chronic kidney disease ([place]) 12/21/2018  . Diabetes mellitus type 2, uncontrolled, with complications ([place]) Q000111Q  . HLD (hyperlipidemia) 12/20/2018  . Anxiety 12/20/2018  . Depression 12/20/2018  . Acute respiratory disease due to COVID-19 virus 12/17/2018  . Dehydration 12/17/2018  . Essential hypertension 12/17/2018  . Type 1 diabetes mellitus without complication ([place]) 123XX123  . Diabetic  ulcer of foot associated with diabetes mellitus due to underlying condition, limited to breakdown of skin ([place]) 01/11/2018  . Right hip pain 04/15/2016  . Right buttock pain 04/15/2016  . Primary osteoarthritis of right hip 04/15/2016    Oretha Caprice, PT, MPT  06/20/2019, 3:09 PM  [facility] [address] [place], [place], [address] Phone: [phone]   Fax:  [phone]  Name: Sue [name] MRN: [id number] Date of Birth: [birth year]

## 2019-06-20 NOTE — Patient Instructions (Signed)
Access Code: [email or web address] URL: [email or web address] Date: 06/20/2019 Prepared by: Kearney Hard  Exercises Seated Shoulder Scaption Slide at Table Top with Forearm in Neutral - 2 x daily - 7 x weekly - 2 sets - 10 reps - 5-10 seconds hold Seated Bilateral Shoulder Flexion Towel Slide at Table Top - 2 x daily - 7 x weekly - 2 sets - 10 reps - 5-10 seconds hold Seated Shoulder External Rotation PROM on Table - 2 x daily - 7 x weekly - 2 sets - 10 reps - 5-10 seconds hold Standing Shoulder Row with Anchored Resistance - 2 x daily - 7 x weekly - 2 sets - 10 reps - 3 seconds hold

## 2019-06-26 ENCOUNTER — Other Ambulatory Visit: Payer: Self-pay

## 2019-06-26 ENCOUNTER — Encounter: Payer: Self-pay | Admitting: Podiatry

## 2019-06-26 ENCOUNTER — Ambulatory Visit: Payer: Medicare Other | Admitting: Podiatry

## 2019-06-26 VITALS — Temp 97.9°F

## 2019-06-26 DIAGNOSIS — E1142 Type 2 diabetes mellitus with diabetic polyneuropathy: Secondary | ICD-10-CM | POA: Diagnosis not present

## 2019-06-26 DIAGNOSIS — E08621 Diabetes mellitus due to underlying condition with foot ulcer: Secondary | ICD-10-CM

## 2019-06-26 DIAGNOSIS — M79675 Pain in left toe(s): Secondary | ICD-10-CM | POA: Diagnosis not present

## 2019-06-26 DIAGNOSIS — M2042 Other hammer toe(s) (acquired), left foot: Secondary | ICD-10-CM

## 2019-06-26 DIAGNOSIS — L97511 Non-pressure chronic ulcer of other part of right foot limited to breakdown of skin: Secondary | ICD-10-CM | POA: Diagnosis not present

## 2019-06-26 DIAGNOSIS — E119 Type 2 diabetes mellitus without complications: Secondary | ICD-10-CM

## 2019-06-26 DIAGNOSIS — B351 Tinea unguium: Secondary | ICD-10-CM

## 2019-06-26 DIAGNOSIS — M2041 Other hammer toe(s) (acquired), right foot: Secondary | ICD-10-CM

## 2019-06-26 DIAGNOSIS — L97512 Non-pressure chronic ulcer of other part of right foot with fat layer exposed: Secondary | ICD-10-CM

## 2019-06-26 DIAGNOSIS — M79674 Pain in right toe(s): Secondary | ICD-10-CM | POA: Diagnosis not present

## 2019-06-26 MED ORDER — MUPIROCIN 2 % EX OINT: TOPICAL_OINTMENT | CUTANEOUS | 1 refills | Status: AC

## 2019-06-26 NOTE — Patient Instructions (Addendum)
DRESSING CHANGES RIGHT FOOT:  WEAR SURGICAL SHOE AT ALL TIMES    1. KEEP RIGHT  FOOT DRY AT ALL TIMES!!!!  2. CLEANSE ULCER WITH SALINE.  3. DAB DRY WITH GAUZE SPONGE.  4. APPLY A LIGHT AMOUNT OF MUPIROCIN OINTMENT TO BASE OF ULCER.  5. APPLY OUTER FABRIC /BAND-AID AS INSTRUCTED.  6. WEAR SURGICAL SHOE DAILY AT ALL TIMES.  7. DO NOT WALK BAREFOOT!!!  8.  IF YOU EXPERIENCE ANY FEVER, CHILLS, NIGHTSWEATS, NAUSEA OR VOMITING, ELEVATED OR LOW BLOOD SUGARS, REPORT TO EMERGENCY ROOM.  9. IF YOU EXPERIENCE INCREASED REDNESS, PAIN, SWELLING, DISCOLORATION, ODOR, PUS, DRAINAGE OR WARMTH OF YOUR FOOT, REPORT TO EMERGENCY ROOM.  Diabetes Mellitus and Foot Care Foot care is an important part of your health, especially when you have diabetes. Diabetes may cause you to have problems because of poor blood flow (circulation) to your feet and legs, which can cause your skin to:  Become thinner and drier.  Break more easily.  Heal more slowly.  Peel and crack. You may also have nerve damage (neuropathy) in your legs and feet, causing decreased feeling in them. This means that you may not notice minor injuries to your feet that could lead to more serious problems. Noticing and addressing any potential problems early is the best way to prevent future foot problems. How to care for your feet Foot hygiene  Wash your feet daily with warm water and mild soap. Do not use hot water. Then, pat your feet and the areas between your toes until they are completely dry. Do not soak your feet as this can dry your skin.  Trim your toenails straight across. Do not dig under them or around the cuticle. File the edges of your nails with an emery board or nail file.  Apply a moisturizing lotion or petroleum jelly to the skin on your feet and to dry, brittle toenails. Use lotion that does not contain alcohol and is unscented. Do not apply lotion between your toes. Shoes and socks  Wear clean socks or stockings  every day. Make sure they are not too tight. Do not wear knee-high stockings since they may decrease blood flow to your legs.  Wear shoes that fit properly and have enough cushioning. Always look in your shoes before you put them on to be sure there are no objects inside.  To break in new shoes, wear them for just a few hours a day. This prevents injuries on your feet. Wounds, scrapes, corns, and calluses  Check your feet daily for blisters, cuts, bruises, sores, and redness. If you cannot see the bottom of your feet, use a mirror or ask someone for help.  Do not cut corns or calluses or try to remove them with medicine.  If you find a minor scrape, cut, or break in the skin on your feet, keep it and the skin around it clean and dry. You may clean these areas with mild soap and water. Do not clean the area with peroxide, alcohol, or iodine.  If you have a wound, scrape, corn, or callus on your foot, look at it several times a day to make sure it is healing and not infected. Check for: ? Redness, swelling, or pain. ? Fluid or blood. ? Warmth. ? Pus or a bad smell. General instructions  Do not cross your legs. This may decrease blood flow to your feet.  Do not use heating pads or hot water bottles on your feet. They may burn your  skin. If you have lost feeling in your feet or legs, you may not know this is happening until it is too late.  Protect your feet from hot and cold by wearing shoes, such as at the beach or on hot pavement.  Schedule a complete foot exam at least once a year (annually) or more often if you have foot problems. If you have foot problems, report any cuts, sores, or bruises to your health care provider immediately. Contact a health care provider if:  You have a medical condition that increases your risk of infection and you have any cuts, sores, or bruises on your feet.  You have an injury that is not healing.  You have redness on your legs or feet.  You feel  burning or tingling in your legs or feet.  You have pain or cramps in your legs and feet.  Your legs or feet are numb.  Your feet always feel cold.  You have pain around a toenail. Get help right away if:  You have a wound, scrape, corn, or callus on your foot and: ? You have pain, swelling, or redness that gets worse. ? You have fluid or blood coming from the wound, scrape, corn, or callus. ? Your wound, scrape, corn, or callus feels warm to the touch. ? You have pus or a bad smell coming from the wound, scrape, corn, or callus. ? You have a fever. ? You have a red line going up your leg. Summary  Check your feet every day for cuts, sores, red spots, swelling, and blisters.  Moisturize feet and legs daily.  Wear shoes that fit properly and have enough cushioning.  If you have foot problems, report any cuts, sores, or bruises to your health care provider immediately.  Schedule a complete foot exam at least once a year (annually) or more often if you have foot problems. This information is not intended to replace advice given to you by your health care provider. Make sure you discuss any questions you have with your health care provider. Document Revised: 10/04/2018 Document Reviewed: 02/13/2016 Elsevier Patient Education  [place].

## 2019-06-27 ENCOUNTER — Ambulatory Visit (INDEPENDENT_AMBULATORY_CARE_PROVIDER_SITE_OTHER): Payer: Medicare Other | Admitting: Rehabilitative and Restorative Service Providers"

## 2019-06-27 ENCOUNTER — Encounter: Payer: Self-pay | Admitting: Rehabilitative and Restorative Service Providers"

## 2019-06-27 DIAGNOSIS — M25511 Pain in right shoulder: Secondary | ICD-10-CM

## 2019-06-27 DIAGNOSIS — M25611 Stiffness of right shoulder, not elsewhere classified: Secondary | ICD-10-CM

## 2019-06-27 DIAGNOSIS — M6281 Muscle weakness (generalized): Secondary | ICD-10-CM | POA: Diagnosis not present

## 2019-06-27 NOTE — Therapy (Signed)
[facility] [address] [place], [place], [address] Phone: [phone]   Fax:  [phone]  Physical Therapy Treatment  Patient Details  Name: Sue [name] MRN: [id number] Date of Birth: [birth year] Referring Provider (PT): Eunice Blase, MD   Encounter Date: 06/27/2019  PT End of Session - 06/27/19 1513    Visit Number  2    Number of Visits  13    Date for PT Re-Evaluation  08/03/19    Authorization Type  UHC and Medicare, [place] after 15 visits, PN at 10th visit    Progress Note Due on Visit  10    PT Start Time  1513    PT Stop Time  1552    PT Time Calculation (min)  39 min    Activity Tolerance  Patient tolerated treatment well    Behavior During Therapy  [facility] for tasks assessed/performed       Past Medical History:  Diagnosis Date  . Basal cell carcinoma 11/23/2017   left elbow, right neck TX CX3 5FU   . Basal cell carcinoma 04/04/2019   infil-right sideburn (CX35FU)  . Diabetes mellitus without complication ([place])   . Hypertension   . Hypertensive retinopathy    OU  . Macular degeneration    OU  . SCC (squamous cell carcinoma) 11/23/2017   right forehead TX CX3 5FU    Past Surgical History:  Procedure Laterality Date  . ABDOMINAL HYSTERECTOMY    . CATARACT EXTRACTION    . CHOLECYSTECTOMY    . EYE SURGERY    . SPINE SURGERY      There were no vitals filed for this visit.  Subjective Assessment - 06/27/19 1518    Subjective  Pt. indicated seeing MD about foot yesterday.  Pt. indicated she was moving and having to reach and carry items at times.  Pt. stated some pain c movement.    Pertinent History  DM, HTN, macular degeneration, spine surgery almost 20 years ago, eye surgery, gall bladder, hyserectomy, hernia repair, callus on R foot midfoot that was recently shaved    Patient Stated Goals  "Stop hurting"    Currently in Pain?  Yes    Pain Score  4     Pain Location  Shoulder    Pain Orientation  Right    Pain Descriptors / Indicators  Aching    Aggravating Factors   reaching back    Pain Relieving Factors  rest, injection                        [facility] Adult PT Treatment/Exercise - 06/27/19 0001      Therapeutic Activites    Therapeutic Activities  Other Therapeutic Activities    Other Therapeutic Activities  sit to stand from 21 inches x 10      Shoulder Exercises: Supine   Other Supine Exercises  supine prom flexion c Lt UE help x 20      Shoulder Exercises: Seated   Extension  Both   3 x 10 green band   Theraband Level (Shoulder Extension)  Level 3 (Green)    Row  AROM;Other (comment)   green 3 x 10   Theraband Level (Shoulder Row)  Level 3 (Green)      Shoulder Exercises: ROM/Strengthening   Other ROM/Strengthening Exercises  seated UE ranger on step flexion x 10, abd x 10  PT Long Term Goals - 06/20/19 1255      PT LONG TERM GOAL #1   Title  Pt will be independent in her HEP and progression.    Time  6    Period  Weeks    Status  New    Target Date  08/03/19      PT LONG TERM GOAL #2   Title  Pt will improve her R shoulder flexion to >/= 150 degrees with no pain reported.    Baseline  pain with flexion, 130 degrees    Time  6    Period  Weeks    Status  New      PT LONG TERM GOAL #3   Title  Pt will be able to lift 5# from counter height to above shoulder height with no pain reported.    Baseline  unable to currently    Time  6    Period  Weeks    Status  New      PT LONG TERM GOAL #4   Title  pt will be albe to improve R 5 time  sit to stand to </= 25 seconds using UE support as needed.    Baseline  see flow sheet    Time  6    Period  Weeks    Status  New            Plan - 06/27/19 1521    Clinical Impression Statement  Persistent cues for intervention required to improve performance and adherence to goals. Variable end range symptoms noted in [place]t [place] jt mobility at this time.  Ambulation c SPC c varable  sequencing c boot on foot, limiting balance intervention/assessment.  Passive elevation showing good progress c reduced complaints but active and active assisted elevation notably painful.    Personal Factors and Comorbidities  Age;Comorbidity 3+    Comorbidities  DM, basel cell carcinoma, HTN, macula degeneration, spine surgery, eye surgery    Examination-Activity Limitations  Lift;Carry;Reach Overhead    Examination-Participation Restrictions  Community Activity;Other    Stability/Clinical Decision Making  Stable/Uncomplicated    Rehab Potential  Good    PT Frequency  2x / week    PT Duration  6 weeks    PT Treatment/Interventions  Cryotherapy;ADLs/Self Care Home Management;Electrical Stimulation;Iontophoresis 4mg /ml Dexamethasone;Moist Heat;Functional mobility training;Stair training;Gait training;Therapeutic activities;Therapeutic exercise;Balance training;Neuromuscular re-education;Patient/family education;Manual techniques;Passive range of motion;Dry needling;Taping    PT Next Visit Plan  Balance as appropriate, specifically when boot is removed. Rt gh mobility/strengthening.    PT Home Exercise Plan  [id number] : Access Code    Consulted and Agree with Plan of Care  Patient;Family member/caregiver    Family Member Consulted  daughter       Patient will benefit from skilled therapeutic intervention in order to improve the following deficits and impairments:  Pain, Postural dysfunction, Decreased strength, Decreased range of motion, Improper body mechanics, Decreased balance, Difficulty walking  Visit Diagnosis: Acute pain of right shoulder  Stiffness of right shoulder, not elsewhere classified  Muscle weakness (generalized)     Problem List Patient Active Problem List   Diagnosis Date Noted  . Gastroenteritis due to COVID-19 virus 12/21/2018  . Acute renal failure superimposed on stage 3b chronic kidney disease ([place]) 12/21/2018  . Diabetes mellitus type 2, uncontrolled, with  complications ([place]) [id number]  . HLD (hyperlipidemia) 12/20/2018  . Anxiety 12/20/2018  . Depression 12/20/2018  . Acute respiratory disease due to COVID-19 virus 12/17/2018  . Dehydration 12/17/2018  .  Essential hypertension 12/17/2018  . Type 1 diabetes mellitus without complication ([place]) [id number]  . Diabetic ulcer of foot associated with diabetes mellitus due to underlying condition, limited to breakdown of skin ([place]) 01/11/2018  . Right hip pain 04/15/2016  . Right buttock pain 04/15/2016  . Primary osteoarthritis of right hip 04/15/2016   Scot Jun, PT, DPT, OCS, ATC 06/27/19  3:47 PM    [facility] [address] [place], [place], [address] Phone: [phone]   Fax:  [phone]  Name: BRYCE FEVER MRN: [id number] Date of Birth: 1932-04-10

## 2019-07-01 NOTE — Progress Notes (Signed)
Subjective:  Patient ID: Sue [name], female    DOB: 1933-01-06,  MRN: [id number]  86 y.o. female is seen for chief concern of breakdown of previously healed ulceration plantar right foot.  Her granddaughter is present during today's visit. Patient also states she is waiting on new diabetic shoes.  Ulceration location:  submet head 2 right foot.  Offloading achieved with diabetic shoes in the past. She is waiting on a new pair of diabetic shoes.  Patient relates wound is worse than previous visit.  Hx confirmed with Sue [name].  Patient denies fever, chills, night sweats, nausea, or vomiting.  Past Medical History:  Diagnosis Date  . Basal cell carcinoma 11/23/2017   left elbow, right neck TX CX3 5FU   . Basal cell carcinoma 04/04/2019   infil-right sideburn (CX35FU)  . Diabetes mellitus without complication ([place])   . Hypertension   . Hypertensive retinopathy    OU  . Macular degeneration    OU  . SCC (squamous cell carcinoma) 11/23/2017   right forehead TX CX3 5FU     Past Surgical History:  Procedure Laterality Date  . ABDOMINAL HYSTERECTOMY    . CATARACT EXTRACTION    . CHOLECYSTECTOMY    . EYE SURGERY    . SPINE SURGERY       Current Outpatient Medications on File Prior to Visit  Medication Sig Dispense Refill  . acetaminophen (TYLENOL) 325 MG tablet Take 2 tablets (650 mg total) by mouth every 6 (six) hours as needed for mild pain or headache (fever >/= 101). 10 tablet 0  . amLODipine (NORVASC) 10 MG tablet Take 10 mg by mouth every morning.     . B-D ULTRAFINE III SHORT PEN 31G X 8 MM MISC USE AS DIRECTED ONCE DAILY SUBCUTANEOUSLY  11  . calcium-vitamin D (OSCAL WITH D) 500-200 MG-UNIT tablet Take 1 tablet by mouth daily with lunch.     Marland Kitchen glucose blood test strip OneTouch Ultra Test strips  TEST ONCE A DAY ONCE A DAY FINGERSTICK 90    . hydrALAZINE (APRESOLINE) 25 MG tablet Take 3 tablets (75 mg total) by mouth every 8 (eight) hours. 270 tablet 0  .  hydrochlorothiazide (HYDRODIURIL) 25 MG tablet Take 25 mg by mouth daily.    . Insulin Degludec (TRESIBA FLEXTOUCH) 200 UNIT/ML SOPN Inject 36 Units into the skin at bedtime. 3 pen 0  . Insulin Pen Needle (PEN NEEDLES) 32G X 4 MM MISC BD Ultra-Fine Nano Pen Needle 32 gauge x 5/32"  USE AS DIRECTED DAILY    . JANUVIA 25 MG tablet Take 25 mg by mouth daily.    . Lancets (ONETOUCH ULTRASOFT) lancets OneTouch UltraSoft Lancets  USE TWICE DAILY    . meloxicam (MOBIC) 7.5 MG tablet Take 1 tablet (7.5 mg total) by mouth daily as needed for pain. (Patient not taking: Reported on 05/08/2019) 30 tablet 3  . Multiple Vitamins-Minerals (ADULT ONE DAILY GUMMIES) CHEW Chew 2 tablets by mouth daily with lunch. Centrum    . Multiple Vitamins-Minerals (AIRBORNE) TBEF Take 1 tablet by mouth daily with lunch.    . Multiple Vitamins-Minerals (PRESERVISION AREDS 2) CAPS Take 1 capsule by mouth 2 (two) times daily.    . nortriptyline (PAMELOR) 25 MG capsule Take 1 capsule (25 mg total) by mouth at bedtime. 30 capsule 0  . Omega-3 Fatty Acids (FISH OIL) 1200 MG CAPS Take 1,200 mg by mouth daily with lunch.     Marland Kitchen omeprazole (PRILOSEC) 20 MG capsule Take 20 mg  by mouth every morning.    . ONE TOUCH ULTRA TEST test strip     . Polyethyl Glycol-Propyl Glycol (SYSTANE) 0.4-0.3 % SOLN Place 1 drop into both eyes 3 (three) times daily as needed (dry eyes).    Marland Kitchen PRESCRIPTION MEDICATION by Intravitreal route every 30 (thirty) days. Ophthalmic injection at MD office in left eye    . RESTASIS 0.05 % ophthalmic emulsion 1 drop 2 (two) times daily.    . simvastatin (ZOCOR) 20 MG tablet Take 20 mg by mouth at bedtime.     . traMADol (ULTRAM) 50 MG tablet Take 1 tablet (50 mg total) by mouth 2 (two) times daily as needed. 20 tablet 0  . vitamin B-12 (CYANOCOBALAMIN) 1000 MCG tablet Take 1,000 mcg by mouth daily with lunch.     Current Facility-Administered Medications on File Prior to Visit  Medication Dose Route Frequency Provider  Last Rate Last Admin  . Bevacizumab (AVASTIN) SOLN 1.25 mg  1.25 mg Intravitreal  Bernarda Caffey, MD   1.25 mg at 03/01/18 1344  . Bevacizumab (AVASTIN) SOLN 1.25 mg  1.25 mg Intravitreal  Bernarda Caffey, MD   1.25 mg at 03/31/18 1449     No Known Allergies   History reviewed. No pertinent family history.   Social History   Occupational History  . Not on file  Tobacco Use  . Smoking status: Former Research scientist (life sciences)  . Smokeless tobacco: Never Used  Substance and Sexual Activity  . Alcohol use: No    Alcohol/week: 0.0 standard drinks  . Drug use: No  . Sexual activity: Not on file     Immunization History  Administered Date(s) Administered  . Influenza, High Dose Seasonal PF 11/28/2018  . PFIZER SARS-COV-2 Vaccination 04/02/2019, 05/02/2019     Review of systems: Positive Findings in bold print.  Constitutional:  chills, fatigue, fever, sweats, weight change Communication: Optometrist, sign Ecologist, hand writing, iPad/Android device Head: head injury Eyes: changes in vision, eye pain, glaucoma, cataracts, macular degeneration, diplopia, glare, light sensitivity, eyeglasses or contacts, blindness Ears nose mouth throat: hearing impaired, hearing aids,  ringing in ears, deaf, sign language,  vertigo, nosebleeds,  rhinitis,  cold sores, snoring, swollen glands Cardiovascular: HTN, edema, arrhythmia, pacemaker in place, defibrillator in place, chest pain/tightness, chronic anticoagulation, blood clot, heart failure, MI Peripheral Vascular: leg cramps, varicose veins, blood clots, lymphedema, varicosities Respiratory:  asthma, difficulty breathing, denies congestion, SOB, wheezing, cough, emphysema, oxygen dependent Gastrointestinal: change in appetite or weight, abdominal pain, constipation, diarrhea, nausea, vomiting, vomiting blood, change in bowel habits, abdominal pain, jaundice, rectal bleeding, hemorrhoids, GERD Genitourinary:  nocturia,  pain on urination, polyuria,  blood in  urine, Foley catheter, urinary urgency, ESRD on hemodialysis Musculoskeletal: amputation(s), cramping, stiff joints, painful joints, decreased joint motion, fractures, OA, gout, hemiplegia, paraplegia, uses cane, wheelchair bound, uses walker, uses rollator Skin: changes in toenails, color change, dryness, itching, mole changes,  rash, wound(s) Neurological: headaches, numbness in feet, paresthesias in feet, burning in feet, fainting,  seizures, change in speech. denies headaches, memory problems/poor historian, cerebral palsy, weakness, paralysis, CVA, TIA, tremors Endocrine: diabetes, hypothyroidism, hyperthyroidism,  goiter, dry mouth, flushing, heat intolerance,  cold intolerance,  excessive thirst, denies polyuria,  nocturia Hematological:  easy bleeding, excessive bleeding, easy bruising, enlarged lymph nodes, on long term blood thinner, history of past transusions Allergy/immunological:  hives, eczema, frequent infections, multiple drug allergies, seasonal allergies, transplant recipient, multiple food allergies Psychiatric:  anxiety, depression, mood disorder, suicidal ideations, hallucinations, insomnia   Objective:  Physical  Exam: Vitals:   06/26/19 1013  Temp: 97.9 F (36.6 C)     @PtName @ is a pleasant 86 y.o. female WD,WN in NAD. AAO x 3.  Vascular Examination: Neurovascular status unchanged b/l lower extremities. Capillary refill time to digits immediate b/l. Palpable DP pulses b/l. Palpable PT pulses b/l. Pedal hair absent b/l. Skin temperature gradient within normal limits b/l.  Dermatological Examination: Pedal skin with normal turgor, texture and tone bilaterally. No open wounds bilaterally. No interdigital macerations bilaterally. Toenails 1-5 b/l are painful, elongated, discolored, dystrophic with subungual debris. Pain with dorsal palpation of nailplates. No erythema, no edema, no drainage noted.     Wound Location: submet head 2 right foot There is a minimal amount  of devitalized hyperkeratotic tissue present in the wound. Predebridement Wound Measurement:  3.0 x 1.5 x 0 cm. Postdebridement Wound Measurement: 3.2 x 1.7 x 0.1 cm. Wound Base: Mixed Granular/Fibrotic Peri-wound: Calloused Exudate: None: wound tissue dry Blood Loss during debridement: 0 cc('s). Description of tissue removed from ulceration today: devitalized hyperkeratotic tissue. Signs of clinical bacterial infection are absent. Material in wound which inhibits healing/promotes adjacent tissue breakdown:  Devitalized hyperkeratotic tissue.   Wound Location: R 2nd toe There is moderate amount of preulcerative hyperkeratotic tissue present in the wound. Predebridement Wound Measurement:  1.0  x 1.0 x 0 cm. Postdebridement Wound Measurement: 1.0 x 1.0  x 0cm. Wound Base: epithelialized Peri-wound: Normal Exudate: None: wound tissue dry Blood Loss during debridement: 0 cc('s). Description of tissue removed from ulceration today: preulcerative hyperkeratosis. Signs of clinical bacterial infection are absent. Material in wound which inhibits healing/promotes adjacent tissue breakdown: N/A.  Musculoskeletal Examination: Normal muscle strength 5/5 to all lower extremity muscle groups bilaterally. No pain crepitus or joint limitation noted with ROM b/l. Hammertoes noted to the R 2nd toe. Utilizes rollator for ambulation assistance.  Neurological Examination: Protective sensation diminished with 10g monofilament b/l. Vibratory sensation diminished b/l.  Hemoglobin A1C Latest Ref Rng & Units 12/17/2018  HGBA1C 4.8 - 5.6 % 9.1(H)  Some recent data might be hidden    No results for input(s): GRAMSTAIN, LABORGA in the last 8760 hours.   Lab Results  Component Value Date   WBC 13.1 (H) 12/22/2018   HGB 10.4 (L) 12/22/2018   HCT 32.1 (L) 12/22/2018   MCV 85.8 12/22/2018   PLT 528 (H) 12/22/2018    Assessment:   1. Pain due to onychomycosis of toenails of both feet   2. Diabetic  ulcer of other part of right foot associated with diabetes mellitus due to underlying condition, limited to breakdown of skin ([place])   3. Diabetic peripheral neuropathy associated with type 2 diabetes mellitus ([place])   4. Acquired hammertoes of both feet   5. Encounter for diabetic foot exam ([place])    Plan:  -Diabetic foot examination performed today. -Toenails 1-5 b/l were debrided in length and girth with sterile nail nippers and dremel without iatrogenic bleeding.  -Patient was evaluated and treated and all questions answered.  -Patient/POA/Family member educated on diagnosis and treatment plan of routine ulcer debridement/wound care.  -Ulceration debridement achieved utilizing sharp debridement with sterile scalpel blade.. -Type/amount of devitalized tissue removed: devitalized hyperkeratotic tissue submet head 2 right foot.  -Today's ulcer size post-debridement: 3.2 x 1.7 x 0.1 cm. -Ulceration cleansed with wound cleanser. triple antibiotic ointment applied to base of ulceration and secured with light dressing. -Wound responded well to today's debridement. -Patient risk factors affecting healing of ulcer: diabetic neuropathy, previous history of ulceration,  foot deformity, uncontrolled diabetes. -Frequency of debridements needed to achieve healing: every 2 weeks. -Patient to continue diabetic shoe on contralateral foot. -Patient to report any pedal injuries to medical professional immediately. -Surgical shoe was dispensed for right foot. -Applied PegAssist insert to surgical shoe to offload ulcer. Patient to wear daily.  -Patient was given written instructions on offloading of ulceration and daily Mupirocin Ointment dressing changes. Strict orders were given to keep foot dry. Patient was instructed to call immediately if any signs or symptoms of infection arise. Prescription written for Mupirocin Ointment. Patient is to apply to right foot ulcer once daily and cover with dressing.  -Patient  instructed to report to emergency department with worsening appearance of ulcer/toe/foot, increased pain, foul odor, increased redness, swelling, drainage, fever, chills, nightsweats, nausea, vomiting, increased blood sugar.  -DME dispensed on today's visit: Darco shoe and Peg Assist for right diabetic foot ulcer. -Was able to discuss diabetic shoe status with Dawn Minor in International aid/development worker office. Still awaiting diabetic shoe certification signature from PCP. Granddaughter was given a copy of the paperwork to take to her Aunt (Ms. Wigle's daughter), who will get paperwork signed. -Patient/POA to call should there be question/concern in the interim.  -ADA Risk Category: Urgent with active pathology of diabetic ulceration, uncontrolled diabetes, diabetic neuropathy and foot deformity.  Return in about 2 weeks (around 07/10/2019) for diabetic nail and callus trim, follow up ulcer.  Marzetta Board, DPM

## 2019-07-03 ENCOUNTER — Ambulatory Visit (INDEPENDENT_AMBULATORY_CARE_PROVIDER_SITE_OTHER): Payer: Medicare Other | Admitting: Physical Therapy

## 2019-07-03 ENCOUNTER — Other Ambulatory Visit: Payer: Self-pay

## 2019-07-03 ENCOUNTER — Encounter: Payer: Self-pay | Admitting: Physical Therapy

## 2019-07-03 DIAGNOSIS — M25611 Stiffness of right shoulder, not elsewhere classified: Secondary | ICD-10-CM | POA: Diagnosis not present

## 2019-07-03 DIAGNOSIS — M6281 Muscle weakness (generalized): Secondary | ICD-10-CM | POA: Diagnosis not present

## 2019-07-03 DIAGNOSIS — M25511 Pain in right shoulder: Secondary | ICD-10-CM

## 2019-07-03 NOTE — Therapy (Signed)
[facility] [address] [place], [place], [address] Phone: [phone]   Fax:  [phone]  Physical Therapy Treatment  Patient Details  Name: Sue [name] MRN: [id number] Date of Birth: 1932/02/14 Referring Provider (PT): Eunice Blase, MD   Encounter Date: 07/03/2019  PT End of Session - 07/03/19 1511    Visit Number  3    Number of Visits  13    Date for PT Re-Evaluation  08/03/19    Authorization Type  UHC and Medicare, [place] after 15 visits, PN at 10th visit    Progress Note Due on Visit  10    PT Start Time  1435    PT Stop Time  1515    PT Time Calculation (min)  40 min    Activity Tolerance  Patient tolerated treatment well    Behavior During Therapy  [facility] for tasks assessed/performed       Past Medical History:  Diagnosis Date  . Basal cell carcinoma 11/23/2017   left elbow, right neck TX CX3 5FU   . Basal cell carcinoma 04/04/2019   infil-right sideburn (CX35FU)  . Diabetes mellitus without complication ([place])   . Hypertension   . Hypertensive retinopathy    OU  . Macular degeneration    OU  . SCC (squamous cell carcinoma) 11/23/2017   right forehead TX CX3 5FU    Past Surgical History:  Procedure Laterality Date  . ABDOMINAL HYSTERECTOMY    . CATARACT EXTRACTION    . CHOLECYSTECTOMY    . EYE SURGERY    . SPINE SURGERY      There were no vitals filed for this visit.  Subjective Assessment - 07/03/19 1452    Subjective  relays her shoulder is doing better, not hurting that much, does not give pain rating    Pertinent History  DM, HTN, macular degeneration, spine surgery almost 20 years ago, eye surgery, gall bladder, hyserectomy, hernia repair, callus on R foot midfoot that was recently shaved    Patient Stated Goals  "Stop hurting"                        [facility] Adult PT Treatment/Exercise - 07/03/19 0001      Shoulder Exercises: Seated   Other Seated Exercises  UE ranger  X10 flexion,  cirlces in flexion, and scaption,2X10 ea      Shoulder Exercises: Standing   External Rotation  Right    Theraband Level (Shoulder External Rotation)  Level 1 (Yellow)    External Rotation Limitations  2X10, heavy cues on technique    Internal Rotation  Right    Theraband Level (Shoulder Internal Rotation)  Level 1 (Yellow)    Internal Rotation Limitations  2x10, heavy cues on technique    Extension  Both    Theraband Level (Shoulder Extension)  Level 2 (Red)    Extension Limitations  2X10    Row  Both    Theraband Level (Shoulder Row)  Level 2 (Red)    Row Weight (lbs)  2X10      Shoulder Exercises: Pulleys   Flexion  2 minutes    ABduction  2 minutes      Shoulder Exercises: ROM/Strengthening   UBE (Upper Arm Bike)  2.5 min fwd, 2.5 min retro    Other ROM/Strengthening Exercises  Nu step UE/LE ROM and endurance 6 min L4  PT Long Term Goals - 06/20/19 1255      PT LONG TERM GOAL #1   Title  Pt will be independent in her HEP and progression.    Time  6    Period  Weeks    Status  New    Target Date  08/03/19      PT LONG TERM GOAL #2   Title  Pt will improve her R shoulder flexion to >/= 150 degrees with no pain reported.    Baseline  pain with flexion, 130 degrees    Time  6    Period  Weeks    Status  New      PT LONG TERM GOAL #3   Title  Pt will be able to lift 5# from counter height to above shoulder height with no pain reported.    Baseline  unable to currently    Time  6    Period  Weeks    Status  New      PT LONG TERM GOAL #4   Title  pt will be albe to improve R 5 time  sit to stand to </= 25 seconds using UE support as needed.    Baseline  see flow sheet    Time  6    Period  Weeks    Status  New            Plan - 07/03/19 1526    Clinical Impression Statement  Session focused on improving Rt shoulder ROM and strength as tolerated. She notes improvements in this with less pain since beginning PT. Continue POC.     Personal Factors and Comorbidities  Age;Comorbidity 3+    Comorbidities  DM, basel cell carcinoma, HTN, macula degeneration, spine surgery, eye surgery    Examination-Activity Limitations  Lift;Carry;Reach Overhead    Examination-Participation Restrictions  Community Activity;Other    Stability/Clinical Decision Making  Stable/Uncomplicated    Rehab Potential  Good    PT Frequency  2x / week    PT Duration  6 weeks    PT Treatment/Interventions  Cryotherapy;ADLs/Self Care Home Management;Electrical Stimulation;Iontophoresis 4mg /ml Dexamethasone;Moist Heat;Functional mobility training;Stair training;Gait training;Therapeutic activities;Therapeutic exercise;Balance training;Neuromuscular re-education;Patient/family education;Manual techniques;Passive range of motion;Dry needling;Taping    PT Next Visit Plan  Balance as appropriate, specifically when boot is removed. Rt gh mobility/strengthening.    PT Home Exercise Plan  [email or web address] : Access Code    Consulted and Agree with Plan of Care  Patient;Family member/caregiver    Family Member Consulted  daughter       Patient will benefit from skilled therapeutic intervention in order to improve the following deficits and impairments:  Pain, Postural dysfunction, Decreased strength, Decreased range of motion, Improper body mechanics, Decreased balance, Difficulty walking  Visit Diagnosis: Acute pain of right shoulder  Stiffness of right shoulder, not elsewhere classified  Muscle weakness (generalized)     Problem List Patient Active Problem List   Diagnosis Date Noted  . Gastroenteritis due to COVID-19 virus 12/21/2018  . Acute renal failure superimposed on stage 3b chronic kidney disease ([place]) 12/21/2018  . Diabetes mellitus type 2, uncontrolled, with complications ([place]) 15/17/6160  . HLD (hyperlipidemia) 12/20/2018  . Anxiety 12/20/2018  . Depression 12/20/2018  . Acute respiratory disease due to COVID-19 virus 12/17/2018  . Dehydration  12/17/2018  . Essential hypertension 12/17/2018  . Type 1 diabetes mellitus without complication ([place]) 73/71/0626  . Diabetic ulcer of foot associated with diabetes mellitus due to underlying condition, limited to breakdown  of skin ([place]) 01/11/2018  . Right hip pain 04/15/2016  . Right buttock pain 04/15/2016  . Primary osteoarthritis of right hip 04/15/2016    Silvestre Mesi 07/03/2019, 3:27 PM  [facility] [address] [place], [place], [address] Phone: [phone]   Fax:  [phone]  Name: Sue [name] MRN: [id number] Date of Birth: [birth year]

## 2019-07-03 NOTE — Progress Notes (Signed)
[facility] Note  07/04/2019   CHIEF COMPLAINT Patient presents for Retina Follow Up  HISTORY OF PRESENT ILLNESS: Sue [name] is a 86 y.o. female who presents to the clinic today for:   HPI    Retina Follow Up    Patient presents with  Wet AMD.  In left eye.  This started weeks ago.  Severity is moderate.  Duration of weeks.  Since onset it is stable.  I, the attending physician,  performed the HPI with the patient and updated documentation appropriately.          Comments    Pt states vision is about the same OU.  Pt denies eye pain or discomfort and denies any new or worsening floaters or fol OU.       Last edited by Bernarda Caffey, MD on 07/05/2019 11:18 PM. (History)    pt states  Referring physician: Merrilee Seashore, [place],   [id number]  HISTORICAL INFORMATION:   Selected notes from the MEDICAL RECORD NUMBER Referred by Dr. Quentin Ore for concern of ARMD OU    CURRENT MEDICATIONS: Current Outpatient Medications (Ophthalmic Drugs)  Medication Sig  . Polyethyl Glycol-Propyl Glycol (SYSTANE) 0.4-0.3 % SOLN Place 1 drop into both eyes 3 (three) times daily as needed (dry eyes).  . RESTASIS 0.05 % ophthalmic emulsion 1 drop 2 (two) times daily.   No current facility-administered medications for this visit. (Ophthalmic Drugs)   Current Outpatient Medications (Other)  Medication Sig  . acetaminophen (TYLENOL) 325 MG tablet Take 2 tablets (650 mg total) by mouth every 6 (six) hours as needed for mild pain or headache (fever >/= 101).  Marland Kitchen amLODipine (NORVASC) 10 MG tablet Take 10 mg by mouth every morning.   . B-D ULTRAFINE III SHORT PEN 31G X 8 MM MISC USE AS DIRECTED ONCE DAILY SUBCUTANEOUSLY  . calcium-vitamin D (OSCAL WITH D) 500-200 MG-UNIT tablet Take 1 tablet by mouth daily with lunch.   Marland Kitchen glucose blood test strip OneTouch Ultra Test strips  TEST ONCE A DAY ONCE A DAY FINGERSTICK 90  .  hydrALAZINE (APRESOLINE) 25 MG tablet Take 3 tablets (75 mg total) by mouth every 8 (eight) hours.  . hydrochlorothiazide (HYDRODIURIL) 25 MG tablet Take 25 mg by mouth daily.  . Insulin Degludec (TRESIBA FLEXTOUCH) 200 UNIT/ML SOPN Inject 36 Units into the skin at bedtime.  . Insulin Pen Needle (PEN NEEDLES) 32G X 4 MM MISC BD Ultra-Fine Nano Pen Needle 32 gauge x 5/32"  USE AS DIRECTED DAILY  . JANUVIA 25 MG tablet Take 25 mg by mouth daily.  . Lancets (ONETOUCH ULTRASOFT) lancets OneTouch UltraSoft Lancets  USE TWICE DAILY  . meloxicam (MOBIC) 7.5 MG tablet Take 1 tablet (7.5 mg total) by mouth daily as needed for pain. (Patient not taking: Reported on 05/08/2019)  . Multiple Vitamins-Minerals (ADULT ONE DAILY GUMMIES) CHEW Chew 2 tablets by mouth daily with lunch. Centrum  . Multiple Vitamins-Minerals (AIRBORNE) TBEF Take 1 tablet by mouth daily with lunch.  . Multiple Vitamins-Minerals (PRESERVISION AREDS 2) CAPS Take 1 capsule by mouth 2 (two) times daily.  . mupirocin ointment (BACTROBAN) 2 % Apply to right foot ulcer once daily.  . nortriptyline (PAMELOR) 25 MG capsule Take 1 capsule (25 mg total) by mouth at bedtime.  . Omega-3 Fatty Acids (FISH OIL) 1200 MG CAPS Take 1,200 mg by mouth daily with lunch.   Marland Kitchen omeprazole (PRILOSEC) 20 MG capsule Take 20  mg by mouth every morning.  . ONE TOUCH ULTRA TEST test strip   . PRESCRIPTION MEDICATION by Intravitreal route every 30 (thirty) days. Ophthalmic injection at MD office in left eye  . simvastatin (ZOCOR) 20 MG tablet Take 20 mg by mouth at bedtime.   . traMADol (ULTRAM) 50 MG tablet Take 1 tablet (50 mg total) by mouth 2 (two) times daily as needed.  . vitamin B-12 (CYANOCOBALAMIN) 1000 MCG tablet Take 1,000 mcg by mouth daily with lunch.   Current Facility-Administered Medications (Other)  Medication Route  . Bevacizumab (AVASTIN) SOLN 1.25 mg Intravitreal  . Bevacizumab (AVASTIN) SOLN 1.25 mg Intravitreal      REVIEW OF  SYSTEMS: ROS    Positive for: Genitourinary, Musculoskeletal, Endocrine, Eyes   Negative for: Constitutional, Gastrointestinal, Neurological, Skin, HENT, Cardiovascular, Respiratory, Psychiatric, Allergic/Imm, Heme/Lymph   Last edited by Doneen Poisson on 07/04/2019  1:50 PM. (History)       ALLERGIES No Known Allergies  PAST MEDICAL HISTORY Past Medical History:  Diagnosis Date  . Basal cell carcinoma 11/23/2017   left elbow, right neck TX CX3 5FU   . Basal cell carcinoma 04/04/2019   infil-right sideburn (CX35FU)  . Diabetes mellitus without complication ([place])   . Hypertension   . Hypertensive retinopathy    OU  . Macular degeneration    OU  . SCC (squamous cell carcinoma) 11/23/2017   right forehead TX CX3 5FU   Past Surgical History:  Procedure Laterality Date  . ABDOMINAL HYSTERECTOMY    . CATARACT EXTRACTION    . CHOLECYSTECTOMY    . EYE SURGERY    . SPINE SURGERY      FAMILY HISTORY History reviewed. No pertinent family history.  SOCIAL HISTORY Social History   Tobacco Use  . Smoking status: Former Research scientist (life sciences)  . Smokeless tobacco: Never Used  Vaping Use  . Vaping Use: Never used  Substance Use Topics  . Alcohol use: No    Alcohol/week: 0.0 standard drinks  . Drug use: No         OPHTHALMIC EXAM:  Base Eye Exam    Visual Acuity (Snellen - Linear)      Right Left   Dist cc 20/30 -2 20/50 -2   Dist ph cc NI 20/40 -2   Correction: Glasses       Tonometry (Tonopen, 1:54 PM)      Right Left   Pressure 14 11       Pupils      Dark Light Shape React APD   Right 4 3 Round Minimal 0   Left 4 3 Round Minimal 0       Visual Fields      Left Right    Full Full       Extraocular Movement      Right Left    Full Full       Neuro/Psych    Oriented x3: Yes   Mood/Affect: Normal       Dilation    Both eyes: 1.0% Mydriacyl, 2.5% Phenylephrine @ 1:54 PM        Slit Lamp and Fundus Exam    Slit Lamp Exam      Right Left   Lids/Lashes  Dermatochalasis - upper lid, Telangiectasia, mild MGD Dermatochalasis - upper lid, Telangiectasia, Meibomian gland dysfunction   Conjunctiva/Sclera White and quiet White and quiet   Cornea Temporal Well healed cataract wounds, mild Arcus, trace endo pigment nasally, 1+PEE Arcus, Temporal Well healed cataract wounds,  1-2+ PEE   Anterior Chamber Deep and quiet Deep and quiet   Iris Round and moderately dilated to 4.30mm Round and moderately dilated to 48mm, Iris atrophy from 0100 to 0230, peripheral irodotimy at 0200   Lens 3-piece PC IOL in good position PC IOL in good position   Vitreous Vitreous syneresis, Posterior vitreous detachment, Weiss ring Vitreous syneresis, PVD, vitreous condensations       Fundus Exam      Right Left   Disc 360 Peripapillary atrophy, diffuse mild pallor, compact, sharp rim mild pallor, Peripapillary atrophy and pigmentation   C/D Ratio 0.2 0.3   Macula Blunted foveal reflex, +PED, RPE mottling and clumping, Drusen, No heme or edema Blunted foveal reflex, PED / +CNVM with stable resolution of low lying SRF, RPE mottling and clumping, Drusen, No heme or edema, persistent CWS inferior macula   Vessels Vascular attenuation Vascular attenuation   Periphery Attached, 360 Reticular degeneration Attached, 360 Reticular degeneration        Refraction    Wearing Rx      Sphere Cylinder Axis   Right -1.50 +0.75 180   Left -0.50 +0.75 180          IMAGING AND PROCEDURES  Imaging and Procedures for @TODAY @  OCT, Retina - OU - Both Eyes       Right Eye Quality was good. Central Foveal Thickness: 237. Progression has been stable. Findings include normal foveal contour, no IRF, no SRF, pigment epithelial detachment, outer retinal atrophy, retinal drusen  (persistent PED).   Left Eye Quality was good. Central Foveal Thickness: 244. Progression has been stable. Findings include no IRF, retinal drusen , pigment epithelial detachment, outer retinal atrophy, epiretinal  membrane, abnormal foveal contour, no SRF (stable improvement in IRF overlying low lying PED).   Notes *Images captured and stored on drive  Diagnosis / Impression:  OD: Non-Exudative ARMD -- stable OS: Exudative ARMD --stable improvement in IRF overlying low lying PED   Clinical management:  See below  Abbreviations: NFP - Normal foveal profile. CME - cystoid macular edema. PED - pigment epithelial detachment. IRF - intraretinal fluid. SRF - subretinal fluid. EZ - ellipsoid zone. ERM - epiretinal membrane. ORA - outer retinal atrophy. ORT - outer retinal tubulation. SRHM - subretinal hyper-reflective material         Intravitreal Injection, Pharmacologic Agent - OS - Left Eye       Time Out 07/04/2019. 3:01 PM. Confirmed correct patient, procedure, site, and patient consented.   Anesthesia Topical anesthesia was used. Anesthetic medications included Lidocaine 2%, Proparacaine 0.5%.   Procedure Preparation included 5% betadine to ocular surface, eyelid speculum. A supplied needle was used.   Injection:  1.25 mg Bevacizumab (AVASTIN) SOLN   NDC: 16109-604-54, Lot: [id number], Expiration date: 08/29/2019   Route: Intravitreal, Site: Left Eye, Waste: 0 mL  Post-op Post injection exam found visual acuity of at least counting fingers. The patient tolerated the procedure well. There were no complications. The patient received written and verbal post procedure care education.                 ASSESSMENT/PLAN:    ICD-10-CM   1. Exudative age-related macular degeneration of left eye with active choroidal neovascularization (HCC)  H35.3221 Intravitreal Injection, Pharmacologic Agent - OS - Left Eye    Bevacizumab (AVASTIN) SOLN 1.25 mg  2. Retinal edema  H35.81 OCT, Retina - OU - Both Eyes  3. 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 Diabetes mellitus type 2 without retinopathy ([place])  E11.9   5. Essential hypertension  I10    6. Hypertensive retinopathy of both eyes  H35.033   7. Pseudophakia of both eyes  Z96.1    1,2. Exudative age-related macular degeneration, left eye  - conversion of OS from nonexudative to exudative ARMD prior to 02.05.20 visit  - s/p IVA OS #1 (02.05.20), #2 (03.04.20), #3 (04.30.20), #4 (06.02.20), #5 (07.14.20), #6 (9.1.20), #7 (10.27.20), #8 (12.23.20), #9 (02.03.21), #10 (03.17.21), #11 (04.28.21)  - **history of recurrent SRF with extension to 8 wks, noted on 12.23.20**  - OCT shows stable resolution of SRF overlying PED at 6 wk interval  - BCVA stable at 20/40 OS  - recommend IVA #12 OS today 06.09.21 -- maintenance w/ extension to 7 wks   - pt wishes to be treated with IVA OS  - risks and benefits of intravitreal avastin discussed with patient  - informed consent obtained  - see procedure note  - Avastin informed consent obtained and scanned today, 02.03.21  - f/u in 7 wks for DFE, OCT, likely injection OS   3. Age related macular degeneration, non-exudative, right eye  - stable, former pt of Dr. Zigmund Daniel -- no significant change in OCT or exam  - The incidence, anatomy, and pathology of dry AMD, risk of progression, and the AREDS and AREDS 2 study including smoking risks discussed with patient.  - recommend amsler grid monitoring  4. Diabetes mellitus, type 2 without retinopathy  - The incidence, risk factors for progression, natural history and treatment options for diabetic retinopathy  were discussed with patient.    - The need for close monitoring of blood glucose, blood pressure, and serum lipids, avoiding cigarette or any type of tobacco, and the need for long term follow up was also discussed with patient.  - f/u in 1 year  5,6. Hypertensive retinopathy OU  - discussed importance of tight BP control  - monitor  7. Pseudophakia OU  - s/p CE/IOL OU  - beautiful surgeries, doing well  - monitor  Ophthalmic Meds Ordered this visit:  Meds ordered this encounter   Medications  . Bevacizumab (AVASTIN) SOLN 1.25 mg   Return in about 7 weeks (around 08/22/2019) for f/u exu ARMD OS, DFE, OCT.  Patient Instructions  49    This document serves as a record of services personally performed by Gardiner Sleeper, MD, PhD. It was created on their behalf by Ernest Mallick, OA, an ophthalmic assistant. The creation of this record is the provider's dictation and/or activities during the visit.    Electronically signed by: Ernest Mallick, OA 06.09.2021 11:41 PM  Gardiner Sleeper, M.D., Ph.D. Diseases & Surgery of the Retina and [place] 07/04/2019   I have reviewed the above documentation for accuracy and completeness, and I agree with the above. Gardiner Sleeper, M.D., Ph.D. 07/05/19 11:41 PM   Abbreviations: M myopia (nearsighted); A astigmatism; H hyperopia (farsighted); P presbyopia; Mrx spectacle prescription;  CTL contact lenses; OD right eye; OS left eye; OU both eyes  XT exotropia; ET esotropia; PEK punctate epithelial keratitis; PEE punctate epithelial erosions; DES dry eye syndrome; MGD meibomian gland dysfunction; ATs artificial tears; PFAT's preservative free artificial tears; [place] nuclear sclerotic cataract; PSC posterior subcapsular cataract; ERM epi-retinal membrane; PVD posterior vitreous detachment; RD retinal detachment; DM diabetes mellitus; DR diabetic retinopathy; NPDR non-proliferative diabetic retinopathy; PDR proliferative diabetic retinopathy; CSME clinically significant macular  edema; DME diabetic macular edema; dbh dot blot hemorrhages; CWS cotton wool spot; POAG primary open angle glaucoma; C/D cup-to-disc ratio; HVF humphrey visual field; GVF goldmann visual field; OCT optical coherence tomography; IOP intraocular pressure; BRVO Branch retinal vein occlusion; CRVO central retinal vein occlusion; CRAO central retinal artery occlusion; BRAO branch retinal artery occlusion; RT retinal tear; SB scleral buckle; PPV  pars plana vitrectomy; VH Vitreous hemorrhage; PRP panretinal laser photocoagulation; IVK intravitreal kenalog; VMT vitreomacular traction; MH Macular hole;  NVD neovascularization of the disc; NVE neovascularization elsewhere; AREDS age related eye disease study; ARMD age related macular degeneration; POAG primary open angle glaucoma; EBMD epithelial/anterior basement membrane dystrophy; ACIOL anterior chamber intraocular lens; IOL intraocular lens; PCIOL posterior chamber intraocular lens; Phaco/IOL phacoemulsification with intraocular lens placement; [place] photorefractive keratectomy; LASIK laser assisted in situ keratomileusis; HTN hypertension; DM diabetes mellitus; COPD chronic obstructive pulmonary disease

## 2019-07-04 ENCOUNTER — Ambulatory Visit (INDEPENDENT_AMBULATORY_CARE_PROVIDER_SITE_OTHER): Payer: Medicare Other | Admitting: Ophthalmology

## 2019-07-04 DIAGNOSIS — Z961 Presence of intraocular lens: Secondary | ICD-10-CM

## 2019-07-04 DIAGNOSIS — H353221 Exudative age-related macular degeneration, left eye, with active choroidal neovascularization: Secondary | ICD-10-CM | POA: Diagnosis not present

## 2019-07-04 DIAGNOSIS — H3581 Retinal edema: Secondary | ICD-10-CM

## 2019-07-04 DIAGNOSIS — E119 Type 2 diabetes mellitus without complications: Secondary | ICD-10-CM | POA: Diagnosis not present

## 2019-07-04 DIAGNOSIS — H353112 Nonexudative age-related macular degeneration, right eye, intermediate dry stage: Secondary | ICD-10-CM

## 2019-07-04 DIAGNOSIS — I10 Essential (primary) hypertension: Secondary | ICD-10-CM | POA: Diagnosis not present

## 2019-07-04 DIAGNOSIS — H35033 Hypertensive retinopathy, bilateral: Secondary | ICD-10-CM

## 2019-07-04 NOTE — Patient Instructions (Signed)
77

## 2019-07-05 ENCOUNTER — Ambulatory Visit: Payer: Medicare Other | Admitting: Physical Therapy

## 2019-07-05 ENCOUNTER — Other Ambulatory Visit: Payer: Self-pay

## 2019-07-05 ENCOUNTER — Encounter (INDEPENDENT_AMBULATORY_CARE_PROVIDER_SITE_OTHER): Payer: Self-pay | Admitting: Ophthalmology

## 2019-07-05 DIAGNOSIS — M25611 Stiffness of right shoulder, not elsewhere classified: Secondary | ICD-10-CM

## 2019-07-05 DIAGNOSIS — M25511 Pain in right shoulder: Secondary | ICD-10-CM | POA: Diagnosis not present

## 2019-07-05 DIAGNOSIS — M6281 Muscle weakness (generalized): Secondary | ICD-10-CM | POA: Diagnosis not present

## 2019-07-05 MED ORDER — BEVACIZUMAB CHEMO INJECTION 1.25MG/0.05ML SYRINGE FOR KALEIDOSCOPE
1.2500 mg | INTRAVITREAL | Status: AC | PRN
  Administered 2019-07-05: 1.25 mg via INTRAVITREAL

## 2019-07-05 NOTE — Therapy (Signed)
[facility] Physical Therapy [address] [place], [place], [address] Phone: [phone]   Fax:  [phone]  Physical Therapy Treatment  Patient Details  Name: Sue [name] MRN: [id number] Date of Birth: 1932-05-18 Referring Provider (PT): Eunice Blase, MD   Encounter Date: 07/05/2019   PT End of Session - 07/05/19 1527    Visit Number 4    Number of Visits 13    Date for PT Re-Evaluation 08/03/19    Authorization Type UHC and Medicare, [place] after 15 visits, PN at 10th visit    Progress Note Due on Visit 10    PT Start Time 1300    PT Stop Time 1343    PT Time Calculation (min) 43 min    Activity Tolerance Patient tolerated treatment well    Behavior During Therapy [facility] for tasks assessed/performed           Past Medical History:  Diagnosis Date   Basal cell carcinoma 11/23/2017   left elbow, right neck TX CX3 5FU    Basal cell carcinoma 04/04/2019   infil-right sideburn (CX35FU)   Diabetes mellitus without complication (HCC)    Hypertension    Hypertensive retinopathy    OU   Macular degeneration    OU   SCC (squamous cell carcinoma) 11/23/2017   right forehead TX CX3 5FU    Past Surgical History:  Procedure Laterality Date   ABDOMINAL HYSTERECTOMY     CATARACT EXTRACTION     CHOLECYSTECTOMY     EYE SURGERY     SPINE SURGERY      There were no vitals filed for this visit.   Subjective Assessment - 07/05/19 1528    Subjective says her shoulder is improving some. Does not provide pain rating when asked    Pertinent History DM, HTN, macular degeneration, spine surgery almost 20 years ago, eye surgery, gall bladder, hyserectomy, hernia repair, callus on R foot midfoot that was recently shaved    Patient Stated Goals "Stop hurting"              [facility] PT Assessment - 07/05/19 0001      Assessment   Medical Diagnosis R shoulder pain  M25.511    Referring Provider (PT) Hilts, Michael, MD      AROM   Right  Shoulder Flexion 140 Degrees    Right Shoulder ABduction 125 Degrees      Strength   Right Shoulder Flexion 3+/5    Right Shoulder Extension 3+/5    Right Shoulder ABduction 3+/5    Right Shoulder Internal Rotation 4+/5    Right Shoulder External Rotation 3+/5                         [facility] Adult PT Treatment/Exercise - 07/05/19 0001      Shoulder Exercises: Seated   Other Seated Exercises chest press with yellow band 2X10    Other Seated Exercises cane press OH 2X10, cane abd, flexion, IR/ER all 2X10 bilat      Shoulder Exercises: Standing   External Rotation Right    Theraband Level (Shoulder External Rotation) Level 1 (Yellow)    External Rotation Limitations 2X10, heavy cues on technique    Internal Rotation Right    Theraband Level (Shoulder Internal Rotation) Level 2 (Red)    Internal Rotation Limitations 2x10, heavy cues on technique    Extension Both    Theraband Level (Shoulder Extension) Level 2 (Red)    Extension Limitations  2X10    Row Both    Theraband Level (Shoulder Row) Level 2 (Red)    Row Weight (lbs) 2X10      Shoulder Exercises: Pulleys   Flexion 2 minutes    ABduction 2 minutes      Shoulder Exercises: ROM/Strengthening   UBE (Upper Arm Bike) 2.5 min fwd, 2.5 min retro    Other ROM/Strengthening Exercises Nu step UE/LE ROM and endurance 6 min L5                       PT Long Term Goals - 06/20/19 1255      PT LONG TERM GOAL #1   Title Pt will be independent in her HEP and progression.    Time 6    Period Weeks    Status New    Target Date 08/03/19      PT LONG TERM GOAL #2   Title Pt will improve her R shoulder flexion to >/= 150 degrees with no pain reported.    Baseline pain with flexion, 130 degrees    Time 6    Period Weeks    Status New      PT LONG TERM GOAL #3   Title Pt will be able to lift 5# from counter height to above shoulder height with no pain reported.    Baseline unable to currently    Time 6     Period Weeks    Status New      PT LONG TERM GOAL #4   Title pt will be albe to improve R 5 time  sit to stand to </= 25 seconds using UE support as needed.    Baseline see flow sheet    Time 6    Period Weeks    Status New                 Plan - 07/05/19 1528    Clinical Impression Statement She has made improvements in Rt shoulder measurements for strength and ROM, see above. She still has deficits and will continue to benefit from PT    Personal Factors and Comorbidities Age;Comorbidity 3+    Comorbidities DM, basel cell carcinoma, HTN, macula degeneration, spine surgery, eye surgery    Examination-Activity Limitations Lift;Carry;Reach Overhead    Examination-Participation Restrictions Community Activity;Other    Stability/Clinical Decision Making Stable/Uncomplicated    Rehab Potential Good    PT Frequency 2x / week    PT Duration 6 weeks    PT Treatment/Interventions Cryotherapy;ADLs/Self Care Home Management;Electrical Stimulation;Iontophoresis 4mg /ml Dexamethasone;Moist Heat;Functional mobility training;Stair training;Gait training;Therapeutic activities;Therapeutic exercise;Balance training;Neuromuscular re-education;Patient/family education;Manual techniques;Passive range of motion;Dry needling;Taping    PT Next Visit Plan Balance as appropriate, specifically when boot is removed. Rt gh mobility/strengthening.    PT Home Exercise Plan [email or web address] : Access Code    Consulted and Agree with Plan of Care Patient;Family member/caregiver    Family Member Consulted daughter           Patient will benefit from skilled therapeutic intervention in order to improve the following deficits and impairments:  Pain, Postural dysfunction, Decreased strength, Decreased range of motion, Improper body mechanics, Decreased balance, Difficulty walking  Visit Diagnosis: Acute pain of right shoulder  Stiffness of right shoulder, not elsewhere classified  Muscle weakness  (generalized)     Problem List Patient Active Problem List   Diagnosis Date Noted   Gastroenteritis due to COVID-19 virus 12/21/2018   Acute renal failure superimposed on stage 3b  chronic kidney disease ([place]) 12/21/2018   Diabetes mellitus type 2, uncontrolled, with complications ([place]) 97/84/7841   HLD (hyperlipidemia) 12/20/2018   Anxiety 12/20/2018   Depression 12/20/2018   Acute respiratory disease due to COVID-19 virus 12/17/2018   Dehydration 12/17/2018   Essential hypertension 12/17/2018   Type 1 diabetes mellitus without complication ([place]) 28/20/8138   Diabetic ulcer of foot associated with diabetes mellitus due to underlying condition, limited to breakdown of skin ([place]) 01/11/2018   Right hip pain 04/15/2016   Right buttock pain 04/15/2016   Primary osteoarthritis of right hip 04/15/2016    Debbe Odea , PT,DPT 07/05/2019, 3:29 PM  [facility] Therapy [address] [place], [place], [address] Phone: [phone]   Fax:  [phone]  Name: Sue [name] MRN: [id number] Date of Birth: September 24, 1932

## 2019-07-09 ENCOUNTER — Encounter: Payer: Medicare Other | Admitting: Physical Therapy

## 2019-07-11 ENCOUNTER — Ambulatory Visit: Payer: Medicare Other | Admitting: Physical Therapy

## 2019-07-11 ENCOUNTER — Other Ambulatory Visit: Payer: Medicare Other | Admitting: Orthotics

## 2019-07-11 ENCOUNTER — Other Ambulatory Visit: Payer: Self-pay

## 2019-07-11 ENCOUNTER — Ambulatory Visit (INDEPENDENT_AMBULATORY_CARE_PROVIDER_SITE_OTHER): Payer: Medicare Other | Admitting: Podiatry

## 2019-07-11 VITALS — Temp 98.4°F

## 2019-07-11 DIAGNOSIS — L97511 Non-pressure chronic ulcer of other part of right foot limited to breakdown of skin: Secondary | ICD-10-CM | POA: Diagnosis not present

## 2019-07-11 DIAGNOSIS — E08621 Diabetes mellitus due to underlying condition with foot ulcer: Secondary | ICD-10-CM | POA: Diagnosis not present

## 2019-07-11 DIAGNOSIS — M25611 Stiffness of right shoulder, not elsewhere classified: Secondary | ICD-10-CM

## 2019-07-11 DIAGNOSIS — M6281 Muscle weakness (generalized): Secondary | ICD-10-CM

## 2019-07-11 DIAGNOSIS — M25511 Pain in right shoulder: Secondary | ICD-10-CM | POA: Diagnosis not present

## 2019-07-11 DIAGNOSIS — E11621 Type 2 diabetes mellitus with foot ulcer: Secondary | ICD-10-CM

## 2019-07-11 NOTE — Therapy (Signed)
[facility] Therapy [address] [place], [place], [address] Phone: [phone]   Fax:  [phone]  Physical Therapy Treatment  Patient Details  Name: Sue [name] MRN: [id number] Date of Birth: 1932/04/24 Referring Provider (PT): Eunice Blase, MD   Encounter Date: 07/11/2019   PT End of Session - 07/11/19 1410    Visit Number 5    Number of Visits 13    Date for PT Re-Evaluation 08/03/19    Authorization Type UHC and Medicare, [place] after 15 visits, PN at 10th visit    Progress Note Due on Visit 10    PT Start Time 1305    PT Stop Time 1347    PT Time Calculation (min) 42 min    Activity Tolerance Patient tolerated treatment well    Behavior During Therapy [facility] for tasks assessed/performed           Past Medical History:  Diagnosis Date  . Basal cell carcinoma 11/23/2017   left elbow, right neck TX CX3 5FU   . Basal cell carcinoma 04/04/2019   infil-right sideburn (CX35FU)  . Diabetes mellitus without complication ([place])   . Hypertension   . Hypertensive retinopathy    OU  . Macular degeneration    OU  . SCC (squamous cell carcinoma) 11/23/2017   right forehead TX CX3 5FU    Past Surgical History:  Procedure Laterality Date  . ABDOMINAL HYSTERECTOMY    . CATARACT EXTRACTION    . CHOLECYSTECTOMY    . EYE SURGERY    . SPINE SURGERY      There were no vitals filed for this visit.   Subjective Assessment - 07/11/19 1317    Subjective she says her shoulder is doing good, says "not that much pain". She had podietrist appointment today and reports she can now be out to the walking boot    Pertinent History DM, HTN, macular degeneration, spine surgery almost 20 years ago, eye surgery, gall bladder, hyserectomy, hernia repair, callus on R foot midfoot that was recently shaved    Patient Stated Goals "Stop hurting"              [facility] PT Assessment - 07/11/19 0001      Assessment   Medical Diagnosis R shoulder pain   M25.511    Referring Provider (PT) Hilts, Michael, MD      AROM   Right Shoulder Flexion 155 Degrees    Right Shoulder ABduction 150 Degrees      Standardized Balance Assessment   Standardized Balance Assessment Five Times Sit to Stand    Five times sit to stand comments  20.6 sec using UE to push off                         [facility] Adult PT Treatment/Exercise - 07/11/19 0001      Neuro Re-ed    Neuro Re-ed Details  sidestepping without UE support at counter top.      Exercises   Exercises Other Exercises      Shoulder Exercises: Seated   Other Seated Exercises chest press with yellow band 2X10    Other Seated Exercises cane press OH 2X10, cane and cane IR/ER all 2X10 bilat      Shoulder Exercises: Standing   External Rotation Limitations isometrics 5 sec hold X 15 reps    Extension Both    Theraband Level (Shoulder Extension) Level 2 (Red)    Extension Limitations 2X10  Row Both    Theraband Level (Shoulder Row) Level 2 (Red)    Row Weight (lbs) 2X10      Shoulder Exercises: Pulleys   Flexion 2 minutes    ABduction 2 minutes      Shoulder Exercises: ROM/Strengthening   Nustep L6 X 7 min UE/LE                       PT Long Term Goals - 07/11/19 1326      PT LONG TERM GOAL #1   Title Pt will be independent in her HEP and progression.    Baseline updated today    Time 6    Period Weeks    Status On-going      PT LONG TERM GOAL #2   Title Pt will improve her R shoulder flexion to >/= 150 degrees with no pain reported.    Baseline no pain and 155 deg    Time 6    Period Weeks    Status Achieved      PT LONG TERM GOAL #3   Title Pt will be able to lift 5# from counter height to above shoulder height with no pain reported.    Baseline unable to currently    Time 6    Period Weeks    Status On-going      PT LONG TERM GOAL #4   Title pt will be albe to improve R 5 time  sit to stand to </= 25 seconds using UE support as needed.     Baseline 20 sec on 6/16    Time 6    Period Weeks    Status Achieved                 Plan - 07/11/19 1411    Clinical Impression Statement She has now met 2/4 of her PT long term goals. HEP was progressed to reflect her progress and challenge overall strenght and balance more. Still with defiicits in UE/LE strength and balance. Continue POC    Personal Factors and Comorbidities Age;Comorbidity 3+    Comorbidities DM, basel cell carcinoma, HTN, macula degeneration, spine surgery, eye surgery    Examination-Activity Limitations Lift;Carry;Reach Overhead    Examination-Participation Restrictions Community Activity;Other    Stability/Clinical Decision Making Stable/Uncomplicated    Rehab Potential Good    PT Frequency 2x / week    PT Duration 6 weeks    PT Treatment/Interventions Cryotherapy;ADLs/Self Care Home Management;Electrical Stimulation;Iontophoresis 75m/ml Dexamethasone;Moist Heat;Functional mobility training;Stair training;Gait training;Therapeutic activities;Therapeutic exercise;Balance training;Neuromuscular re-education;Patient/family education;Manual techniques;Passive range of motion;Dry needling;Taping    PT Next Visit Plan Balance as appropriate, specifically when boot is removed. Rt gh mobility/strengthening.    PT Home Exercise Plan [email or web address]: Access Code    Consulted and Agree with Plan of Care Patient;Family member/caregiver    Family Member Consulted daughter           Patient will benefit from skilled therapeutic intervention in order to improve the following deficits and impairments:  Pain, Postural dysfunction, Decreased strength, Decreased range of motion, Improper body mechanics, Decreased balance, Difficulty walking  Visit Diagnosis: Acute pain of right shoulder  Stiffness of right shoulder, not elsewhere classified  Muscle weakness (generalized)     Problem List Patient Active Problem List   Diagnosis Date Noted  . Gastroenteritis due to  COVID-19 virus 12/21/2018  . Acute renal failure superimposed on stage 3b chronic kidney disease ([place] 12/21/2018  . Diabetes mellitus type 2, uncontrolled,  with complications ([place]) 83/33/8329  . HLD (hyperlipidemia) 12/20/2018  . Anxiety 12/20/2018  . Depression 12/20/2018  . Acute respiratory disease due to COVID-19 virus 12/17/2018  . Dehydration 12/17/2018  . Essential hypertension 12/17/2018  . Type 1 diabetes mellitus without complication ([place]) 19/16/6060  . Diabetic ulcer of foot associated with diabetes mellitus due to underlying condition, limited to breakdown of skin ([place]) 01/11/2018  . Right hip pain 04/15/2016  . Right buttock pain 04/15/2016  . Primary osteoarthritis of right hip 04/15/2016    Silvestre Mesi 07/11/2019, 2:13 PM  [facility] Physical Therapy [address] [place], [place], [address] Phone: [phone]   Fax:  [phone]  Name: Sue [name] MRN: [id number] Date of Birth: Oct 07, 1932

## 2019-07-11 NOTE — Patient Instructions (Signed)
Diabetes Mellitus and Foot Care Foot care is an important part of your health, especially when you have diabetes. Diabetes may cause you to have problems because of poor blood flow (circulation) to your feet and legs, which can cause your skin to:  Become thinner and drier.  Break more easily.  Heal more slowly.  Peel and crack. You may also have nerve damage (neuropathy) in your legs and feet, causing decreased feeling in them. This means that you may not notice minor injuries to your feet that could lead to more serious problems. Noticing and addressing any potential problems early is the best way to prevent future foot problems. How to care for your feet Foot hygiene  Wash your feet daily with warm water and mild soap. Do not use hot water. Then, pat your feet and the areas between your toes until they are completely dry. Do not soak your feet as this can dry your skin.  Trim your toenails straight across. Do not dig under them or around the cuticle. File the edges of your nails with an emery board or nail file.  Apply a moisturizing lotion or petroleum jelly to the skin on your feet and to dry, brittle toenails. Use lotion that does not contain alcohol and is unscented. Do not apply lotion between your toes. Shoes and socks  Wear clean socks or stockings every day. Make sure they are not too tight. Do not wear knee-high stockings since they may decrease blood flow to your legs.  Wear shoes that fit properly and have enough cushioning. Always look in your shoes before you put them on to be sure there are no objects inside.  To break in new shoes, wear them for just a few hours a day. This prevents injuries on your feet. Wounds, scrapes, corns, and calluses  Check your feet daily for blisters, cuts, bruises, sores, and redness. If you cannot see the bottom of your feet, use a mirror or ask someone for help.  Do not cut corns or calluses or try to remove them with medicine.  If you  find a minor scrape, cut, or break in the skin on your feet, keep it and the skin around it clean and dry. You may clean these areas with mild soap and water. Do not clean the area with peroxide, alcohol, or iodine.  If you have a wound, scrape, corn, or callus on your foot, look at it several times a day to make sure it is healing and not infected. Check for: ? Redness, swelling, or pain. ? Fluid or blood. ? Warmth. ? Pus or a bad smell. General instructions  Do not cross your legs. This may decrease blood flow to your feet.  Do not use heating pads or hot water bottles on your feet. They may burn your skin. If you have lost feeling in your feet or legs, you may not know this is happening until it is too late.  Protect your feet from hot and cold by wearing shoes, such as at the beach or on hot pavement.  Schedule a complete foot exam at least once a year (annually) or more often if you have foot problems. If you have foot problems, report any cuts, sores, or bruises to your health care provider immediately. Contact a health care provider if:  You have a medical condition that increases your risk of infection and you have any cuts, sores, or bruises on your feet.  You have an injury that is not   healing.  You have redness on your legs or feet.  You feel burning or tingling in your legs or feet.  You have pain or cramps in your legs and feet.  Your legs or feet are numb.  Your feet always feel cold.  You have pain around a toenail. Get help right away if:  You have a wound, scrape, corn, or callus on your foot and: ? You have pain, swelling, or redness that gets worse. ? You have fluid or blood coming from the wound, scrape, corn, or callus. ? Your wound, scrape, corn, or callus feels warm to the touch. ? You have pus or a bad smell coming from the wound, scrape, corn, or callus. ? You have a fever. ? You have a red line going up your leg. Summary  Check your feet every day  for cuts, sores, red spots, swelling, and blisters.  Moisturize feet and legs daily.  Wear shoes that fit properly and have enough cushioning.  If you have foot problems, report any cuts, sores, or bruises to your health care provider immediately.  Schedule a complete foot exam at least once a year (annually) or more often if you have foot problems. This information is not intended to replace advice given to you by your health care provider. Make sure you discuss any questions you have with your health care provider. Document Revised: 10/04/2018 Document Reviewed: 02/13/2016 Elsevier Patient Education  2020 [employer]  

## 2019-07-16 ENCOUNTER — Encounter: Payer: Self-pay | Admitting: Physical Therapy

## 2019-07-16 ENCOUNTER — Other Ambulatory Visit: Payer: Self-pay

## 2019-07-16 ENCOUNTER — Ambulatory Visit: Payer: Medicare Other | Admitting: Physical Therapy

## 2019-07-16 DIAGNOSIS — M25611 Stiffness of right shoulder, not elsewhere classified: Secondary | ICD-10-CM | POA: Diagnosis not present

## 2019-07-16 DIAGNOSIS — M6281 Muscle weakness (generalized): Secondary | ICD-10-CM | POA: Diagnosis not present

## 2019-07-16 DIAGNOSIS — M25511 Pain in right shoulder: Secondary | ICD-10-CM

## 2019-07-16 NOTE — Therapy (Signed)
[facility] [address] [place], [place], [address] Phone: [phone]   Fax:  [phone]  Physical Therapy Treatment  Patient Details  Name: Sue [name] MRN: [id number] Date of Birth: 10-Nov-1932 Referring Provider (PT): Eunice Blase, MD   Encounter Date: 07/16/2019   PT End of Session - 07/16/19 1145    Visit Number 6    Number of Visits 13    Date for PT Re-Evaluation 08/03/19    Authorization Type UHC and Medicare, [place] after 15 visits, PN at 10th visit    Progress Note Due on Visit 10    PT Start Time 1100    PT Stop Time 1141    PT Time Calculation (min) 41 min    Activity Tolerance Patient tolerated treatment well    Behavior During Therapy [facility] for tasks assessed/performed           Past Medical History:  Diagnosis Date  . Basal cell carcinoma 11/23/2017   left elbow, right neck TX CX3 5FU   . Basal cell carcinoma 04/04/2019   infil-right sideburn (CX35FU)  . Diabetes mellitus without complication ([place])   . Hypertension   . Hypertensive retinopathy    OU  . Macular degeneration    OU  . SCC (squamous cell carcinoma) 11/23/2017   right forehead TX CX3 5FU    Past Surgical History:  Procedure Laterality Date  . ABDOMINAL HYSTERECTOMY    . CATARACT EXTRACTION    . CHOLECYSTECTOMY    . EYE SURGERY    . SPINE SURGERY      There were no vitals filed for this visit.   Subjective Assessment - 07/16/19 1129    Subjective relays no shoulder pain upon arrival    Pertinent History DM, HTN, macular degeneration, spine surgery almost 20 years ago, eye surgery, gall bladder, hyserectomy, hernia repair, callus on R foot midfoot that was recently shaved    Patient Stated Goals "Stop hurting"             [facility] Adult PT Treatment/Exercise - 07/16/19 0001      Neuro Re-ed    Neuro Re-ed Details  Balance: No Ue support at counter: standing feet together, progressed to feet apart eyes closed, progressed to feet  together on foam, progressed to heel taps on foam      Shoulder Exercises: Seated   Other Seated Exercises chest press with yellow band 2X10    Other Seated Exercises cane press OH 2X10, cane and cane IR/ER all 2X10 bilat      Shoulder Exercises: Standing   External Rotation Limitations isometrics 5 sec hold X 15 reps    Internal Rotation Limitations isometrics 5 sec hold X 15 reps    Extension Both;20 reps    Theraband Level (Shoulder Extension) Level 2 (Red)    Row Both;20 reps    Theraband Level (Shoulder Row) Level 2 (Red)      Shoulder Exercises: Pulleys   Flexion 2 minutes    ABduction 2 minutes      Shoulder Exercises: ROM/Strengthening   Nustep L6 X 7 min UE/LE                       PT Long Term Goals - 07/11/19 1326      PT LONG TERM GOAL #1   Title Pt will be independent in her HEP and progression.    Baseline updated today    Time 6    Period Weeks  Status On-going      PT LONG TERM GOAL #2   Title Pt will improve her R shoulder flexion to >/= 150 degrees with no pain reported.    Baseline no pain and 155 deg    Time 6    Period Weeks    Status Achieved      PT LONG TERM GOAL #3   Title Pt will be able to lift 5# from counter height to above shoulder height with no pain reported.    Baseline unable to currently    Time 6    Period Weeks    Status On-going      PT LONG TERM GOAL #4   Title pt will be albe to improve R 5 time  sit to stand to </= 25 seconds using UE support as needed.    Baseline 20 sec on 6/16    Time 6    Period Weeks    Status Achieved                 Plan - 07/16/19 1146    Clinical Impression Statement She is having much less pain overall and is improving with strength and shoulder ROM however still with deficits in Rt shoulder strength, endurance, and balance and will continue to benefit from PT.    Personal Factors and Comorbidities Age;Comorbidity 3+    Comorbidities DM, basel cell carcinoma, HTN, macula  degeneration, spine surgery, eye surgery    Examination-Activity Limitations Lift;Carry;Reach Overhead    Examination-Participation Restrictions Community Activity;Other    Stability/Clinical Decision Making Stable/Uncomplicated    Rehab Potential Good    PT Frequency 2x / week    PT Duration 6 weeks    PT Treatment/Interventions Cryotherapy;ADLs/Self Care Home Management;Electrical Stimulation;Iontophoresis 4mg /ml Dexamethasone;Moist Heat;Functional mobility training;Stair training;Gait training;Therapeutic activities;Therapeutic exercise;Balance training;Neuromuscular re-education;Patient/family education;Manual techniques;Passive range of motion;Dry needling;Taping    PT Next Visit Plan Balance as appropriate, specifically when boot is removed. Rt gh mobility/strengthening.    PT Home Exercise Plan [email or web address] : Access Code    Consulted and Agree with Plan of Care Patient;Family member/caregiver    Family Member Consulted daughter           Patient will benefit from skilled therapeutic intervention in order to improve the following deficits and impairments:  Pain, Postural dysfunction, Decreased strength, Decreased range of motion, Improper body mechanics, Decreased balance, Difficulty walking  Visit Diagnosis: Acute pain of right shoulder  Stiffness of right shoulder, not elsewhere classified  Muscle weakness (generalized)     Problem List Patient Active Problem List   Diagnosis Date Noted  . Gastroenteritis due to COVID-19 virus 12/21/2018  . Acute renal failure superimposed on stage 3b chronic kidney disease ([place]) 12/21/2018  . Diabetes mellitus type 2, uncontrolled, with complications ([place]) 69/67/8938  . HLD (hyperlipidemia) 12/20/2018  . Anxiety 12/20/2018  . Depression 12/20/2018  . Acute respiratory disease due to COVID-19 virus 12/17/2018  . Dehydration 12/17/2018  . Essential hypertension 12/17/2018  . Type 1 diabetes mellitus without complication ([place]) 11/10/5100   . Diabetic ulcer of foot associated with diabetes mellitus due to underlying condition, limited to breakdown of skin ([place]) 01/11/2018  . Right hip pain 04/15/2016  . Right buttock pain 04/15/2016  . Primary osteoarthritis of right hip 04/15/2016    Debbe Odea, PT,DPT 07/16/2019, 11:47 AM  [facility] [address] [place], [place], [address] Phone: [phone]   Fax:  [phone]  Name: KERENSA NICKLAS MRN: [id number] Date of Birth:  07/21/1932   

## 2019-07-18 ENCOUNTER — Other Ambulatory Visit: Payer: Self-pay

## 2019-07-18 ENCOUNTER — Ambulatory Visit: Payer: Medicare Other | Admitting: Physical Therapy

## 2019-07-18 DIAGNOSIS — M25511 Pain in right shoulder: Secondary | ICD-10-CM | POA: Diagnosis not present

## 2019-07-18 DIAGNOSIS — M25611 Stiffness of right shoulder, not elsewhere classified: Secondary | ICD-10-CM

## 2019-07-18 DIAGNOSIS — M6281 Muscle weakness (generalized): Secondary | ICD-10-CM

## 2019-07-18 NOTE — Progress Notes (Signed)
Subjective:  Patient ID: Sue [name], female    DOB: 12-Jan-1933,  MRN: [id number]  86 y.o. female is seen for chief concern of breakdown of previously healed ulceration plantar right foot.  Her daughter is present during today's visit. Daughter hasn't had a chance to get diabetic shoe certification form to PCP.   Ulceration location:  submet head 2 right foot  Offloading achieved with diabetic shoes in the past. She is waiting on a new pair of diabetic shoes.  Patient relates wound is better than last visit.  Hx confirmed with Sue [name] and her daughter.  Patient denies fever, chills, night sweats, nausea, or vomiting.  Past Medical History:  Diagnosis Date  . Basal cell carcinoma 11/23/2017   left elbow, right neck TX CX3 5FU   . Basal cell carcinoma 04/04/2019   infil-right sideburn (CX35FU)  . Diabetes mellitus without complication ([place])   . Hypertension   . Hypertensive retinopathy    OU  . Macular degeneration    OU  . SCC (squamous cell carcinoma) 11/23/2017   right forehead TX CX3 5FU     Past Surgical History:  Procedure Laterality Date  . ABDOMINAL HYSTERECTOMY    . CATARACT EXTRACTION    . CHOLECYSTECTOMY    . EYE SURGERY    . SPINE SURGERY       Current Outpatient Medications on File Prior to Visit  Medication Sig Dispense Refill  . acetaminophen (TYLENOL) 325 MG tablet Take 2 tablets (650 mg total) by mouth every 6 (six) hours as needed for mild pain or headache (fever >/= 101). 10 tablet 0  . amLODipine (NORVASC) 10 MG tablet Take 10 mg by mouth every morning.     . B-D ULTRAFINE III SHORT PEN 31G X 8 MM MISC USE AS DIRECTED ONCE DAILY SUBCUTANEOUSLY  11  . calcium-vitamin D (OSCAL WITH D) 500-200 MG-UNIT tablet Take 1 tablet by mouth daily with lunch.     Marland Kitchen glucose blood test strip OneTouch Ultra Test strips  TEST ONCE A DAY ONCE A DAY FINGERSTICK 90    . hydrALAZINE (APRESOLINE) 25 MG tablet Take 3 tablets (75 mg total) by mouth every 8 (eight)  hours. 270 tablet 0  . hydrochlorothiazide (HYDRODIURIL) 25 MG tablet Take 25 mg by mouth daily.    . Insulin Degludec (TRESIBA FLEXTOUCH) 200 UNIT/ML SOPN Inject 36 Units into the skin at bedtime. 3 pen 0  . Insulin Pen Needle (PEN NEEDLES) 32G X 4 MM MISC BD Ultra-Fine Nano Pen Needle 32 gauge x 5/32"  USE AS DIRECTED DAILY    . JANUVIA 25 MG tablet Take 25 mg by mouth daily.    . Lancets (ONETOUCH ULTRASOFT) lancets OneTouch UltraSoft Lancets  USE TWICE DAILY    . meloxicam (MOBIC) 7.5 MG tablet Take 1 tablet (7.5 mg total) by mouth daily as needed for pain. (Patient not taking: Reported on 05/08/2019) 30 tablet 3  . Multiple Vitamins-Minerals (ADULT ONE DAILY GUMMIES) CHEW Chew 2 tablets by mouth daily with lunch. Centrum    . Multiple Vitamins-Minerals (AIRBORNE) TBEF Take 1 tablet by mouth daily with lunch.    . Multiple Vitamins-Minerals (PRESERVISION AREDS 2) CAPS Take 1 capsule by mouth 2 (two) times daily.    . mupirocin ointment (BACTROBAN) 2 % Apply to right foot ulcer once daily. 30 g 1  . nortriptyline (PAMELOR) 25 MG capsule Take 1 capsule (25 mg total) by mouth at bedtime. 30 capsule 0  . Omega-3 Fatty Acids (FISH OIL)  1200 MG CAPS Take 1,200 mg by mouth daily with lunch.     Marland Kitchen omeprazole (PRILOSEC) 20 MG capsule Take 20 mg by mouth every morning.    . ONE TOUCH ULTRA TEST test strip     . Polyethyl Glycol-Propyl Glycol (SYSTANE) 0.4-0.3 % SOLN Place 1 drop into both eyes 3 (three) times daily as needed (dry eyes).    Marland Kitchen PRESCRIPTION MEDICATION by Intravitreal route every 30 (thirty) days. Ophthalmic injection at MD office in left eye    . RESTASIS 0.05 % ophthalmic emulsion 1 drop 2 (two) times daily.    . simvastatin (ZOCOR) 20 MG tablet Take 20 mg by mouth at bedtime.     . traMADol (ULTRAM) 50 MG tablet Take 1 tablet (50 mg total) by mouth 2 (two) times daily as needed. 20 tablet 0  . vitamin B-12 (CYANOCOBALAMIN) 1000 MCG tablet Take 1,000 mcg by mouth daily with lunch.      Current Facility-Administered Medications on File Prior to Visit  Medication Dose Route Frequency Provider Last Rate Last Admin  . Bevacizumab (AVASTIN) SOLN 1.25 mg  1.25 mg Intravitreal  Bernarda Caffey, MD   1.25 mg at 03/01/18 1344  . Bevacizumab (AVASTIN) SOLN 1.25 mg  1.25 mg Intravitreal  Bernarda Caffey, MD   1.25 mg at 03/31/18 1449     No Known Allergies   No family history on file.   Social History   Occupational History  . Not on file  Tobacco Use  . Smoking status: Former Research scientist (life sciences)  . Smokeless tobacco: Never Used  Vaping Use  . Vaping Use: Never used  Substance and Sexual Activity  . Alcohol use: No    Alcohol/week: 0.0 standard drinks  . Drug use: No  . Sexual activity: Not on file     Immunization History  Administered Date(s) Administered  . Influenza, High Dose Seasonal PF 11/28/2018  . PFIZER SARS-COV-2 Vaccination 04/02/2019, 05/02/2019     Review of systems: Positive Findings in bold print.  Skin: wound right 2nd digit, submet head 2 right foot  Objective:  Physical Exam: Vitals:   07/11/19 0810    Temp: 98.4 F (36.9 C)    Sue [name] is a pleasant 86 y.o. Caucasian female WD,WN in NAD. AAO x 3.  Vascular Examination: Neurovascular status unchanged b/l lower extremities. Capillary refill time to digits immediate b/l. Palpable DP pulses b/l. Palpable PT pulses b/l. Pedal hair absent b/l. Skin temperature gradient within normal limits b/l.  Dermatological Examination: Pedal skin with normal turgor, texture and tone bilaterally. No open wounds bilaterally. No interdigital macerations bilaterally. Toenails 1-5 b/l are painful, elongated, discolored, dystrophic with subungual debris. Pain with dorsal palpation of nailplates. No erythema, no edema, no drainage noted.   Wound Location: submet head 2 right foot There is a minimal amount of devitalized hyperkeratotic tissue present in the wound. Predebridement Wound Measurement:  3.0 x 1.5 x 0  cm. Postdebridement Wound Measurement: same with subdermal hemorrhage Wound Base: Epithelialized, but preulcerative Peri-wound: Calloused Exudate: None: wound tissue dry Blood Loss during debridement: 0 cc('s). Description of tissue removed from ulceration today: devitalized hyperkeratotic tissue. Signs of clinical bacterial infection are absent. Material in wound which inhibits healing/promotes adjacent tissue breakdown:  Devitalized hyperkeratotic tissue.  Wound Location: R 2nd toe There is moderate amount of preulcerative hyperkeratotic tissue present in the wound. Predebridement Wound Measurement:  1.0  x 1.0 x 0 cm. Postdebridement Wound Measurement: 0.3 x 0.2 x 0.1 cm Wound Base: granular Peri-wound: Normal  Exudate: None: wound tissue dry Blood Loss during debridement: 0 cc('s). Description of tissue removed from ulceration today: preulcerative hyperkeratosis. Signs of clinical bacterial infection are absent. Material in wound which inhibits healing/promotes adjacent tissue breakdown: devitalized hyperkeratosis  Musculoskeletal Examination: Normal muscle strength 5/5 to all lower extremity muscle groups bilaterally. No pain crepitus or joint limitation noted with ROM b/l. Hammertoes noted to the R 2nd toe. Utilizes rollator for ambulation assistance.  Neurological Examination: Protective sensation diminished with 10g monofilament b/l. Vibratory sensation diminished b/l.  Hemoglobin A1C Latest Ref Rng & Units 12/17/2018  HGBA1C 4.8 - 5.6 % 9.1(H)  Some recent data might be hidden    Assessment:   No diagnosis found. Plan:  -Diabetic foot examination performed today. -Toenails 1-5 b/l were debrided in length and girth with sterile nail nippers and dremel without iatrogenic bleeding.  -Patient was evaluated and treated and all questions answered.  -Patient/POA/Family member educated on diagnosis and treatment plan of routine ulcer debridement/wound care.  -Ulceration  debridement achieved utilizing sharp debridement with sterile scalpel blade.. -Type/amount of devitalized tissue removed: devitalized hyperkeratotic tissue submet head 2 right foot. Ulcer is healed. -Today's ulcer size post-debridement: Right submet head 2: healed; right 2nd digit 0.3 x 0.2 x 0.1 cm -Ulceration cleansed with wound cleanser. Triple antibiotic ointment applied to base of ulceration and secured with light dressing. -Wound responded well to today's debridement. -Patient risk factors affecting healing of ulcer: diabetic neuropathy, previous history of ulceration, foot deformity, uncontrolled diabetes. -Frequency of debridements needed to achieve healing: every 2 weeks. -Patient to continue diabetic shoe on contralateral foot. -Patient to report any pedal injuries to medical professional immediately. -Patient was given written instructions on offloading of ulceration and daily Mupirocin Ointment dressing changes. Strict orders were given to keep foot dry. Patient was instructed to call immediately if any signs or symptoms of infection arise. Prescription written for Mupirocin Ointment. Patient is to apply to right foot ulcer once daily and cover  ith dressing.   -Patient instructed to report to emergency department with worsening appearance of  ulcer/toe/foot, increased pain, foul odor, increased redness, swelling, drainage, fever, chills, nightsweats, nausea, vomiting, increased blood sugar.  -Continue Darco shoe and Peg Assist for right diabetic foot ulcer. -Still awaiting diabetic shoe certification signature from PCP. Family has a copy of the paperwork to take to Sue [name]'s PCP to get signed. -Patient/POA to call should there be question/concern in the interim.  -ADA Risk Category: Urgent with active pathology of diabetic ulceration, uncontrolled diabetes, diabetic neuropathy and foot deformity.  Return in about 2 weeks (around 07/25/2019) for right 2nd toe ulcer.  Marzetta Board, DPM

## 2019-07-18 NOTE — Therapy (Signed)
[facility] [address] [place], [place], [address] Phone: [phone]   Fax:  [phone]  Physical Therapy Treatment  Patient Details  Name: Sue [name] MRN: [id number] Date of Birth: [birth year] Referring Provider (PT): Eunice Blase, MD   Encounter Date: 07/18/2019   PT End of Session - 07/18/19 1151    Visit Number 7    Number of Visits 13    Date for PT Re-Evaluation 08/03/19    Authorization Type UHC and Medicare, [place] after 15 visits, PN at 10th visit    Progress Note Due on Visit 10    PT Start Time 1100    PT Stop Time 1143    PT Time Calculation (min) 43 min    Activity Tolerance Patient tolerated treatment well    Behavior During Therapy [facility] for tasks assessed/performed           Past Medical History:  Diagnosis Date  . Basal cell carcinoma 11/23/2017   left elbow, right neck TX CX3 5FU   . Basal cell carcinoma 04/04/2019   infil-right sideburn (CX35FU)  . Diabetes mellitus without complication ([place])   . Hypertension   . Hypertensive retinopathy    OU  . Macular degeneration    OU  . SCC (squamous cell carcinoma) 11/23/2017   right forehead TX CX3 5FU    Past Surgical History:  Procedure Laterality Date  . ABDOMINAL HYSTERECTOMY    . CATARACT EXTRACTION    . CHOLECYSTECTOMY    . EYE SURGERY    . SPINE SURGERY      There were no vitals filed for this visit.   Subjective Assessment - 07/18/19 1150    Subjective no shoulder pain and feels like she is walking better.    Pertinent History DM, HTN, macular degeneration, spine surgery almost 20 years ago, eye surgery, gall bladder, hyserectomy, hernia repair, callus on R foot midfoot that was recently shaved    Patient Stated Goals "Stop hurting"              [facility] PT Assessment - 07/18/19 0001      Assessment   Medical Diagnosis R shoulder pain  M25.511    Referring Provider (PT) Hilts, Michael, MD      AROM   Right Shoulder Flexion 155  Degrees    Right Shoulder ABduction 150 Degrees                         [facility] Adult PT Treatment/Exercise - 07/18/19 0001      High Level Balance   High Level Balance Comments in bars: sidesteppin, retrowalking, walking with headturns      Exercises   Other Exercises  sit to stands X 5 reps with UE push up from standard chair then X 10 reps with airex pad in char and no UE pushoff      Shoulder Exercises: Seated   Other Seated Exercises chest press with red band 2X10      Shoulder Exercises: Standing   External Rotation Limitations isometrics 5 sec hold X 20 reps    Internal Rotation Limitations isometrics 5 sec hold X 20 reps    Extension Both;20 reps    Theraband Level (Shoulder Extension) Level 2 (Red)    Row Both;20 reps    Theraband Level (Shoulder Row) Level 2 (Red)    Other Standing Exercises UE ranger X 10 reps flexion, then 10 cirlces    Other Standing Exercises lifting  and placing bottle of water into high cabinent over sink X 10 reps (one shelf down from tallest)      Shoulder Exercises: Pulleys   Scaption 2 minutes      Shoulder Exercises: ROM/Strengthening   Nustep L6 X 8 min UE/LE                       PT Long Term Goals - 07/11/19 1326      PT LONG TERM GOAL #1   Title Pt will be independent in her HEP and progression.    Baseline updated today    Time 6    Period Weeks    Status On-going      PT LONG TERM GOAL #2   Title Pt will improve her R shoulder flexion to >/= 150 degrees with no pain reported.    Baseline no pain and 155 deg    Time 6    Period Weeks    Status Achieved      PT LONG TERM GOAL #3   Title Pt will be able to lift 5# from counter height to above shoulder height with no pain reported.    Baseline unable to currently    Time 6    Period Weeks    Status On-going      PT LONG TERM GOAL #4   Title pt will be albe to improve R 5 time  sit to stand to </= 25 seconds using UE support as needed.     Baseline 20 sec on 6/16    Time 6    Period Weeks    Status Achieved                 Plan - 07/18/19 1152    Clinical Impression Statement Able to progress resistance some with good tolerance. Overall balance improving some and albe to perform higher level balance chalanges today. Continue POC    Personal Factors and Comorbidities Age;Comorbidity 3+    Comorbidities DM, basel cell carcinoma, HTN, macula degeneration, spine surgery, eye surgery    Examination-Activity Limitations Lift;Carry;Reach Overhead    Examination-Participation Restrictions Community Activity;Other    Stability/Clinical Decision Making Stable/Uncomplicated    Rehab Potential Good    PT Frequency 2x / week    PT Duration 6 weeks    PT Treatment/Interventions Cryotherapy;ADLs/Self Care Home Management;Electrical Stimulation;Iontophoresis 4mg /ml Dexamethasone;Moist Heat;Functional mobility training;Stair training;Gait training;Therapeutic activities;Therapeutic exercise;Balance training;Neuromuscular re-education;Patient/family education;Manual techniques;Passive range of motion;Dry needling;Taping    PT Next Visit Plan Rt shoulder strength, balance.    PT Home Exercise Plan [email or web address] : Access Code    Consulted and Agree with Plan of Care Patient;Family member/caregiver    Family Member Consulted daughter           Patient will benefit from skilled therapeutic intervention in order to improve the following deficits and impairments:  Pain, Postural dysfunction, Decreased strength, Decreased range of motion, Improper body mechanics, Decreased balance, Difficulty walking  Visit Diagnosis: Acute pain of right shoulder  Stiffness of right shoulder, not elsewhere classified  Muscle weakness (generalized)     Problem List Patient Active Problem List   Diagnosis Date Noted  . Gastroenteritis due to COVID-19 virus 12/21/2018  . Acute renal failure superimposed on stage 3b chronic kidney disease ([place])  12/21/2018  . Diabetes mellitus type 2, uncontrolled, with complications ([place]) 40/97/3532  . HLD (hyperlipidemia) 12/20/2018  . Anxiety 12/20/2018  . Depression 12/20/2018  . Acute respiratory disease due to COVID-19 virus  12/17/2018  . Dehydration 12/17/2018  . Essential hypertension 12/17/2018  . Type 1 diabetes mellitus without complication ([place]) 37/35/7897  . Diabetic ulcer of foot associated with diabetes mellitus due to underlying condition, limited to breakdown of skin ([place]) 01/11/2018  . Right hip pain 04/15/2016  . Right buttock pain 04/15/2016  . Primary osteoarthritis of right hip 04/15/2016    Silvestre Mesi 07/18/2019, 11:53 AM  [facility] [address] [place], [place], [address] Phone: [phone]   Fax:  [phone]  Name: VALDA CHRISTENSON MRN: [id number] Date of Birth: 01/31/1932

## 2019-07-19 ENCOUNTER — Encounter: Payer: Self-pay | Admitting: Podiatry

## 2019-07-23 ENCOUNTER — Other Ambulatory Visit: Payer: Self-pay

## 2019-07-23 ENCOUNTER — Ambulatory Visit: Payer: Medicare Other | Admitting: Physical Therapy

## 2019-07-23 ENCOUNTER — Encounter: Payer: Self-pay | Admitting: Physical Therapy

## 2019-07-23 DIAGNOSIS — M25611 Stiffness of right shoulder, not elsewhere classified: Secondary | ICD-10-CM | POA: Diagnosis not present

## 2019-07-23 DIAGNOSIS — M6281 Muscle weakness (generalized): Secondary | ICD-10-CM | POA: Diagnosis not present

## 2019-07-23 DIAGNOSIS — M25511 Pain in right shoulder: Secondary | ICD-10-CM | POA: Diagnosis not present

## 2019-07-23 NOTE — Therapy (Signed)
[facility] Physical Therapy [address] [place], [place], [address] Phone: [phone]   Fax:  [phone]  Physical Therapy Treatment  Patient Details  Name: Sue [name] MRN: [id number] Date of Birth: May 02, 1932 Referring Provider (PT): Eunice Blase, MD   Encounter Date: 07/23/2019   PT End of Session - 07/23/19 1126    Visit Number 8    Number of Visits 13    Date for PT Re-Evaluation 08/03/19    Authorization Type UHC and Medicare, [place] after 15 visits, PN at 10th visit    Progress Note Due on Visit 10    PT Start Time 1100    PT Stop Time 1144    PT Time Calculation (min) 44 min    Activity Tolerance Patient tolerated treatment well    Behavior During Therapy [facility] for tasks assessed/performed           Past Medical History:  Diagnosis Date  . Basal cell carcinoma 11/23/2017   left elbow, right neck TX CX3 5FU   . Basal cell carcinoma 04/04/2019   infil-right sideburn (CX35FU)  . Diabetes mellitus without complication ([place])   . Hypertension   . Hypertensive retinopathy    OU  . Macular degeneration    OU  . SCC (squamous cell carcinoma) 11/23/2017   right forehead TX CX3 5FU    Past Surgical History:  Procedure Laterality Date  . ABDOMINAL HYSTERECTOMY    . CATARACT EXTRACTION    . CHOLECYSTECTOMY    . EYE SURGERY    . SPINE SURGERY      There were no vitals filed for this visit.   Subjective Assessment - 07/23/19 1111    Subjective relays no shoulder pain.    Pertinent History DM, HTN, macular degeneration, spine surgery almost 20 years ago, eye surgery, gall bladder, hyserectomy, hernia repair, callus on R foot midfoot that was recently shaved    Patient Stated Goals "Stop hurting"             [facility] Adult PT Treatment/Exercise - 07/23/19 0001      High Level Balance   High Level Balance Comments in bars: sidestepping, retrowalking, walking with headturns      Exercises   Other Exercises  sit to stands X 10  reps with airex pad in chair and no UE pushoff      Shoulder Exercises: Seated   Other Seated Exercises chest press with red band 2X10    Other Seated Exercises cane press OH 2X10, cane and cane IR/ER all 2X10 bilat      Shoulder Exercises: Standing   External Rotation Limitations isometrics 5 sec hold X 15 reps    Internal Rotation Limitations isometrics 5 sec hold X 15 reps    Extension Both;20 reps    Theraband Level (Shoulder Extension) Level 2 (Red)    Row Both;20 reps    Theraband Level (Shoulder Row) Level 2 (Red)    Other Standing Exercises UE ranger X 10 reps flexion, then 10 cirlces    Other Standing Exercises lifting and placing bottle of water into high cabinent over sink X 10 reps (one shelf down from tallest)), then juice can into tallest shelf X 10 reps      Shoulder Exercises: Pulleys   Scaption 2 minutes      Shoulder Exercises: ROM/Strengthening   Nustep L5 X 10 min UE/LE             PT Long Term Goals - 07/11/19 1326  PT LONG TERM GOAL #1   Title Pt will be independent in her HEP and progression.    Baseline updated today    Time 6    Period Weeks    Status On-going      PT LONG TERM GOAL #2   Title Pt will improve her R shoulder flexion to >/= 150 degrees with no pain reported.    Baseline no pain and 155 deg    Time 6    Period Weeks    Status Achieved      PT LONG TERM GOAL #3   Title Pt will be able to lift 5# from counter height to above shoulder height with no pain reported.    Baseline unable to currently    Time 6    Period Weeks    Status On-going      PT LONG TERM GOAL #4   Title pt will be albe to improve R 5 time  sit to stand to </= 25 seconds using UE support as needed.    Baseline 20 sec on 6/16    Time 6    Period Weeks    Status Achieved                 Plan - 07/23/19 1146    Clinical Impression Statement Continues to do well with her Rt shoulder and has no complaints. PT will plan to discharge next week if  she feels confident with this.    Personal Factors and Comorbidities Age;Comorbidity 3+    Comorbidities DM, basel cell carcinoma, HTN, macula degeneration, spine surgery, eye surgery    Examination-Activity Limitations Lift;Carry;Reach Overhead    Examination-Participation Restrictions Community Activity;Other    Rehab Potential Good    PT Frequency 2x / week    PT Duration 6 weeks    PT Treatment/Interventions Cryotherapy;ADLs/Self Care Home Management;Electrical Stimulation;Iontophoresis 4mg /ml Dexamethasone;Moist Heat;Functional mobility training;Stair training;Gait training;Therapeutic activities;Therapeutic exercise;Balance training;Neuromuscular re-education;Patient/family education;Manual techniques;Passive range of motion;Dry needling;Taping    PT Next Visit Plan Rt shoulder strength, balance, plan to discharge 7/8    PT Home Exercise Plan [email or web address] : Access Code    Consulted and Agree with Plan of Care Patient;Family member/caregiver    Family Member Consulted daughter           Patient will benefit from skilled therapeutic intervention in order to improve the following deficits and impairments:  Pain, Postural dysfunction, Decreased strength, Decreased range of motion, Improper body mechanics, Decreased balance, Difficulty walking  Visit Diagnosis: Acute pain of right shoulder  Stiffness of right shoulder, not elsewhere classified  Muscle weakness (generalized)     Problem List Patient Active Problem List   Diagnosis Date Noted  . Gastroenteritis due to COVID-19 virus 12/21/2018  . Acute renal failure superimposed on stage 3b chronic kidney disease ([place]) 12/21/2018  . Diabetes mellitus type 2, uncontrolled, with complications ([place]) 19/62/2297  . HLD (hyperlipidemia) 12/20/2018  . Anxiety 12/20/2018  . Depression 12/20/2018  . Acute respiratory disease due to COVID-19 virus 12/17/2018  . Dehydration 12/17/2018  . Essential hypertension 12/17/2018  . Type 1 diabetes  mellitus without complication ([place]) 98/92/1194  . Diabetic ulcer of foot associated with diabetes mellitus due to underlying condition, limited to breakdown of skin ([place]) 01/11/2018  . Right hip pain 04/15/2016  . Right buttock pain 04/15/2016  . Primary osteoarthritis of right hip 04/15/2016    Silvestre Mesi 07/23/2019, 12:01 PM  [facility] Physical Therapy [address] [place], [place], [address]  Phone: [phone]   Fax:  [phone]  Name: Sue [name] MRN: [id number] Date of Birth: [birth year]

## 2019-07-25 ENCOUNTER — Other Ambulatory Visit: Payer: Self-pay

## 2019-07-25 ENCOUNTER — Encounter: Payer: Self-pay | Admitting: Physical Therapy

## 2019-07-25 ENCOUNTER — Ambulatory Visit: Payer: Medicare Other | Admitting: Physical Therapy

## 2019-07-25 DIAGNOSIS — M25511 Pain in right shoulder: Secondary | ICD-10-CM | POA: Diagnosis not present

## 2019-07-25 DIAGNOSIS — M25611 Stiffness of right shoulder, not elsewhere classified: Secondary | ICD-10-CM

## 2019-07-25 DIAGNOSIS — M6281 Muscle weakness (generalized): Secondary | ICD-10-CM

## 2019-07-25 NOTE — Therapy (Addendum)
[facility] Physical Therapy [address] [place], [place], [address] Phone: [phone]   Fax:  [phone]  Physical Therapy Treatment/Discharge summary PHYSICAL THERAPY DISCHARGE SUMMARY  Visits from Start of Care: 9  Current functional level related to goals / functional outcomes: See below   Remaining deficits: See below   Education / Equipment: HEP Plan: Patient agrees to discharge.  Patient goals were partially met. Patient is being discharged due to not returning since the last visit.  ?????   Elsie Ra, PT, DPT 11/12/19 8:54 AM      Patient Details  Name: Sue [name] MRN: [id number] Date of Birth: [birth year] Referring Provider (PT): Eunice Blase, MD   Encounter Date: 07/25/2019   PT End of Session - 07/25/19 1142    Visit Number 9    Number of Visits 13    Date for PT Re-Evaluation 08/03/19    Authorization Type UHC and Medicare, [place] after 15 visits, PN at 10th visit    Progress Note Due on Visit 10    PT Start Time 1100    PT Stop Time 1140    PT Time Calculation (min) 40 min    Activity Tolerance Patient tolerated treatment well    Behavior During Therapy [facility] for tasks assessed/performed           Past Medical History:  Diagnosis Date  . Basal cell carcinoma 11/23/2017   left elbow, right neck TX CX3 5FU   . Basal cell carcinoma 04/04/2019   infil-right sideburn (CX35FU)  . Diabetes mellitus without complication ([place])   . Hypertension   . Hypertensive retinopathy    OU  . Macular degeneration    OU  . SCC (squamous cell carcinoma) 11/23/2017   right forehead TX CX3 5FU    Past Surgical History:  Procedure Laterality Date  . ABDOMINAL HYSTERECTOMY    . CATARACT EXTRACTION    . CHOLECYSTECTOMY    . EYE SURGERY    . SPINE SURGERY      There were no vitals filed for this visit.   Subjective Assessment - 07/25/19 1119    Subjective relays no complaints with her shoulder but still wants to work on  her balance. She does express some fatigue today.    Pertinent History DM, HTN, macular degeneration, spine surgery almost 20 years ago, eye surgery, gall bladder, hyserectomy, hernia repair, callus on R foot midfoot that was recently shaved    Patient Stated Goals improve balance    Currently in Pain? No/denies                             [facility] Adult PT Treatment/Exercise - 07/25/19 0001      High Level Balance   High Level Balance Comments in bars: sidestepping, retrowalking, wobble board ant-post and lateral      Exercises   Other Exercises  sit to stands X 10 reps with airex pad in chair and no UE pushoff      Shoulder Exercises: Seated   Other Seated Exercises chest press with red band 3X10    Other Seated Exercises cane press OH 2X10      Shoulder Exercises: Standing   External Rotation Limitations isometrics 5 sec hold 2X10 reps    Internal Rotation Limitations isometrics 5 sec hold 2x10 reps    Extension Both;20 reps    Theraband Level (Shoulder Extension) Level 2 (Red)    Row Both;20 reps  Theraband Level (Shoulder Row) Level 2 (Red)    Other Standing Exercises lifting and placing juice can into tallest shelf X 10 reps      Shoulder Exercises: Pulleys   Scaption 2 minutes      Shoulder Exercises: ROM/Strengthening   Nustep L5 X 10 min UE/LE                       PT Long Term Goals - 07/11/19 1326      PT LONG TERM GOAL #1   Title Pt will be independent in her HEP and progression.    Baseline updated today    Time 6    Period Weeks    Status On-going      PT LONG TERM GOAL #2   Title Pt will improve her R shoulder flexion to >/= 150 degrees with no pain reported.    Baseline no pain and 155 deg    Time 6    Period Weeks    Status Achieved      PT LONG TERM GOAL #3   Title Pt will be able to lift 5# from counter height to above shoulder height with no pain reported.    Baseline unable to currently    Time 6    Period  Weeks    Status On-going      PT LONG TERM GOAL #4   Title pt will be albe to improve R 5 time  sit to stand to </= 25 seconds using UE support as needed.    Baseline 20 sec on 6/16    Time 6    Period Weeks    Status Achieved                 Plan - 07/25/19 1143    Clinical Impression Statement Able to progress her Rt shoulder reps without complaints with strength training. Added more balance challenges. POC is up next visit so will reassess for DC vs recert.    Personal Factors and Comorbidities Age;Comorbidity 3+    Comorbidities DM, basel cell carcinoma, HTN, macula degeneration, spine surgery, eye surgery    Examination-Activity Limitations Lift;Carry;Reach Overhead    Examination-Participation Restrictions Community Activity;Other    Rehab Potential Good    PT Frequency 2x / week    PT Duration 6 weeks    PT Treatment/Interventions Cryotherapy;ADLs/Self Care Home Management;Electrical Stimulation;Iontophoresis 24m/ml Dexamethasone;Moist Heat;Functional mobility training;Stair training;Gait training;Therapeutic activities;Therapeutic exercise;Balance training;Neuromuscular re-education;Patient/family education;Manual techniques;Passive range of motion;Dry needling;Taping    PT Next Visit Plan Rt shoulder strength, balance, plan to assess for discharge 7/8    PT Home Exercise Plan [email or web address]: Access Code    Consulted and Agree with Plan of Care Patient;Family member/caregiver    Family Member Consulted daughter           Patient will benefit from skilled therapeutic intervention in order to improve the following deficits and impairments:  Pain, Postural dysfunction, Decreased strength, Decreased range of motion, Improper body mechanics, Decreased balance, Difficulty walking  Visit Diagnosis: Acute pain of right shoulder  Stiffness of right shoulder, not elsewhere classified  Muscle weakness (generalized)     Problem List Patient Active Problem List   Diagnosis  Date Noted  . Gastroenteritis due to COVID-19 virus 12/21/2018  . Acute renal failure superimposed on stage 3b chronic kidney disease ([place] 12/21/2018  . Diabetes mellitus type 2, uncontrolled, with complications ([place] 154/65/0354 . HLD (hyperlipidemia) 12/20/2018  . Anxiety 12/20/2018  . Depression  12/20/2018  . Acute respiratory disease due to COVID-19 virus 12/17/2018  . Dehydration 12/17/2018  . Essential hypertension 12/17/2018  . Type 1 diabetes mellitus without complication ([place]) 95/09/3265  . Diabetic ulcer of foot associated with diabetes mellitus due to underlying condition, limited to breakdown of skin ([place]) 01/11/2018  . Right hip pain 04/15/2016  . Right buttock pain 04/15/2016  . Primary osteoarthritis of right hip 04/15/2016    Silvestre Mesi 07/25/2019, 11:44 AM  [facility] Physical Therapy [address] [place], [place], [address] Phone: [phone]   Fax:  [phone]  Name: Sue [name] MRN: [id number] Date of Birth: Dec 21, 1932

## 2019-07-27 ENCOUNTER — Encounter: Payer: Self-pay | Admitting: Podiatry

## 2019-07-27 ENCOUNTER — Other Ambulatory Visit: Payer: Self-pay

## 2019-07-27 ENCOUNTER — Ambulatory Visit: Payer: Medicare Other | Admitting: Podiatry

## 2019-07-27 VITALS — Temp 97.2°F

## 2019-07-27 DIAGNOSIS — M2041 Other hammer toe(s) (acquired), right foot: Secondary | ICD-10-CM

## 2019-07-27 DIAGNOSIS — M2042 Other hammer toe(s) (acquired), left foot: Secondary | ICD-10-CM

## 2019-07-27 DIAGNOSIS — E1142 Type 2 diabetes mellitus with diabetic polyneuropathy: Secondary | ICD-10-CM | POA: Diagnosis not present

## 2019-07-27 DIAGNOSIS — Z872 Personal history of diseases of the skin and subcutaneous tissue: Secondary | ICD-10-CM

## 2019-07-29 NOTE — Progress Notes (Signed)
Subjective:  Patient ID: Sue [name], female    DOB: Jun 12, 1932,  MRN: [id number]  86 y.o. female is seen for follow up of 2nd digit and submet head 2  ulceration plantar right foot.  Her daughter is present during today's visit. Daughter still hasn't had a chance to get diabetic shoe certification form to PCP.   Ulceration location:  Distal tip right 2nd digit  Offloading achieved with [employer]co shoe.  She is still waiting on a new pair of diabetic shoes.  Patient relates appears to be better than last visit.  Hx confirmed with Sue [name] and her daughter.  She denies fever, chills, night sweats, nausea, or vomiting.  Past Medical History:  Diagnosis Date  . Basal cell carcinoma 11/23/2017   left elbow, right neck TX CX3 5FU   . Basal cell carcinoma 04/04/2019   infil-right sideburn (CX35FU)  . Diabetes mellitus without complication ([place])   . Hypertension   . Hypertensive retinopathy    OU  . Macular degeneration    OU  . SCC (squamous cell carcinoma) 11/23/2017   right forehead TX CX3 5FU     Past Surgical History:  Procedure Laterality Date  . ABDOMINAL HYSTERECTOMY    . CATARACT EXTRACTION    . CHOLECYSTECTOMY    . EYE SURGERY    . SPINE SURGERY       Current Outpatient Medications on File Prior to Visit  Medication Sig Dispense Refill  . acetaminophen (TYLENOL) 325 MG tablet Take 2 tablets (650 mg total) by mouth every 6 (six) hours as needed for mild pain or headache (fever >/= 101). 10 tablet 0  . amLODipine (NORVASC) 10 MG tablet Take 10 mg by mouth every morning.     . B-D ULTRAFINE III SHORT PEN 31G X 8 MM MISC USE AS DIRECTED ONCE DAILY SUBCUTANEOUSLY  11  . calcium-vitamin D (OSCAL WITH D) 500-200 MG-UNIT tablet Take 1 tablet by mouth daily with lunch.     Marland Kitchen glucose blood test strip OneTouch Ultra Test strips  TEST ONCE A DAY ONCE A DAY FINGERSTICK 90    . hydrALAZINE (APRESOLINE) 25 MG tablet Take 3 tablets (75 mg total) by mouth every 8 (eight) hours.  270 tablet 0  . hydrochlorothiazide (HYDRODIURIL) 25 MG tablet Take 25 mg by mouth daily.    . Insulin Degludec (TRESIBA FLEXTOUCH) 200 UNIT/ML SOPN Inject 36 Units into the skin at bedtime. 3 pen 0  . Insulin Pen Needle (PEN NEEDLES) 32G X 4 MM MISC BD Ultra-Fine Nano Pen Needle 32 gauge x 5/32"  USE AS DIRECTED DAILY    . JANUVIA 25 MG tablet Take 25 mg by mouth daily.    . Lancets (ONETOUCH ULTRASOFT) lancets OneTouch UltraSoft Lancets  USE TWICE DAILY    . meloxicam (MOBIC) 7.5 MG tablet Take 1 tablet (7.5 mg total) by mouth daily as needed for pain. 30 tablet 3  . Multiple Vitamins-Minerals (ADULT ONE DAILY GUMMIES) CHEW Chew 2 tablets by mouth daily with lunch. Centrum    . Multiple Vitamins-Minerals (AIRBORNE) TBEF Take 1 tablet by mouth daily with lunch.    . Multiple Vitamins-Minerals (PRESERVISION AREDS 2) CAPS Take 1 capsule by mouth 2 (two) times daily.    . mupirocin ointment (BACTROBAN) 2 % Apply to right foot ulcer once daily. 30 g 1  . nortriptyline (PAMELOR) 25 MG capsule Take 1 capsule (25 mg total) by mouth at bedtime. 30 capsule 0  . Omega-3 Fatty Acids (FISH OIL) 1200 MG  CAPS Take 1,200 mg by mouth daily with lunch.     Marland Kitchen omeprazole (PRILOSEC) 20 MG capsule Take 20 mg by mouth every morning.    . ONE TOUCH ULTRA TEST test strip     . Polyethyl Glycol-Propyl Glycol (SYSTANE) 0.4-0.3 % SOLN Place 1 drop into both eyes 3 (three) times daily as needed (dry eyes).    Marland Kitchen PRESCRIPTION MEDICATION by Intravitreal route every 30 (thirty) days. Ophthalmic injection at MD office in left eye    . RESTASIS 0.05 % ophthalmic emulsion 1 drop 2 (two) times daily.    . simvastatin (ZOCOR) 20 MG tablet Take 20 mg by mouth at bedtime.     . traMADol (ULTRAM) 50 MG tablet Take 1 tablet (50 mg total) by mouth 2 (two) times daily as needed. 20 tablet 0  . vitamin B-12 (CYANOCOBALAMIN) 1000 MCG tablet Take 1,000 mcg by mouth daily with lunch.     Current Facility-Administered Medications on File  Prior to Visit  Medication Dose Route Frequency Provider Last Rate Last Admin  . Bevacizumab (AVASTIN) SOLN 1.25 mg  1.25 mg Intravitreal  Bernarda Caffey, MD   1.25 mg at 03/01/18 1344  . Bevacizumab (AVASTIN) SOLN 1.25 mg  1.25 mg Intravitreal  Bernarda Caffey, MD   1.25 mg at 03/31/18 1449     No Known Allergies   History reviewed. No pertinent family history.   Social History   Occupational History  . Not on file  Tobacco Use  . Smoking status: Former Research scientist (life sciences)  . Smokeless tobacco: Never Used  Vaping Use  . Vaping Use: Never used  Substance and Sexual Activity  . Alcohol use: No    Alcohol/week: 0.0 standard drinks  . Drug use: No  . Sexual activity: Not on file     Immunization History  Administered Date(s) Administered  . Influenza, High Dose Seasonal PF 11/28/2018  . PFIZER SARS-COV-2 Vaccination 04/02/2019, 05/02/2019     Review of systems: Positive Findings in bold print.  Skin: wound right 2nd digit, submet head 2 right foot  Objective:  Physical Exam: Vitals:   07/27/19 1132  Temp: (!) 97.2 F (36.2 C)    [name] is a pleasant 86 y.o. Caucasian female WD,WN in NAD. AAO x 3.  Vascular Examination: Neurovascular status unchanged b/l lower extremities. Capillary refill time to digits immediate b/l. Palpable DP pulses b/l. Palpable PT pulses b/l. Pedal hair absent b/l. Skin temperature gradient within normal limits b/l.  Dermatological Examination: Pedal skin with normal turgor, texture and tone bilaterally. No interdigital macerations bilaterally. Toenails 1-5 b/l well maintained with adequate length. No erythema, no edema, no drainage, no flocculence.   Wound Location: submet head 2 right foot healed. Wound Location: R 2nd toe healed  Musculoskeletal Examination: Normal muscle strength 5/5 to all lower extremity muscle groups bilaterally. No pain crepitus or joint limitation noted with ROM b/l. Hammertoes noted to the R 2nd toe. Utilizes cane for  ambulation assistance.  Neurological Examination: Protective sensation diminished with 10g monofilament b/l. Vibratory sensation diminished b/l.  Hemoglobin A1C Latest Ref Rng & Units 12/17/2018  HGBA1C 4.8 - 5.6 % 9.1(H)  Some recent data might be hidden    Assessment:   No diagnosis found. Plan:  -Patient was evaluated and treated and all questions answered.  -Patient/POA/Family member educated on diagnosis and treatment plan of routine ulcer debridement/wound care.  -Patient risk factors affecting healing of ulcer: diabetic neuropathy, previous history of ulceration, foot deformity, uncontrolled diabetes. -Patient to report  any pedal injuries to medical professional immediately. -Patient instructed to report to emergency department with worsening appearance of  ulcer/toe/foot, increased pain, foul odor, increased redness, swelling, drainage, fever, chills, nightsweats, nausea, vomiting, increased blood sugar.  -Still awaiting diabetic shoe certification signature from PCP. Family has a copy of the paperwork to take to Ms. Mainwaring's PCP to get signed. Right now, Ms. Millett is in the middle of moving and paperwork is thought to be in a folder at her home. Her daughter will check. -Patient/POA to call should there be question/concern in the interim.  -ADA Risk Category: Urgent with active pathology of diabetic ulceration, uncontrolled diabetes, diabetic neuropathy and foot deformity.  Return in about 7 weeks (around 09/14/2019).  Marzetta Board, DPM

## 2019-08-02 ENCOUNTER — Encounter: Payer: Medicare Other | Admitting: Physical Therapy

## 2019-08-08 ENCOUNTER — Encounter: Payer: Self-pay | Admitting: Dermatology

## 2019-08-08 ENCOUNTER — Ambulatory Visit: Payer: Medicare Other | Admitting: Dermatology

## 2019-08-08 ENCOUNTER — Other Ambulatory Visit: Payer: Self-pay

## 2019-08-08 DIAGNOSIS — C4441 Basal cell carcinoma of skin of scalp and neck: Secondary | ICD-10-CM

## 2019-08-08 DIAGNOSIS — L821 Other seborrheic keratosis: Secondary | ICD-10-CM

## 2019-08-08 DIAGNOSIS — L570 Actinic keratosis: Secondary | ICD-10-CM

## 2019-08-08 NOTE — Patient Instructions (Addendum)
Head and neck skin examination on Sue [name] date of birth November 19, 1932.  Lesion on the right sideburn is flat but did leave a little discoloration and can safely be watched.  On the forehead are subtle pink flat crusts which are low risk of becoming skin cancer.  On the lower left neck are a cluster of brown textured keratoses which do not require watching or removal.  On the right side of the neck is an enlarged pearly 1 cm nodule which is likely another nonmole skin cancer.  Right now it is not bothersome or bleeding.  If the family does decide that it is best to remove this they will schedule 30 minutes and I will take a biopsy and treat the spot at the same time if they prefer to watch this and it is stable this would have virtually no risk of ever spreading beyond the skin.  If everything is doing well I will plan on doing a general skin check on Sue [name] in the winter.

## 2019-08-22 ENCOUNTER — Encounter (INDEPENDENT_AMBULATORY_CARE_PROVIDER_SITE_OTHER): Payer: Medicare Other | Admitting: Ophthalmology

## 2019-08-22 NOTE — Progress Notes (Signed)
[facility] Note  08/29/2019   CHIEF COMPLAINT Patient presents for Retina Follow Up  HISTORY OF PRESENT ILLNESS: Sue [name] is a 86 y.o. female who presents to the clinic today for:   HPI    Retina Follow Up    Patient presents with  Wet AMD.  In left eye.  This started months ago.  Severity is moderate.  Duration of 8 weeks.  Since onset it is stable.  I, the attending physician,  performed the HPI with the patient and updated documentation appropriately.          Comments    86 y/o female pt here for 8 wk f/u for exu ARMD OS.  Feels VA OS may be the same or slightly improved.  No change in [place] OD.  Denies pain, FOL, floaters.  Restasis BID OU.  AT prn OU.  BS over 200 this a.m.  A1C unknown.       Last edited by Bernarda Caffey, MD on 08/29/2019 12:42 PM. (History)      Referring physician: Merrilee Seashore, [address] [place],  [place] [address]  HISTORICAL INFORMATION:   Selected notes from the MEDICAL RECORD NUMBER Referred by Dr. Quentin Ore for concern of ARMD OU    CURRENT MEDICATIONS: Current Outpatient Medications (Ophthalmic Drugs)  Medication Sig  . Polyethyl Glycol-Propyl Glycol (SYSTANE) 0.4-0.3 % SOLN Place 1 drop into both eyes 3 (three) times daily as needed (dry eyes).  . RESTASIS 0.05 % ophthalmic emulsion 1 drop 2 (two) times daily.   No current facility-administered medications for this visit. (Ophthalmic Drugs)   Current Outpatient Medications (Other)  Medication Sig  . acetaminophen (TYLENOL) 325 MG tablet Take 2 tablets (650 mg total) by mouth every 6 (six) hours as needed for mild pain or headache (fever >/= 101).  Marland Kitchen amLODipine (NORVASC) 10 MG tablet Take 10 mg by mouth every morning.   . B-D ULTRAFINE III SHORT PEN 31G X 8 MM MISC USE AS DIRECTED ONCE DAILY SUBCUTANEOUSLY  . calcium-vitamin D (OSCAL WITH D) 500-200 MG-UNIT tablet Take 1 tablet by mouth daily with lunch.   . enalapril (VASOTEC)  20 MG tablet   . glucose blood test strip OneTouch Ultra Test strips  TEST ONCE A DAY ONCE A DAY FINGERSTICK 90  . hydrALAZINE (APRESOLINE) 25 MG tablet Take 3 tablets (75 mg total) by mouth every 8 (eight) hours.  . hydrochlorothiazide (HYDRODIURIL) 25 MG tablet Take 25 mg by mouth daily.  . Insulin Degludec (TRESIBA FLEXTOUCH) 200 UNIT/ML SOPN Inject 36 Units into the skin at bedtime.  . Insulin Pen Needle (PEN NEEDLES) 32G X 4 MM MISC BD Ultra-Fine Nano Pen Needle 32 gauge x 5/32"  USE AS DIRECTED DAILY  . JANUVIA 25 MG tablet Take 25 mg by mouth daily.  . Lancets (ONETOUCH ULTRASOFT) lancets OneTouch UltraSoft Lancets  USE TWICE DAILY  . meloxicam (MOBIC) 7.5 MG tablet Take 1 tablet (7.5 mg total) by mouth daily as needed for pain.  . Multiple Vitamins-Minerals (ADULT ONE DAILY GUMMIES) CHEW Chew 2 tablets by mouth daily with lunch. Centrum  . Multiple Vitamins-Minerals (AIRBORNE) TBEF Take 1 tablet by mouth daily with lunch.  . Multiple Vitamins-Minerals (PRESERVISION AREDS 2) CAPS Take 1 capsule by mouth 2 (two) times daily.  . mupirocin ointment (BACTROBAN) 2 % Apply to right foot ulcer once daily.  . nortriptyline (PAMELOR) 25 MG capsule Take 1 capsule (25 mg total) by mouth at  bedtime.  . Omega-3 Fatty Acids (FISH OIL) 1200 MG CAPS Take 1,200 mg by mouth daily with lunch.   Marland Kitchen omeprazole (PRILOSEC) 20 MG capsule Take 20 mg by mouth every morning.  . ONE TOUCH ULTRA TEST test strip   . PRESCRIPTION MEDICATION by Intravitreal route every 30 (thirty) days. Ophthalmic injection at MD office in left eye  . simvastatin (ZOCOR) 20 MG tablet Take 20 mg by mouth at bedtime.   . traMADol (ULTRAM) 50 MG tablet Take 1 tablet (50 mg total) by mouth 2 (two) times daily as needed.  . vitamin B-12 (CYANOCOBALAMIN) 1000 MCG tablet Take 1,000 mcg by mouth daily with lunch.   Current Facility-Administered Medications (Other)  Medication Route  . Bevacizumab (AVASTIN) SOLN 1.25 mg Intravitreal  .  Bevacizumab (AVASTIN) SOLN 1.25 mg Intravitreal      REVIEW OF SYSTEMS: ROS    Positive for: Gastrointestinal, Genitourinary, Endocrine, Eyes   Negative for: Constitutional, Neurological, Skin, Musculoskeletal, HENT, Cardiovascular, Respiratory, Psychiatric, Allergic/Imm, Heme/Lymph   Last edited by Matthew Folks, COA on 08/29/2019  9:46 AM. (History)       ALLERGIES No Known Allergies  PAST MEDICAL HISTORY Past Medical History:  Diagnosis Date  . Basal cell carcinoma 11/23/2017   left elbow, right neck TX CX3 5FU   . Basal cell carcinoma 04/04/2019   infil-right sideburn (CX35FU)  . Diabetes mellitus without complication ([place])   . Hypertension   . Hypertensive retinopathy    OU  . Macular degeneration    OU  . SCC (squamous cell carcinoma) 11/23/2017   right forehead TX CX3 5FU   Past Surgical History:  Procedure Laterality Date  . ABDOMINAL HYSTERECTOMY    . CATARACT EXTRACTION    . CHOLECYSTECTOMY    . EYE SURGERY    . SPINE SURGERY      FAMILY HISTORY History reviewed. No pertinent family history.  SOCIAL HISTORY Social History   Tobacco Use  . Smoking status: Former Research scientist (life sciences)  . Smokeless tobacco: Never Used  Vaping Use  . Vaping Use: Never used  Substance Use Topics  . Alcohol use: No    Alcohol/week: 0.0 standard drinks  . Drug use: No         OPHTHALMIC EXAM:  Base Eye Exam    Visual Acuity (Snellen - Linear)      Right Left   Dist cc 20/30 -2 20/40 -2   Dist ph cc NI 20/30 -2   Correction: Glasses       Tonometry (Tonopen, 9:50 AM)      Right Left   Pressure 13 12       Pupils      Dark Light Shape React APD   Right 4 3 Round Minimal None   Left 4 3 Round Minimal None       Visual Fields (Counting fingers)      Left Right    Full Full       Extraocular Movement      Right Left    Full, Ortho Full, Ortho       Neuro/Psych    Oriented x3: Yes   Mood/Affect: Normal       Dilation    Both eyes: 1.0% Mydriacyl, 2.5%  Phenylephrine @ 9:50 AM        Slit Lamp and Fundus Exam    Slit Lamp Exam      Right Left   Lids/Lashes Dermatochalasis - upper lid, Telangiectasia, mild MGD Dermatochalasis - upper lid,  Telangiectasia, Meibomian gland dysfunction   Conjunctiva/Sclera White and quiet White and quiet   Cornea Temporal Well healed cataract wounds, mild Arcus, trace endo pigment nasally, 1+PEE Arcus, Temporal Well healed cataract wounds, 1-2+ PEE   Anterior Chamber Deep and quiet Deep and quiet   Iris Round and moderately dilated to 4.59mm Round and moderately dilated to 30mm, Iris atrophy from 0100 to 0230, peripheral irodotimy at 0200   Lens 3-piece PC IOL in good position PC IOL in good position   Vitreous Vitreous syneresis, Posterior vitreous detachment, Weiss ring Vitreous syneresis, PVD, vitreous condensations       Fundus Exam      Right Left   Disc 360 Peripapillary atrophy, diffuse mild pallor, compact, sharp rim mild pallor, Peripapillary atrophy and pigmentation   C/D Ratio 0.2 0.3   Macula Blunted foveal reflex, +PED, RPE mottling and clumping, Drusen, No heme or edema Blunted foveal reflex, PED / +CNVM with stable resolution of low lying SRF, RPE mottling and clumping, Drusen, No heme or edema, persistent CWS inferior macula -- linear   Vessels Vascular attenuation Vascular attenuation   Periphery Attached, 360 Reticular degeneration Attached, 360 Reticular degeneration          IMAGING AND PROCEDURES  Imaging and Procedures for @TODAY @  OCT, Retina - OU - Both Eyes       Right Eye Quality was good. Central Foveal Thickness: 230. Progression has been stable. Findings include normal foveal contour, no IRF, no SRF, pigment epithelial detachment, outer retinal atrophy, retinal drusen  (persistent PED).   Left Eye Quality was good. Central Foveal Thickness: 254. Progression has been stable. Findings include no IRF, retinal drusen , pigment epithelial detachment, outer retinal atrophy,  epiretinal membrane, abnormal foveal contour, no SRF (stable improvement in IRF overlying low lying PED).   Notes *Images captured and stored on drive  Diagnosis / Impression:  OD: Non-Exudative ARMD -- stable OS: Exudative ARMD --stable improvement in IRF overlying low lying PED   Clinical management:  See below  Abbreviations: NFP - Normal foveal profile. CME - cystoid macular edema. PED - pigment epithelial detachment. IRF - intraretinal fluid. SRF - subretinal fluid. EZ - ellipsoid zone. ERM - epiretinal membrane. ORA - outer retinal atrophy. ORT - outer retinal tubulation. SRHM - subretinal hyper-reflective material         Intravitreal Injection, Pharmacologic Agent - OS - Left Eye       Time Out 08/29/2019. 10:11 AM. Confirmed correct patient, procedure, site, and patient consented.   Anesthesia Topical anesthesia was used. Anesthetic medications included Lidocaine 2%, Proparacaine 0.5%.   Procedure Preparation included 5% betadine to ocular surface, eyelid speculum. A supplied needle was used.   Injection:  1.25 mg Bevacizumab (AVASTIN) SOLN   NDC: 95638-756-43, Lot: [id number] , Expiration date: 09/19/2019   Route: Intravitreal, Site: Left Eye, Waste: 0 mL  Post-op Post injection exam found visual acuity of at least counting fingers. The patient tolerated the procedure well. There were no complications. The patient received written and verbal post procedure care education.                 ASSESSMENT/PLAN:    ICD-10-CM   1. Exudative age-related macular degeneration of left eye with active choroidal neovascularization (HCC)  H35.3221 Intravitreal Injection, Pharmacologic Agent - OS - Left Eye    Bevacizumab (AVASTIN) SOLN 1.25 mg  2. Retinal edema  H35.81 OCT, Retina - OU - Both Eyes  3. 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 Diabetes mellitus type 2 without retinopathy ([place])  E11.9   5. Essential  hypertension  I10   6. Hypertensive retinopathy of both eyes  H35.033   7. Pseudophakia of both eyes  Z96.1    1,2. Exudative age-related macular degeneration, left eye  - conversion of OS from nonexudative to exudative ARMD prior to 02.05.20 visit  - s/p IVA OS #1 (02.05.20), #2 (03.04.20), #3 (04.30.20), #4 (06.02.20), #5 (07.14.20), #6 (9.1.20), #7 (10.27.20), #8 (12.23.20), #9 (02.03.21), #10 (03.17.21), #11 (04.28.21), #12 (06.09.21)  - **history of recurrent SRF with extension to 8 wks, noted on 12.23.20**  - OCT shows stable resolution of SRF overlying PED at 8 wk interval today  - BCVA stable at 20/30   - recommend IVA #13 OS today 08.04.21 -- maintenance  - pt wishes to be treated with IVA OS  - risks and benefits of intravitreal avastin discussed with patient  - informed consent obtained  - see procedure note  - Avastin informed consent obtained and scanned 02.03.21  - f/u in 8 wks for DFE, OCT, likely injection OS   3. Age related macular degeneration, non-exudative, right eye  - stable, former pt of Dr. Zigmund Daniel -- no significant change in OCT or exam  - The incidence, anatomy, and pathology of dry AMD, risk of progression, and the AREDS and AREDS 2 study including smoking risks discussed with patient.  - recommend amsler grid monitoring  4. Diabetes mellitus, type 2 without retinopathy  - The incidence, risk factors for progression, natural history and treatment options for diabetic retinopathy  were discussed with patient.    - The need for close monitoring of blood glucose, blood pressure, and serum lipids, avoiding cigarette or any type of tobacco, and the need for long term follow up was also discussed with patient.  - f/u in 1 year  5,6. Hypertensive retinopathy OU  - discussed importance of tight BP control  - monitor  7. Pseudophakia OU  - s/p CE/IOL OU  - beautiful surgeries, doing well  - monitor  Ophthalmic Meds Ordered this visit:  Meds ordered this  encounter  Medications  . Bevacizumab (AVASTIN) SOLN 1.25 mg   Return in about 8 weeks (around 10/24/2019) for f/u exu ARMD OS, DFE, OCT.  There are no Patient Instructions on file for this visit.   This document serves as a record of services personally performed by Gardiner Sleeper, MD, PhD. It was created on their behalf by Roselee Nova, COMT. The creation of this record is the provider's dictation and/or activities during the visit.  Electronically signed by: Roselee Nova, COMT 08/29/19 12:47 PM  This document serves as a record of services personally performed by Gardiner Sleeper, MD, PhD. It was created on their behalf by San Jetty. Owens Shark, OA an ophthalmic technician. The creation of this record is the provider's dictation and/or activities during the visit.    Electronically signed by: San Jetty. Marguerita Merles 08.08.2021 12:47 PM  Gardiner Sleeper, M.D., Ph.D. Diseases & Surgery of the Retina and Vitreous [facility]  I have reviewed the above documentation for accuracy and completeness, and I agree with the above. Gardiner Sleeper, M.D., Ph.D. 08/29/19 12:47 PM   Abbreviations: M myopia (nearsighted); A astigmatism; H hyperopia (farsighted); P presbyopia; Mrx spectacle prescription;  CTL contact lenses; OD right eye; OS left eye; OU both eyes  XT exotropia; ET esotropia; PEK punctate epithelial keratitis; PEE  punctate epithelial erosions; DES dry eye syndrome; MGD meibomian gland dysfunction; ATs artificial tears; PFAT's preservative free artificial tears; [place] nuclear sclerotic cataract; PSC posterior subcapsular cataract; ERM epi-retinal membrane; PVD posterior vitreous detachment; RD retinal detachment; DM diabetes mellitus; DR diabetic retinopathy; NPDR non-proliferative diabetic retinopathy; PDR proliferative diabetic retinopathy; CSME clinically significant macular edema; DME diabetic macular edema; dbh dot blot hemorrhages; CWS cotton wool spot; POAG primary open  angle glaucoma; C/D cup-to-disc ratio; HVF humphrey visual field; GVF goldmann visual field; OCT optical coherence tomography; IOP intraocular pressure; BRVO Branch retinal vein occlusion; CRVO central retinal vein occlusion; CRAO central retinal artery occlusion; BRAO branch retinal artery occlusion; RT retinal tear; SB scleral buckle; PPV pars plana vitrectomy; VH Vitreous hemorrhage; PRP panretinal laser photocoagulation; IVK intravitreal kenalog; VMT vitreomacular traction; MH Macular hole;  NVD neovascularization of the disc; NVE neovascularization elsewhere; AREDS age related eye disease study; ARMD age related macular degeneration; POAG primary open angle glaucoma; EBMD epithelial/anterior basement membrane dystrophy; ACIOL anterior chamber intraocular lens; IOL intraocular lens; PCIOL posterior chamber intraocular lens; Phaco/IOL phacoemulsification with intraocular lens placement; [place] photorefractive keratectomy; LASIK laser assisted in situ keratomileusis; HTN hypertension; DM diabetes mellitus; COPD chronic obstructive pulmonary disease

## 2019-08-29 ENCOUNTER — Other Ambulatory Visit: Payer: Self-pay

## 2019-08-29 ENCOUNTER — Encounter (INDEPENDENT_AMBULATORY_CARE_PROVIDER_SITE_OTHER): Payer: Self-pay | Admitting: Ophthalmology

## 2019-08-29 ENCOUNTER — Ambulatory Visit (INDEPENDENT_AMBULATORY_CARE_PROVIDER_SITE_OTHER): Payer: Medicare Other | Admitting: Ophthalmology

## 2019-08-29 DIAGNOSIS — H353221 Exudative age-related macular degeneration, left eye, with active choroidal neovascularization: Secondary | ICD-10-CM | POA: Diagnosis not present

## 2019-08-29 DIAGNOSIS — I10 Essential (primary) hypertension: Secondary | ICD-10-CM

## 2019-08-29 DIAGNOSIS — H353112 Nonexudative age-related macular degeneration, right eye, intermediate dry stage: Secondary | ICD-10-CM | POA: Diagnosis not present

## 2019-08-29 DIAGNOSIS — H35033 Hypertensive retinopathy, bilateral: Secondary | ICD-10-CM

## 2019-08-29 DIAGNOSIS — E119 Type 2 diabetes mellitus without complications: Secondary | ICD-10-CM | POA: Diagnosis not present

## 2019-08-29 DIAGNOSIS — H3581 Retinal edema: Secondary | ICD-10-CM | POA: Diagnosis not present

## 2019-08-29 DIAGNOSIS — Z961 Presence of intraocular lens: Secondary | ICD-10-CM

## 2019-08-29 MED ORDER — BEVACIZUMAB CHEMO INJECTION 1.25MG/0.05ML SYRINGE FOR KALEIDOSCOPE
1.2500 mg | INTRAVITREAL | Status: AC | PRN
  Administered 2019-08-29: 1.25 mg via INTRAVITREAL

## 2019-09-02 ENCOUNTER — Encounter: Payer: Self-pay | Admitting: Dermatology

## 2019-09-02 NOTE — Progress Notes (Signed)
   Follow-Up Visit   Subjective  Sue [name] is a 86 y.o. female who presents for the following: Follow-up (3 month f/u- right sideburn- no concerns-healing good).  New spot Location: Left neck Duration:  Quality:  Associated Signs/Symptoms: Modifying Factors:  Severity:  Timing: Context: History of nonmelanoma skin cancer but the spot on her neck does not bleed or bother patient.  Objective  Well appearing patient in no apparent distress; mood and affect are within normal limits.  A focused examination was performed including Head and neck.. Relevant physical exam findings are noted in the Assessment and Plan.   Assessment & Plan    Basal cell carcinoma of skin of scalp and neck Right Anterior Neck  Family chooses to leave this unless there is clinical change.  This is not unreasonable.  AK (actinic keratosis) (2) Mid Forehead; Left Forehead  Can safely defer therapy if stable.  Seborrheic keratosis Left Anterior Neck  Leave if stable    Head and neck skin examination on Sue [name] date of birth Jan 03, 1933.  Lesion on the right sideburn is flat but did leave a little discoloration and can safely be watched.  On the forehead are subtle pink flat crusts which are low risk of becoming skin cancer.  On the lower left neck are a cluster of brown textured keratoses which do not require watching or removal.  On the right side of the neck is an enlarged pearly 1 cm nodule which is likely another nonmole skin cancer.  Right now it is not bothersome or bleeding.  If the family does decide that it is best to remove this they will schedule 30 minutes and I will take a biopsy and treat the spot at the same time if they prefer to watch this and it is stable this would have virtually no risk of ever spreading beyond the skin.  If everything is doing well I will plan on doing a general skin check on Sue [name] in the winter.  I, Lavonna Monarch, MD, have reviewed all documentation for  this visit.  The documentation on 09/02/19 for the exam, diagnosis, procedures, and orders are all accurate and complete.

## 2019-09-04 ENCOUNTER — Ambulatory Visit: Payer: Medicare Other | Admitting: Podiatry

## 2019-09-04 ENCOUNTER — Ambulatory Visit: Payer: Medicare Other | Admitting: Orthotics

## 2019-09-04 ENCOUNTER — Other Ambulatory Visit: Payer: Self-pay

## 2019-09-04 ENCOUNTER — Other Ambulatory Visit: Payer: Self-pay | Admitting: Podiatry

## 2019-09-04 ENCOUNTER — Ambulatory Visit (INDEPENDENT_AMBULATORY_CARE_PROVIDER_SITE_OTHER): Payer: Medicare Other

## 2019-09-04 DIAGNOSIS — M79671 Pain in right foot: Secondary | ICD-10-CM

## 2019-09-04 DIAGNOSIS — L97512 Non-pressure chronic ulcer of other part of right foot with fat layer exposed: Secondary | ICD-10-CM

## 2019-09-04 DIAGNOSIS — M2041 Other hammer toe(s) (acquired), right foot: Secondary | ICD-10-CM

## 2019-09-04 DIAGNOSIS — E1142 Type 2 diabetes mellitus with diabetic polyneuropathy: Secondary | ICD-10-CM | POA: Diagnosis not present

## 2019-09-04 DIAGNOSIS — M8580 Other specified disorders of bone density and structure, unspecified site: Secondary | ICD-10-CM

## 2019-09-04 DIAGNOSIS — L84 Corns and callosities: Secondary | ICD-10-CM | POA: Diagnosis not present

## 2019-09-04 DIAGNOSIS — B351 Tinea unguium: Secondary | ICD-10-CM

## 2019-09-04 DIAGNOSIS — L97513 Non-pressure chronic ulcer of other part of right foot with necrosis of muscle: Secondary | ICD-10-CM

## 2019-09-04 DIAGNOSIS — E08621 Diabetes mellitus due to underlying condition with foot ulcer: Secondary | ICD-10-CM

## 2019-09-04 DIAGNOSIS — M2042 Other hammer toe(s) (acquired), left foot: Secondary | ICD-10-CM | POA: Diagnosis not present

## 2019-09-04 MED ORDER — SILVER SULFADIAZINE 1 % EX CREA: TOPICAL_CREAM | CUTANEOUS | 0 refills | Status: AC

## 2019-09-04 NOTE — Progress Notes (Signed)
  Subjective:  Patient ID: Sue [name], female    DOB: Dec 12, 1932,  MRN: [id number]  Chief Complaint  Patient presents with  . Nail Problem    Nail trim 1-5 bilateral  . Callouses    Pt states painful plantar callouses    86 y.o. female presents with the above complaint. History confirmed with patient.   Objective:  Physical Exam: warm, good capillary refill, nail exam onychomycosis of the toenails, no trophic changes or ulcerative lesions. DP pulses palpable, PT pulses palpable and protective sensation absent  Right Foot: Hammertoe right 2nd toe with ulcer with exposed 2nd toe distal phalanx periosteum. Slight periwound chronic erythema. No active warmth. Serosanguinous drainage noted.   No images are attached to the encounter.  Radiographs: 3 view right foot no definite osseous erosion but potential partial 2nd toe lysis seen  Assessment:   1. Ulcer of toe, right, with necrosis of muscle ([place])   2. Onychomycosis   3. Diabetic peripheral neuropathy associated with type 2 diabetes mellitus ([place])   4. Corns and callosities   5. Diabetic ulcer of other part of right foot associated with diabetes mellitus due to underlying condition, limited to breakdown of skin ([place])   6. Acquired hammertoes of both feet   7. Bone erosion determined by x-ray      Plan:  Patient was evaluated and treated and all questions answered.  Onychomycosis, Diabetes and DPN -Patient is diabetic with a qualifying condition for at risk foot care.  Procedure: Nail Debridement Rationale: Patient meets criteria for routine foot care due to DPN Type of Debridement: manual, sharp debridement. Instrumentation: Nail nipper, rotary burr. Number of Nails: 10   Procedure: Paring of Lesion Rationale: painful hyperkeratotic lesion Type of Debridement: manual, sharp debridement. Instrumentation: 312 blade Number of Lesions: 1   Right 2nd Toe Ulcer -XR reviewed with patient -Wound cleansed and  debrided -Given questionable signs of lysis will order MRI to evaluate for OM. -May need partial amputation vs flexor tenotomy.  Procedure: Selective Debridement of Wound Rationale: Removal of devitalized tissue from the wound to promote healing.  Pre-Debridement Wound Measurements: 0.5 cm x 0.5 cm x 0.3 cm  Post-Debridement Wound Measurements: same as pre-debridement. Type of Debridement: sharp selective Tissue Removed: Devitalized soft-tissue Dressing: Dry, sterile, compression dressing. Disposition: Patient tolerated procedure well. Patient to return in 1 week for follow-up.    No follow-ups on file.

## 2019-09-04 NOTE — Progress Notes (Signed)
Wrong shoes ordered; reordered MJ

## 2019-09-06 ENCOUNTER — Telehealth: Payer: Self-pay | Admitting: *Deleted

## 2019-09-06 DIAGNOSIS — E1142 Type 2 diabetes mellitus with diabetic polyneuropathy: Secondary | ICD-10-CM

## 2019-09-06 DIAGNOSIS — L97513 Non-pressure chronic ulcer of other part of right foot with necrosis of muscle: Secondary | ICD-10-CM

## 2019-09-06 DIAGNOSIS — M86671 Other chronic osteomyelitis, right ankle and foot: Secondary | ICD-10-CM

## 2019-09-06 DIAGNOSIS — M79671 Pain in right foot: Secondary | ICD-10-CM

## 2019-09-06 NOTE — Telephone Encounter (Signed)
-----   Message from Evelina Bucy, DPM sent at 09/04/2019 11:23 PM EDT ----- Can we order MRI right foot to eval for Osteomyelitis right 2nd toe

## 2019-09-06 NOTE — Telephone Encounter (Signed)
Faxed orders, 6 months of clinicals and demographics to [facility].

## 2019-09-09 ENCOUNTER — Ambulatory Visit
Admission: RE | Admit: 2019-09-09 | Discharge: 2019-09-09 | Disposition: A | Discharge status: Home / Self Care | Payer: Medicare Other | Source: Ambulatory Visit | Attending: Podiatry | Admitting: Podiatry

## 2019-09-09 DIAGNOSIS — M19071 Primary osteoarthritis, right ankle and foot: Secondary | ICD-10-CM | POA: Diagnosis not present

## 2019-09-09 DIAGNOSIS — M6258 Muscle wasting and atrophy, not elsewhere classified, other site: Secondary | ICD-10-CM | POA: Diagnosis not present

## 2019-09-09 DIAGNOSIS — M86671 Other chronic osteomyelitis, right ankle and foot: Secondary | ICD-10-CM

## 2019-09-09 DIAGNOSIS — L03115 Cellulitis of right lower limb: Secondary | ICD-10-CM | POA: Diagnosis not present

## 2019-09-09 DIAGNOSIS — M7989 Other specified soft tissue disorders: Secondary | ICD-10-CM | POA: Diagnosis not present

## 2019-09-14 ENCOUNTER — Ambulatory Visit: Payer: Medicare Other | Admitting: Podiatry

## 2019-09-14 ENCOUNTER — Telehealth: Payer: Self-pay | Admitting: Podiatry

## 2019-09-14 ENCOUNTER — Ambulatory Visit: Payer: Self-pay | Admitting: Podiatry

## 2019-09-14 ENCOUNTER — Other Ambulatory Visit: Payer: Self-pay

## 2019-09-14 DIAGNOSIS — Z01818 Encounter for other preprocedural examination: Secondary | ICD-10-CM | POA: Diagnosis not present

## 2019-09-14 DIAGNOSIS — M79671 Pain in right foot: Secondary | ICD-10-CM

## 2019-09-14 DIAGNOSIS — E1142 Type 2 diabetes mellitus with diabetic polyneuropathy: Secondary | ICD-10-CM | POA: Diagnosis not present

## 2019-09-14 DIAGNOSIS — M86671 Other chronic osteomyelitis, right ankle and foot: Secondary | ICD-10-CM | POA: Diagnosis not present

## 2019-09-14 DIAGNOSIS — L97513 Non-pressure chronic ulcer of other part of right foot with necrosis of muscle: Secondary | ICD-10-CM | POA: Diagnosis not present

## 2019-09-14 NOTE — Telephone Encounter (Signed)
Pt daughter called stated that pt has a appt with you today she stated that you scheduled the appt I do not see it [please assist

## 2019-09-14 NOTE — Progress Notes (Addendum)
See other note

## 2019-09-14 NOTE — Telephone Encounter (Signed)
Patient seen today

## 2019-09-17 ENCOUNTER — Telehealth: Payer: Self-pay

## 2019-09-17 NOTE — Patient Instructions (Addendum)
DUE TO COVID-19 ONLY ONE VISITOR IS ALLOWED IN WAITING ROOM (VISITOR WILL HAVE A TEMPERATURE CHECK ON ARRIVAL AND MUST WEAR A FACE MASK THE ENTIRE TIME.)  ONCE YOU ARE ADMITTED TO YOUR PRIVATE ROOM, THE SAME ONE VISITOR IS ALLOWED TO VISIT DURING VISITING HOURS ONLY.  COVID SWAB TESTING MUST BE COMPLETED ON:  Tuesday, 09-25-19 @ 2:00 PM    [address] [place], [place] [address]  (Must self quarantine after testing. Follow instructions on handout.)  Your procedure is scheduled on:  Friday 09-28-19  Report to [facility] AT  11:20 P.M.   Call this number if you have problems the morning of surgery:[phone].   OUR ADDRESS IS [place].  WE ARE LOCATED IN [facility].                                     REMEMBER:  DO NOT EAT FOOD AFTER MIDNIGHT .    May have liquids until 10:00 AM day of surgery  CLEAR LIQUID DIET  Foods Allowed                                                                     Foods Excluded  Water, Black Coffee and tea, regular and decaf          liquids that you cannot  Plain Jell-O in any flavor  (No red)                                     see through such as: Fruit ices (not with fruit pulp)                                      milk, soups, orange juice              Iced Popsicles (No red)                                      All solid food                                   Apple juices Sports drinks like Gatorade (No red) Lightly seasoned clear broth or consume(fat free) Sugar, honey syrup    Complete one G2 drink the morning of surgery at  10:00 AM the day of surgery.   BRUSH YOUR TEETH THE MORNING OF SURGERY.  TAKE THESE MEDICATIONS MORNING OF SURGERY WITH A SIP OF WATER:  Amlodipine, Nortriptyline, Hydralazine   DO NOT WEAR JEWERLY, MAKE UP, OR NAIL POLISH.  DO NOT WEAR LOTIONS, POWDERS, PERFUMES/COLOGNE OR DEODORANT.  DO NOT SHAVE FOR 24 HOURS PRIOR TO DAY OF SURGERY.    CONTACTS, GLASSES, OR  DENTURES MAY NOT BE WORN TO SURGERY.  [facility] IS NOT RESPONSIBLE  FOR ANY BELONGINGS.           DO NOT TAKE ANY ORAL DIABETIC MEDICATIONS DAY OF YOUR SURGERY                   How to Manage Your Diabetes Before and After Surgery  Why is it important to control my blood sugar before and after surgery? . Improving blood sugar levels before and after surgery helps healing and can limit problems. . A way of improving blood sugar control is eating a healthy diet by: o  Eating less sugar and carbohydrates o  Increasing activity/exercise o  Talking with your doctor about reaching your blood sugar goals . High blood sugars (greater than 180 mg/dL) can raise your risk of infections and slow your recovery, so you will need to focus on controlling your diabetes during the weeks before surgery. . Make sure that the doctor who takes care of your diabetes knows about your planned surgery including the date and location.  How do I manage my blood sugar before surgery? . Check your blood sugar at least 4 times a day, starting 2 days before surgery, to make sure that the level is not too high or low. o Check your blood sugar the morning of your surgery when you wake up and every 2 hours until you get to the Short Stay unit. . If your blood sugar is less than 70 mg/dL, you will need to treat for low blood sugar: o Do not take insulin. o Treat a low blood sugar (less than 70 mg/dL) with  cup of clear juice (cranberry or apple), 4 glucose tablets, OR glucose gel. o Recheck blood sugar in 15 minutes after treatment (to make sure it is greater than 70 mg/dL). If your blood sugar is not greater than 70 mg/dL on recheck, call [phone] for further instructions. . Report your blood sugar to the short stay nurse when you get to Short Stay.  . If you are admitted to the hospital after surgery: o Your blood sugar will be checked by the staff and you will probably be given  insulin after surgery (instead of oral diabetes medicines) to make sure you have good blood sugar levels. o The goal for blood sugar control after surgery is 80-180 mg/dL.   WHAT DO I DO ABOUT MY DIABETES MEDICATION?  Marland Kitchen Do not take oral diabetes medicines (pills) the morning of surgery.  . THE DAY BEFORE SURGERY:  TAKE JANUVIA AS PRESCRIBED  . THE NIGHT BEFORE SURGERY:  Inject 11 units of [place]   . THE MORNING OF SURGERY:  Do not take Januvia.                         Please read over the following fact sheets you were given: IF YOU HAVE QUESTIONS ABOUT YOUR PRE OP INSTRUCTIONS PLEASE  CALL [phone]   [facility] - Preparing for Surgery Before surgery, you can play an important role.  Because skin is not sterile, your skin needs to be as free of germs as possible.  You can reduce the number of germs on your skin by washing with CHG (chlorahexidine gluconate) soap before surgery.  CHG is an antiseptic cleaner which kills germs and bonds with the skin to continue killing germs even after washing. Please DO NOT use if you have an allergy to CHG or antibacterial soaps.  If your skin becomes reddened/irritated stop using  the CHG and inform your nurse when you arrive at Short Stay. Do not shave (including legs and underarms) for at least 48 hours prior to the first CHG shower.  You may shave your face/neck.  Please follow these instructions carefully:  1.  Shower with CHG Soap the night before surgery and the  morning of surgery.  2.  If you choose to wash your hair, wash your hair first as usual with your normal  shampoo.  3.  After you shampoo, rinse your hair and body thoroughly to remove the shampoo.                             4.  Use CHG as you would any other liquid soap.  You can apply chg directly to the skin and wash.  Gently with a scrungie or clean washcloth.  5.  Apply the CHG Soap to your body ONLY FROM THE NECK DOWN.   Do   not use on face/ open                            Wound or open sores. Avoid contact with eyes, ears mouth and   genitals (private parts).                       Wash face,  Genitals (private parts) with your normal soap.             6.  Wash thoroughly, paying special attention to the area where your    surgery  will be performed.  7.  Thoroughly rinse your body with warm water from the neck down.  8.  DO NOT shower/wash with your normal soap after using and rinsing off the CHG Soap.                9.  Pat yourself dry with a clean towel.            10.  Wear clean pajamas.            11.  Place clean sheets on your bed the night of your first shower and do not  sleep with pets. Day of Surgery : Do not apply any lotions/deodorants the morning of surgery.  Please wear clean clothes to the hospital/surgery center.  FAILURE TO FOLLOW THESE INSTRUCTIONS MAY RESULT IN THE CANCELLATION OF YOUR SURGERY  PATIENT SIGNATURE_________________________________  NURSE SIGNATURE__________________________________  ________________________________________________________________________   Adam Phenix  An incentive spirometer is a tool that can help keep your lungs clear and active. This tool measures how well you are filling your lungs with each breath. Taking long deep breaths may help reverse or decrease the chance of developing breathing (pulmonary) problems (especially infection) following:  A long period of time when you are unable to move or be active. BEFORE THE PROCEDURE   If the spirometer includes an indicator to show your best effort, your nurse or respiratory therapist will set it to a desired goal.  If possible, sit up straight or lean slightly forward. Try not to slouch.  Hold the incentive spirometer in an upright position. INSTRUCTIONS FOR USE  1. Sit on the edge of your bed if possible, or sit up as far as you can in bed or on a chair. 2. Hold the incentive spirometer in an upright position. 3. Breathe out  normally. 4. Place the mouthpiece in your  mouth and seal your lips tightly around it. 5. Breathe in slowly and as deeply as possible, raising the piston or the ball toward the top of the column. 6. Hold your breath for 3-5 seconds or for as long as possible. Allow the piston or ball to fall to the bottom of the column. 7. Remove the mouthpiece from your mouth and breathe out normally. 8. Rest for a few seconds and repeat Steps 1 through 7 at least 10 times every 1-2 hours when you are awake. Take your time and take a few normal breaths between deep breaths. 9. The spirometer may include an indicator to show your best effort. Use the indicator as a goal to work toward during each repetition. 10. After each set of 10 deep breaths, practice coughing to be sure your lungs are clear. If you have an incision (the cut made at the time of surgery), support your incision when coughing by placing a pillow or rolled up towels firmly against it. Once you are able to get out of bed, walk around indoors and cough well. You may stop using the incentive spirometer when instructed by your caregiver.  RISKS AND COMPLICATIONS  Take your time so you do not get dizzy or light-headed.  If you are in pain, you may need to take or ask for pain medication before doing incentive spirometry. It is harder to take a deep breath if you are having pain. AFTER USE  Rest and breathe slowly and easily.  It can be helpful to keep track of a log of your progress. Your caregiver can provide you with a simple table to help with this. If you are using the spirometer at home, follow these instructions: [place] IF:   You are having difficultly using the spirometer.  You have trouble using the spirometer as often as instructed.  Your pain medication is not giving enough relief while using the spirometer.  You develop fever of 100.5 F (38.1 C) or higher. SEEK IMMEDIATE MEDICAL CARE IF:   You cough up bloody sputum  that had not been present before.  You develop fever of 102 F (38.9 C) or greater.  You develop worsening pain at or near the incision site. MAKE SURE YOU:   Understand these instructions.  Will watch your condition.  Will get help right away if you are not doing well or get worse. Document Released: 05/24/2006 Document Revised: 04/05/2011 Document Reviewed: 07/25/2006 [facility] Patient Information 2014 [place], [place].   ________________________________________________________________________

## 2019-09-17 NOTE — Progress Notes (Addendum)
COVID Vaccine Completed: x2 Date COVID Vaccine completed: 04-02-19 & 05-02-19 COVID vaccine manufacturer: [employer]   PCP - Merrilee Seashore, MD Cardiologist -   Chest x-ray - 12-17-18 in Epic EKG - 12-18-18 in Epic Stress Test -  ECHO -  Cardiac Cath -   Sleep Study -  CPAP -   Fasting Blood Sugar - 104 TO 170 Checks Blood Sugar 3 times a day  Blood Thinner Instructions: Aspirin Instructions: Last Dose:  Anesthesia review:   Patient denies shortness of breath, fever, cough and chest pain at PAT appointment   Patient verbalized understanding of instructions that were given to them at the PAT appointment. Patient was also instructed that they will need to review over the PAT instructions again at home before surgery.

## 2019-09-17 NOTE — Telephone Encounter (Signed)
Thank you :)

## 2019-09-17 NOTE — Telephone Encounter (Signed)
Will call today at conclusion of clinic hours

## 2019-09-17 NOTE — Telephone Encounter (Signed)
Pt had a question about upcoming surgery.

## 2019-09-17 NOTE — Telephone Encounter (Signed)
Called and spoke to patient's daughter who was the one who called. Answered questions about anesthesia, risks and benefits of procedure, post-op course

## 2019-09-19 DIAGNOSIS — D508 Other iron deficiency anemias: Secondary | ICD-10-CM | POA: Diagnosis not present

## 2019-09-19 DIAGNOSIS — E1121 Type 2 diabetes mellitus with diabetic nephropathy: Secondary | ICD-10-CM | POA: Diagnosis not present

## 2019-09-19 DIAGNOSIS — E1165 Type 2 diabetes mellitus with hyperglycemia: Secondary | ICD-10-CM | POA: Diagnosis not present

## 2019-09-19 DIAGNOSIS — R296 Repeated falls: Secondary | ICD-10-CM | POA: Diagnosis not present

## 2019-09-19 DIAGNOSIS — E782 Mixed hyperlipidemia: Secondary | ICD-10-CM | POA: Diagnosis not present

## 2019-09-19 DIAGNOSIS — I129 Hypertensive chronic kidney disease with stage 1 through stage 4 chronic kidney disease, or unspecified chronic kidney disease: Secondary | ICD-10-CM | POA: Diagnosis not present

## 2019-09-21 ENCOUNTER — Encounter (HOSPITAL_COMMUNITY): Payer: Self-pay

## 2019-09-21 ENCOUNTER — Encounter (HOSPITAL_COMMUNITY)
Admission: RE | Admit: 2019-09-21 | Discharge: 2019-09-21 | Disposition: A | Discharge status: Home / Self Care | Payer: Medicare Other | Source: Ambulatory Visit | Attending: Podiatry | Admitting: Podiatry

## 2019-09-21 ENCOUNTER — Other Ambulatory Visit: Payer: Self-pay

## 2019-09-21 DIAGNOSIS — E119 Type 2 diabetes mellitus without complications: Secondary | ICD-10-CM | POA: Diagnosis not present

## 2019-09-21 DIAGNOSIS — Z01812 Encounter for preprocedural laboratory examination: Secondary | ICD-10-CM | POA: Diagnosis not present

## 2019-09-21 HISTORY — DX: Unspecified osteoarthritis, unspecified site: M19.90

## 2019-09-21 HISTORY — DX: Anemia, unspecified: D64.9

## 2019-09-21 HISTORY — DX: COVID-19: U07.1

## 2019-09-21 HISTORY — DX: Pneumonia, unspecified organism: J18.9

## 2019-09-21 LAB — CBC
HCT: 37.1 % (ref 36.0–46.0)
Hemoglobin: 11.4 g/dL — ABNORMAL LOW (ref 12.0–15.0)
MCH: 27.3 pg (ref 26.0–34.0)
MCHC: 30.7 g/dL (ref 30.0–36.0)
MCV: 89 fL (ref 80.0–100.0)
Platelets: 335 10*3/uL (ref 150–400)
RBC: 4.17 MIL/uL (ref 3.87–5.11)
RDW: 14.3 % (ref 11.5–15.5)
WBC: 9.4 10*3/uL (ref 4.0–10.5)
nRBC: 0 % (ref 0.0–0.2)

## 2019-09-21 LAB — BASIC METABOLIC PANEL
Anion gap: 11 (ref 5–15)
BUN: 44 mg/dL — ABNORMAL HIGH (ref 8–23)
CO2: 28 mmol/L (ref 22–32)
Calcium: 9.3 mg/dL (ref 8.9–10.3)
Chloride: 102 mmol/L (ref 98–111)
Creatinine, Ser: 1.49 mg/dL — ABNORMAL HIGH (ref 0.44–1.00)
GFR calc Af Amer: 37 mL/min — ABNORMAL LOW (ref >60)
GFR calc non Af Amer: 32 mL/min — ABNORMAL LOW (ref >60)
Glucose, Bld: 149 mg/dL — ABNORMAL HIGH (ref 70–99)
Potassium: 3.6 mmol/L (ref 3.5–5.1)
Sodium: 141 mmol/L (ref 135–145)

## 2019-09-21 LAB — HEMOGLOBIN A1C
Hgb A1c MFr Bld: 8.8 % — ABNORMAL HIGH (ref 4.8–5.6)
Mean Plasma Glucose: 205.86 mg/dL

## 2019-09-21 LAB — GLUCOSE, CAPILLARY: Glucose-Capillary: 170 mg/dL — ABNORMAL HIGH (ref 70–99)

## 2019-09-21 NOTE — Progress Notes (Signed)
BMP sent to Dr. March Rummage to review

## 2019-09-24 NOTE — Progress Notes (Signed)
A1c results sent to Dr. March Rummage to review.

## 2019-09-25 ENCOUNTER — Other Ambulatory Visit (HOSPITAL_COMMUNITY)
Admission: RE | Admit: 2019-09-25 | Discharge: 2019-09-25 | Disposition: A | Discharge status: Home / Self Care | Payer: Medicare Other | Source: Ambulatory Visit | Attending: Podiatry | Admitting: Podiatry

## 2019-09-25 DIAGNOSIS — Z01812 Encounter for preprocedural laboratory examination: Secondary | ICD-10-CM | POA: Insufficient documentation

## 2019-09-25 DIAGNOSIS — Z20822 Contact with and (suspected) exposure to covid-19: Secondary | ICD-10-CM | POA: Insufficient documentation

## 2019-09-25 LAB — SARS CORONAVIRUS 2 (TAT 6-24 HRS): SARS Coronavirus 2: NEGATIVE

## 2019-09-26 ENCOUNTER — Telehealth: Payer: Self-pay

## 2019-09-26 ENCOUNTER — Encounter (HOSPITAL_BASED_OUTPATIENT_CLINIC_OR_DEPARTMENT_OTHER): Payer: Self-pay | Admitting: Podiatry

## 2019-09-26 DIAGNOSIS — Z0000 Encounter for general adult medical examination without abnormal findings: Secondary | ICD-10-CM | POA: Diagnosis not present

## 2019-09-26 DIAGNOSIS — E782 Mixed hyperlipidemia: Secondary | ICD-10-CM | POA: Diagnosis not present

## 2019-09-26 DIAGNOSIS — Z01818 Encounter for other preprocedural examination: Secondary | ICD-10-CM | POA: Diagnosis not present

## 2019-09-26 DIAGNOSIS — E118 Type 2 diabetes mellitus with unspecified complications: Secondary | ICD-10-CM | POA: Diagnosis not present

## 2019-09-26 DIAGNOSIS — E1165 Type 2 diabetes mellitus with hyperglycemia: Secondary | ICD-10-CM | POA: Diagnosis not present

## 2019-09-26 NOTE — Progress Notes (Signed)
Spoke with daughter kin and made aware surgery time changed to 1200 pm 09-28-2019, no solid food candy gum or mints after midnight, clear liquids from midnight until 900 am then npo, arrive 1000 am [facility] 09-28-2019

## 2019-09-26 NOTE — Telephone Encounter (Signed)
DOS 09/28/2019  TOE AMPUTATION 2ND RT - 28825  [employer] EFFECTIVE DATE - 01/26/2019  PLAN DEDUCTIBLE - $0.00 OUT OF POCKET - $3600.00 W/ $2824.05 REMAINING  CO-INSURANCE 0% / Day OUTPATIENT SURGERY 0% / [place] $295 / Day OUTPATIENT SURGERY $295 / [place]  Notification or Prior Authorization is not required for the requested services  Decision ID #:[id number]

## 2019-09-26 NOTE — Progress Notes (Signed)
Left message with shelley durant pt hemaglobin a1c is 8.8 and to please let dr price know. Also requested h and p from [facility] for 09-28-2019 surgery

## 2019-09-26 NOTE — Progress Notes (Signed)
Medical clearance ( H & P) dr Merrilee Seashore dated 09-26-2019 on chart for 09-28-2019 surgery

## 2019-09-27 NOTE — Anesthesia Preprocedure Evaluation (Addendum)
Anesthesia Evaluation  Patient identified by MRN, date of birth, ID band Patient awake    Reviewed: Allergy & Precautions, NPO status , Patient's Chart, lab work & pertinent test results  Airway Mallampati: II  TM Distance: >3 FB Neck ROM: Full    Dental no notable dental hx. (+) Dental Advisory Given   Pulmonary former smoker,    Pulmonary exam normal breath sounds clear to auscultation       Cardiovascular hypertension, Normal cardiovascular exam Rhythm:Regular Rate:Normal     Neuro/Psych PSYCHIATRIC DISORDERS Anxiety    GI/Hepatic negative GI ROS, Neg liver ROS,   Endo/Other  diabetes  Renal/GU      Musculoskeletal  (+) Arthritis ,   Abdominal   Peds  Hematology negative hematology ROS (+) anemia ,   Anesthesia Other Findings   Reproductive/Obstetrics negative OB ROS                            Anesthesia Physical Anesthesia Plan  ASA: II  Anesthesia Plan: MAC   Post-op Pain Management:    Induction:   PONV Risk Score and Plan: 2 and Treatment may vary due to age or medical condition  Airway Management Planned: Natural Airway and Nasal Cannula  Additional Equipment: None  Intra-op Plan:   Post-operative Plan:   Informed Consent: I have reviewed the patients History and Physical, chart, labs and discussed the procedure including the risks, benefits and alternatives for the proposed anesthesia with the patient or authorized representative who has indicated his/her understanding and acceptance.     Dental advisory given  Plan Discussed with: CRNA and Anesthesiologist  Anesthesia Plan Comments:        Anesthesia Quick Evaluation

## 2019-09-28 ENCOUNTER — Ambulatory Visit (HOSPITAL_BASED_OUTPATIENT_CLINIC_OR_DEPARTMENT_OTHER): Payer: Medicare Other | Admitting: Anesthesiology

## 2019-09-28 ENCOUNTER — Ambulatory Visit (HOSPITAL_COMMUNITY)
Admission: RE | Admit: 2019-09-28 | Discharge: 2019-09-28 | Disposition: A | Discharge status: Home / Self Care | Payer: Medicare Other | Attending: Podiatry | Admitting: Podiatry

## 2019-09-28 ENCOUNTER — Encounter (HOSPITAL_BASED_OUTPATIENT_CLINIC_OR_DEPARTMENT_OTHER)
Admission: RE | Disposition: A | Discharge status: Home / Self Care | Payer: Self-pay | Source: Home / Self Care | Attending: Podiatry

## 2019-09-28 ENCOUNTER — Other Ambulatory Visit: Payer: Self-pay

## 2019-09-28 ENCOUNTER — Encounter (HOSPITAL_BASED_OUTPATIENT_CLINIC_OR_DEPARTMENT_OTHER): Payer: Self-pay | Admitting: Podiatry

## 2019-09-28 DIAGNOSIS — E1122 Type 2 diabetes mellitus with diabetic chronic kidney disease: Secondary | ICD-10-CM | POA: Diagnosis not present

## 2019-09-28 DIAGNOSIS — I10 Essential (primary) hypertension: Secondary | ICD-10-CM | POA: Diagnosis not present

## 2019-09-28 DIAGNOSIS — E1169 Type 2 diabetes mellitus with other specified complication: Secondary | ICD-10-CM | POA: Diagnosis not present

## 2019-09-28 DIAGNOSIS — Z794 Long term (current) use of insulin: Secondary | ICD-10-CM | POA: Insufficient documentation

## 2019-09-28 DIAGNOSIS — L089 Local infection of the skin and subcutaneous tissue, unspecified: Secondary | ICD-10-CM | POA: Diagnosis not present

## 2019-09-28 DIAGNOSIS — Z8616 Personal history of COVID-19: Secondary | ICD-10-CM | POA: Insufficient documentation

## 2019-09-28 DIAGNOSIS — J9600 Acute respiratory failure, unspecified whether with hypoxia or hypercapnia: Secondary | ICD-10-CM | POA: Diagnosis not present

## 2019-09-28 DIAGNOSIS — M869 Osteomyelitis, unspecified: Secondary | ICD-10-CM | POA: Diagnosis not present

## 2019-09-28 DIAGNOSIS — D649 Anemia, unspecified: Secondary | ICD-10-CM | POA: Insufficient documentation

## 2019-09-28 DIAGNOSIS — E1142 Type 2 diabetes mellitus with diabetic polyneuropathy: Secondary | ICD-10-CM | POA: Insufficient documentation

## 2019-09-28 DIAGNOSIS — E785 Hyperlipidemia, unspecified: Secondary | ICD-10-CM | POA: Diagnosis not present

## 2019-09-28 DIAGNOSIS — L97519 Non-pressure chronic ulcer of other part of right foot with unspecified severity: Secondary | ICD-10-CM | POA: Diagnosis not present

## 2019-09-28 DIAGNOSIS — Z79899 Other long term (current) drug therapy: Secondary | ICD-10-CM | POA: Insufficient documentation

## 2019-09-28 DIAGNOSIS — M1990 Unspecified osteoarthritis, unspecified site: Secondary | ICD-10-CM | POA: Diagnosis not present

## 2019-09-28 DIAGNOSIS — I129 Hypertensive chronic kidney disease with stage 1 through stage 4 chronic kidney disease, or unspecified chronic kidney disease: Secondary | ICD-10-CM | POA: Diagnosis not present

## 2019-09-28 DIAGNOSIS — M86671 Other chronic osteomyelitis, right ankle and foot: Secondary | ICD-10-CM | POA: Diagnosis not present

## 2019-09-28 DIAGNOSIS — N1832 Chronic kidney disease, stage 3b: Secondary | ICD-10-CM | POA: Insufficient documentation

## 2019-09-28 DIAGNOSIS — Z87891 Personal history of nicotine dependence: Secondary | ICD-10-CM | POA: Diagnosis not present

## 2019-09-28 DIAGNOSIS — Z85828 Personal history of other malignant neoplasm of skin: Secondary | ICD-10-CM | POA: Insufficient documentation

## 2019-09-28 DIAGNOSIS — F419 Anxiety disorder, unspecified: Secondary | ICD-10-CM | POA: Insufficient documentation

## 2019-09-28 DIAGNOSIS — E11319 Type 2 diabetes mellitus with unspecified diabetic retinopathy without macular edema: Secondary | ICD-10-CM | POA: Diagnosis not present

## 2019-09-28 HISTORY — PX: AMPUTATION TOE: SHX6595

## 2019-09-28 HISTORY — DX: Chronic kidney disease, stage 3 unspecified: N18.30

## 2019-09-28 HISTORY — DX: Other specified symptoms and signs involving the circulatory and respiratory systems: R09.89

## 2019-09-28 HISTORY — DX: Hyperlipidemia, unspecified: E78.5

## 2019-09-28 HISTORY — DX: Other complications of anesthesia, initial encounter: T88.59XA

## 2019-09-28 HISTORY — DX: Type 2 diabetes mellitus with diabetic chronic kidney disease: E11.22

## 2019-09-28 HISTORY — DX: Type 2 diabetes mellitus with diabetic neuropathy, unspecified: E11.40

## 2019-09-28 HISTORY — DX: Chronic kidney disease, unspecified: N18.9

## 2019-09-28 HISTORY — DX: Type 2 diabetes mellitus with diabetic chronic kidney disease: I12.9

## 2019-09-28 LAB — GLUCOSE, CAPILLARY
Glucose-Capillary: 158 mg/dL — ABNORMAL HIGH (ref 70–99)
Glucose-Capillary: 121 mg/dL — ABNORMAL HIGH (ref 70–99)

## 2019-09-28 LAB — CBC WITH DIFFERENTIAL/PLATELET
Basophils Absolute: 0.1 10*3/uL (ref 0.0–0.1)
Basophils Relative: 1 %
Eosinophils Absolute: 0 10*3/uL (ref 0.0–0.5)
Eosinophils Relative: 0 %
HCT: 30.2 % — ABNORMAL LOW (ref 36.0–46.0)
Hemoglobin: 9.9 g/dL — ABNORMAL LOW (ref 12.0–15.0)
Lymphocytes Relative: 8 %
Lymphs Abs: 0.9 10*3/uL (ref 0.7–4.0)
MCH: 28 pg (ref 26.0–34.0)
MCHC: 32.8 g/dL (ref 30.0–36.0)
MCV: 85.3 fL (ref 80.0–100.0)
Monocytes Absolute: 1.3 10*3/uL — ABNORMAL HIGH (ref 0.1–1.0)
Monocytes Relative: 11 %
Neutro Abs: 8.8 10*3/uL — ABNORMAL HIGH (ref 1.7–7.7)
Neutrophils Relative %: 75 %
Platelets: 474 10*3/uL — ABNORMAL HIGH (ref 150–400)
RBC: 3.54 MIL/uL — ABNORMAL LOW (ref 3.87–5.11)
RDW: 13.7 % (ref 11.5–15.5)
WBC: 11.7 10*3/uL — ABNORMAL HIGH (ref 4.0–10.5)
nRBC: 0 % (ref 0.0–0.2)
nRBC: 0 /100 WBC

## 2019-09-28 SURGERY — AMPUTATION, TOE
Anesthesia: Monitor Anesthesia Care | Site: Toe | Laterality: Right

## 2019-09-28 MED ORDER — LIDOCAINE HCL 1 % IJ SOLN
INTRAMUSCULAR | Status: DC | PRN
  Administered 2019-09-28: 60 mg via INTRADERMAL

## 2019-09-28 MED ORDER — HYDROCODONE-ACETAMINOPHEN 5-325 MG PO TABS
1.0000 | ORAL_TABLET | ORAL | 0 refills | Status: AC | PRN
Start: 2019-09-28 — End: 2020-09-27

## 2019-09-28 MED ORDER — CEPHALEXIN 500 MG PO CAPS: 500.0000 mg | ORAL_CAPSULE | Freq: Two times a day (BID) | ORAL | 0 refills | Status: DC

## 2019-09-28 MED ORDER — VANCOMYCIN HCL 1000 MG IV SOLR
INTRAVENOUS | Status: DC | PRN
  Administered 2019-09-28: 1000 mg via TOPICAL

## 2019-09-28 MED ORDER — PROPOFOL 10 MG/ML IV BOLUS
INTRAVENOUS | Status: AC
  Filled 2019-09-28: qty 20

## 2019-09-28 MED ORDER — ACETAMINOPHEN 10 MG/ML IV SOLN: 1000.0000 mg | Freq: Once | INTRAVENOUS | Status: DC | PRN

## 2019-09-28 MED ORDER — PROPOFOL 500 MG/50ML IV EMUL
INTRAVENOUS | Status: DC | PRN
  Administered 2019-09-28: 50 ug/kg/min via INTRAVENOUS

## 2019-09-28 MED ORDER — BUPIVACAINE HCL (PF) 0.5 % IJ SOLN
INTRAMUSCULAR | Status: DC | PRN
  Administered 2019-09-28: 10 mL

## 2019-09-28 MED ORDER — SODIUM CHLORIDE 0.9 % IV SOLN
INTRAVENOUS | Status: DC
  Administered 2019-09-28: 1000 mL via INTRAVENOUS

## 2019-09-28 MED ORDER — CEFAZOLIN SODIUM-DEXTROSE 2-4 GM/100ML-% IV SOLN
2.0000 g | INTRAVENOUS | Status: AC
  Administered 2019-09-28: 2 g via INTRAVENOUS

## 2019-09-28 MED ORDER — FENTANYL CITRATE (PF) 100 MCG/2ML IJ SOLN
INTRAMUSCULAR | Status: DC | PRN
Start: 2019-09-28 — End: 2019-09-28
  Administered 2019-09-28 (×2): 25 ug via INTRAVENOUS

## 2019-09-28 MED ORDER — ONDANSETRON HCL 4 MG/2ML IJ SOLN
INTRAMUSCULAR | Status: AC
  Filled 2019-09-28: qty 2

## 2019-09-28 MED ORDER — CEFAZOLIN SODIUM-DEXTROSE 2-4 GM/100ML-% IV SOLN
INTRAVENOUS | Status: AC
  Filled 2019-09-28: qty 100

## 2019-09-28 MED ORDER — FENTANYL CITRATE (PF) 100 MCG/2ML IJ SOLN
INTRAMUSCULAR | Status: AC
  Filled 2019-09-28: qty 2

## 2019-09-28 MED ORDER — LIDOCAINE 2% (20 MG/ML) 5 ML SYRINGE
INTRAMUSCULAR | Status: AC
  Filled 2019-09-28: qty 5

## 2019-09-28 MED ORDER — FENTANYL CITRATE (PF) 100 MCG/2ML IJ SOLN: 25.0000 ug | INTRAMUSCULAR | Status: DC | PRN

## 2019-09-28 MED ORDER — ONDANSETRON HCL 4 MG/2ML IJ SOLN: 4.0000 mg | Freq: Once | INTRAMUSCULAR | Status: DC | PRN

## 2019-09-28 SURGICAL SUPPLY — 45 items
BLADE SURG 15 STRL LF DISP TIS (BLADE) ×1 IMPLANT
BLADE SURG 15 STRL SS (BLADE) ×6
BNDG ELASTIC 4X5.8 VLCR STR LF (GAUZE/BANDAGES/DRESSINGS) ×1 IMPLANT
BNDG ELASTIC 6X5.8 VLCR STR LF (GAUZE/BANDAGES/DRESSINGS) ×3 IMPLANT
CLEANER CAUTERY TIP 5X5 PAD (MISCELLANEOUS) ×1 IMPLANT
CNTNR URN SCR LID CUP LEK RST (MISCELLANEOUS) ×1 IMPLANT
CONT SPEC 4OZ STRL OR WHT (MISCELLANEOUS) ×3
COVER MAYO STAND STRL (DRAPES) ×3 IMPLANT
COVER SURGICAL LIGHT HANDLE (MISCELLANEOUS) ×3 IMPLANT
COVER WAND RF STERILE (DRAPES) IMPLANT
CUFF TOURN SGL QUICK 24 (TOURNIQUET CUFF) ×3
CUFF TRNQT CYL 24X4X16.5-23 (TOURNIQUET CUFF) ×1 IMPLANT
DECANTER SPIKE VIAL GLASS SM (MISCELLANEOUS) ×1 IMPLANT
DRAPE 3/4 80X56 (DRAPES) ×2 IMPLANT
GAUZE SPONGE 4X4 12PLY STRL (GAUZE/BANDAGES/DRESSINGS) ×5 IMPLANT
GAUZE SPONGE 4X4 12PLY STRL LF (GAUZE/BANDAGES/DRESSINGS) ×2 IMPLANT
GAUZE XEROFORM 1X8 LF (GAUZE/BANDAGES/DRESSINGS) ×2 IMPLANT
GLOVE BIO SURGEON STRL SZ7 (GLOVE) ×2 IMPLANT
GLOVE BIO SURGEON STRL SZ7.5 (GLOVE) ×3 IMPLANT
GLOVE BIOGEL PI IND STRL 8 (GLOVE) ×1 IMPLANT
GLOVE BIOGEL PI INDICATOR 8 (GLOVE) ×2
GOWN STRL REUS W/TWL LRG LVL3 (GOWN DISPOSABLE) ×2 IMPLANT
GOWN STRL REUS W/TWL XL LVL3 (GOWN DISPOSABLE) ×3 IMPLANT
KIT BASIN OR (CUSTOM PROCEDURE TRAY) ×3 IMPLANT
KIT TURNOVER CYSTO (KITS) ×3 IMPLANT
MARKER SKIN DUAL TIP RULER LAB (MISCELLANEOUS) ×3 IMPLANT
NDL HYPO 25X1 1.5 SAFETY (NEEDLE) ×1 IMPLANT
NEEDLE HYPO 25X1 1.5 SAFETY (NEEDLE) ×3 IMPLANT
PACK ORTHO EXTREMITY (CUSTOM PROCEDURE TRAY) ×3 IMPLANT
PAD CAST 3X4 CTTN HI CHSV (CAST SUPPLIES) IMPLANT
PAD CLEANER CAUTERY TIP 5X5 (MISCELLANEOUS)
PADDING CAST COTTON 3X4 STRL (CAST SUPPLIES) ×3
PADDING UNDERCAST 2 STRL (CAST SUPPLIES) ×2
PADDING UNDERCAST 2X4 STRL (CAST SUPPLIES) ×1 IMPLANT
PENCIL SMOKE EVACUATOR (MISCELLANEOUS) ×2 IMPLANT
SUT ETHILON 4 0 PS 2 18 (SUTURE) ×2 IMPLANT
SUT PROLENE 4 0 PS 2 18 (SUTURE) ×3 IMPLANT
SUT VIC AB 1 CT1 27 (SUTURE) ×3
SUT VIC AB 1 CT1 27XBRD ANTBC (SUTURE) ×1 IMPLANT
SUT VIC AB 4-0 PS2 27 (SUTURE) ×2 IMPLANT
SYR 20ML LL LF (SYRINGE) ×3 IMPLANT
TOWEL OR 17X26 10 PK STRL BLUE (TOWEL DISPOSABLE) ×3 IMPLANT
TOWEL OR NON WOVEN STRL DISP B (DISPOSABLE) ×3 IMPLANT
TRAY DSU PREP LF (CUSTOM PROCEDURE TRAY) ×3 IMPLANT
UNDERPAD 30X36 HEAVY ABSORB (UNDERPADS AND DIAPERS) ×8 IMPLANT

## 2019-09-28 NOTE — Transfer of Care (Signed)
Immediate Anesthesia Transfer of Care Note  Patient: [name]  Procedure(s) Performed: Right 2nd toe amputation - partial (Right Toe)  Patient Location: PACU  Anesthesia Type:MAC  Level of Consciousness: awake, alert , oriented and patient cooperative  Airway & Oxygen Therapy: Patient Spontanous Breathing and Patient connected to face mask oxygen  Post-op Assessment: Report given to RN and Post -op Vital signs reviewed and stable  Post vital signs: Reviewed and stable  Last Vitals:  Vitals Value Taken Time  BP 152/73 09/28/19 1318  Temp    Pulse 81 09/28/19 1319  Resp 15 09/28/19 1319  SpO2 100 % 09/28/19 1319  Vitals shown include unvalidated device data.  Last Pain:  Vitals:   09/28/19 1044  TempSrc: Oral  PainSc: 0-No pain      Patients Stated Pain Goal: 5 (97/35/32 9924)  Complications: No complications documented.

## 2019-09-28 NOTE — Discharge Instructions (Signed)
  After Surgery Instructions   1) If you are recuperating from surgery anywhere other than home, please be sure to leave [place] the number where you can be reached.  2) Go directly home and rest.  3) Keep the operated foot(feet) elevated six inches above the hip when sitting or lying down. This will help control swelling and pain.  4) Support the elevated foot and leg with pillows. DO NOT PLACE PILLOWS UNDER THE KNEE.  5) DO NOT REMOVE or get your bandages WET, unless you were given different instructions by your doctor to do so. This increases the risk of infection.  6) Wear your surgical shoe or surgical boot at all times when you are up on your feet.  7) A limited amount of pain and swelling may occur. The skin may take on a bruised appearance. DO NOT BE ALARMED, THIS IS NORMAL.  8) For slight pain and swelling, apply an ice pack directly over the bandages for 15 minutes only out of each hour of the day. Continue until seen in the office for your first post op visit. DO NOT APPLY ANY FORM OF HEAT TO THE AREA.  9) Have prescriptions filled immediately and take as directed.  10) Drink lots of liquids, water and juice to stay hydrated.  11) CALL IMMEDIATELY IF:  *Bleeding continues until the following day of surgery  *Pain increases and/or does not respond to medication  *Bandages or cast appears to tight  *If your bandage gets wet  *Trip, fall or stump your surgical foot  *If your temperature goes above 101  *If you have ANY questions at all  12) You are expected to be weightbearing after your surgery.   If you need to reach the nurse for any reason, please call: [place]: [phone] [place]: [phone] [place]: [phone]   Post Anesthesia Home Care Instructions  Activity: Get plenty of rest for the remainder of the day. A responsible adult should stay with you for 24 hours following the procedure.  For the next 24 hours, DO NOT: -Drive a  car -Paediatric nurse -Drink alcoholic beverages -Take any medication unless instructed by your physician -Make any legal decisions or sign important papers.  Meals: Start with liquid foods such as gelatin or soup. Progress to regular foods as tolerated. Avoid greasy, spicy, heavy foods. If nausea and/or vomiting occur, drink only clear liquids until the nausea and/or vomiting subsides. Call your physician if vomiting continues.  Special Instructions/Symptoms: Your throat may feel dry or sore from the anesthesia or the breathing tube placed in your throat during surgery. If this causes discomfort, gargle with warm salt water. The discomfort should disappear within 24 hours.  If you had a scopolamine patch placed behind your ear for the management of post- operative nausea and/or vomiting:  1. The medication in the patch is effective for 72 hours, after which it should be removed.  Wrap patch in a tissue and discard in the trash. Wash hands thoroughly with soap and water. 2. You may remove the patch earlier than 72 hours if you experience unpleasant side effects which may include dry mouth, dizziness or visual disturbances. 3. Avoid touching the patch. Wash your hands with soap and water after contact with the patch.

## 2019-09-28 NOTE — H&P (Signed)
  Subjective:  Patient ID: Sue [name], female    DOB: February 09, 1932,  MRN: [id number]  86 y.o. female presents for planned surgery. Examined in pre-op denies changes since last seen.  Objective:  Physical Exam: warm, good capillary refill, no trophic changes or ulcerative lesions, normal DP and PT pulses and normal sensory exam. Right Foot: right 2nd toe ulcer with chronic slight erythema, edema with distal tip ulceration  No images are attached to the encounter.  MRI 8/15 IMPRESSION: Findings which could be suggestive early osteomyelitis involving the plantar surface of the distal second phalanx. Overlying cellulitis and tissue edema. No sinus tract or loculated fluid collections.  Assessment:   1. Pre-op exam   2. Ulcer of toe, right, with necrosis of muscle (HCC)   3. Pain in right foot   4. Other chronic osteomyelitis of right foot ([place])   5. Diabetic peripheral neuropathy associated with type 2 diabetes mellitus ([place])    Plan:  Patient was evaluated and treated and all questions answered.  Osteomyelitis right 2nd toe -Proceed with procedure today. -Consent signed. RLE marked -All questions answered    No follow-ups on file.

## 2019-09-28 NOTE — Progress Notes (Signed)
  Subjective:  Patient ID: Sue [name], female    DOB: 05-06-1932,  MRN: [id number]  Chief Complaint  Patient presents with  . Wound Check    Denies fever/nausea/vomiting/chills. Pt admits high glucose last night of 451. Glucose today 170.   86 y.o. female presents with the above complaint. History confirmed with patient.   Objective:  Physical Exam: warm, good capillary refill, no trophic changes or ulcerative lesions, normal DP and PT pulses and normal sensory exam. Right Foot: right 2nd toe ulcer with chronic slight erythema, edema with distal tip ulceration  No images are attached to the encounter.  MRI 8/15 IMPRESSION: Findings which could be suggestive early osteomyelitis involving the plantar surface of the distal second phalanx. Overlying cellulitis and tissue edema. No sinus tract or loculated fluid collections.  Assessment:   1. Pre-op exam   2. Ulcer of toe, right, with necrosis of muscle (HCC)   3. Pain in right foot   4. Other chronic osteomyelitis of right foot (Sue [name])   5. Diabetic peripheral neuropathy associated with type 2 diabetes mellitus (Sue [name])      Plan:  Patient was evaluated and treated and all questions answered.  Osteomyelitis right 2nd toe -MRI reviewed with patient and daughter -Discussed treatment options at length. Discussed consequences of no intervention. -Patient has failed all conservative therapy and wishes to proceed with surgical intervention. All risks, benefits, and alternatives discussed with patient. No guarantees given. Consent reviewed and signed by patient. 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

## 2019-09-28 NOTE — Op Note (Signed)
  Patient Name: Sue [name] DOB: 1932-08-31  MRN: [id number]   Date of Surgery: 09/28/2019  Surgeon: Dr. Hardie Pulley, DPM Assistants: none  Pre-operative Diagnosis:  Ostoemyelitis Post-operative Diagnosis:  same Procedures:  1) Partial amputation right 2nd toe Pathology/Specimens: ID Type Source Tests Collected by Time Destination  1 : right distal second toe Tissue PATH Soft tissue SURGICAL PATHOLOGY Evelina Bucy, DPM 09/28/2019 1223    Anesthesia: MAC/local Hemostasis: * No tourniquets in log * Estimated Blood Loss: 10 mL Materials: * No implants in log * Medications: 1g Vancomycin topical Complications: none  Indications for Procedure:  This is a 86 y.o. female with ostoemyelitis of the right 2nd toe presenting for amputation for surgical cure of infection   Procedure in Detail: Patient was identified in pre-operative holding area. Formal consent was signed and the right lower extremity was marked. Patient was brought back to the operating room. Anesthesia was induced. The extremity was prepped and draped in the usual sterile fashion. Timeout was taken to confirm patient name, laterality, and procedure prior to incision.   Attention was then directed to the right second toe where an incision was made at the DIPJ. Dissection was carried down to level of bone.  Dissection was continued to the distal interphalangeal joint and all collateral ligaments were freed at the joint.  The bone soft tissue attachments of the distal phalanx were removed and passed for pathology.  The remaining middle phalangeal head appeared healthy and viable.  The area was copiously irrigated.  The deep tissue was closed with 4-0 vicryl and the skin was reapproximated with nylon.  The foot was then dressed with xeroform, 4x4, kerlix, ACE bandage. Patient tolerated the procedure well.   Disposition: Following a period of post-operative monitoring, patient will be transferred home.

## 2019-10-02 LAB — SURGICAL PATHOLOGY

## 2019-10-02 NOTE — Anesthesia Postprocedure Evaluation (Signed)
Anesthesia Post Note  Patient: Sue [name]  Procedure(s) Performed: Right 2nd toe amputation - partial (Right Toe)     Patient location during evaluation: PACU Anesthesia Type: MAC Level of consciousness: awake and alert Pain management: pain level controlled Vital Signs Assessment: post-procedure vital signs reviewed and stable Respiratory status: spontaneous breathing, nonlabored ventilation, respiratory function stable and patient connected to nasal cannula oxygen Cardiovascular status: stable and blood pressure returned to baseline Postop Assessment: no apparent nausea or vomiting Anesthetic complications: no   No complications documented.  Last Vitals:  Vitals:   09/28/19 1400 09/28/19 1500  BP: (!) 155/70 (!) 156/72  Pulse: 75 73  Resp: 12 12  Temp:  (!) 36.4 C  SpO2: 96% 97%    Last Pain:  Vitals:   09/28/19 1500  TempSrc: Oral  PainSc: 0-No pain                 [place]

## 2019-10-03 ENCOUNTER — Encounter (HOSPITAL_BASED_OUTPATIENT_CLINIC_OR_DEPARTMENT_OTHER): Payer: Self-pay | Admitting: Podiatry

## 2019-10-05 ENCOUNTER — Other Ambulatory Visit: Payer: Self-pay

## 2019-10-05 ENCOUNTER — Ambulatory Visit (INDEPENDENT_AMBULATORY_CARE_PROVIDER_SITE_OTHER): Payer: Medicare Other | Admitting: Podiatry

## 2019-10-05 DIAGNOSIS — Z9889 Other specified postprocedural states: Secondary | ICD-10-CM

## 2019-10-05 DIAGNOSIS — M86671 Other chronic osteomyelitis, right ankle and foot: Secondary | ICD-10-CM

## 2019-10-12 ENCOUNTER — Ambulatory Visit: Payer: Medicare Other | Admitting: Podiatry

## 2019-10-12 ENCOUNTER — Other Ambulatory Visit: Payer: Self-pay

## 2019-10-12 DIAGNOSIS — M86671 Other chronic osteomyelitis, right ankle and foot: Secondary | ICD-10-CM

## 2019-10-12 DIAGNOSIS — Z9889 Other specified postprocedural states: Secondary | ICD-10-CM

## 2019-10-15 NOTE — Progress Notes (Signed)
  Subjective:  Patient ID: Sue [name], female    DOB: 02-Oct-1932,  MRN: [id number]  Chief Complaint  Patient presents with  . Routine Post Op    POV #1 DOS 09/28/19 TOE AMPUTATION IPJ 2ND RT. Pt doesnt have any concerns     DOS: 9/31/21 Procedure: Partial amputation right 2nd toe   86 y.o. female presents with the above complaint. History confirmed with patient.   Objective:  Physical Exam: tenderness at the surgical site, no edema noted and calf supple, nontender. Incision: healing well, no significant drainage, no dehiscence, no significant erythema  Assessment:   1. Other chronic osteomyelitis of right foot ([place])   2. Post-operative state     Plan:  Patient was evaluated and treated and all questions answered.  Post-operative State -Dressing applied consisting of sterile gauze, kerlix and ACE bandage -WBAT in Surgical shoe -Consider suture removal next visit.  No follow-ups on file.

## 2019-10-15 NOTE — Progress Notes (Signed)
  Subjective:  Patient ID: Hiram Gash, female    DOB: [birth year],  MRN: [id number]  No chief complaint on file.  DOS: 9/31/21 Procedure: Partial amputation right 2nd toe   86 y.o. female presents with the above complaint. Daughter states the foot is doing well. No discomfort. Has been doing well.  Objective:  Physical Exam: tenderness at the surgical site, no edema noted and calf supple, nontender. Incision: healing well, no significant drainage, no dehiscence, no significant erythema  Assessment:   1. Other chronic osteomyelitis of right foot ([place])   2. Post-operative state     Plan:  Patient was evaluated and treated and all questions answered.  Post-operative State -Dressing applied consisting of sterile gauze, kerlix and ACE bandage -WBAT in Surgical shoe -Consider suture removal next visit.  No follow-ups on file.

## 2019-10-23 DIAGNOSIS — E1121 Type 2 diabetes mellitus with diabetic nephropathy: Secondary | ICD-10-CM | POA: Diagnosis not present

## 2019-10-23 DIAGNOSIS — E782 Mixed hyperlipidemia: Secondary | ICD-10-CM | POA: Diagnosis not present

## 2019-10-23 DIAGNOSIS — I129 Hypertensive chronic kidney disease with stage 1 through stage 4 chronic kidney disease, or unspecified chronic kidney disease: Secondary | ICD-10-CM | POA: Diagnosis not present

## 2019-10-23 DIAGNOSIS — E1165 Type 2 diabetes mellitus with hyperglycemia: Secondary | ICD-10-CM | POA: Diagnosis not present

## 2019-10-23 DIAGNOSIS — N183 Chronic kidney disease, stage 3 unspecified: Secondary | ICD-10-CM | POA: Diagnosis not present

## 2019-10-23 DIAGNOSIS — Z23 Encounter for immunization: Secondary | ICD-10-CM | POA: Diagnosis not present

## 2019-10-23 NOTE — Progress Notes (Signed)
[facility] Note  10/24/2019   CHIEF COMPLAINT Patient presents for Retina Follow Up  HISTORY OF PRESENT ILLNESS: Sue [name] is a 86 y.o. female who presents to the clinic today for:   HPI    Retina Follow Up    Patient presents with  Wet AMD.  In left eye.  This started weeks ago.  Severity is moderate.  Duration of weeks.  Since onset it is stable.  I, the attending physician,  performed the HPI with the patient and updated documentation appropriately.          Comments    Pt states vision is about the same.  Denies eye pain or discomfort and denies any new or worsening floaters or fol OU.       Last edited by Bernarda Caffey, MD on 10/25/2019  1:56 AM. (History)    pt states vision is the same  Referring physician: Merrilee Seashore, [address] [place],  [place] [address]  HISTORICAL INFORMATION:   Selected notes from the MEDICAL RECORD NUMBER Referred by Dr. Quentin Ore for concern of ARMD OU    CURRENT MEDICATIONS: Current Outpatient Medications (Ophthalmic Drugs)  Medication Sig  . Polyethyl Glycol-Propyl Glycol (SYSTANE) 0.4-0.3 % SOLN Place 1 drop into both eyes 3 (three) times daily as needed (dry eyes).  . RESTASIS 0.05 % ophthalmic emulsion Place 1 drop into both eyes 2 (two) times daily.    No current facility-administered medications for this visit. (Ophthalmic Drugs)   Current Outpatient Medications (Other)  Medication Sig  . amLODipine (NORVASC) 10 MG tablet Take 10 mg by mouth every morning.   . B-D ULTRAFINE III SHORT PEN 31G X 8 MM MISC USE AS DIRECTED ONCE DAILY SUBCUTANEOUSLY  . calcium-vitamin D (OSCAL WITH D) 500-200 MG-UNIT tablet Take 1 tablet by mouth 2 (two) times daily.   . cephALEXin (KEFLEX) 500 MG capsule Take 1 capsule (500 mg total) by mouth 2 (two) times daily.  . diphenhydramine-acetaminophen (TYLENOL PM) 25-500 MG TABS tablet Take 1 tablet by mouth at bedtime as needed (sleep).  Marland Kitchen  glucose blood test strip OneTouch Ultra Test strips  TEST ONCE A DAY ONCE A DAY FINGERSTICK 90  . hydrALAZINE (APRESOLINE) 25 MG tablet Take 3 tablets (75 mg total) by mouth every 8 (eight) hours.  . hydrochlorothiazide (HYDRODIURIL) 25 MG tablet Take 25 mg by mouth daily.  Marland Kitchen HYDROcodone-acetaminophen (NORCO/VICODIN) 5-325 MG tablet Take 1 tablet by mouth every 4 (four) hours as needed for moderate pain.  Marland Kitchen ibuprofen (ADVIL) 200 MG tablet Take 400 mg by mouth every 6 (six) hours as needed for moderate pain.  . Insulin Degludec (TRESIBA FLEXTOUCH) 200 UNIT/ML SOPN Inject 36 Units into the skin at bedtime. (Patient taking differently: Inject 22 Units into the skin at bedtime. )  . Insulin Pen Needle (PEN NEEDLES) 32G X 4 MM MISC BD Ultra-Fine Nano Pen Needle 32 gauge x 5/32"  USE AS DIRECTED DAILY  . JANUVIA 25 MG tablet Take 25 mg by mouth daily.  . Lancets (ONETOUCH ULTRASOFT) lancets OneTouch UltraSoft Lancets  USE TWICE DAILY  . Melatonin 10 MG CAPS Take 10 mg by mouth at bedtime as needed (sleep).  . meloxicam (MOBIC) 7.5 MG tablet Take 1 tablet (7.5 mg total) by mouth daily as needed for pain. (Patient not taking: Reported on 09/18/2019)  . Multiple Vitamins-Minerals (ADULT ONE DAILY GUMMIES) CHEW Chew 1 tablet by mouth 2 (two) times daily. Centrum   .  Multiple Vitamins-Minerals (AIRBORNE) TBEF Take 1 tablet by mouth daily as needed (immune support).   . Multiple Vitamins-Minerals (PRESERVISION AREDS 2) CAPS Take 1 capsule by mouth 2 (two) times daily.  . mupirocin ointment (BACTROBAN) 2 % Apply to right foot ulcer once daily. (Patient taking differently: Apply 1 application topically daily as needed (wound care). Apply to right foot ulcer once daily.)  . nortriptyline (PAMELOR) 25 MG capsule Take 1 capsule (25 mg total) by mouth at bedtime. (Patient taking differently: Take 25 mg by mouth 2 (two) times daily. )  . Omega-3 Fatty Acids (FISH OIL) 1200 MG CAPS Take 1,200 mg by mouth daily with  lunch.   Marland Kitchen omeprazole (PRILOSEC) 20 MG capsule Take 20 mg by mouth every morning.  . ONE TOUCH ULTRA TEST test strip   . PRESCRIPTION MEDICATION by Intravitreal route every 30 (thirty) days. Ophthalmic injection at MD office in left eye  . silver sulfADIAZINE (SILVADENE) 1 % cream Apply pea-sized amount to wound daily. (Patient taking differently: Apply 1 application topically daily. Apply pea-sized amount to wound daily.)  . simvastatin (ZOCOR) 20 MG tablet Take 20 mg by mouth at bedtime.   . traMADol (ULTRAM) 50 MG tablet Take 1 tablet (50 mg total) by mouth 2 (two) times daily as needed. (Patient not taking: Reported on 09/18/2019)  . vitamin B-12 (CYANOCOBALAMIN) 1000 MCG tablet Take 1,000 mcg by mouth 2 (two) times daily.    Current Facility-Administered Medications (Other)  Medication Route  . Bevacizumab (AVASTIN) SOLN 1.25 mg Intravitreal  . Bevacizumab (AVASTIN) SOLN 1.25 mg Intravitreal      REVIEW OF SYSTEMS: ROS    Positive for: Gastrointestinal, Genitourinary, Endocrine, Eyes   Negative for: Constitutional, Neurological, Skin, Musculoskeletal, HENT, Cardiovascular, Respiratory, Psychiatric, Allergic/Imm, Heme/Lymph   Last edited by Doneen Poisson on 10/24/2019 10:07 AM. (History)       ALLERGIES No Known Allergies  PAST MEDICAL HISTORY Past Medical History:  Diagnosis Date  . Anemia   . Arthritis   . Basal cell carcinoma 11/23/2017   left elbow, right neck TX CX3 5FU   . Basal cell carcinoma 04/04/2019   infil-right sideburn (CX35FU)  . Chronic kidney disease    stage 3 ckd stage 3 b per dr Ashby Dawes 09-26-2019 note on chart  . Complication of anesthesia    deifficulty waking up  . COVID-19   . Diabetic neuropathy ([place])   . Hyperlipidemia   . Hypertension   . Hypertensive retinopathy    OU  . Macular degeneration    OU  . Pneumonia   . Right carotid bruit per dr Ashby Dawes 09-26-2019 note on chart  . SCC (squamous cell carcinoma) 11/23/2017   right  forehead TX CX3 5FU  . Type 2 DM with CKD stage 3 and hypertension ([place])    Past Surgical History:  Procedure Laterality Date  . ABDOMINAL HYSTERECTOMY    . AMPUTATION TOE Right 09/28/2019   Procedure: Right 2nd toe amputation - partial;  Surgeon: Evelina Bucy, DPM;  Location: [facility];  Service: Podiatry;  Laterality: Right;  . CATARACT EXTRACTION    . CHOLECYSTECTOMY    . EYE SURGERY    . HERNIA REPAIR    . SPINE SURGERY    . TONSILLECTOMY      FAMILY HISTORY History reviewed. No pertinent family history.  SOCIAL HISTORY Social History   Tobacco Use  . Smoking status: Former Research scientist (life sciences)  . Smokeless tobacco: Never Used  Vaping Use  . Vaping  Use: Never used  Substance Use Topics  . Alcohol use: No    Alcohol/week: 0.0 standard drinks  . Drug use: No         OPHTHALMIC EXAM:  Base Eye Exam    Visual Acuity (Snellen - Linear)      Right Left   Dist cc 20/30 -2 20/40 -2   Dist ph cc NI NI   Correction: Glasses       Tonometry (Tonopen, 10:11 AM)      Right Left   Pressure 13 11       Pupils      Dark Light Shape React APD   Right 3 2 Round Brisk 0   Left 3 2 Round Brisk 0       Visual Fields      Left Right    Full Full       Extraocular Movement      Right Left    Full Full       Neuro/Psych    Oriented x3: Yes   Mood/Affect: Normal       Dilation    Both eyes: 1.0% Mydriacyl, 2.5% Phenylephrine @ 10:11 AM        Slit Lamp and Fundus Exam    Slit Lamp Exam      Right Left   Lids/Lashes Dermatochalasis - upper lid, Telangiectasia, mild MGD Dermatochalasis - upper lid, Telangiectasia, Meibomian gland dysfunction   Conjunctiva/Sclera White and quiet White and quiet   Cornea Temporal Well healed cataract wounds, mild Arcus, trace endo pigment nasally, 1+PEE Arcus, Temporal Well healed cataract wounds, 1-2+ PEE   Anterior Chamber Deep and quiet Deep and quiet   Iris Round and moderately dilated to 4.33mm Round and moderately  dilated to 18mm, Iris atrophy from 0100 to 0230, peripheral irodotimy at 0200   Lens 3-piece PC IOL in good position PC IOL in good position   Vitreous Vitreous syneresis, Posterior vitreous detachment, Weiss ring Vitreous syneresis, PVD, vitreous condensations       Fundus Exam      Right Left   Disc 360 Peripapillary atrophy, diffuse mild pallor, compact, sharp rim mild pallor, Peripapillary atrophy and pigmentation   C/D Ratio 0.2 0.3   Macula Blunted foveal reflex, +PED, RPE mottling and clumping, Drusen, No heme or edema Blunted foveal reflex, PED / +CNVM with stable resolution of low lying SRF, RPE mottling and clumping, Drusen, No heme or edema, persistent CWS inferior macula -- linear, slightly improved   Vessels Vascular attenuation Vascular attenuation   Periphery Attached, 360 Reticular degeneration Attached, 360 Reticular degeneration        Refraction    Wearing Rx      Sphere Cylinder Axis   Right -1.50 +0.75 180   Left -0.50 +0.75 180          IMAGING AND PROCEDURES  Imaging and Procedures for @TODAY @  OCT, Retina - OU - Both Eyes       Right Eye Quality was good. Central Foveal Thickness: 232. Progression has been stable. Findings include normal foveal contour, no IRF, no SRF, pigment epithelial detachment, outer retinal atrophy, retinal drusen  (persistent PED).   Left Eye Quality was good. Central Foveal Thickness: 255. Progression has been stable. Findings include no IRF, retinal drusen , pigment epithelial detachment, outer retinal atrophy, epiretinal membrane, abnormal foveal contour, no SRF (stable improvement in IRF overlying low lying PED).   Notes *Images captured and stored on drive  Diagnosis / Impression:  OD: Non-Exudative ARMD -- stable OS: Exudative ARMD --stable improvement in IRF overlying low lying PED   Clinical management:  See below  Abbreviations: NFP - Normal foveal profile. CME - cystoid macular edema. PED - pigment epithelial  detachment. IRF - intraretinal fluid. SRF - subretinal fluid. EZ - ellipsoid zone. ERM - epiretinal membrane. ORA - outer retinal atrophy. ORT - outer retinal tubulation. SRHM - subretinal hyper-reflective material         Intravitreal Injection, Pharmacologic Agent - OS - Left Eye       Time Out 10/24/2019. 11:08 AM. Confirmed correct patient, procedure, site, and patient consented.   Anesthesia Topical anesthesia was used. Anesthetic medications included Lidocaine 2%, Proparacaine 0.5%.   Procedure Preparation included 5% betadine to ocular surface, eyelid speculum. A (32g) needle was used.   Injection:  1.25 mg Bevacizumab (AVASTIN) SOLN   NDC: 70360-001-02, Lot: [id number], Expiration date: 12/02/2019   Route: Intravitreal, Site: Left Eye, Waste: 0.05 mL  Post-op Post injection exam found visual acuity of at least counting fingers. The patient tolerated the procedure well. There were no complications. The patient received written and verbal post procedure care education. Post injection medications were not given.                 ASSESSMENT/PLAN:    ICD-10-CM   1. Exudative age-related macular degeneration of left eye with active choroidal neovascularization (HCC)  H35.3221 Intravitreal Injection, Pharmacologic Agent - OS - Left Eye    Bevacizumab (AVASTIN) SOLN 1.25 mg    CANCELED: Intravitreal Injection, Pharmacologic Agent - OS - Left Eye  2. Retinal edema  H35.81 OCT, Retina - OU - Both Eyes  3. 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 Diabetes mellitus type 2 without retinopathy ([place])  E11.9   5. Essential hypertension  I10   6. Hypertensive retinopathy of both eyes  H35.033   7. Pseudophakia of both eyes  Z96.1    1,2. Exudative age-related macular degeneration, left eye  - conversion of OS from nonexudative to exudative ARMD prior to 02.05.20 visit  - s/p IVA OS #1 (02.05.20), #2 (03.04.20), #3 (04.30.20), #4  (06.02.20), #5 (07.14.20), #6 (9.1.20), #7 (10.27.20), #8 (12.23.20), #9 (02.03.21), #10 (03.17.21), #11 (04.28.21), #12 (06.09.21), #13 (08.04.21)  - **history of recurrent SRF with extension to 8 wks, noted on 12.23.20 -- stable today 9.30.21 at 8 wks**  - OCT shows stable resolution of SRF overlying PED at 8 wk interval today  - BCVA decreased to 20/40 from 20/30   - recommend IVA #14 OS today 09.29.21 -- maintenance  - pt wishes to be treated with IVA OS  - risks and benefits of intravitreal avastin discussed with patient  - informed consent obtained  - see procedure note  - Avastin informed consent obtained and scanned 02.03.21  - f/u in 8 wks for DFE, OCT, likely injection OS   3. Age related macular degeneration, non-exudative, right eye  - stable, former pt of Dr. Zigmund Daniel -- no significant change in OCT or exam  - The incidence, anatomy, and pathology of dry AMD, risk of progression, and the AREDS and AREDS 2 study including smoking risks discussed with patient.  - recommend amsler grid monitoring  4. Diabetes mellitus, type 2 without retinopathy  - The incidence, risk factors for progression, natural history and treatment options for diabetic retinopathy  were discussed with patient.    - The need for close monitoring of blood glucose,  blood pressure, and serum lipids, avoiding cigarette or any type of tobacco, and the need for long term follow up was also discussed with patient.  - f/u in 1 year  5,6. Hypertensive retinopathy OU  - discussed importance of tight BP control  - monitor  7. Pseudophakia OU  - s/p CE/IOL OU  - beautiful surgeries, doing well  - monitor  Ophthalmic Meds Ordered this visit:  Meds ordered this encounter  Medications  . Bevacizumab (AVASTIN) SOLN 1.25 mg   Return in about 8 weeks (around 12/19/2019) for f/u exu ARMD OS, DFE, OCT.  There are no Patient Instructions on file for this visit.   This document serves as a record of services  personally performed by Gardiner Sleeper, MD, PhD. It was created on their behalf by Roselee Nova, COMT. The creation of this record is the provider's dictation and/or activities during the visit.  Electronically signed by: Roselee Nova, COMT 10/25/19 1:58 AM   This document serves as a record of services personally performed by Gardiner Sleeper, MD, PhD. It was created on their behalf by San Jetty. Owens Shark, OA an ophthalmic technician. The creation of this record is the provider's dictation and/or activities during the visit.    Electronically signed by: San Jetty. Owens Shark, [place] 09.29.2021 1:58 AM  Gardiner Sleeper, M.D., Ph.D. Diseases & Surgery of the Retina and Vitreous [facility]  I have reviewed the above documentation for accuracy and completeness, and I agree with the above. Gardiner Sleeper, M.D., Ph.D. 10/25/19 2:00 AM   Abbreviations: M myopia (nearsighted); A astigmatism; H hyperopia (farsighted); P presbyopia; Mrx spectacle prescription;  CTL contact lenses; OD right eye; OS left eye; OU both eyes  XT exotropia; ET esotropia; PEK punctate epithelial keratitis; PEE punctate epithelial erosions; DES dry eye syndrome; MGD meibomian gland dysfunction; ATs artificial tears; PFAT's preservative free artificial tears; [place] nuclear sclerotic cataract; PSC posterior subcapsular cataract; ERM epi-retinal membrane; PVD posterior vitreous detachment; RD retinal detachment; DM diabetes mellitus; DR diabetic retinopathy; NPDR non-proliferative diabetic retinopathy; PDR proliferative diabetic retinopathy; CSME clinically significant macular edema; DME diabetic macular edema; dbh dot blot hemorrhages; CWS cotton wool spot; POAG primary open angle glaucoma; C/D cup-to-disc ratio; HVF humphrey visual field; GVF goldmann visual field; OCT optical coherence tomography; IOP intraocular pressure; BRVO Branch retinal vein occlusion; CRVO central retinal vein occlusion; CRAO central retinal artery  occlusion; BRAO branch retinal artery occlusion; RT retinal tear; SB scleral buckle; PPV pars plana vitrectomy; VH Vitreous hemorrhage; PRP panretinal laser photocoagulation; IVK intravitreal kenalog; VMT vitreomacular traction; MH Macular hole;  NVD neovascularization of the disc; NVE neovascularization elsewhere; AREDS age related eye disease study; ARMD age related macular degeneration; POAG primary open angle glaucoma; EBMD epithelial/anterior basement membrane dystrophy; ACIOL anterior chamber intraocular lens; IOL intraocular lens; PCIOL posterior chamber intraocular lens; Phaco/IOL phacoemulsification with intraocular lens placement; [place] photorefractive keratectomy; LASIK laser assisted in situ keratomileusis; HTN hypertension; DM diabetes mellitus; COPD chronic obstructive pulmonary disease

## 2019-10-24 ENCOUNTER — Other Ambulatory Visit: Payer: Self-pay

## 2019-10-24 ENCOUNTER — Ambulatory Visit (INDEPENDENT_AMBULATORY_CARE_PROVIDER_SITE_OTHER): Payer: Medicare Other | Admitting: Ophthalmology

## 2019-10-24 DIAGNOSIS — E119 Type 2 diabetes mellitus without complications: Secondary | ICD-10-CM

## 2019-10-24 DIAGNOSIS — H353221 Exudative age-related macular degeneration, left eye, with active choroidal neovascularization: Secondary | ICD-10-CM

## 2019-10-24 DIAGNOSIS — H3581 Retinal edema: Secondary | ICD-10-CM | POA: Diagnosis not present

## 2019-10-24 DIAGNOSIS — H353112 Nonexudative age-related macular degeneration, right eye, intermediate dry stage: Secondary | ICD-10-CM | POA: Diagnosis not present

## 2019-10-24 DIAGNOSIS — I10 Essential (primary) hypertension: Secondary | ICD-10-CM

## 2019-10-24 DIAGNOSIS — Z961 Presence of intraocular lens: Secondary | ICD-10-CM

## 2019-10-24 DIAGNOSIS — H35033 Hypertensive retinopathy, bilateral: Secondary | ICD-10-CM

## 2019-10-25 ENCOUNTER — Encounter (INDEPENDENT_AMBULATORY_CARE_PROVIDER_SITE_OTHER): Payer: Self-pay | Admitting: Ophthalmology

## 2019-10-25 DIAGNOSIS — H353221 Exudative age-related macular degeneration, left eye, with active choroidal neovascularization: Secondary | ICD-10-CM | POA: Diagnosis not present

## 2019-10-25 DIAGNOSIS — I10 Essential (primary) hypertension: Secondary | ICD-10-CM | POA: Diagnosis not present

## 2019-10-25 MED ORDER — BEVACIZUMAB CHEMO INJECTION 1.25MG/0.05ML SYRINGE FOR KALEIDOSCOPE
1.2500 mg | INTRAVITREAL | Status: AC | PRN
  Administered 2019-10-25: 1.25 mg via INTRAVITREAL

## 2019-10-26 ENCOUNTER — Ambulatory Visit (INDEPENDENT_AMBULATORY_CARE_PROVIDER_SITE_OTHER): Payer: Medicare Other | Admitting: Podiatry

## 2019-10-26 ENCOUNTER — Other Ambulatory Visit: Payer: Self-pay

## 2019-10-26 VITALS — Temp 97.5°F

## 2019-10-26 DIAGNOSIS — L97513 Non-pressure chronic ulcer of other part of right foot with necrosis of muscle: Secondary | ICD-10-CM

## 2019-10-26 DIAGNOSIS — M86671 Other chronic osteomyelitis, right ankle and foot: Secondary | ICD-10-CM

## 2019-10-26 NOTE — Progress Notes (Signed)
  Subjective:  Patient ID: Sue [name], female    DOB: 11-Feb-1932,  MRN: [id number]  Chief Complaint  Patient presents with  . Routine Post Op    POV#3 9.3.2021 Pt denies fever/chills/nausea/vomiting. There is warmth and some edema noted in right dorsal foot.    DOS: 9/31/21 Procedure: Partial amputation right 2nd toe   86 y.o. female presents with the above complaint. States the toe is doing well. Denies issues or concerns. Objective:  Physical Exam: tenderness at the surgical site, no edema noted and calf supple, nontender Incision: well healed. No distal HPK noted today  Assessment:   1. Other chronic osteomyelitis of right foot ([place])   2. Ulcer of toe, right, with necrosis of muscle ([place])    Plan:  Patient was evaluated and treated and all questions answered.  Post-operative State -Doing very well. Well healed. No signs of acute infection noted.  -Consider flexor tenotomy if HPK distal recurs  Return in about 1 month (around 11/26/2019) for wound check right foot, routine foot care.

## 2019-11-13 ENCOUNTER — Ambulatory Visit: Payer: Medicare Other | Admitting: Podiatry

## 2019-11-13 ENCOUNTER — Encounter: Payer: Self-pay | Admitting: Podiatry

## 2019-11-13 ENCOUNTER — Other Ambulatory Visit: Payer: Self-pay

## 2019-11-13 DIAGNOSIS — L84 Corns and callosities: Secondary | ICD-10-CM | POA: Diagnosis not present

## 2019-11-13 DIAGNOSIS — M79674 Pain in right toe(s): Secondary | ICD-10-CM

## 2019-11-13 DIAGNOSIS — B351 Tinea unguium: Secondary | ICD-10-CM | POA: Diagnosis not present

## 2019-11-13 DIAGNOSIS — E1142 Type 2 diabetes mellitus with diabetic polyneuropathy: Secondary | ICD-10-CM | POA: Diagnosis not present

## 2019-11-13 DIAGNOSIS — Z89421 Acquired absence of other right toe(s): Secondary | ICD-10-CM | POA: Diagnosis not present

## 2019-11-13 DIAGNOSIS — M79675 Pain in left toe(s): Secondary | ICD-10-CM | POA: Diagnosis not present

## 2019-11-18 ENCOUNTER — Encounter (HOSPITAL_BASED_OUTPATIENT_CLINIC_OR_DEPARTMENT_OTHER): Payer: Self-pay | Admitting: Emergency Medicine

## 2019-11-18 ENCOUNTER — Other Ambulatory Visit: Payer: Self-pay

## 2019-11-18 ENCOUNTER — Emergency Department (HOSPITAL_BASED_OUTPATIENT_CLINIC_OR_DEPARTMENT_OTHER)
Admission: EM | Admit: 2019-11-18 | Discharge: 2019-11-18 | Disposition: A | Discharge status: Home / Self Care | Payer: Medicare Other | Attending: Emergency Medicine | Admitting: Emergency Medicine

## 2019-11-18 ENCOUNTER — Emergency Department (HOSPITAL_BASED_OUTPATIENT_CLINIC_OR_DEPARTMENT_OTHER): Payer: Medicare Other

## 2019-11-18 DIAGNOSIS — M79661 Pain in right lower leg: Secondary | ICD-10-CM | POA: Diagnosis not present

## 2019-11-18 DIAGNOSIS — Z8616 Personal history of COVID-19: Secondary | ICD-10-CM | POA: Diagnosis not present

## 2019-11-18 DIAGNOSIS — E1140 Type 2 diabetes mellitus with diabetic neuropathy, unspecified: Secondary | ICD-10-CM | POA: Diagnosis not present

## 2019-11-18 DIAGNOSIS — E11621 Type 2 diabetes mellitus with foot ulcer: Secondary | ICD-10-CM | POA: Diagnosis not present

## 2019-11-18 DIAGNOSIS — I8391 Asymptomatic varicose veins of right lower extremity: Secondary | ICD-10-CM | POA: Insufficient documentation

## 2019-11-18 DIAGNOSIS — E1122 Type 2 diabetes mellitus with diabetic chronic kidney disease: Secondary | ICD-10-CM | POA: Insufficient documentation

## 2019-11-18 DIAGNOSIS — E11319 Type 2 diabetes mellitus with unspecified diabetic retinopathy without macular edema: Secondary | ICD-10-CM | POA: Diagnosis not present

## 2019-11-18 DIAGNOSIS — L97509 Non-pressure chronic ulcer of other part of unspecified foot with unspecified severity: Secondary | ICD-10-CM | POA: Diagnosis not present

## 2019-11-18 DIAGNOSIS — M79604 Pain in right leg: Secondary | ICD-10-CM | POA: Diagnosis not present

## 2019-11-18 DIAGNOSIS — M79671 Pain in right foot: Secondary | ICD-10-CM | POA: Diagnosis not present

## 2019-11-18 DIAGNOSIS — I129 Hypertensive chronic kidney disease with stage 1 through stage 4 chronic kidney disease, or unspecified chronic kidney disease: Secondary | ICD-10-CM | POA: Insufficient documentation

## 2019-11-18 DIAGNOSIS — E1169 Type 2 diabetes mellitus with other specified complication: Secondary | ICD-10-CM | POA: Diagnosis not present

## 2019-11-18 DIAGNOSIS — Z79899 Other long term (current) drug therapy: Secondary | ICD-10-CM | POA: Insufficient documentation

## 2019-11-18 DIAGNOSIS — N1832 Chronic kidney disease, stage 3b: Secondary | ICD-10-CM | POA: Diagnosis not present

## 2019-11-18 DIAGNOSIS — Z87891 Personal history of nicotine dependence: Secondary | ICD-10-CM | POA: Diagnosis not present

## 2019-11-18 DIAGNOSIS — Z85828 Personal history of other malignant neoplasm of skin: Secondary | ICD-10-CM | POA: Diagnosis not present

## 2019-11-18 DIAGNOSIS — E785 Hyperlipidemia, unspecified: Secondary | ICD-10-CM | POA: Insufficient documentation

## 2019-11-18 DIAGNOSIS — Z794 Long term (current) use of insulin: Secondary | ICD-10-CM | POA: Insufficient documentation

## 2019-11-18 NOTE — ED Triage Notes (Addendum)
R lower leg pain today. States she has veins that just popped up. She had surgery on her R toe last month. Sent from [facility] to r/o DVT

## 2019-11-18 NOTE — ED Provider Notes (Signed)
[facility] EMERGENCY DEPARTMENT Provider Note   CSN: [id number] Arrival date & time: 11/18/19  1454     History Chief Complaint  Patient presents with  . Leg Pain    Sue [name] is a 86 y.o. female.  She is complaining some pain in her right ankle and foot that has been intermittently going on for years.  It usually responds to Aleve.  It started again this morning and when she looked at her leg she noticed that her veins had "popped out."  She was concerned she had a blood clot.  She went to urgent care and they referred her here.  No calf pain or swelling no shortness of breath.  No fevers.  No trauma.  The history is provided by the patient.  Leg Pain Location:  Ankle and foot Injury: no   Ankle location:  R ankle Foot location:  R foot Pain details:    Quality:  Aching   Severity:  Moderate   Onset quality:  Gradual   Progression:  Unchanged Chronicity:  Recurrent Dislocation: no   Relieved by:  None tried Worsened by:  Bearing weight Ineffective treatments:  None tried Associated symptoms: no back pain, no fever, no muscle weakness, no numbness and no swelling        Past Medical History:  Diagnosis Date  . Anemia   . Arthritis   . Basal cell carcinoma 11/23/2017   left elbow, right neck TX CX3 5FU   . Basal cell carcinoma 04/04/2019   infil-right sideburn (CX35FU)  . Chronic kidney disease    stage 3 ckd stage 3 b per dr Ashby Dawes 09-26-2019 note on chart  . Complication of anesthesia    deifficulty waking up  . COVID-19   . Diabetic neuropathy ([place])   . Hyperlipidemia   . Hypertension   . Hypertensive retinopathy    OU  . Macular degeneration    OU  . Pneumonia   . Right carotid bruit per dr Ashby Dawes 09-26-2019 note on chart  . SCC (squamous cell carcinoma) 11/23/2017   right forehead TX CX3 5FU  . Type 2 DM with CKD stage 3 and hypertension [facility])     Patient Active Problem List   Diagnosis Date Noted  . Pyogenic inflammation  of bone ([place])   . Gastroenteritis due to COVID-19 virus 12/21/2018  . Acute renal failure superimposed on stage 3b chronic kidney disease ([place]) 12/21/2018  . Diabetes mellitus type 2, uncontrolled, with complications ([place]) 09/98/3382  . HLD (hyperlipidemia) 12/20/2018  . Anxiety 12/20/2018  . Depression 12/20/2018  . Acute respiratory disease due to COVID-19 virus 12/17/2018  . Dehydration 12/17/2018  . Essential hypertension 12/17/2018  . Type 1 diabetes mellitus without complication ([place]) 50/53/9767  . Diabetic ulcer of foot associated with diabetes mellitus due to underlying condition, limited to breakdown of skin ([place]) 01/11/2018  . Right hip pain 04/15/2016  . Right buttock pain 04/15/2016  . Primary osteoarthritis of right hip 04/15/2016    Past Surgical History:  Procedure Laterality Date  . ABDOMINAL HYSTERECTOMY    . AMPUTATION TOE Right 09/28/2019   Procedure: Right 2nd toe amputation - partial;  Surgeon: Evelina Bucy, DPM;  Location: [facility];  Service: Podiatry;  Laterality: Right;  . CATARACT EXTRACTION    . CHOLECYSTECTOMY    . EYE SURGERY    . HERNIA REPAIR    . SPINE SURGERY    . TONSILLECTOMY       OB  History   No obstetric history on file.     No family history on file.  Social History   Tobacco Use  . Smoking status: Former Research scientist (life sciences)  . Smokeless tobacco: Never Used  Vaping Use  . Vaping Use: Never used  Substance Use Topics  . Alcohol use: No    Alcohol/week: 0.0 standard drinks  . Drug use: No    Home Medications Prior to Admission medications   Medication Sig Start Date End Date Taking? Authorizing Provider  amLODipine (NORVASC) 10 MG tablet Take 10 mg by mouth every morning.  11/12/17   [provider]  B-D ULTRAFINE III SHORT PEN 31G X 8 MM MISC USE AS DIRECTED ONCE DAILY SUBCUTANEOUSLY 09/14/17   [provider]  calcium-vitamin D (OSCAL WITH D) 500-200 MG-UNIT tablet Take 1 tablet by mouth 2 (two) times  daily.     [provider]  cephALEXin (KEFLEX) 500 MG capsule Take 1 capsule (500 mg total) by mouth 2 (two) times daily. 09/28/19   Evelina Bucy, DPM  diphenhydramine-acetaminophen (TYLENOL PM) 25-500 MG TABS tablet Take 1 tablet by mouth at bedtime as needed (sleep).    [provider]  glucose blood test strip OneTouch Ultra Test strips  TEST ONCE A DAY ONCE A DAY FINGERSTICK 90    [provider]  hydrALAZINE (APRESOLINE) 25 MG tablet Take 3 tablets (75 mg total) by mouth every 8 (eight) hours. 12/23/18   Allie Bossier, MD  hydrochlorothiazide (HYDRODIURIL) 25 MG tablet Take 25 mg by mouth daily.    [provider]  HYDROcodone-acetaminophen (NORCO/VICODIN) 5-325 MG tablet Take 1 tablet by mouth every 4 (four) hours as needed for moderate pain. 09/28/19 09/27/20  Evelina Bucy, DPM  ibuprofen (ADVIL) 200 MG tablet Take 400 mg by mouth every 6 (six) hours as needed for moderate pain.    [provider]  Insulin Degludec (TRESIBA FLEXTOUCH) 200 UNIT/ML SOPN Inject 36 Units into the skin at bedtime. Patient taking differently: Inject 22 Units into the skin at bedtime.  12/23/18   Allie Bossier, MD  Insulin Pen Needle (PEN NEEDLES) 32G X 4 MM MISC BD Ultra-Fine Nano Pen Needle 32 gauge x 5/32"  USE AS DIRECTED DAILY    [provider]  JANUVIA 25 MG tablet Take 25 mg by mouth daily. 06/20/19   [provider]  Lancets (ONETOUCH ULTRASOFT) lancets OneTouch UltraSoft Lancets  USE TWICE DAILY    [provider]  Melatonin 10 MG CAPS Take 10 mg by mouth at bedtime as needed (sleep).    [provider]  meloxicam (MOBIC) 7.5 MG tablet Take 1 tablet (7.5 mg total) by mouth daily as needed for pain. Patient not taking: Reported on 09/18/2019 02/15/19   Hilts, Legrand Como, MD  Multiple Vitamins-Minerals (ADULT ONE DAILY GUMMIES) CHEW Chew 1 tablet by mouth 2 (two) times daily. Centrum     [provider]  Multiple  Vitamins-Minerals (AIRBORNE) TBEF Take 1 tablet by mouth daily as needed (immune support).     [provider]  Multiple Vitamins-Minerals (PRESERVISION AREDS 2) CAPS Take 1 capsule by mouth 2 (two) times daily.    [provider]  mupirocin ointment (BACTROBAN) 2 % Apply to right foot ulcer once daily. Patient taking differently: Apply 1 application topically daily as needed (wound care). Apply to right foot ulcer once daily. 06/26/19   Marzetta Board, DPM  nortriptyline (PAMELOR) 25 MG capsule Take 1 capsule (25 mg total) by  mouth at bedtime. Patient taking differently: Take 25 mg by mouth 2 (two) times daily.  12/23/18   Allie Bossier, MD  Omega-3 Fatty Acids (FISH OIL) 1200 MG CAPS Take 1,200 mg by mouth daily with lunch.     [provider]  omeprazole (PRILOSEC) 20 MG capsule Take 20 mg by mouth every morning.    [provider]  ONE TOUCH ULTRA TEST test strip  12/07/17   [provider]  Polyethyl Glycol-Propyl Glycol (SYSTANE) 0.4-0.3 % SOLN Place 1 drop into both eyes 3 (three) times daily as needed (dry eyes).    [provider]  PRESCRIPTION MEDICATION by Intravitreal route every 30 (thirty) days. Ophthalmic injection at MD office in left eye    [provider]  RESTASIS 0.05 % ophthalmic emulsion Place 1 drop into both eyes 2 (two) times daily.  04/03/19   [provider]  silver sulfADIAZINE (SILVADENE) 1 % cream Apply pea-sized amount to wound daily. Patient taking differently: Apply 1 application topically daily. Apply pea-sized amount to wound daily. 09/04/19   Evelina Bucy, DPM  simvastatin (ZOCOR) 20 MG tablet Take 20 mg by mouth at bedtime.     [provider]  traMADol (ULTRAM) 50 MG tablet Take 1 tablet (50 mg total) by mouth 2 (two) times daily as needed. Patient not taking: Reported on 09/18/2019 02/15/19   Hilts, Legrand Como, MD  vitamin B-12 (CYANOCOBALAMIN) 1000 MCG tablet Take 1,000 mcg by  mouth 2 (two) times daily.     [provider]    Allergies    Patient has no known allergies.  Review of Systems   Review of Systems  Constitutional: Negative for fever.  Eyes: Negative for redness.  Respiratory: Negative for cough.   Cardiovascular: Negative for chest pain.  Gastrointestinal: Negative for nausea and vomiting.  Genitourinary: Negative for dysuria.  Musculoskeletal: Negative for back pain.  Skin: Negative for wound.  Neurological: Negative for weakness and numbness.    Physical Exam Updated Vital Signs BP (!) 145/92 (BP Location: Left Arm)   Pulse 99   Temp 98.2 F (36.8 C) (Oral)   Resp 18   Ht 5\' 6"  (1.676 m)   Wt 76.2 kg   SpO2 98%   BMI 27.12 kg/m   Physical Exam Vitals and nursing note reviewed.  Constitutional:      General: She is not in acute distress.    Appearance: Normal appearance. She is well-developed.  HENT:     Head: Normocephalic and atraumatic.  Eyes:     Conjunctiva/sclera: Conjunctivae normal.  Cardiovascular:     Rate and Rhythm: Normal rate and regular rhythm.     Heart sounds: No murmur heard.   Pulmonary:     Effort: Pulmonary effort is normal. No respiratory distress.     Breath sounds: Normal breath sounds.  Abdominal:     Palpations: Abdomen is soft.     Tenderness: There is no abdominal tenderness.  Musculoskeletal:        General: No deformity or signs of injury.     Cervical back: Neck supple.     Comments: Right lower extremity full range of motion of hip knee ankle.  Moves digits easily.  She has some vague tenderness through her ankle and midfoot.  No color change or open wounds.  She does have prominent superficial veins of her anterior lower leg.  No cords or edema.  PT and DP pulse 2+.  Skin:    General: Skin  is warm and dry.     Capillary Refill: Capillary refill takes less than 2 seconds.  Neurological:     General: No focal deficit present.     Mental Status: She is alert.     ED Results /  Procedures / Treatments   Labs (all labs ordered are listed, but only abnormal results are displayed) Labs Reviewed - No data to display  EKG None  Radiology US Venous Img Lower Unilateral Right  Result Date: 11/18/2019 CLINICAL DATA:  86 year old female with RIGHT LOWER extremity pain. History of recent surgery. EXAM: RIGHT LOWER EXTREMITY VENOUS DOPPLER ULTRASOUND TECHNIQUE: Gray-scale sonography with compression, as well as color and duplex ultrasound, were performed to evaluate the deep venous system(s) from the level of the common femoral vein through the popliteal and proximal calf veins. COMPARISON:  None. FINDINGS: VENOUS Normal compressibility of the common femoral, superficial femoral, and popliteal veins, as well as the visualized calf veins. Visualized portions of profunda femoral vein and great saphenous vein unremarkable. No filling defects to suggest DVT on grayscale or color Doppler imaging. Doppler waveforms show normal direction of venous flow, normal respiratory plasticity and response to augmentation. Limited views of the contralateral common femoral vein are unremarkable. OTHER None. Limitations: none IMPRESSION: Negative. Electronically Signed   By: Margarette Canada M.D.   On: 11/18/2019 16:32    Procedures Procedures (including critical care time)  Medications Ordered in ED Medications - No data to display  ED Course  I have reviewed the triage vital signs and the nursing notes.  Pertinent labs & imaging results that were available during my care of the patient were reviewed by me and considered in my medical decision making (see chart for details).    MDM Rules/Calculators/A&P                         Patient here with intermittent right foot and ankle pain.  Today she had same along with noticing that her superficial varicosities were more pronounced.  She had a history of Covid remotely and knew that that increased her risk for having a blood clot so she came here to  get an ultrasound.  Ultrasound shows no evidence of DVT.  I do not see any other signs of infection.  Recommended she follow-up with her primary care doctor for further evaluation.  Return instructions discussed.  Final Clinical Impression(s) / ED Diagnoses Final diagnoses:  Right foot pain  Asymptomatic varicose veins of right lower extremity    Rx / DC Orders ED Discharge Orders    None       Hayden Rasmussen, MD 11/19/19 1104

## 2019-11-18 NOTE — Progress Notes (Signed)
Subjective:  Patient ID: Sue [name], female    DOB: October 24, 1932,  MRN: [id number]  86 y.o. female presents with at risk foot care. Patient has h/o amputation of partial amputation of R 2nd toe and callus(es) submet head 2 right foot and painful thick toenails that are difficult to trim. Painful toenails interfere with ambulation. Aggravating factors include wearing enclosed shoe gear. Pain is relieved with periodic professional debridement. Painful calluses are aggravated when weightbearing with and without shoegear. Pain is relieved with periodic professional debridement.  Patient's daughter is present during today's visit. They voice no new pedal concerns on today's visit. She did undergo partial digital amputation of right 2nd digit on September 28, 2019. Incision has healed.  PCP: Merrilee Seashore, MD and last visit was: 10/26/2019.  Review of Systems: Negative except as noted in the HPI.  Past Medical History:  Diagnosis Date  . Anemia   . Arthritis   . Basal cell carcinoma 11/23/2017   left elbow, right neck TX CX3 5FU   . Basal cell carcinoma 04/04/2019   infil-right sideburn (CX35FU)  . Chronic kidney disease    stage 3 ckd stage 3 b per dr Ashby Dawes 09-26-2019 note on chart  . Complication of anesthesia    deifficulty waking up  . COVID-19   . Diabetic neuropathy ([place])   . Hyperlipidemia   . Hypertension   . Hypertensive retinopathy    OU  . Macular degeneration    OU  . Pneumonia   . Right carotid bruit per dr Ashby Dawes 09-26-2019 note on chart  . SCC (squamous cell carcinoma) 11/23/2017   right forehead TX CX3 5FU  . 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 ABDOMINAL HYSTERECTOMY    . AMPUTATION TOE Right 09/28/2019   Procedure: Right 2nd toe amputation - partial;  Surgeon: Evelina Bucy, DPM;  Location: [facility];  Service: Podiatry;  Laterality: Right;  . CATARACT  EXTRACTION    . CHOLECYSTECTOMY    . EYE SURGERY    . HERNIA REPAIR    . SPINE SURGERY    . TONSILLECTOMY     Patient Active Problem List   Diagnosis Date Noted  . Pyogenic inflammation of bone ([place])   . Gastroenteritis due to COVID-19 virus 12/21/2018  . Acute renal failure superimposed on stage 3b chronic kidney disease ([place]) 12/21/2018  . Diabetes mellitus type 2, uncontrolled, with complications ([place]) 09/98/3382  . HLD (hyperlipidemia) 12/20/2018  . Anxiety 12/20/2018  . Depression 12/20/2018  . Acute respiratory disease due to COVID-19 virus 12/17/2018  . Dehydration 12/17/2018  . Essential hypertension 12/17/2018  . Type 1 diabetes mellitus without complication ([place]) 50/53/9767  . Diabetic ulcer of foot associated with diabetes mellitus due to underlying condition, limited to breakdown of skin ([place]) 01/11/2018  . Right hip pain 04/15/2016  . Right buttock pain 04/15/2016  . Primary osteoarthritis of right hip 04/15/2016    Current Outpatient Medications:  .  amLODipine (NORVASC) 10 MG tablet, Take 10 mg by mouth every morning. , Disp: , Rfl:  .  B-D ULTRAFINE III SHORT PEN 31G X 8 MM MISC, USE AS DIRECTED ONCE DAILY SUBCUTANEOUSLY, Disp: , Rfl: 11 .  calcium-vitamin D (OSCAL WITH D) 500-200 MG-UNIT tablet, Take 1 tablet by mouth 2 (two) times daily. , Disp: , Rfl:  .  cephALEXin (KEFLEX) 500 MG capsule, Take 1 capsule (500 mg total) by mouth  2 (two) times daily., Disp: 14 capsule, Rfl: 0 .  diphenhydramine-acetaminophen (TYLENOL PM) 25-500 MG TABS tablet, Take 1 tablet by mouth at bedtime as needed (sleep)., Disp: , Rfl:  .  glucose blood test strip, OneTouch Ultra Test strips  TEST ONCE A DAY ONCE A DAY FINGERSTICK 90, Disp: , Rfl:  .  hydrALAZINE (APRESOLINE) 25 MG tablet, Take 3 tablets (75 mg total) by mouth every 8 (eight) hours., Disp: 270 tablet, Rfl: 0 .  hydrochlorothiazide (HYDRODIURIL) 25 MG tablet, Take 25 mg by mouth daily., Disp: , Rfl:  .   HYDROcodone-acetaminophen (NORCO/VICODIN) 5-325 MG tablet, Take 1 tablet by mouth every 4 (four) hours as needed for moderate pain., Disp: 20 tablet, Rfl: 0 .  ibuprofen (ADVIL) 200 MG tablet, Take 400 mg by mouth every 6 (six) hours as needed for moderate pain., Disp: , Rfl:  .  Insulin Degludec (TRESIBA FLEXTOUCH) 200 UNIT/ML SOPN, Inject 36 Units into the skin at bedtime. (Patient taking differently: Inject 22 Units into the skin at bedtime. ), Disp: 3 pen, Rfl: 0 .  Insulin Pen Needle (PEN NEEDLES) 32G X 4 MM MISC, BD Ultra-Fine Nano Pen Needle 32 gauge x 5/32"  USE AS DIRECTED DAILY, Disp: , Rfl:  .  JANUVIA 25 MG tablet, Take 25 mg by mouth daily., Disp: , Rfl:  .  Lancets (ONETOUCH ULTRASOFT) lancets, OneTouch UltraSoft Lancets  USE TWICE DAILY, Disp: , Rfl:  .  Melatonin 10 MG CAPS, Take 10 mg by mouth at bedtime as needed (sleep)., Disp: , Rfl:  .  meloxicam (MOBIC) 7.5 MG tablet, Take 1 tablet (7.5 mg total) by mouth daily as needed for pain. (Patient not taking: Reported on 09/18/2019), Disp: 30 tablet, Rfl: 3 .  Multiple Vitamins-Minerals (ADULT ONE DAILY GUMMIES) CHEW, Chew 1 tablet by mouth 2 (two) times daily. Centrum , Disp: , Rfl:  .  Multiple Vitamins-Minerals (AIRBORNE) TBEF, Take 1 tablet by mouth daily as needed (immune support). , Disp: , Rfl:  .  Multiple Vitamins-Minerals (PRESERVISION AREDS 2) CAPS, Take 1 capsule by mouth 2 (two) times daily., Disp: , Rfl:  .  mupirocin ointment (BACTROBAN) 2 %, Apply to right foot ulcer once daily. (Patient taking differently: Apply 1 application topically daily as needed (wound care). Apply to right foot ulcer once daily.), Disp: 30 g, Rfl: 1 .  nortriptyline (PAMELOR) 25 MG capsule, Take 1 capsule (25 mg total) by mouth at bedtime. (Patient taking differently: Take 25 mg by mouth 2 (two) times daily. ), Disp: 30 capsule, Rfl: 0 .  Omega-3 Fatty Acids (FISH OIL) 1200 MG CAPS, Take 1,200 mg by mouth daily with lunch. , Disp: , Rfl:  .   omeprazole (PRILOSEC) 20 MG capsule, Take 20 mg by mouth every morning., Disp: , Rfl:  .  ONE TOUCH ULTRA TEST test strip, , Disp: , Rfl:  .  Polyethyl Glycol-Propyl Glycol (SYSTANE) 0.4-0.3 % SOLN, Place 1 drop into both eyes 3 (three) times daily as needed (dry eyes)., Disp: , Rfl:  .  PRESCRIPTION MEDICATION, by Intravitreal route every 30 (thirty) days. Ophthalmic injection at MD office in left eye, Disp: , Rfl:  .  RESTASIS 0.05 % ophthalmic emulsion, Place 1 drop into both eyes 2 (two) times daily. , Disp: , Rfl:  .  silver sulfADIAZINE (SILVADENE) 1 % cream, Apply pea-sized amount to wound daily. (Patient taking differently: Apply 1 application topically daily. Apply pea-sized amount to wound daily.), Disp: 50 g, Rfl: 0 .  simvastatin (ZOCOR)  20 MG tablet, Take 20 mg by mouth at bedtime. , Disp: , Rfl:  .  traMADol (ULTRAM) 50 MG tablet, Take 1 tablet (50 mg total) by mouth 2 (two) times daily as needed. (Patient not taking: Reported on 09/18/2019), Disp: 20 tablet, Rfl: 0 .  vitamin B-12 (CYANOCOBALAMIN) 1000 MCG tablet, Take 1,000 mcg by mouth 2 (two) times daily. , Disp: , Rfl:   Current Facility-Administered Medications:  .  Bevacizumab (AVASTIN) SOLN 1.25 mg, 1.25 mg, Intravitreal, , Bernarda Caffey, MD, 1.25 mg at 03/01/18 1344 .  Bevacizumab (AVASTIN) SOLN 1.25 mg, 1.25 mg, Intravitreal, , Bernarda Caffey, MD, 1.25 mg at 03/31/18 1449 No Known Allergies Social History   Tobacco Use  Smoking Status Former Smoker  Smokeless Tobacco Never Used    Objective:  There were no vitals filed for this visit. Constitutional Patient is a pleasant 86 y.o. Caucasian female in NAD. AAO x 3.  Vascular Capillary refill time to digits immediate b/l. Palpable pedal pulses b/l LE. Pedal hair absent. Lower extremity skin temperature gradient within normal limits. No cyanosis or clubbing noted.  Neurologic Normal speech. Protective sensation diminished with 10g monofilament b/l. Vibratory sensation  diminished b/l.  Dermatologic Pedal skin with normal turgor, texture and tone bilaterally. No open wounds bilaterally. No interdigital macerations bilaterally. Toenails 1-5 left, R hallux, R 3rd toe, R 4th toe and R 5th toe elongated, discolored, dystrophic, thickened, and crumbly with subungual debris and tenderness to dorsal palpation. Hyperkeratotic lesion(s) submet head 2 right foot.  No erythema, no edema, no drainage, no flocculence.  Orthopedic: Normal muscle strength 5/5 to all lower extremity muscle groups bilaterally. No pain crepitus or joint limitation noted with ROM b/l. Hammertoes noted to the R 2nd toe. 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 Utilizes cane for ambulation assistance.   Hemoglobin A1C Latest Ref Rng & Units 09/21/2019 12/17/2018  HGBA1C 4.8 - 5.6 % 8.8(H) 9.1(H)  Some recent data might be hidden   Assessment:   1. Pain due to onychomycosis of toenails of both feet   2. Callus   3. History of partial amputation of toe of right foot ([place])   4. Diabetic peripheral neuropathy associated with type 2 diabetes mellitus ([place])    Plan:  Patient was evaluated and treated and all questions answered.  Onychomycosis with pain -Nails palliatively debridement as below. -Educated on self-care  Procedure: Nail Debridement Rationale: Pain Type of Debridement: manual, sharp debridement. Instrumentation: Nail nipper, rotary burr. Number of Nails: 9  -Examined patient. -Continue diabetic foot care principles. -Toenails 1-5 left, R hallux, R 3rd toe, R 4th toe and R 5th toe debrided in length and girth without iatrogenic bleeding with sterile nail nipper and dremel.  -Callus(es) submet head 2 right foot pared utilizing sterile scalpel blade without complication or incident. Total number debrided =1. -Patient to report any pedal injuries to medical professional immediately. -Patient to continue soft, supportive shoe gear daily. -Patient/POA to call  should there be question/concern in the interim.  Return in about 3 months (around 02/13/2020) for diabetic toenails, corn(s)/callus(es).  Marzetta Board, DPM

## 2019-11-18 NOTE — Discharge Instructions (Addendum)
You were seen in the emergency department for recurrent right foot pain and newly dilated varicose veins.  You had an ultrasound that showed no evidence of a blood clot.  Recommend that you follow-up with your primary care doctor.  There is a risk that there is a very distal clot that may slowly worsen so if you experience increased swelling of the calf or pain in the calf you may need another ultrasound.

## 2019-12-13 NOTE — Progress Notes (Addendum)
[facility] Note  12/17/2019   CHIEF COMPLAINT Patient presents for Retina Follow Up  HISTORY OF PRESENT ILLNESS: Sue [name] is a 86 y.o. female who presents to the clinic today for:   HPI    Retina Follow Up    Patient presents with  Wet AMD.  In left eye.  This started weeks ago.  Severity is moderate.  Duration of weeks.  Since onset it is stable.  I, the attending physician,  performed the HPI with the patient and updated documentation appropriately.          Comments    Pt states vision is the same OU.  Pt denies eye pain or discomfort.  Pt complains of persistent floater OS that has been present since last injection per patient.       Last edited by Bernarda Caffey, MD on 12/17/2019 12:20 PM. (History)    pt states no changes in vision, she has started to notice a new floater that comes and goes  Referring physician: Merrilee Seashore, MD [place],   [id number]  HISTORICAL INFORMATION:   Selected notes from the MEDICAL RECORD NUMBER Referred by Dr. Quentin Ore for concern of ARMD OU    CURRENT MEDICATIONS: Current Outpatient Medications (Ophthalmic Drugs)  Medication Sig  . Polyethyl Glycol-Propyl Glycol (SYSTANE) 0.4-0.3 % SOLN Place 1 drop into both eyes 3 (three) times daily as needed (dry eyes).  . RESTASIS 0.05 % ophthalmic emulsion Place 1 drop into both eyes 2 (two) times daily.    No current facility-administered medications for this visit. (Ophthalmic Drugs)   Current Outpatient Medications (Other)  Medication Sig  . amLODipine (NORVASC) 10 MG tablet Take 10 mg by mouth every morning.   . B-D ULTRAFINE III SHORT PEN 31G X 8 MM MISC USE AS DIRECTED ONCE DAILY SUBCUTANEOUSLY  . calcium-vitamin D (OSCAL WITH D) 500-200 MG-UNIT tablet Take 1 tablet by mouth 2 (two) times daily.   . cephALEXin (KEFLEX) 500 MG capsule Take 1 capsule (500 mg total) by mouth 2 (two) times daily.  .  diphenhydramine-acetaminophen (TYLENOL PM) 25-500 MG TABS tablet Take 1 tablet by mouth at bedtime as needed (sleep).  Marland Kitchen glucose blood test strip OneTouch Ultra Test strips  TEST ONCE A DAY ONCE A DAY FINGERSTICK 90  . hydrALAZINE (APRESOLINE) 25 MG tablet Take 3 tablets (75 mg total) by mouth every 8 (eight) hours.  . hydrochlorothiazide (HYDRODIURIL) 25 MG tablet Take 25 mg by mouth daily.  Marland Kitchen HYDROcodone-acetaminophen (NORCO/VICODIN) 5-325 MG tablet Take 1 tablet by mouth every 4 (four) hours as needed for moderate pain.  Marland Kitchen ibuprofen (ADVIL) 200 MG tablet Take 400 mg by mouth every 6 (six) hours as needed for moderate pain.  . Insulin Degludec (TRESIBA FLEXTOUCH) 200 UNIT/ML SOPN Inject 36 Units into the skin at bedtime. (Patient taking differently: Inject 22 Units into the skin at bedtime. )  . Insulin Pen Needle (PEN NEEDLES) 32G X 4 MM MISC BD Ultra-Fine Nano Pen Needle 32 gauge x 5/32"  USE AS DIRECTED DAILY  . JANUVIA 25 MG tablet Take 25 mg by mouth daily.  . Lancets (ONETOUCH ULTRASOFT) lancets OneTouch UltraSoft Lancets  USE TWICE DAILY  . Melatonin 10 MG CAPS Take 10 mg by mouth at bedtime as needed (sleep).  . meloxicam (MOBIC) 7.5 MG tablet Take 1 tablet (7.5 mg total) by mouth daily as needed for pain. (Patient not taking: Reported on 09/18/2019)  .  Multiple Vitamins-Minerals (ADULT ONE DAILY GUMMIES) CHEW Chew 1 tablet by mouth 2 (two) times daily. Centrum   . Multiple Vitamins-Minerals (AIRBORNE) TBEF Take 1 tablet by mouth daily as needed (immune support).   . Multiple Vitamins-Minerals (PRESERVISION AREDS 2) CAPS Take 1 capsule by mouth 2 (two) times daily.  . mupirocin ointment (BACTROBAN) 2 % Apply to right foot ulcer once daily. (Patient taking differently: Apply 1 application topically daily as needed (wound care). Apply to right foot ulcer once daily.)  . nortriptyline (PAMELOR) 25 MG capsule Take 1 capsule (25 mg total) by mouth at bedtime. (Patient taking differently: Take  25 mg by mouth 2 (two) times daily. )  . Omega-3 Fatty Acids (FISH OIL) 1200 MG CAPS Take 1,200 mg by mouth daily with lunch.   Marland Kitchen omeprazole (PRILOSEC) 20 MG capsule Take 20 mg by mouth every morning.  . ONE TOUCH ULTRA TEST test strip   . PRESCRIPTION MEDICATION by Intravitreal route every 30 (thirty) days. Ophthalmic injection at MD office in left eye  . silver sulfADIAZINE (SILVADENE) 1 % cream Apply pea-sized amount to wound daily. (Patient taking differently: Apply 1 application topically daily. Apply pea-sized amount to wound daily.)  . simvastatin (ZOCOR) 20 MG tablet Take 20 mg by mouth at bedtime.   . traMADol (ULTRAM) 50 MG tablet Take 1 tablet (50 mg total) by mouth 2 (two) times daily as needed. (Patient not taking: Reported on 09/18/2019)  . vitamin B-12 (CYANOCOBALAMIN) 1000 MCG tablet Take 1,000 mcg by mouth 2 (two) times daily.    Current Facility-Administered Medications (Other)  Medication Route  . Bevacizumab (AVASTIN) SOLN 1.25 mg Intravitreal  . Bevacizumab (AVASTIN) SOLN 1.25 mg Intravitreal      REVIEW OF SYSTEMS: ROS    Positive for: Gastrointestinal, Genitourinary, Endocrine, Eyes   Negative for: Constitutional, Neurological, Skin, Musculoskeletal, HENT, Cardiovascular, Respiratory, Psychiatric, Allergic/Imm, Heme/Lymph   Last edited by Doneen Poisson on 12/17/2019 10:26 AM. (History)       ALLERGIES No Known Allergies  PAST MEDICAL HISTORY Past Medical History:  Diagnosis Date  . Anemia   . Arthritis   . Basal cell carcinoma 11/23/2017   left elbow, right neck TX CX3 5FU   . Basal cell carcinoma 04/04/2019   infil-right sideburn (CX35FU)  . Chronic kidney disease    stage 3 ckd stage 3 b per dr Ashby Dawes 09-26-2019 note on chart  . Complication of anesthesia    deifficulty waking up  . COVID-19   . Diabetic neuropathy ([place])   . Hyperlipidemia   . Hypertension   . Hypertensive retinopathy    OU  . Macular degeneration    OU  . Pneumonia    . Right carotid bruit per dr Ashby Dawes 09-26-2019 note on chart  . SCC (squamous cell carcinoma) 11/23/2017   right forehead TX CX3 5FU  . Type 2 DM with CKD stage 3 and hypertension ([place])    Past Surgical History:  Procedure Laterality Date  . ABDOMINAL HYSTERECTOMY    . AMPUTATION TOE Right 09/28/2019   Procedure: Right 2nd toe amputation - partial;  Surgeon: Evelina Bucy, DPM;  Location: [facility];  Service: Podiatry;  Laterality: Right;  . CATARACT EXTRACTION    . CHOLECYSTECTOMY    . EYE SURGERY    . HERNIA REPAIR    . SPINE SURGERY    . TONSILLECTOMY      FAMILY HISTORY History reviewed. No pertinent family history.  SOCIAL HISTORY Social History  Tobacco Use  . Smoking status: Former Research scientist (life sciences)  . Smokeless tobacco: Never Used  Vaping Use  . Vaping Use: Never used  Substance Use Topics  . Alcohol use: No    Alcohol/week: 0.0 standard drinks  . Drug use: No         OPHTHALMIC EXAM:  Base Eye Exam    Visual Acuity (Snellen - Linear)      Right Left   Dist cc 20/30 +1 20/50 -2   Dist ph cc NI    Correction: Glasses       Tonometry (Tonopen, 10:30 AM)      Right Left   Pressure 17 15       Pupils      Dark Light Shape React APD   Right 3 2 Round Brisk 0   Left 3 2 Round Brisk 0       Visual Fields      Left Right    Full Full       Extraocular Movement      Right Left    Full Full       Neuro/Psych    Oriented x3: Yes   Mood/Affect: Normal       Dilation    Both eyes: 1.0% Mydriacyl, 2.5% Phenylephrine @ 10:30 AM        Slit Lamp and Fundus Exam    Slit Lamp Exam      Right Left   Lids/Lashes Dermatochalasis - upper lid, Telangiectasia, mild MGD Dermatochalasis - upper lid, Telangiectasia, Meibomian gland dysfunction   Conjunctiva/Sclera White and quiet White and quiet   Cornea Temporal Well healed cataract wounds, mild Arcus, trace endo pigment nasally, 1+PEE Arcus, Temporal Well healed cataract wounds, 1-2+  PEE   Anterior Chamber Deep and quiet Deep and quiet   Iris Round and moderately dilated to 4.49mm Round and moderately dilated to 6mm, Iris atrophy from 0100 to 0230, peripheral irodotimy at 0200   Lens 3-piece PC IOL in good position PC IOL in good position   Vitreous Vitreous syneresis, Posterior vitreous detachment, Weiss ring, vitreous condensations Vitreous syneresis, PVD, vitreous condensations       Fundus Exam      Right Left   Disc 360 Peripapillary atrophy, diffuse mild pallor, compact, sharp rim mild pallor, Peripapillary atrophy and pigmentation   C/D Ratio 0.1 0.2   Macula Blunted foveal reflex, +PED, RPE mottling and clumping, Drusen, No heme or edema Blunted foveal reflex, PED / +CNVM with stable resolution of low lying SRF, RPE mottling and clumping, Drusen, No heme or edema   Vessels Vascular attenuation Vascular attenuation   Periphery Attached, 360 Reticular degeneration Attached, 360 Reticular degeneration        Refraction    Wearing Rx      Sphere Cylinder Axis   Right -1.50 +0.75 180   Left -0.50 +0.75 180          IMAGING AND PROCEDURES  Imaging and Procedures for @TODAY @  OCT, Retina - OU - Both Eyes       Right Eye Quality was good. Central Foveal Thickness: 238. Progression has been stable. Findings include normal foveal contour, no IRF, no SRF, pigment epithelial detachment, outer retinal atrophy, retinal drusen  (persistent PED).   Left Eye Quality was good. Central Foveal Thickness: 259. Progression has been stable. Findings include no IRF, retinal drusen , pigment epithelial detachment, outer retinal atrophy, epiretinal membrane, abnormal foveal contour, no SRF (stable improvement in IRF overlying low lying  PED, partial PVD).   Notes *Images captured and stored on drive  Diagnosis / Impression:  OD: Non-Exudative ARMD -- stable OS: Exudative ARMD --stable improvement in IRF overlying low lying PED   Clinical management:  See  below  Abbreviations: NFP - Normal foveal profile. CME - cystoid macular edema. PED - pigment epithelial detachment. IRF - intraretinal fluid. SRF - subretinal fluid. EZ - ellipsoid zone. ERM - epiretinal membrane. ORA - outer retinal atrophy. ORT - outer retinal tubulation. SRHM - subretinal hyper-reflective material         Intravitreal Injection, Pharmacologic Agent - OS - Left Eye       Time Out 12/17/2019. 11:32 AM. Confirmed correct patient, procedure, site, and patient consented.   Anesthesia Topical anesthesia was used. Anesthetic medications included Lidocaine 2%, Proparacaine 0.5%.   Procedure Preparation included 5% betadine to ocular surface, eyelid speculum. A supplied (32g) needle was used.   Injection:  1.25 mg Bevacizumab (AVASTIN) SOLN   NDC: 65790-383-33, Lot: [id number] , Expiration date: 01/22/2020   Route: Intravitreal, Site: Left Eye, Waste: 0 mL  Post-op Post injection exam found visual acuity of at least counting fingers. The patient tolerated the procedure well. There were no complications. The patient received written and verbal post procedure care education. Post injection medications were not given.                 ASSESSMENT/PLAN:    ICD-10-CM   1. Exudative age-related macular degeneration of left eye with active choroidal neovascularization (HCC)  H35.3221 Intravitreal Injection, Pharmacologic Agent - OS - Left Eye    Bevacizumab (AVASTIN) SOLN 1.25 mg  2. Retinal edema  H35.81 OCT, Retina - OU - Both Eyes  3. 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 Diabetes mellitus type 2 without retinopathy ([place])  E11.9   5. Essential hypertension  I10   6. Hypertensive retinopathy of both eyes  H35.033   7. Pseudophakia of both eyes  Z96.1    1,2. Exudative age-related macular degeneration, left eye  - conversion of OS from nonexudative to exudative ARMD prior to 02.05.20 visit  - s/p IVA OS #1  (02.05.20), #2 (03.04.20), #3 (04.30.20), #4 (06.02.20), #5 (07.14.20), #6 (9.1.20), #7 (10.27.20), #8 (12.23.20), #9 (02.03.21), #10 (03.17.21), #11 (04.28.21), #12 (06.09.21), #13 (08.04.21), #14 (09.29.21)  - **history of recurrent SRF with extension to 8 wks, noted on 12.23.20 -- stable on 9.30.21 and today 11.22.21 at 8 wks**  - OCT shows stable resolution of SRF overlying PED at 8 wk interval today  - BCVA decreased to 20/50 from 20/40  - recommend IVA #15 OS today 11.22.21 -- maintenance  - pt wishes to be treated with IVA OS  - risks and benefits of intravitreal avastin discussed with patient  - informed consent obtained  - see procedure note  - Avastin informed consent obtained and scanned 02.03.21  - f/u in 8 wks for DFE, OCT, likely injection OS   3. Age related macular degeneration, non-exudative, right eye  - stable, former pt of Dr. 15.03.21 -- no significant change in OCT or exam  - The incidence, anatomy, and pathology of dry AMD, risk of progression, and the AREDS and AREDS 2 study including smoking risks discussed with patient.  - recommend amsler grid monitoring  4. Diabetes mellitus, type 2 without retinopathy  - The incidence, risk factors for progression, natural history and treatment options for diabetic retinopathy  were discussed with patient.    -  The need for close monitoring of blood glucose, blood pressure, and serum lipids, avoiding cigarette or any type of tobacco, and the need for long term follow up was also discussed with patient.  - f/u in 1 year  5,6. Hypertensive retinopathy OU  - discussed importance of tight BP control  - monitor  7. Pseudophakia OU  - s/p CE/IOL OU  - beautiful surgeries, doing well  - monitor  Ophthalmic Meds Ordered this visit:  Meds ordered this encounter  Medications  . Bevacizumab (AVASTIN) SOLN 1.25 mg   Return in about 8 weeks (around 02/11/2020) for f/u exu ARMD OS, DFE, OCT.  There are no Patient Instructions on  file for this visit.   This document serves as a record of services personally performed by Gardiner Sleeper, MD, PhD. It was created on their behalf by Leeann Must, [place], an ophthalmic technician. The creation of this record is the provider's dictation and/or activities during the visit.    Electronically signed by: Leeann Must, [place] 11.18.2021 12:26 PM   This document serves as a record of services personally performed by Gardiner Sleeper, MD, PhD. It was created on their behalf by San Jetty. [place]ark, OA an ophthalmic technician. The creation of this record is the provider's dictation and/or activities during the visit.    Electronically signed by: San Jetty. Marguerita Merles 11.22.2021 12:26 PM  Gardiner Sleeper, M.D., Ph.D. Diseases & Surgery of the Retina and [place] 12/17/2019   I have reviewed the above documentation for accuracy and completeness, and I agree with the above. Gardiner Sleeper, M.D., Ph.D. 12/17/19 12:26 PM    Abbreviations: M myopia (nearsighted); A astigmatism; H hyperopia (farsighted); P presbyopia; Mrx spectacle prescription;  CTL contact lenses; OD right eye; OS left eye; OU both eyes  XT exotropia; ET esotropia; PEK punctate epithelial keratitis; PEE punctate epithelial erosions; DES dry eye syndrome; MGD meibomian gland dysfunction; ATs artificial tears; PFAT's preservative free artificial tears; [place] nuclear sclerotic cataract; PSC posterior subcapsular cataract; ERM epi-retinal membrane; PVD posterior vitreous detachment; RD retinal detachment; DM diabetes mellitus; DR diabetic retinopathy; NPDR non-proliferative diabetic retinopathy; PDR proliferative diabetic retinopathy; CSME clinically significant macular edema; DME diabetic macular edema; dbh dot blot hemorrhages; CWS cotton wool spot; POAG primary open angle glaucoma; C/D cup-to-disc ratio; HVF humphrey visual field; GVF goldmann visual field; OCT optical coherence tomography; IOP  intraocular pressure; BRVO Branch retinal vein occlusion; CRVO central retinal vein occlusion; CRAO central retinal artery occlusion; BRAO branch retinal artery occlusion; RT retinal tear; SB scleral buckle; PPV pars plana vitrectomy; VH Vitreous hemorrhage; PRP panretinal laser photocoagulation; IVK intravitreal kenalog; VMT vitreomacular traction; MH Macular hole;  NVD neovascularization of the disc; NVE neovascularization elsewhere; AREDS age related eye disease study; ARMD age related macular degeneration; POAG primary open angle glaucoma; EBMD epithelial/anterior basement membrane dystrophy; ACIOL anterior chamber intraocular lens; IOL intraocular lens; PCIOL posterior chamber intraocular lens; Phaco/IOL phacoemulsification with intraocular lens placement; [place] photorefractive keratectomy; LASIK laser assisted in situ keratomileusis; HTN hypertension; DM diabetes mellitus; COPD chronic obstructive pulmonary disease

## 2019-12-17 ENCOUNTER — Encounter (INDEPENDENT_AMBULATORY_CARE_PROVIDER_SITE_OTHER): Payer: Self-pay | Admitting: Ophthalmology

## 2019-12-17 ENCOUNTER — Other Ambulatory Visit: Payer: Self-pay

## 2019-12-17 ENCOUNTER — Ambulatory Visit (INDEPENDENT_AMBULATORY_CARE_PROVIDER_SITE_OTHER): Payer: Medicare Other | Admitting: Ophthalmology

## 2019-12-17 DIAGNOSIS — H353221 Exudative age-related macular degeneration, left eye, with active choroidal neovascularization: Secondary | ICD-10-CM

## 2019-12-17 DIAGNOSIS — H35033 Hypertensive retinopathy, bilateral: Secondary | ICD-10-CM

## 2019-12-17 DIAGNOSIS — I10 Essential (primary) hypertension: Secondary | ICD-10-CM | POA: Diagnosis not present

## 2019-12-17 DIAGNOSIS — E119 Type 2 diabetes mellitus without complications: Secondary | ICD-10-CM | POA: Diagnosis not present

## 2019-12-17 DIAGNOSIS — H3581 Retinal edema: Secondary | ICD-10-CM

## 2019-12-17 DIAGNOSIS — H353112 Nonexudative age-related macular degeneration, right eye, intermediate dry stage: Secondary | ICD-10-CM | POA: Diagnosis not present

## 2019-12-17 DIAGNOSIS — H353132 Nonexudative age-related macular degeneration, bilateral, intermediate dry stage: Secondary | ICD-10-CM

## 2019-12-17 DIAGNOSIS — Z961 Presence of intraocular lens: Secondary | ICD-10-CM

## 2019-12-17 MED ORDER — BEVACIZUMAB CHEMO INJECTION 1.25MG/0.05ML SYRINGE FOR KALEIDOSCOPE
1.2500 mg | INTRAVITREAL | Status: AC | PRN
  Administered 2019-12-17: 1.25 mg via INTRAVITREAL

## 2020-02-11 ENCOUNTER — Ambulatory Visit: Payer: Medicare Other | Admitting: Dermatology

## 2020-02-19 ENCOUNTER — Ambulatory Visit (INDEPENDENT_AMBULATORY_CARE_PROVIDER_SITE_OTHER): Payer: Medicare Other | Admitting: Podiatry

## 2020-02-19 ENCOUNTER — Encounter: Payer: Self-pay | Admitting: Podiatry

## 2020-02-19 ENCOUNTER — Other Ambulatory Visit: Payer: Self-pay

## 2020-02-19 DIAGNOSIS — R9431 Abnormal electrocardiogram [ECG] [EKG]: Secondary | ICD-10-CM | POA: Insufficient documentation

## 2020-02-19 DIAGNOSIS — R0989 Other specified symptoms and signs involving the circulatory and respiratory systems: Secondary | ICD-10-CM | POA: Insufficient documentation

## 2020-02-19 DIAGNOSIS — Z89421 Acquired absence of other right toe(s): Secondary | ICD-10-CM

## 2020-02-19 DIAGNOSIS — E1142 Type 2 diabetes mellitus with diabetic polyneuropathy: Secondary | ICD-10-CM | POA: Diagnosis not present

## 2020-02-19 DIAGNOSIS — M79674 Pain in right toe(s): Secondary | ICD-10-CM | POA: Diagnosis not present

## 2020-02-19 DIAGNOSIS — Z0000 Encounter for general adult medical examination without abnormal findings: Secondary | ICD-10-CM | POA: Insufficient documentation

## 2020-02-19 DIAGNOSIS — B351 Tinea unguium: Secondary | ICD-10-CM | POA: Diagnosis not present

## 2020-02-19 DIAGNOSIS — L84 Corns and callosities: Secondary | ICD-10-CM | POA: Diagnosis not present

## 2020-02-19 DIAGNOSIS — M79675 Pain in left toe(s): Secondary | ICD-10-CM

## 2020-02-19 DIAGNOSIS — E1165 Type 2 diabetes mellitus with hyperglycemia: Secondary | ICD-10-CM | POA: Insufficient documentation

## 2020-02-19 DIAGNOSIS — D508 Other iron deficiency anemias: Secondary | ICD-10-CM | POA: Insufficient documentation

## 2020-02-19 NOTE — Progress Notes (Signed)
[facility] Note  02/20/2020   CHIEF COMPLAINT Patient presents for Retina Follow Up  HISTORY OF PRESENT ILLNESS: Sue [name] is a 87 y.o. female who presents to the clinic today for:   HPI    Retina Follow Up    Patient presents with  Wet AMD.  In left eye.  Duration of 9 weeks.  Since onset it is stable.  I, the attending physician,  performed the HPI with the patient and updated documentation appropriately.          Comments    9 week follow up Exu ARMD OS- Doing well.  Vision is about the same OU.  She had COVID for the second time since she was here last.  Eyes did bother her at that time but better now- 'watering and they just filled bad."  Using Restasis BID OU. BS 150 this am (normally around 100). A1C 8.8       Last edited by Bernarda Caffey, MD on 02/22/2020  1:29 PM. (History)    pt states   Referring physician: Merrilee Seashore, [address],  [place] [address]  HISTORICAL INFORMATION:   Selected notes from the MEDICAL RECORD NUMBER Referred by Dr. Quentin Ore for concern of ARMD OU    CURRENT MEDICATIONS: Current Outpatient Medications (Ophthalmic Drugs)  Medication Sig  . RESTASIS 0.05 % ophthalmic emulsion Place 1 drop into both eyes 2 (two) times daily.   Vladimir Faster Glycol-Propyl Glycol (SYSTANE) 0.4-0.3 % SOLN Place 1 drop into both eyes 3 (three) times daily as needed (dry eyes). (Patient not taking: Reported on 02/20/2020)   No current facility-administered medications for this visit. (Ophthalmic Drugs)   Current Outpatient Medications (Other)  Medication Sig  . amLODipine (NORVASC) 10 MG tablet Take 10 mg by mouth every morning.   . B-D ULTRAFINE III SHORT PEN 31G X 8 MM MISC USE AS DIRECTED ONCE DAILY SUBCUTANEOUSLY  . calcium-vitamin D (OSCAL WITH D) 500-200 MG-UNIT tablet Take 1 tablet by mouth 2 (two) times daily.   . diphenhydramine-acetaminophen (TYLENOL PM) 25-500 MG TABS tablet Take  1 tablet by mouth at bedtime as needed (sleep).  . hydrALAZINE (APRESOLINE) 25 MG tablet Take 3 tablets (75 mg total) by mouth every 8 (eight) hours.  . hydrochlorothiazide (HYDRODIURIL) 25 MG tablet Take 25 mg by mouth daily.  Marland Kitchen HYDROcodone-acetaminophen (NORCO/VICODIN) 5-325 MG tablet Take 1 tablet by mouth every 4 (four) hours as needed for moderate pain.  Marland Kitchen ibuprofen (ADVIL) 200 MG tablet Take 400 mg by mouth every 6 (six) hours as needed for moderate pain.  . Insulin Degludec (TRESIBA FLEXTOUCH) 200 UNIT/ML SOPN Inject 36 Units into the skin at bedtime. (Patient taking differently: Inject 22 Units into the skin at bedtime.)  . Insulin Pen Needle (PEN NEEDLES) 32G X 4 MM MISC BD Ultra-Fine Nano Pen Needle 32 gauge x 5/32"  USE AS DIRECTED DAILY  . JANUVIA 25 MG tablet Take 25 mg by mouth daily.  Marland Kitchen ketoconazole (NIZORAL) 2 % cream Apply topically.  . Lancets (ONETOUCH ULTRASOFT) lancets OneTouch UltraSoft Lancets  USE TWICE DAILY  . Melatonin 10 MG CAPS Take 10 mg by mouth at bedtime as needed (sleep).  . Multiple Vitamins-Minerals (ADULT ONE DAILY GUMMIES) CHEW Chew 1 tablet by mouth 2 (two) times daily. Centrum  . Multiple Vitamins-Minerals (AIRBORNE) TBEF Take 1 tablet by mouth daily as needed (immune support).   . Multiple Vitamins-Minerals (PRESERVISION AREDS 2) CAPS Take  1 capsule by mouth 2 (two) times daily.  . mupirocin ointment (BACTROBAN) 2 % Apply to right foot ulcer once daily. (Patient taking differently: Apply 1 application topically daily as needed (wound care). Apply to right foot ulcer once daily.)  . nortriptyline (PAMELOR) 25 MG capsule Take 1 capsule (25 mg total) by mouth at bedtime. (Patient taking differently: Take 25 mg by mouth 2 (two) times daily.)  . Omega-3 Fatty Acids (FISH OIL) 1200 MG CAPS Take 1,200 mg by mouth daily with lunch.   Marland Kitchen omeprazole (PRILOSEC) 20 MG capsule Take 20 mg by mouth every morning.  Marland Kitchen PRESCRIPTION MEDICATION by Intravitreal route every 30  (thirty) days. Ophthalmic injection at MD office in left eye  . silver sulfADIAZINE (SILVADENE) 1 % cream Apply pea-sized amount to wound daily. (Patient taking differently: Apply 1 application topically daily. Apply pea-sized amount to wound daily.)  . simvastatin (ZOCOR) 20 MG tablet Take 20 mg by mouth at bedtime.   . traMADol (ULTRAM) 50 MG tablet Take 1 tablet (50 mg total) by mouth 2 (two) times daily as needed.  . vitamin B-12 (CYANOCOBALAMIN) 1000 MCG tablet Take 1,000 mcg by mouth 2 (two) times daily.   . cephALEXin (KEFLEX) 500 MG capsule Take 1 capsule (500 mg total) by mouth 2 (two) times daily. (Patient not taking: Reported on 02/20/2020)  . glucose blood test strip OneTouch Ultra Test strips  TEST ONCE A DAY ONCE A DAY FINGERSTICK 90  . meloxicam (MOBIC) 7.5 MG tablet Take 1 tablet (7.5 mg total) by mouth daily as needed for pain. (Patient not taking: No sig reported)  . ONE TOUCH ULTRA TEST test strip    Current Facility-Administered Medications (Other)  Medication Route  . Bevacizumab (AVASTIN) SOLN 1.25 mg Intravitreal  . Bevacizumab (AVASTIN) SOLN 1.25 mg Intravitreal      REVIEW OF SYSTEMS: ROS    Positive for: Gastrointestinal, Genitourinary, Musculoskeletal, Endocrine, Eyes, Psychiatric, Heme/Lymph   Negative for: Constitutional, Neurological, Skin, HENT, Cardiovascular, Respiratory, Allergic/Imm   Last edited by Leonie Douglas, COA on 02/20/2020  2:34 PM. (History)       ALLERGIES No Known Allergies  PAST MEDICAL HISTORY Past Medical History:  Diagnosis Date  . Anemia   . Arthritis   . Basal cell carcinoma 11/23/2017   left elbow, right neck TX CX3 5FU   . Basal cell carcinoma 04/04/2019   infil-right sideburn (CX35FU)  . Chronic kidney disease    stage 3 ckd stage 3 b per dr Ashby Dawes 09-26-2019 note on chart  . Complication of anesthesia    deifficulty waking up  . COVID-19   . Diabetic neuropathy ([place])   . Hyperlipidemia   . Hypertension   .  Hypertensive retinopathy    OU  . Macular degeneration    OU  . Pneumonia   . Right carotid bruit per dr Ashby Dawes 09-26-2019 note on chart  . SCC (squamous cell carcinoma) 11/23/2017   right forehead TX CX3 5FU  . Type 2 DM with CKD stage 3 and hypertension ([place])    Past Surgical History:  Procedure Laterality Date  . ABDOMINAL HYSTERECTOMY    . AMPUTATION TOE Right 09/28/2019   Procedure: Right 2nd toe amputation - partial;  Surgeon: Evelina Bucy, DPM;  Location: [facility];  Service: Podiatry;  Laterality: Right;  . CATARACT EXTRACTION    . CHOLECYSTECTOMY    . EYE SURGERY    . HERNIA REPAIR    . SPINE SURGERY    .  TONSILLECTOMY      FAMILY HISTORY History reviewed. No pertinent family history.  SOCIAL HISTORY Social History   Tobacco Use  . Smoking status: Former Research scientist (life sciences)  . Smokeless tobacco: Never Used  Vaping Use  . Vaping Use: Never used  Substance Use Topics  . Alcohol use: No    Alcohol/week: 0.0 standard drinks  . Drug use: No         OPHTHALMIC EXAM:  Base Eye Exam    Visual Acuity (Snellen - Linear)      Right Left   Dist cc 20/40 20/50-   Dist ph cc NI 20/30   Correction: Glasses       Tonometry (Tonopen, 2:42 PM)      Right Left   Pressure 17 15       Pupils      Dark Light Shape React APD   Right 3 2 Round Minimal None   Left 3 2 Round Minimal None       Visual Fields (Counting fingers)      Left Right    Full Full       Extraocular Movement      Right Left    Full Full       Neuro/Psych    Oriented x3: Yes   Mood/Affect: Normal       Dilation    Both eyes: 1.0% Mydriacyl, 2.5% Phenylephrine @ 2:43 PM        Slit Lamp and Fundus Exam    Slit Lamp Exam      Right Left   Lids/Lashes Dermatochalasis - upper lid, Telangiectasia, mild MGD Dermatochalasis - upper lid, Telangiectasia, Meibomian gland dysfunction   Conjunctiva/Sclera White and quiet White and quiet   Cornea Temporal Well healed cataract  wounds, mild Arcus, trace endo pigment nasally, 1+PEE Arcus, Temporal Well healed cataract wounds, 1-2+ PEE   Anterior Chamber Deep and quiet Deep and quiet   Iris Round and moderately dilated to 4.36mm Round and moderately dilated to 2mm, Iris atrophy from 0100 to 0230, peripheral irodotimy at 0200   Lens 3-piece PC IOL in good position PC IOL in good position   Vitreous Vitreous syneresis, Posterior vitreous detachment, Weiss ring, vitreous condensations Vitreous syneresis, PVD, vitreous condensations       Fundus Exam      Right Left   Disc 360 Peripapillary atrophy, diffuse mild pallor, compact, sharp rim mild pallor, Peripapillary atrophy and pigmentation   C/D Ratio 0.1 0.2   Macula Blunted foveal reflex, +PED, RPE mottling and clumping, Drusen, No heme or edema Blunted foveal reflex, PED / +CNVM with stable resolution of low lying SRF, RPE mottling and clumping, Drusen, No heme or edema   Vessels Vascular attenuation Vascular attenuation   Periphery Attached, 360 Reticular degeneration Attached, 360 Reticular degeneration          IMAGING AND PROCEDURES  Imaging and Procedures for @TODAY @  OCT, Retina - OU - Both Eyes       Right Eye Quality was good. Central Foveal Thickness: 236. Progression has been stable. Findings include normal foveal contour, no IRF, no SRF, pigment epithelial detachment, outer retinal atrophy, retinal drusen  (persistent PED).   Left Eye Quality was good. Central Foveal Thickness: 264. Progression has been stable. Findings include no IRF, retinal drusen , pigment epithelial detachment, outer retinal atrophy, epiretinal membrane, abnormal foveal contour, no SRF (stable improvement in IRF overlying low lying PED, partial PVD).   Notes *Images captured and stored on drive  Diagnosis / Impression:  OD: Non-Exudative ARMD -- stable OS: Exudative ARMD --stable improvement in IRF overlying low lying PED   Clinical management:  See  below  Abbreviations: NFP - Normal foveal profile. CME - cystoid macular edema. PED - pigment epithelial detachment. IRF - intraretinal fluid. SRF - subretinal fluid. EZ - ellipsoid zone. ERM - epiretinal membrane. ORA - outer retinal atrophy. ORT - outer retinal tubulation. SRHM - subretinal hyper-reflective material         Intravitreal Injection, Pharmacologic Agent - OS - Left Eye       Time Out 02/20/2020. 3:32 PM. Confirmed correct patient, procedure, site, and patient consented.   Anesthesia Topical anesthesia was used. Anesthetic medications included Lidocaine 2%, Proparacaine 0.5%.   Procedure Preparation included 5% betadine to ocular surface, eyelid speculum. A (32g) needle was used.   Injection:  1.25 mg Bevacizumab (AVASTIN) 1.25mg /0.78mL SOLN   NDC: H061816, Lot: [id number], Expiration date: 03/13/2020   Route: Intravitreal, Site: Left Eye, Waste: 0.05 mL  Post-op Post injection exam found visual acuity of at least counting fingers. The patient tolerated the procedure well. There were no complications. The patient received written and verbal post procedure care education. Post injection medications were not given.                 ASSESSMENT/PLAN:    ICD-10-CM   1. Exudative age-related macular degeneration of left eye with active choroidal neovascularization (HCC)  H35.3221 Intravitreal Injection, Pharmacologic Agent - OS - Left Eye    Bevacizumab (AVASTIN) SOLN 1.25 mg  2. Retinal edema  H35.81 OCT, Retina - OU - Both Eyes  3. 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 Diabetes mellitus type 2 without retinopathy ([place])  E11.9   5. Essential hypertension  I10   6. Hypertensive retinopathy of both eyes  H35.033   7. Pseudophakia of both eyes  Z96.1    1,2. Exudative age-related macular degeneration, left eye  - conversion of OS from nonexudative to exudative ARMD prior to 02.05.20 visit  - s/p IVA OS #1  (02.05.20), #2 (03.04.20), #3 (04.30.20), #4 (06.02.20), #5 (07.14.20), #6 (9.1.20), #7 (10.27.20), #8 (12.23.20), #9 (02.03.21), #10 (03.17.21), #11 (04.28.21), #12 (06.09.21), #13 (08.04.21), #14 (09.29.21), #15 (11.22.2021)  - **history of recurrent SRF with extension to 8 wks, noted on 12.23.20 -- stable on 9.30.21 and 11.22.21 at 8 wks**  - OCT shows stable resolution of SRF overlying PED at 9 wk interval today  - BCVA improved to 20/30 from 20/50  - recommend IVA #16 OS today 01.26.22 -- maintenance  - pt wishes to be treated with IVA OS  - risks and benefits of intravitreal avastin discussed with patient  - informed consent obtained  - see procedure note  - Avastin informed consent obtained and scanned 02.03.21  - f/u in 10 wks for DFE, OCT, likely injection OS   3. Age related macular degeneration, non-exudative, right eye  - stable, former pt of Dr. Zigmund Daniel -- no significant change in OCT or exam  - The incidence, anatomy, and pathology of dry AMD, risk of progression, and the AREDS and AREDS 2 study including smoking risks discussed with patient.  - recommend amsler grid monitoring  4. Diabetes mellitus, type 2 without retinopathy  - The incidence, risk factors for progression, natural history and treatment options for diabetic retinopathy  were discussed with patient.    - The need for close monitoring of blood glucose, blood pressure, and  serum lipids, avoiding cigarette or any type of tobacco, and the need for long term follow up was also discussed with patient.  - f/u in 1 year  5,6. Hypertensive retinopathy OU  - discussed importance of tight BP control  - monitor  7. Pseudophakia OU  - s/p CE/IOL OU  - beautiful surgeries, doing well  - monitor  Ophthalmic Meds Ordered this visit:  Meds ordered this encounter  Medications  . Bevacizumab (AVASTIN) SOLN 1.25 mg   Return in about 10 weeks (around 04/30/2020) for f/u exu ARMD OS, DFE, OCT.  There are no Patient  Instructions on file for this visit.     This document serves as a record of services personally performed by Gardiner Sleeper, MD, PhD. It was created on their behalf by Roselee Nova, COMT. The creation of this record is the provider's dictation and/or activities during the visit.  Electronically signed by: Roselee Nova, COMT 02/22/20 1:35 PM   This document serves as a record of services personally performed by Gardiner Sleeper, MD, PhD. It was created on their behalf by San Jetty. Owens Shark, OA an ophthalmic technician. The creation of this record is the provider's dictation and/or activities during the visit.    Electronically signed by: San Jetty. Owens Shark, [place] 01.26.2022 1:35 PM   Gardiner Sleeper, M.D., Ph.D. Diseases & Surgery of the Retina and Vitreous [facility]  I have reviewed the above documentation for accuracy and completeness, and I agree with the above. Gardiner Sleeper, M.D., Ph.D. 02/22/20 1:35 PM   Abbreviations: M myopia (nearsighted); A astigmatism; H hyperopia (farsighted); P presbyopia; Mrx spectacle prescription;  CTL contact lenses; OD right eye; OS left eye; OU both eyes  XT exotropia; ET esotropia; PEK punctate epithelial keratitis; PEE punctate epithelial erosions; DES dry eye syndrome; MGD meibomian gland dysfunction; ATs artificial tears; PFAT's preservative free artificial tears; [place] nuclear sclerotic cataract; PSC posterior subcapsular cataract; ERM epi-retinal membrane; PVD posterior vitreous detachment; RD retinal detachment; DM diabetes mellitus; DR diabetic retinopathy; NPDR non-proliferative diabetic retinopathy; PDR proliferative diabetic retinopathy; CSME clinically significant macular edema; DME diabetic macular edema; dbh dot blot hemorrhages; CWS cotton wool spot; POAG primary open angle glaucoma; C/D cup-to-disc ratio; HVF humphrey visual field; GVF goldmann visual field; OCT optical coherence tomography; IOP intraocular pressure; BRVO Branch  retinal vein occlusion; CRVO central retinal vein occlusion; CRAO central retinal artery occlusion; BRAO branch retinal artery occlusion; RT retinal tear; SB scleral buckle; PPV pars plana vitrectomy; VH Vitreous hemorrhage; PRP panretinal laser photocoagulation; IVK intravitreal kenalog; VMT vitreomacular traction; MH Macular hole;  NVD neovascularization of the disc; NVE neovascularization elsewhere; AREDS age related eye disease study; ARMD age related macular degeneration; POAG primary open angle glaucoma; EBMD epithelial/anterior basement membrane dystrophy; ACIOL anterior chamber intraocular lens; IOL intraocular lens; PCIOL posterior chamber intraocular lens; Phaco/IOL phacoemulsification with intraocular lens placement; [place] photorefractive keratectomy; LASIK laser assisted in situ keratomileusis; HTN hypertension; DM diabetes mellitus; COPD chronic obstructive pulmonary disease

## 2020-02-20 ENCOUNTER — Ambulatory Visit (INDEPENDENT_AMBULATORY_CARE_PROVIDER_SITE_OTHER): Payer: Medicare Other | Admitting: Ophthalmology

## 2020-02-20 DIAGNOSIS — H353221 Exudative age-related macular degeneration, left eye, with active choroidal neovascularization: Secondary | ICD-10-CM | POA: Diagnosis not present

## 2020-02-20 DIAGNOSIS — H3581 Retinal edema: Secondary | ICD-10-CM

## 2020-02-20 DIAGNOSIS — H35033 Hypertensive retinopathy, bilateral: Secondary | ICD-10-CM | POA: Diagnosis not present

## 2020-02-20 DIAGNOSIS — E119 Type 2 diabetes mellitus without complications: Secondary | ICD-10-CM

## 2020-02-20 DIAGNOSIS — I10 Essential (primary) hypertension: Secondary | ICD-10-CM

## 2020-02-20 DIAGNOSIS — Z961 Presence of intraocular lens: Secondary | ICD-10-CM

## 2020-02-20 DIAGNOSIS — H353112 Nonexudative age-related macular degeneration, right eye, intermediate dry stage: Secondary | ICD-10-CM

## 2020-02-22 ENCOUNTER — Encounter (INDEPENDENT_AMBULATORY_CARE_PROVIDER_SITE_OTHER): Payer: Self-pay | Admitting: Ophthalmology

## 2020-02-22 DIAGNOSIS — H353221 Exudative age-related macular degeneration, left eye, with active choroidal neovascularization: Secondary | ICD-10-CM | POA: Diagnosis not present

## 2020-02-22 DIAGNOSIS — I10 Essential (primary) hypertension: Secondary | ICD-10-CM | POA: Diagnosis not present

## 2020-02-22 MED ORDER — BEVACIZUMAB CHEMO INJECTION 1.25MG/0.05ML SYRINGE FOR KALEIDOSCOPE
1.2500 mg | INTRAVITREAL | Status: AC | PRN
  Administered 2020-02-22: 1.25 mg via INTRAVITREAL

## 2020-02-23 NOTE — Progress Notes (Signed)
Subjective:  Patient ID: Sue [name], female    DOB: 1932/12/02,  MRN: [id number]  87 y.o. female presents with at risk foot care. Patient has h/o amputation of partial amputation of R 2nd toe and callus(es) submet head 2 right foot and painful thick toenails that are difficult to trim. Painful toenails interfere with ambulation. Aggravating factors include wearing enclosed shoe gear. Pain is relieved with periodic professional debridement. Painful calluses are aggravated when weightbearing with and without shoegear. Pain is relieved with periodic professional debridement.  Patient's daughter is present during today's visit. They voice no new pedal concerns on today's visit.  PCP: [name], MD and last visit was: 10/26/2019.  Review of Systems: Negative except as noted in the HPI.  Past Medical History:  Diagnosis Date  . Anemia   . Arthritis   . Basal cell carcinoma 11/23/2017   left elbow, right neck TX CX3 5FU   . Basal cell carcinoma 04/04/2019   infil-right sideburn (CX35FU)  . Chronic kidney disease    stage 3 ckd stage 3 b per dr Nicholos Johns 09-26-2019 note on chart  . Complication of anesthesia    deifficulty waking up  . COVID-19   . Diabetic neuropathy (HCC)   . Hyperlipidemia   . Hypertension   . Hypertensive retinopathy    OU  . Macular degeneration    OU  . Pneumonia   . Right carotid bruit per dr Nicholos Johns 09-26-2019 note on chart  . SCC (squamous cell carcinoma) 11/23/2017   right forehead TX CX3 5FU  . Type 2 DM with CKD stage 3 and hypertension (HCC)    Past Surgical History:  Procedure Laterality Date  . ABDOMINAL HYSTERECTOMY    . AMPUTATION TOE Right 09/28/2019   Procedure: Right 2nd toe amputation - partial;  Surgeon: Park Liter, DPM;  Location: [facility];  Service: Podiatry;  Laterality: Right;  . CATARACT EXTRACTION    . CHOLECYSTECTOMY    . EYE SURGERY    . HERNIA REPAIR    . SPINE SURGERY    . TONSILLECTOMY      Patient Active Problem List   Diagnosis Date Noted  . Carotid bruit 02/19/2020  . Electrocardiogram abnormal 02/19/2020  . Foot callus 02/19/2020  . Hyperglycemia due to type 2 diabetes mellitus (HCC) 02/19/2020  . Iron deficiency anemia secondary to inadequate dietary iron intake 02/19/2020  . Adult general medical exam 02/19/2020  . Pyogenic inflammation of bone (HCC)   . Gastroenteritis due to COVID-19 virus 12/21/2018  . Acute renal failure superimposed on stage 3b chronic kidney disease (HCC) 12/21/2018  . Diabetes mellitus type 2, uncontrolled, with complications (HCC) 12/20/2018  . HLD (hyperlipidemia) 12/20/2018  . Anxiety 12/20/2018  . Depression 12/20/2018  . Acute respiratory disease due to COVID-19 virus 12/17/2018  . Dehydration 12/17/2018  . Essential hypertension 12/17/2018  . Type 1 diabetes mellitus without complication (HCC) 12/17/2018  . Diabetic ulcer of foot associated with diabetes mellitus due to underlying condition, limited to breakdown of skin (HCC) 01/11/2018  . Right hip pain 04/15/2016  . Right buttock pain 04/15/2016  . Primary osteoarthritis of right hip 04/15/2016    Current Outpatient Medications:  .  amLODipine (NORVASC) 10 MG tablet, Take 10 mg by mouth every morning. , Disp: , Rfl:  .  B-D ULTRAFINE III SHORT PEN 31G X 8 MM MISC, USE AS DIRECTED ONCE DAILY SUBCUTANEOUSLY, Disp: , Rfl: 11 .  calcium-vitamin D (OSCAL WITH D) 500-200 MG-UNIT tablet, Take  1 tablet by mouth 2 (two) times daily. , Disp: , Rfl:  .  cephALEXin (KEFLEX) 500 MG capsule, Take 1 capsule (500 mg total) by mouth 2 (two) times daily. (Patient not taking: Reported on 02/20/2020), Disp: 14 capsule, Rfl: 0 .  diphenhydramine-acetaminophen (TYLENOL PM) 25-500 MG TABS tablet, Take 1 tablet by mouth at bedtime as needed (sleep)., Disp: , Rfl:  .  glucose blood test strip, OneTouch Ultra Test strips  TEST ONCE A DAY ONCE A DAY FINGERSTICK 90, Disp: , Rfl:  .  hydrALAZINE (APRESOLINE)  25 MG tablet, Take 3 tablets (75 mg total) by mouth every 8 (eight) hours., Disp: 270 tablet, Rfl: 0 .  hydrochlorothiazide (HYDRODIURIL) 25 MG tablet, Take 25 mg by mouth daily., Disp: , Rfl:  .  HYDROcodone-acetaminophen (NORCO/VICODIN) 5-325 MG tablet, Take 1 tablet by mouth every 4 (four) hours as needed for moderate pain., Disp: 20 tablet, Rfl: 0 .  ibuprofen (ADVIL) 200 MG tablet, Take 400 mg by mouth every 6 (six) hours as needed for moderate pain., Disp: , Rfl:  .  Insulin Degludec (TRESIBA FLEXTOUCH) 200 UNIT/ML SOPN, Inject 36 Units into the skin at bedtime. (Patient taking differently: Inject 22 Units into the skin at bedtime.), Disp: 3 pen, Rfl: 0 .  Insulin Pen Needle (PEN NEEDLES) 32G X 4 MM MISC, BD Ultra-Fine Nano Pen Needle 32 gauge x 5/32"  USE AS DIRECTED DAILY, Disp: , Rfl:  .  JANUVIA 25 MG tablet, Take 25 mg by mouth daily., Disp: , Rfl:  .  ketoconazole (NIZORAL) 2 % cream, Apply topically., Disp: , Rfl:  .  Lancets (ONETOUCH ULTRASOFT) lancets, OneTouch UltraSoft Lancets  USE TWICE DAILY, Disp: , Rfl:  .  Melatonin 10 MG CAPS, Take 10 mg by mouth at bedtime as needed (sleep)., Disp: , Rfl:  .  meloxicam (MOBIC) 7.5 MG tablet, Take 1 tablet (7.5 mg total) by mouth daily as needed for pain. (Patient not taking: No sig reported), Disp: 30 tablet, Rfl: 3 .  Multiple Vitamins-Minerals (ADULT ONE DAILY GUMMIES) CHEW, Chew 1 tablet by mouth 2 (two) times daily. Centrum, Disp: , Rfl:  .  Multiple Vitamins-Minerals (AIRBORNE) TBEF, Take 1 tablet by mouth daily as needed (immune support). , Disp: , Rfl:  .  Multiple Vitamins-Minerals (PRESERVISION AREDS 2) CAPS, Take 1 capsule by mouth 2 (two) times daily., Disp: , Rfl:  .  mupirocin ointment (BACTROBAN) 2 %, Apply to right foot ulcer once daily. (Patient taking differently: Apply 1 application topically daily as needed (wound care). Apply to right foot ulcer once daily.), Disp: 30 g, Rfl: 1 .  nortriptyline (PAMELOR) 25 MG capsule, Take  1 capsule (25 mg total) by mouth at bedtime. (Patient taking differently: Take 25 mg by mouth 2 (two) times daily.), Disp: 30 capsule, Rfl: 0 .  Omega-3 Fatty Acids (FISH OIL) 1200 MG CAPS, Take 1,200 mg by mouth daily with lunch. , Disp: , Rfl:  .  omeprazole (PRILOSEC) 20 MG capsule, Take 20 mg by mouth every morning., Disp: , Rfl:  .  ONE TOUCH ULTRA TEST test strip, , Disp: , Rfl:  .  Polyethyl Glycol-Propyl Glycol (SYSTANE) 0.4-0.3 % SOLN, Place 1 drop into both eyes 3 (three) times daily as needed (dry eyes). (Patient not taking: Reported on 02/20/2020), Disp: , Rfl:  .  PRESCRIPTION MEDICATION, by Intravitreal route every 30 (thirty) days. Ophthalmic injection at MD office in left eye, Disp: , Rfl:  .  RESTASIS 0.05 % ophthalmic emulsion, Place 1  drop into both eyes 2 (two) times daily. , Disp: , Rfl:  .  silver sulfADIAZINE (SILVADENE) 1 % cream, Apply pea-sized amount to wound daily. (Patient taking differently: Apply 1 application topically daily. Apply pea-sized amount to wound daily.), Disp: 50 g, Rfl: 0 .  simvastatin (ZOCOR) 20 MG tablet, Take 20 mg by mouth at bedtime. , Disp: , Rfl:  .  traMADol (ULTRAM) 50 MG tablet, Take 1 tablet (50 mg total) by mouth 2 (two) times daily as needed., Disp: 20 tablet, Rfl: 0 .  vitamin B-12 (CYANOCOBALAMIN) 1000 MCG tablet, Take 1,000 mcg by mouth 2 (two) times daily. , Disp: , Rfl:   Current Facility-Administered Medications:  .  Bevacizumab (AVASTIN) SOLN 1.25 mg, 1.25 mg, Intravitreal, , Bernarda Caffey, MD, 1.25 mg at 03/01/18 1344 .  Bevacizumab (AVASTIN) SOLN 1.25 mg, 1.25 mg, Intravitreal, , Bernarda Caffey, MD, 1.25 mg at 03/31/18 1449 No Known Allergies Social History   Tobacco Use  Smoking Status Former Smoker  Smokeless Tobacco Never Used    Objective:  There were no vitals filed for this visit. Constitutional Patient is a pleasant 87 y.o. Caucasian female in NAD. AAO x 3.  Vascular Capillary refill time to digits immediate b/l.  Palpable pedal pulses b/l LE. Pedal hair absent. Lower extremity skin temperature gradient within normal limits. No cyanosis or clubbing noted.  Neurologic Normal speech. Protective sensation diminished with 10g monofilament b/l. Vibratory sensation diminished b/l.  Dermatologic Pedal skin with normal turgor, texture and tone bilaterally. No open wounds bilaterally. No interdigital macerations bilaterally. Toenails 1-5 left, R hallux, R 3rd toe, R 4th toe and R 5th toe elongated, discolored, dystrophic, thickened, and crumbly with subungual debris and tenderness to dorsal palpation. Hyperkeratotic lesion(s) submet head 2 right foot.  No erythema, no edema, no drainage, no flocculence.  Orthopedic: Normal muscle strength 5/5 to all lower extremity muscle groups bilaterally. No pain crepitus or joint limitation noted with ROM b/l. Hammertoes noted to the R 2nd toe. 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 Utilizes cane for ambulation assistance.   Hemoglobin A1C Latest Ref Rng & Units 09/21/2019  HGBA1C 4.8 - 5.6 % 8.8(H)  Some recent data might be hidden   Assessment:   1. Pain due to onychomycosis of toenails of both feet   2. Callus   3. History of partial amputation of toe of right foot ([place])   4. Diabetic peripheral neuropathy associated with type 2 diabetes mellitus ([place])    Plan:  Patient was evaluated and treated and all questions answered.  Onychomycosis with pain -Nails palliatively debridement as below. -Educated on self-care  Procedure: Nail Debridement Rationale: Pain Type of Debridement: manual, sharp debridement. Instrumentation: Nail nipper, rotary burr. Number of Nails: 9  -Examined patient. -Continue diabetic foot care principles. -Toenails 1-5 left, R hallux, R 3rd toe, R 4th toe and R 5th toe debrided in length and girth without iatrogenic bleeding with sterile nail nipper and dremel.  -Callus(es) submet head 2 right foot pared utilizing sterile  scalpel blade without complication or incident. Total number debrided =1. -Patient to report any pedal injuries to medical professional immediately. -Patient to continue soft, supportive shoe gear daily. -Patient/POA to call should there be question/concern in the interim.  Return in about 3 months (around 05/19/2020).  Marzetta Board, DPM

## 2020-03-11 DIAGNOSIS — M25532 Pain in left wrist: Secondary | ICD-10-CM | POA: Diagnosis not present

## 2020-03-11 DIAGNOSIS — E782 Mixed hyperlipidemia: Secondary | ICD-10-CM | POA: Diagnosis not present

## 2020-03-11 DIAGNOSIS — M7989 Other specified soft tissue disorders: Secondary | ICD-10-CM | POA: Diagnosis not present

## 2020-03-11 DIAGNOSIS — I129 Hypertensive chronic kidney disease with stage 1 through stage 4 chronic kidney disease, or unspecified chronic kidney disease: Secondary | ICD-10-CM | POA: Diagnosis not present

## 2020-03-11 DIAGNOSIS — E1165 Type 2 diabetes mellitus with hyperglycemia: Secondary | ICD-10-CM | POA: Diagnosis not present

## 2020-03-13 DIAGNOSIS — S52502A Unspecified fracture of the lower end of left radius, initial encounter for closed fracture: Secondary | ICD-10-CM | POA: Insufficient documentation

## 2020-04-03 DIAGNOSIS — S52502D Unspecified fracture of the lower end of left radius, subsequent encounter for closed fracture with routine healing: Secondary | ICD-10-CM | POA: Diagnosis not present

## 2020-04-22 ENCOUNTER — Other Ambulatory Visit: Payer: Self-pay

## 2020-04-22 ENCOUNTER — Ambulatory Visit: Payer: Medicare Other | Admitting: Dermatology

## 2020-04-22 ENCOUNTER — Encounter: Payer: Self-pay | Admitting: Dermatology

## 2020-04-22 DIAGNOSIS — C4441 Basal cell carcinoma of skin of scalp and neck: Secondary | ICD-10-CM

## 2020-04-22 DIAGNOSIS — Z85828 Personal history of other malignant neoplasm of skin: Secondary | ICD-10-CM | POA: Diagnosis not present

## 2020-04-22 DIAGNOSIS — L720 Epidermal cyst: Secondary | ICD-10-CM | POA: Diagnosis not present

## 2020-04-22 DIAGNOSIS — Z1283 Encounter for screening for malignant neoplasm of skin: Secondary | ICD-10-CM | POA: Diagnosis not present

## 2020-04-22 DIAGNOSIS — D044 Carcinoma in situ of skin of scalp and neck: Secondary | ICD-10-CM | POA: Diagnosis not present

## 2020-04-22 DIAGNOSIS — D0461 Carcinoma in situ of skin of right upper limb, including shoulder: Secondary | ICD-10-CM | POA: Diagnosis not present

## 2020-04-22 DIAGNOSIS — C4491 Basal cell carcinoma of skin, unspecified: Secondary | ICD-10-CM

## 2020-04-22 DIAGNOSIS — Z86007 Personal history of in-situ neoplasm of skin: Secondary | ICD-10-CM

## 2020-04-22 DIAGNOSIS — C4492 Squamous cell carcinoma of skin, unspecified: Secondary | ICD-10-CM

## 2020-04-22 DIAGNOSIS — C44319 Basal cell carcinoma of skin of other parts of face: Secondary | ICD-10-CM | POA: Diagnosis not present

## 2020-04-22 DIAGNOSIS — D485 Neoplasm of uncertain behavior of skin: Secondary | ICD-10-CM

## 2020-04-22 HISTORY — DX: Squamous cell carcinoma of skin, unspecified: C44.92

## 2020-04-22 HISTORY — DX: Basal cell carcinoma of skin, unspecified: C44.91

## 2020-04-22 NOTE — Patient Instructions (Addendum)

## 2020-04-23 DIAGNOSIS — M25532 Pain in left wrist: Secondary | ICD-10-CM | POA: Diagnosis not present

## 2020-04-23 DIAGNOSIS — S52502D Unspecified fracture of the lower end of left radius, subsequent encounter for closed fracture with routine healing: Secondary | ICD-10-CM | POA: Diagnosis not present

## 2020-04-29 DIAGNOSIS — H905 Unspecified sensorineural hearing loss: Secondary | ICD-10-CM | POA: Diagnosis not present

## 2020-04-30 ENCOUNTER — Encounter (INDEPENDENT_AMBULATORY_CARE_PROVIDER_SITE_OTHER): Payer: Self-pay | Admitting: Ophthalmology

## 2020-04-30 ENCOUNTER — Ambulatory Visit (INDEPENDENT_AMBULATORY_CARE_PROVIDER_SITE_OTHER): Payer: Medicare Other | Admitting: Ophthalmology

## 2020-04-30 ENCOUNTER — Other Ambulatory Visit: Payer: Self-pay

## 2020-04-30 DIAGNOSIS — H3581 Retinal edema: Secondary | ICD-10-CM

## 2020-04-30 DIAGNOSIS — H353221 Exudative age-related macular degeneration, left eye, with active choroidal neovascularization: Secondary | ICD-10-CM | POA: Diagnosis not present

## 2020-04-30 DIAGNOSIS — Z961 Presence of intraocular lens: Secondary | ICD-10-CM

## 2020-04-30 DIAGNOSIS — H353112 Nonexudative age-related macular degeneration, right eye, intermediate dry stage: Secondary | ICD-10-CM | POA: Diagnosis not present

## 2020-04-30 DIAGNOSIS — H35033 Hypertensive retinopathy, bilateral: Secondary | ICD-10-CM | POA: Diagnosis not present

## 2020-04-30 DIAGNOSIS — E119 Type 2 diabetes mellitus without complications: Secondary | ICD-10-CM | POA: Diagnosis not present

## 2020-04-30 DIAGNOSIS — I10 Essential (primary) hypertension: Secondary | ICD-10-CM | POA: Diagnosis not present

## 2020-04-30 MED ORDER — BEVACIZUMAB CHEMO INJECTION 1.25MG/0.05ML SYRINGE FOR KALEIDOSCOPE
1.2500 mg | INTRAVITREAL | Status: AC | PRN
  Administered 2020-04-30: 1.25 mg via INTRAVITREAL

## 2020-04-30 NOTE — Progress Notes (Signed)
[facility] Note  04/30/2020   CHIEF COMPLAINT Patient presents for Retina Follow Up  HISTORY OF PRESENT ILLNESS: Sue [name] is a 87 y.o. female who presents to the clinic today for:   HPI    Retina Follow Up    Patient presents with  Wet AMD.  In left eye.  This started years ago.  Severity is moderate.  Duration of 10 weeks.  Since onset it is stable.  I, the attending physician,  performed the HPI with the patient and updated documentation appropriately.          Comments    87 y/o female pt here for 10 wk f/u for exu ARMD OS.  No change in [place] OU.  Denies pain, FOL, floaters.  No gtts (recently ran out of Restasis).  BS 111 this a.m.  A1C unknown.       Last edited by Bernarda Caffey, MD on 04/30/2020  9:49 PM. (History)    pt states   Referring physician: Merrilee Seashore, [address] [place],  [place] [address]  HISTORICAL INFORMATION:   Selected notes from the MEDICAL RECORD NUMBER Referred by Dr. Quentin Ore for concern of ARMD OU    CURRENT MEDICATIONS: Current Outpatient Medications (Ophthalmic Drugs)  Medication Sig  . Polyethyl Glycol-Propyl Glycol (SYSTANE) 0.4-0.3 % SOLN Place 1 drop into both eyes 3 (three) times daily as needed (dry eyes).  . RESTASIS 0.05 % ophthalmic emulsion Place 1 drop into both eyes 2 (two) times daily.  (Patient not taking: Reported on 04/30/2020)   No current facility-administered medications for this visit. (Ophthalmic Drugs)   Current Outpatient Medications (Other)  Medication Sig  . amLODipine (NORVASC) 10 MG tablet Take 10 mg by mouth every morning.   . B-D ULTRAFINE III SHORT PEN 31G X 8 MM MISC USE AS DIRECTED ONCE DAILY SUBCUTANEOUSLY  . calcium-vitamin D (OSCAL WITH D) 500-200 MG-UNIT tablet Take 1 tablet by mouth 2 (two) times daily.   . cephALEXin (KEFLEX) 500 MG capsule Take 1 capsule (500 mg total) by mouth 2 (two) times daily.  . diphenhydramine-acetaminophen (TYLENOL  PM) 25-500 MG TABS tablet Take 1 tablet by mouth at bedtime as needed (sleep).  Marland Kitchen glucose blood test strip OneTouch Ultra Test strips  TEST ONCE A DAY ONCE A DAY FINGERSTICK 90  . hydrALAZINE (APRESOLINE) 25 MG tablet Take 3 tablets (75 mg total) by mouth every 8 (eight) hours.  . hydrochlorothiazide (HYDRODIURIL) 25 MG tablet Take 25 mg by mouth daily.  Marland Kitchen HYDROcodone-acetaminophen (NORCO/VICODIN) 5-325 MG tablet Take 1 tablet by mouth every 4 (four) hours as needed for moderate pain.  Marland Kitchen ibuprofen (ADVIL) 200 MG tablet Take 400 mg by mouth every 6 (six) hours as needed for moderate pain.  . Insulin Degludec (TRESIBA FLEXTOUCH) 200 UNIT/ML SOPN Inject 36 Units into the skin at bedtime. (Patient taking differently: Inject 22 Units into the skin at bedtime.)  . Insulin Pen Needle (PEN NEEDLES) 32G X 4 MM MISC BD Ultra-Fine Nano Pen Needle 32 gauge x 5/32"  USE AS DIRECTED DAILY  . JANUVIA 25 MG tablet Take 25 mg by mouth daily.  Marland Kitchen ketoconazole (NIZORAL) 2 % cream Apply topically.  . Lancets (ONETOUCH ULTRASOFT) lancets OneTouch UltraSoft Lancets  USE TWICE DAILY  . Melatonin 10 MG CAPS Take 10 mg by mouth at bedtime as needed (sleep).  . meloxicam (MOBIC) 7.5 MG tablet Take 1 tablet (7.5 mg total) by mouth daily as  needed for pain.  . Multiple Vitamins-Minerals (ADULT ONE DAILY GUMMIES) CHEW Chew 1 tablet by mouth 2 (two) times daily. Centrum  . Multiple Vitamins-Minerals (AIRBORNE) TBEF Take 1 tablet by mouth daily as needed (immune support).   . Multiple Vitamins-Minerals (PRESERVISION AREDS 2) CAPS Take 1 capsule by mouth 2 (two) times daily.  . mupirocin ointment (BACTROBAN) 2 % Apply to right foot ulcer once daily. (Patient taking differently: Apply 1 application topically daily as needed (wound care). Apply to right foot ulcer once daily.)  . nortriptyline (PAMELOR) 25 MG capsule Take 1 capsule (25 mg total) by mouth at bedtime. (Patient taking differently: Take 25 mg by mouth 2 (two) times  daily.)  . Omega-3 Fatty Acids (FISH OIL) 1200 MG CAPS Take 1,200 mg by mouth daily with lunch.   Marland Kitchen omeprazole (PRILOSEC) 20 MG capsule Take 20 mg by mouth every morning.  . ONE TOUCH ULTRA TEST test strip   . PRESCRIPTION MEDICATION by Intravitreal route every 30 (thirty) days. Ophthalmic injection at MD office in left eye  . silver sulfADIAZINE (SILVADENE) 1 % cream Apply pea-sized amount to wound daily. (Patient taking differently: Apply 1 application topically daily. Apply pea-sized amount to wound daily.)  . simvastatin (ZOCOR) 20 MG tablet Take 20 mg by mouth at bedtime.   . traMADol (ULTRAM) 50 MG tablet Take 1 tablet (50 mg total) by mouth 2 (two) times daily as needed.  . vitamin B-12 (CYANOCOBALAMIN) 1000 MCG tablet Take 1,000 mcg by mouth 2 (two) times daily.    Current Facility-Administered Medications (Other)  Medication Route  . Bevacizumab (AVASTIN) SOLN 1.25 mg Intravitreal  . Bevacizumab (AVASTIN) SOLN 1.25 mg Intravitreal      REVIEW OF SYSTEMS: ROS    Positive for: Neurological, Musculoskeletal, Endocrine, Eyes   Negative for: Constitutional, Gastrointestinal, Skin, Genitourinary, HENT, Cardiovascular, Respiratory, Psychiatric, Allergic/Imm, Heme/Lymph   Last edited by Matthew Folks, COA on 04/30/2020  2:34 PM. (History)       ALLERGIES No Known Allergies  PAST MEDICAL HISTORY Past Medical History:  Diagnosis Date  . Anemia   . Arthritis   . Basal cell carcinoma 11/23/2017   left elbow, right neck TX CX3 5FU   . Basal cell carcinoma 04/04/2019   infil-right sideburn (CX35FU)  . Chronic kidney disease    stage 3 ckd stage 3 b per dr Ashby Dawes 09-26-2019 note on chart  . Complication of anesthesia    deifficulty waking up  . COVID-19   . Diabetic neuropathy ([place])   . Hyperlipidemia   . Hypertension   . Hypertensive retinopathy    OU  . Macular degeneration    OU  . Pneumonia   . Right carotid bruit per dr Ashby Dawes 09-26-2019 note on chart  .  SCC (squamous cell carcinoma) 11/23/2017   right forehead TX CX3 5FU  . Type 2 DM with CKD stage 3 and hypertension ([place])    Past Surgical History:  Procedure Laterality Date  . ABDOMINAL HYSTERECTOMY    . AMPUTATION TOE Right 09/28/2019   Procedure: Right 2nd toe amputation - partial;  Surgeon: Evelina Bucy, DPM;  Location: [facility];  Service: Podiatry;  Laterality: Right;  . CATARACT EXTRACTION    . CHOLECYSTECTOMY    . EYE SURGERY    . HERNIA REPAIR    . SPINE SURGERY    . TONSILLECTOMY      FAMILY HISTORY History reviewed. No pertinent family history.  SOCIAL HISTORY Social History   Tobacco  Use  . Smoking status: Former Smoker  . Smokeless tobacco: Never Used  Vaping Use  . Vaping Use: Never used  Substance Use Topics  . Alcohol use: No    Alcohol/week: 0.0 standard drinks  . Drug use: No         OPHTHALMIC EXAM:  Base Eye Exam    Visual Acuity (Snellen - Linear)      Right Left   Dist cc 20/40 -2 20/50 -2   Dist ph cc NI 20/30 -2   Correction: Glasses       Tonometry (Tonopen, 2:37 PM)      Right Left   Pressure 16 14       Pupils      Dark Light Shape React APD   Right 3 2 Round Minimal None   Left 3 2 Round Minimal None       Visual Fields (Counting fingers)      Left Right    Full Full       Extraocular Movement      Right Left    Full, Ortho Full, Ortho       Neuro/Psych    Oriented x3: Yes   Mood/Affect: Normal       Dilation    Both eyes: 1.0% Mydriacyl, 2.5% Phenylephrine @ 2:37 PM        Slit Lamp and Fundus Exam    Slit Lamp Exam      Right Left   Lids/Lashes Dermatochalasis - upper lid, Telangiectasia, mild MGD Dermatochalasis - upper lid, Telangiectasia, Meibomian gland dysfunction   Conjunctiva/Sclera White and quiet White and quiet   Cornea Temporal Well healed cataract wounds, mild Arcus, trace endo pigment nasally, 1+PEE Arcus, Temporal Well healed cataract wounds, 1-2+ PEE   Anterior Chamber  Deep and quiet Deep and quiet   Iris Round and moderately dilated to 4.56mm Round and moderately dilated to 26mm, Iris atrophy from 0100 to 0230, peripheral irodotimy at 0200   Lens 3-piece PC IOL in good position PC IOL in good position   Vitreous Vitreous syneresis, Posterior vitreous detachment, Weiss ring, vitreous condensations Vitreous syneresis, PVD, vitreous condensations       Fundus Exam      Right Left   Disc 360 Peripapillary atrophy, diffuse mild pallor, compact, sharp rim mild pallor, Peripapillary atrophy and pigmentation   C/D Ratio 0.1 0.2   Macula Blunted foveal reflex, +PED, RPE mottling, clumping and early atrophy, Drusen, No heme or edema Blunted foveal reflex, low lying PED / +CNVM with stable resolution of IRF/SRF, RPE mottling and clumping, Drusen, No heme or edema   Vessels Vascular attenuation Vascular attenuation   Periphery Attached, 360 Reticular degeneration Attached, 360 Reticular degeneration          IMAGING AND PROCEDURES  Imaging and Procedures for @TODAY @  OCT, Retina - OU - Both Eyes       Right Eye Quality was good. Central Foveal Thickness: 236. Progression has been stable. Findings include normal foveal contour, no IRF, no SRF, pigment epithelial detachment, outer retinal atrophy, retinal drusen  (persistent PED).   Left Eye Quality was good. Central Foveal Thickness: 258. Progression has been stable. Findings include no IRF, retinal drusen , pigment epithelial detachment, outer retinal atrophy, epiretinal membrane, abnormal foveal contour, no SRF (stable improvement in IRF overlying low lying PED, partial PVD).   Notes *Images captured and stored on drive  Diagnosis / Impression:  OD: Non-Exudative ARMD -- stable OS: Exudative ARMD --stable improvement  in IRF overlying low lying PED   Clinical management:  See below  Abbreviations: NFP - Normal foveal profile. CME - cystoid macular edema. PED - pigment epithelial detachment. IRF -  intraretinal fluid. SRF - subretinal fluid. EZ - ellipsoid zone. ERM - epiretinal membrane. ORA - outer retinal atrophy. ORT - outer retinal tubulation. SRHM - subretinal hyper-reflective material         Intravitreal Injection, Pharmacologic Agent - OS - Left Eye       Time Out 04/30/2020. 3:01 PM. Confirmed correct patient, procedure, site, and patient consented.   Anesthesia Topical anesthesia was used. Anesthetic medications included Lidocaine 2%, Proparacaine 0.5%.   Procedure Preparation included 5% betadine to ocular surface, eyelid speculum. A supplied (32g) needle was used.   Injection:  1.25 mg Bevacizumab (AVASTIN) 1.25mg /0.71mL SOLN   NDC: H061816, Lot: [id number] , Expiration date: 06/08/2020   Route: Intravitreal, Site: Left Eye, Waste: 0 mL  Post-op Post injection exam found visual acuity of at least counting fingers. The patient tolerated the procedure well. There were no complications. The patient received written and verbal post procedure care education. Post injection medications were not given.                 ASSESSMENT/PLAN:    ICD-10-CM   1. Exudative age-related macular degeneration of left eye with active choroidal neovascularization (HCC)  H35.3221 Intravitreal Injection, Pharmacologic Agent - OS - Left Eye    Bevacizumab (AVASTIN) SOLN 1.25 mg  2. Retinal edema  H35.81 OCT, Retina - OU - Both Eyes  3. 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 Diabetes mellitus type 2 without retinopathy ([place])  E11.9   5. Essential hypertension  I10   6. Hypertensive retinopathy of both eyes  H35.033   7. Pseudophakia of both eyes  Z96.1    1,2. Exudative age-related macular degeneration, left eye  - conversion of OS from nonexudative to exudative ARMD prior to 02.05.20 visit  - s/p IVA OS #1 (02.05.20), #2 (03.04.20), #3 (04.30.20), #4 (06.02.20), #5 (07.14.20), #6 (9.1.20), #7 (10.27.20), #8 (12.23.20),  #9 (02.03.21), #10 (03.17.21), #11 (04.28.21), #12 (06.09.21), #13 (08.04.21), #14 (09.29.21), #15 (11.22.2021), #16 (01.26.22)  - **history of recurrent SRF with extension to 8 wks, noted on 12.23.20 -- stable on 9.30.21 and 11.22.21 at 8 wks**  - OCT shows stable resolution of IRF/SRF overlying PED at 10 wk interval today  - BCVA stable at 20/30   - recommend IVA #17 OS today 04.06.22 -- maintenance w/ ext to 12 wks  - pt wishes to be treated with IVA OS  - risks and benefits of intravitreal avastin discussed with patient  - informed consent obtained  - see procedure note  - Avastin informed consent obtained and scanned 02.03.21  - f/u in 12 wks for DFE, OCT, likely injection OS   3. Age related macular degeneration, non-exudative, right eye  - stable, former pt of Dr. Zigmund Daniel -- no significant change in OCT or exam  - The incidence, anatomy, and pathology of dry AMD, risk of progression, and the AREDS and AREDS 2 study including smoking risks discussed with patient.  - recommend amsler grid monitoring  4. Diabetes mellitus, type 2 without retinopathy  - The incidence, risk factors for progression, natural history and treatment options for diabetic retinopathy  were discussed with patient.    - The need for close monitoring of blood glucose, blood pressure, and serum lipids, avoiding cigarette or any type  of tobacco, and the need for long term follow up was also discussed with patient.  - f/u in 1 year  5,6. Hypertensive retinopathy OU  - discussed importance of tight BP control  - monitor  7. Pseudophakia OU  - s/p CE/IOL OU  - beautiful surgeries, doing well  - monitor  Ophthalmic Meds Ordered this visit:  Meds ordered this encounter  Medications  . Bevacizumab (AVASTIN) SOLN 1.25 mg   Return in about 12 weeks (around 07/23/2020) for f/u exu ARMD OS, DFE, OCT.  There are no Patient Instructions on file for this visit.     This document serves as a record of services  personally performed by Gardiner Sleeper, MD, PhD. It was created on their behalf by Roselee Nova, COMT. The creation of this record is the provider's dictation and/or activities during the visit.  Electronically signed by: Roselee Nova, COMT 04/30/20 9:54 PM   This document serves as a record of services personally performed by Gardiner Sleeper, MD, PhD. It was created on their behalf by San Jetty. Owens Shark, OA an ophthalmic technician. The creation of this record is the provider's dictation and/or activities during the visit.    Electronically signed by: San Jetty. Owens Shark, [place] 04.06.2022 9:54 PM   Gardiner Sleeper, M.D., Ph.D. Diseases & Surgery of the Retina and Vitreous [facility]  I have reviewed the above documentation for accuracy and completeness, and I agree with the above. Gardiner Sleeper, M.D., Ph.D. 04/30/20 9:54 PM  Abbreviations: M myopia (nearsighted); A astigmatism; H hyperopia (farsighted); P presbyopia; Mrx spectacle prescription;  CTL contact lenses; OD right eye; OS left eye; OU both eyes  XT exotropia; ET esotropia; PEK punctate epithelial keratitis; PEE punctate epithelial erosions; DES dry eye syndrome; MGD meibomian gland dysfunction; ATs artificial tears; PFAT's preservative free artificial tears; [place] nuclear sclerotic cataract; PSC posterior subcapsular cataract; ERM epi-retinal membrane; PVD posterior vitreous detachment; RD retinal detachment; DM diabetes mellitus; DR diabetic retinopathy; NPDR non-proliferative diabetic retinopathy; PDR proliferative diabetic retinopathy; CSME clinically significant macular edema; DME diabetic macular edema; dbh dot blot hemorrhages; CWS cotton wool spot; POAG primary open angle glaucoma; C/D cup-to-disc ratio; HVF humphrey visual field; GVF goldmann visual field; OCT optical coherence tomography; IOP intraocular pressure; BRVO Branch retinal vein occlusion; CRVO central retinal vein occlusion; CRAO central retinal artery  occlusion; BRAO branch retinal artery occlusion; RT retinal tear; SB scleral buckle; PPV pars plana vitrectomy; VH Vitreous hemorrhage; PRP panretinal laser photocoagulation; IVK intravitreal kenalog; VMT vitreomacular traction; MH Macular hole;  NVD neovascularization of the disc; NVE neovascularization elsewhere; AREDS age related eye disease study; ARMD age related macular degeneration; POAG primary open angle glaucoma; EBMD epithelial/anterior basement membrane dystrophy; ACIOL anterior chamber intraocular lens; IOL intraocular lens; PCIOL posterior chamber intraocular lens; Phaco/IOL phacoemulsification with intraocular lens placement; [place] photorefractive keratectomy; LASIK laser assisted in situ keratomileusis; HTN hypertension; DM diabetes mellitus; COPD chronic obstructive pulmonary disease

## 2020-05-05 ENCOUNTER — Telehealth: Payer: Self-pay | Admitting: Dermatology

## 2020-05-05 ENCOUNTER — Telehealth: Payer: Self-pay | Admitting: *Deleted

## 2020-05-05 ENCOUNTER — Encounter: Payer: Self-pay | Admitting: Dermatology

## 2020-05-05 NOTE — Telephone Encounter (Signed)
[name] left message on office voice mail on behalf of Sue [name] returning phone call.

## 2020-05-05 NOTE — Telephone Encounter (Signed)
Left message for patient to return our phone call for pathology results.

## 2020-05-05 NOTE — Telephone Encounter (Signed)
Left message to return our phone call.

## 2020-05-05 NOTE — Telephone Encounter (Signed)
-----   Message from Lavonna Monarch, MD sent at 05/02/2020  9:21 AM EDT ----- Please schedule two 30 minutes surgeries with Dr. Darene Lamer.  We will decide at the first visit which ones I will treat.

## 2020-05-05 NOTE — Telephone Encounter (Signed)
error 

## 2020-05-05 NOTE — Telephone Encounter (Signed)
Daughter, Maudie Mercury, called for results; said we wouldn't tell her sister.

## 2020-05-05 NOTE — Progress Notes (Signed)
Follow-Up Visit   Subjective  Sue [name] is a 87 y.o. female who presents for the following: Annual Exam (Patient wants skin check on arms, face, and neck. Lesions on left side of cheek x weeks, comes and goes, scaly. Right side back of neck had been previously taken off, is starting to come back. White lesion on top of right hand that comes and goes. Lesion mid scalp, not tender, per patient just very noticeable.  ).  Multiple new crusts arms, head, neck. Location:  Duration:  Quality:  Associated Signs/Symptoms: Modifying Factors:  Severity:  Timing: Context: History of nonmelanoma skin cancer  Objective  Well appearing patient in no apparent distress; mood and affect are within normal limits. Objective  Right Forehead: No sign recurrence  Objective  Left Elbow, Right neck, Right sideburn: No sign recurrence  Objective  Mid Parietal Scalp: Pink goal 1.8 cm thick crust       Objective  Right Dorsal Hand: Ill-defined 2.2 cm thick crust     Objective  Right Zygomatic Area: Focally eroded 1.1 cm pearly papule     Objective  Right Anterior Neck: Ulcerated 9 mm papule       All sun exposed areas plus back examined.   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  Check as needed change  History of basal cell carcinoma (BCC) (3) Left Elbow; Right sideburn; Right neck  Check as needed change  Neoplasm of uncertain behavior of skin (4) Mid Parietal Scalp  Skin / nail biopsy Type of biopsy: tangential   Informed consent: discussed and consent obtained   Timeout: patient name, date of birth, surgical site, and procedure verified   Procedure prep:  Patient was prepped and draped in usual sterile fashion (Non sterile) Prep type:  Chlorhexidine Anesthesia: the lesion was anesthetized in a standard fashion   Anesthetic:  1% lidocaine w/ epinephrine 1-100,000 local infiltration Instrument used:  flexible razor blade   Outcome: patient tolerated procedure well   Post-procedure details: wound care instructions given    Specimen 1 - Surgical pathology Differential Diagnosis: bcc vs scc  Check Margins: No  Right Dorsal Hand  Skin / nail biopsy Type of biopsy: tangential   Informed consent: discussed and consent obtained   Timeout: patient name, date of birth, surgical site, and procedure verified   Procedure prep:  Patient was prepped and draped in usual sterile fashion (Non sterile) Prep type:  Chlorhexidine Anesthesia: the lesion was anesthetized in a standard fashion   Anesthetic:  1% lidocaine w/ epinephrine 1-100,000 local infiltration Instrument used: flexible razor blade   Outcome: patient tolerated procedure well   Post-procedure details: wound care instructions given    Specimen 2 - Surgical pathology Differential Diagnosis: bcc vs scc  Check Margins: No  Right Zygomatic Area  Skin / nail biopsy Type of biopsy: tangential   Informed consent: discussed and consent obtained   Timeout: patient name, date of birth, surgical site, and procedure verified   Procedure prep:  Patient was prepped and draped in usual sterile fashion (Non sterile) Prep type:  Chlorhexidine Anesthesia: the lesion was anesthetized in a standard fashion   Anesthetic:  1% lidocaine w/ epinephrine 1-100,000 local infiltration Instrument used: flexible razor blade   Outcome: patient tolerated procedure well   Post-procedure details: wound care instructions given    Specimen 3 - Surgical pathology Differential Diagnosis: bcc vs scc  Check Margins: No  Right Anterior Neck  Skin / nail biopsy Type of biopsy: tangential   Informed consent: discussed and consent obtained   Timeout: patient name, date of birth, surgical site, and procedure verified   Procedure prep:  Patient was prepped and draped in usual sterile fashion (Non sterile) Prep type:  Chlorhexidine Anesthesia: the lesion was  anesthetized in a standard fashion   Anesthetic:  1% lidocaine w/ epinephrine 1-100,000 local infiltration Instrument used: flexible razor blade   Outcome: patient tolerated procedure well   Post-procedure details: wound care instructions given    Specimen 4 - Surgical pathology Differential Diagnosis: bcc vs scc  Check Margins: No      I, Lavonna Monarch, MD, have reviewed all documentation for this visit.  The documentation on 05/05/20 for the exam, diagnosis, procedures, and orders are all accurate and complete.

## 2020-05-06 ENCOUNTER — Encounter: Payer: Self-pay | Admitting: Dermatology

## 2020-05-06 NOTE — Telephone Encounter (Signed)
Phone call to patient's daughter Maudie Mercury with patient's pathology results.  Patient's daughter aware.

## 2020-05-07 DIAGNOSIS — E1165 Type 2 diabetes mellitus with hyperglycemia: Secondary | ICD-10-CM | POA: Diagnosis not present

## 2020-05-07 DIAGNOSIS — I129 Hypertensive chronic kidney disease with stage 1 through stage 4 chronic kidney disease, or unspecified chronic kidney disease: Secondary | ICD-10-CM | POA: Diagnosis not present

## 2020-05-07 DIAGNOSIS — E782 Mixed hyperlipidemia: Secondary | ICD-10-CM | POA: Diagnosis not present

## 2020-05-21 ENCOUNTER — Ambulatory Visit: Payer: Medicare Other | Admitting: Dermatology

## 2020-06-02 DIAGNOSIS — S52502D Unspecified fracture of the lower end of left radius, subsequent encounter for closed fracture with routine healing: Secondary | ICD-10-CM | POA: Diagnosis not present

## 2020-06-03 ENCOUNTER — Other Ambulatory Visit: Payer: Self-pay

## 2020-06-03 ENCOUNTER — Encounter: Payer: Self-pay | Admitting: Podiatry

## 2020-06-03 ENCOUNTER — Ambulatory Visit: Payer: Medicare Other | Admitting: Podiatry

## 2020-06-03 DIAGNOSIS — E1142 Type 2 diabetes mellitus with diabetic polyneuropathy: Secondary | ICD-10-CM | POA: Diagnosis not present

## 2020-06-03 DIAGNOSIS — B351 Tinea unguium: Secondary | ICD-10-CM

## 2020-06-03 DIAGNOSIS — M79675 Pain in left toe(s): Secondary | ICD-10-CM | POA: Diagnosis not present

## 2020-06-03 DIAGNOSIS — Z89421 Acquired absence of other right toe(s): Secondary | ICD-10-CM | POA: Diagnosis not present

## 2020-06-03 DIAGNOSIS — M79674 Pain in right toe(s): Secondary | ICD-10-CM

## 2020-06-03 DIAGNOSIS — L84 Corns and callosities: Secondary | ICD-10-CM

## 2020-06-08 NOTE — Progress Notes (Signed)
  Subjective:  Patient ID: Sue [name], female    DOB: 06-06-1932,  MRN: [id number]  Sue [name] presents to clinic today for at risk foot care. Patient has h/o amputation of partial amputation of R 2nd toe and callus(es) right foot and painful thick toenails that are difficult to trim. Painful toenails interfere with ambulation. Aggravating factors include wearing enclosed shoe gear. Pain is relieved with periodic professional debridement. Painful calluses are aggravated when weightbearing with and without shoegear. Pain is relieved with periodic professional debridement.   Her daughter is present during today's visit.  Her blood glucose was "140-something" on today.  PCP is Dr. Roselle Locus, and last visit was 05/07/2020.  No Known Allergies  Review of Systems: Negative except as noted in the HPI. Objective:   Constitutional Sue [name] is a pleasant 87 y.o. Caucasian female, in NAD. AAO x 3.   Vascular Capillary refill time to digits immediate b/l. Palpable pedal pulses b/l LE. Pedal hair absent. Lower extremity skin temperature gradient within normal limits. No cyanosis or clubbing noted.  Neurologic Normal speech. Oriented to person, place, and time. Protective sensation diminished with 10g monofilament b/l. Vibratory sensation diminished b/l.  Dermatologic Pedal skin with normal turgor, texture and tone bilaterally. No open wounds bilaterally. No interdigital macerations bilaterally. Toenails 1-5 left, R hallux, R 3rd toe, R 4th toe and R 5th toe elongated, discolored, dystrophic, thickened, and crumbly with subungual debris and tenderness to dorsal palpation. Hyperkeratotic lesion(s) submet head 2 right foot.  No erythema, no edema, no drainage, no fluctuance.  Orthopedic: Normal muscle strength 5/5 to all lower extremity muscle groups bilaterally. No pain crepitus or joint limitation noted with ROM b/l. Hammertoes noted to the R 2nd toe. Lower extremity amputation(s): partial  amputation of R 2nd toe. Utilizes cane for ambulation assistance.   Radiographs: None Assessment:   1. Pain due to onychomycosis of toenails of both feet   2. Callus   3. History of partial amputation of toe of right foot ([place])   4. Diabetic peripheral neuropathy associated with type 2 diabetes mellitus ([place])    Plan:  Patient was evaluated and treated and all questions answered.  Onychomycosis with pain -Nails palliatively debridement as below -Educated on self-care  Procedure: Nail Debridement Rationale: Pain Type of Debridement: manual, sharp debridement. Instrumentation: Nail nipper, rotary burr. Number of Nails: 9 -Examined patient. -Continue diabetic foot care principles. -Patient to continue soft, supportive shoe gear daily. -Toenails 1-5 left, R hallux, R 3rd toe, R 4th toe and R 5th toe debrided in length and girth without iatrogenic bleeding with sterile nail nipper and dremel.  -Callus(es) submet head 2 right foot pared utilizing sterile scalpel blade without complication or incident. Total number debrided =1. -Patient to report any pedal injuries to medical professional immediately. -Patient/POA to call should there be question/concern in the interim.  Return in about 3 months (around 09/03/2020).  Sue [name], DPM

## 2020-06-26 ENCOUNTER — Encounter: Payer: Medicare Other | Admitting: Dermatology

## 2020-07-22 ENCOUNTER — Other Ambulatory Visit: Payer: Self-pay

## 2020-07-22 ENCOUNTER — Encounter (INDEPENDENT_AMBULATORY_CARE_PROVIDER_SITE_OTHER): Payer: Self-pay | Admitting: Ophthalmology

## 2020-07-22 ENCOUNTER — Ambulatory Visit (INDEPENDENT_AMBULATORY_CARE_PROVIDER_SITE_OTHER): Payer: Medicare Other | Admitting: Ophthalmology

## 2020-07-22 DIAGNOSIS — Z961 Presence of intraocular lens: Secondary | ICD-10-CM

## 2020-07-22 DIAGNOSIS — E119 Type 2 diabetes mellitus without complications: Secondary | ICD-10-CM | POA: Diagnosis not present

## 2020-07-22 DIAGNOSIS — H353221 Exudative age-related macular degeneration, left eye, with active choroidal neovascularization: Secondary | ICD-10-CM | POA: Diagnosis not present

## 2020-07-22 DIAGNOSIS — H35033 Hypertensive retinopathy, bilateral: Secondary | ICD-10-CM | POA: Diagnosis not present

## 2020-07-22 DIAGNOSIS — H353112 Nonexudative age-related macular degeneration, right eye, intermediate dry stage: Secondary | ICD-10-CM

## 2020-07-22 DIAGNOSIS — H3581 Retinal edema: Secondary | ICD-10-CM

## 2020-07-22 DIAGNOSIS — I10 Essential (primary) hypertension: Secondary | ICD-10-CM

## 2020-07-22 MED ORDER — BEVACIZUMAB CHEMO INJECTION 1.25MG/0.05ML SYRINGE FOR KALEIDOSCOPE
1.2500 mg | INTRAVITREAL | Status: AC | PRN
  Administered 2020-07-22: 14:00:00 1.25 mg via INTRAVITREAL

## 2020-07-22 NOTE — Progress Notes (Signed)
[facility] Note  07/22/2020   CHIEF COMPLAINT Patient presents for Retina Follow Up  HISTORY OF PRESENT ILLNESS: Sue [name] is a 87 y.o. female who presents to the clinic today for:  HPI     Retina Follow Up   Patient presents with  Wet AMD.  In left eye.  This started years ago.  Severity is moderate.  Duration of 12 weeks.  Since onset it is stable.  I, the attending physician,  performed the HPI with the patient and updated documentation appropriately.        Comments   87 y/o female pt here for 12 wk f/u for exu ARMD OS.  No change in [place] OU.  Denies pain, FOL, new floaters.  Restasis BID OU.  BS 104 this a.m.  A1C unknown.      Last edited by Bernarda Caffey, MD on 07/22/2020  2:11 PM.       Referring physician: Hortencia Pilar, MD [place],  [place] [address]  HISTORICAL INFORMATION:   Selected notes from the MEDICAL RECORD NUMBER Referred by Dr. Quentin Ore for concern of ARMD OU   CURRENT MEDICATIONS: Current Outpatient Medications (Ophthalmic Drugs)  Medication Sig   Polyethyl Glycol-Propyl Glycol (SYSTANE) 0.4-0.3 % SOLN Place 1 drop into both eyes 3 (three) times daily as needed (dry eyes).   RESTASIS 0.05 % ophthalmic emulsion Place 1 drop into both eyes 2 (two) times daily.  (Patient not taking: Reported on 04/30/2020)   No current facility-administered medications for this visit. (Ophthalmic Drugs)   Current Outpatient Medications (Other)  Medication Sig   amLODipine (NORVASC) 10 MG tablet Take 10 mg by mouth every morning.    B-D ULTRAFINE III SHORT PEN 31G X 8 MM MISC USE AS DIRECTED ONCE DAILY SUBCUTANEOUSLY   calcium-vitamin D (OSCAL WITH D) 500-200 MG-UNIT tablet Take 1 tablet by mouth 2 (two) times daily.    cephALEXin (KEFLEX) 500 MG capsule Take 1 capsule (500 mg total) by mouth 2 (two) times daily.   diphenhydramine-acetaminophen (TYLENOL PM) 25-500 MG TABS tablet Take 1 tablet by mouth at  bedtime as needed (sleep).   glucose blood test strip OneTouch Ultra Test strips  TEST ONCE A DAY ONCE A DAY FINGERSTICK 90   hydrALAZINE (APRESOLINE) 25 MG tablet Take 3 tablets (75 mg total) by mouth every 8 (eight) hours.   hydrochlorothiazide (HYDRODIURIL) 25 MG tablet Take 25 mg by mouth daily.   HYDROcodone-acetaminophen (NORCO/VICODIN) 5-325 MG tablet Take 1 tablet by mouth every 4 (four) hours as needed for moderate pain.   ibuprofen (ADVIL) 200 MG tablet Take 400 mg by mouth every 6 (six) hours as needed for moderate pain.   Insulin Degludec (TRESIBA FLEXTOUCH) 200 UNIT/ML SOPN Inject 36 Units into the skin at bedtime. (Patient taking differently: Inject 22 Units into the skin at bedtime.)   Insulin Pen Needle (PEN NEEDLES) 32G X 4 MM MISC BD Ultra-Fine Nano Pen Needle 32 gauge x 5/32"  USE AS DIRECTED DAILY   JANUVIA 25 MG tablet Take 25 mg by mouth daily.   ketoconazole (NIZORAL) 2 % cream Apply topically.   Lancets (ONETOUCH ULTRASOFT) lancets OneTouch UltraSoft Lancets  USE TWICE DAILY   Melatonin 10 MG CAPS Take 10 mg by mouth at bedtime as needed (sleep).   meloxicam (MOBIC) 7.5 MG tablet Take 1 tablet (7.5 mg total) by mouth daily as needed for pain.   Multiple Vitamins-Minerals (ADULT ONE DAILY  GUMMIES) CHEW Chew 1 tablet by mouth 2 (two) times daily. Centrum   Multiple Vitamins-Minerals (AIRBORNE) TBEF Take 1 tablet by mouth daily as needed (immune support).    Multiple Vitamins-Minerals (PRESERVISION AREDS 2) CAPS Take 1 capsule by mouth 2 (two) times daily.   mupirocin ointment (BACTROBAN) 2 % Apply to right foot ulcer once daily. (Patient taking differently: Apply 1 application topically daily as needed (wound care). Apply to right foot ulcer once daily.)   nortriptyline (PAMELOR) 25 MG capsule Take 1 capsule (25 mg total) by mouth at bedtime. (Patient taking differently: Take 25 mg by mouth 2 (two) times daily.)   Omega-3 Fatty Acids (FISH OIL) 1200 MG CAPS Take 1,200 mg by  mouth daily with lunch.    omeprazole (PRILOSEC) 20 MG capsule Take 20 mg by mouth every morning.   ONE TOUCH ULTRA TEST test strip    PRESCRIPTION MEDICATION by Intravitreal route every 30 (thirty) days. Ophthalmic injection at MD office in left eye   silver sulfADIAZINE (SILVADENE) 1 % cream Apply pea-sized amount to wound daily. (Patient taking differently: Apply 1 application topically daily. Apply pea-sized amount to wound daily.)   simvastatin (ZOCOR) 20 MG tablet Take 20 mg by mouth at bedtime.    traMADol (ULTRAM) 50 MG tablet Take 1 tablet (50 mg total) by mouth 2 (two) times daily as needed.   vitamin B-12 (CYANOCOBALAMIN) 1000 MCG tablet Take 1,000 mcg by mouth 2 (two) times daily.    Current Facility-Administered Medications (Other)  Medication Route   Bevacizumab (AVASTIN) SOLN 1.25 mg Intravitreal   Bevacizumab (AVASTIN) SOLN 1.25 mg Intravitreal      REVIEW OF SYSTEMS: ROS   Positive for: Neurological, Skin, Genitourinary, Musculoskeletal, Endocrine, Eyes Negative for: Constitutional, Gastrointestinal, HENT, Cardiovascular, Respiratory, Psychiatric, Allergic/Imm, Heme/Lymph Last edited by Matthew Folks, COA on 07/22/2020  1:26 PM.        ALLERGIES No Known Allergies  PAST MEDICAL HISTORY Past Medical History:  Diagnosis Date   Anemia    Arthritis    Basal cell carcinoma 11/23/2017   left elbow, right neck TX CX3 5FU    Basal cell carcinoma 04/04/2019   infil-right sideburn (CX35FU)   Chronic kidney disease    stage 3 ckd stage 3 b per dr Ashby Dawes 09-26-2019 note on chart   Complication of anesthesia    deifficulty waking up   COVID-19    Diabetic neuropathy ([place])    Hyperlipidemia    Hypertension    Hypertensive retinopathy    OU   Infiltrative basal cell carcinoma (BCC) 04/22/2020   Right Zygomatic Area    Macular degeneration    OU   Nodular basal cell carcinoma (BCC) 04/22/2020   Right Anterior Neck   Pneumonia    Right carotid bruit per  dr Ashby Dawes 09-26-2019 note on chart   SCC (squamous cell carcinoma) 11/23/2017   right forehead TX CX3 5FU   SCCA (squamous cell carcinoma) of skin 04/22/2020   Mid Parietal Scalp (in situ)   SCCA (squamous cell carcinoma) of skin 04/22/2020   Right Dorsal Hand (in situ)   Type 2 DM with CKD stage 3 and hypertension ([place])    Past Surgical History:  Procedure Laterality Date   ABDOMINAL HYSTERECTOMY     AMPUTATION TOE Right 09/28/2019   Procedure: Right 2nd toe amputation - partial;  Surgeon: Evelina Bucy, DPM;  Location: [facility];  Service: Podiatry;  Laterality: Right;   CATARACT EXTRACTION     CHOLECYSTECTOMY  EYE SURGERY     HERNIA REPAIR     SPINE SURGERY     TONSILLECTOMY      FAMILY HISTORY History reviewed. No pertinent family history.  SOCIAL HISTORY Social History   Tobacco Use   Smoking status: Former    Pack years: 0.00   Smokeless tobacco: Never  Vaping Use   Vaping Use: Never used  Substance Use Topics   Alcohol use: No    Alcohol/week: 0.0 standard drinks   Drug use: No         OPHTHALMIC EXAM:  Base Eye Exam     Visual Acuity (Snellen - Linear)       Right Left   Dist cc 20/30 -2 20/40 -2   Dist ph cc NI 20/30 -2    Correction: Glasses         Tonometry (Tonopen, 1:34 PM)       Right Left   Pressure 15 14         Pupils       Dark Light Shape React APD   Right 3 2 Round Minimal None   Left 3 2 Round Minimal None         Visual Fields (Counting fingers)       Left Right    Full Full         Extraocular Movement       Right Left    Full, Ortho Full, Ortho         Neuro/Psych     Oriented x3: Yes   Mood/Affect: Normal         Dilation     Both eyes: 1.0% Mydriacyl, 2.5% Phenylephrine @ 1:34 PM           Slit Lamp and Fundus Exam     Slit Lamp Exam       Right Left   Lids/Lashes Dermatochalasis - upper lid, Telangiectasia, mild MGD Dermatochalasis - upper lid,  Telangiectasia, Meibomian gland dysfunction   Conjunctiva/Sclera White and quiet White and quiet   Cornea Temporal Well healed cataract wounds, mild Arcus, trace endo pigment nasally, 1+PEE Arcus, Temporal Well healed cataract wounds, 1-2+ PEE   Anterior Chamber Deep and quiet Deep and quiet   Iris Round and moderately dilated to 4.7mm Round and moderately dilated to 66mm, Iris atrophy from 0100 to 0230, peripheral irodotimy at 0200   Lens 3-piece PC IOL in good position PC IOL in good position   Vitreous Vitreous syneresis, Posterior vitreous detachment, Weiss ring, vitreous condensations Vitreous syneresis, PVD, vitreous condensations         Fundus Exam       Right Left   Disc 360 Peripapillary atrophy, diffuse mild pallor, compact, sharp rim mild pallor, Peripapillary atrophy and pigmentation   C/D Ratio 0.1 0.2   Macula Blunted foveal reflex, +PED, RPE mottling, clumping and early atrophy, Drusen, No heme or edema Blunted foveal reflex, low lying PED / +CNVM with stable resolution of IRF, interval re-development of shalow SRF, RPE mottling and clumping, Drusen, No heme    Vessels Vascular attenuation Vascular attenuation   Periphery Attached, 360 Reticular degeneration Attached, 360 Reticular degeneration            IMAGING AND PROCEDURES  Imaging and Procedures for @TODAY @  OCT, Retina - OU - Both Eyes       Right Eye Quality was good. Central Foveal Thickness: 236. Progression has been stable. Findings include normal foveal contour, no IRF, no SRF, pigment  epithelial detachment, outer retinal atrophy, retinal drusen (persistent PED).   Left Eye Quality was good. Central Foveal Thickness: 278. Progression has worsened. Findings include no IRF, retinal drusen , pigment epithelial detachment, outer retinal atrophy, epiretinal membrane, abnormal foveal contour, subretinal fluid (Interval increase in SRF overlying PED, partial PVD).   Notes *Images captured and stored on  drive  Diagnosis / Impression:  OD: Non-Exudative ARMD -- stable OS: Exudative ARMD --  Interval increase in SRF overlying PED, partial PVD  Clinical management:  See below  Abbreviations: NFP - Normal foveal profile. CME - cystoid macular edema. PED - pigment epithelial detachment. IRF - intraretinal fluid. SRF - subretinal fluid. EZ - ellipsoid zone. ERM - epiretinal membrane. ORA - outer retinal atrophy. ORT - outer retinal tubulation. SRHM - subretinal hyper-reflective material       Intravitreal Injection, Pharmacologic Agent - OS - Left Eye       Time Out 07/22/2020. 1:45 PM. Confirmed correct patient, procedure, site, and patient consented.   Anesthesia Topical anesthesia was used. Anesthetic medications included Lidocaine 2%, Proparacaine 0.5%.   Procedure Preparation included 5% betadine to ocular surface, eyelid speculum. A supplied (32g) needle was used.   Injection: 1.25 mg Bevacizumab 1.25mg /0.77ml   Route: Intravitreal, Site: Left Eye   NDC: 60630-160-10, Lot: [id number] , Expiration date: 09/03/2020, Waste: 0 mL   Post-op Post injection exam found visual acuity of at least counting fingers. The patient tolerated the procedure well. There were no complications. The patient received written and verbal post procedure care education. Post injection medications were not given.               ASSESSMENT/PLAN:    ICD-10-CM   1. Exudative age-related macular degeneration of left eye with active choroidal neovascularization (HCC)  H35.3221 Intravitreal Injection, Pharmacologic Agent - OS - Left Eye    Bevacizumab (AVASTIN) SOLN 1.25 mg    2. Retinal edema  H35.81 OCT, Retina - OU - Both Eyes    3. 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 Diabetes mellitus type 2 without retinopathy ([place])  E11.9     5. Essential hypertension  I10     6. Hypertensive retinopathy of both eyes  H35.033     7. Pseudophakia  of both eyes  Z96.1       1,2. Exudative age-related macular degeneration, left eye  - conversion of OS from nonexudative to exudative ARMD prior to 02.05.20 visit  - s/p IVA OS #1 (02.05.20), #2 (03.04.20), #3 (04.30.20), #4 (06.02.20), #5 (07.14.20), #6 (9.1.20), #7 (10.27.20), #8 (12.23.20), #9 (02.03.21), #10 (03.17.21), #11 (04.28.21), #12 (06.09.21), #13 (08.04.21), #14 (09.29.21), #15 (11.22.2021), #16 (01.26.22), #17 (04.06.22)  - **history of recurrent SRF with extension to 8 wks, noted on 12.23.20 -- stable on 9.30.21 and 11.22.21 at 8 wks**  - OCT shows interval increase in SRF overlying PED at 12 wks   - BCVA stable at 20/30   - recommend IVA #18 OS today, 06.28.22 w/ dec interval back to 10 wks  - pt wishes to be treated with IVA OS  - risks and benefits of intravitreal avastin discussed with patient  - informed consent obtained  - see procedure note  - Avastin informed consent obtained and scanned 02.03.21  - f/u in 10 wks for DFE, OCT, likely injection OS   3. Age related macular degeneration, non-exudative, right eye  - stable, former pt of Dr. 15.03.21 -- no significant change in  OCT or exam  - The incidence, anatomy, and pathology of dry AMD, risk of progression, and the AREDS and AREDS 2 study including smoking risks discussed with patient.  - recommend amsler grid monitoring  4. Diabetes mellitus, type 2 without retinopathy  - The incidence, risk factors for progression, natural history and treatment options for diabetic retinopathy  were discussed with patient.    - The need for close monitoring of blood glucose, blood pressure, and serum lipids, avoiding cigarette or any type of tobacco, and the need for long term follow up was also discussed with patient.  - f/u in 1 year  5,6. Hypertensive retinopathy OU  - discussed importance of tight BP control  - monitor  7. Pseudophakia OU  - s/p CE/IOL OU  - beautiful surgeries, doing well  - monitor  Ophthalmic Meds  Ordered this visit:  Meds ordered this encounter  Medications   Bevacizumab (AVASTIN) SOLN 1.25 mg    Return in about 10 weeks (around 09/30/2020).  There are no Patient Instructions on file for this visit.     This document serves as a record of services personally performed by Gardiner Sleeper, MD, PhD. It was created on their behalf by Estill Bakes, COT an ophthalmic technician. The creation of this record is the provider's dictation and/or activities during the visit.    Electronically signed by: Estill Bakes, COT 6.28.22 @ 2:16 PM   This document serves as a record of services personally performed by Gardiner Sleeper, MD, PhD. It was created on their behalf by San Jetty. Owens Shark, OA an ophthalmic technician. The creation of this record is the provider's dictation and/or activities during the visit.    Electronically signed by: San Jetty. Owens Shark, [place] 06.28.2022 2:16 PM  Gardiner Sleeper, M.D., Ph.D. Diseases & Surgery of the Retina and Vitreous [facility]  I have reviewed the above documentation for accuracy and completeness, and I agree with the above. Gardiner Sleeper, M.D., Ph.D. 07/22/20 2:16 PM   Abbreviations: M myopia (nearsighted); A astigmatism; H hyperopia (farsighted); P presbyopia; Mrx spectacle prescription;  CTL contact lenses; OD right eye; OS left eye; OU both eyes  XT exotropia; ET esotropia; PEK punctate epithelial keratitis; PEE punctate epithelial erosions; DES dry eye syndrome; MGD meibomian gland dysfunction; ATs artificial tears; PFAT's preservative free artificial tears; [place] nuclear sclerotic cataract; PSC posterior subcapsular cataract; ERM epi-retinal membrane; PVD posterior vitreous detachment; RD retinal detachment; DM diabetes mellitus; DR diabetic retinopathy; NPDR non-proliferative diabetic retinopathy; PDR proliferative diabetic retinopathy; CSME clinically significant macular edema; DME diabetic macular edema; dbh dot blot hemorrhages;  CWS cotton wool spot; POAG primary open angle glaucoma; C/D cup-to-disc ratio; HVF humphrey visual field; GVF goldmann visual field; OCT optical coherence tomography; IOP intraocular pressure; BRVO Branch retinal vein occlusion; CRVO central retinal vein occlusion; CRAO central retinal artery occlusion; BRAO branch retinal artery occlusion; RT retinal tear; SB scleral buckle; PPV pars plana vitrectomy; VH Vitreous hemorrhage; PRP panretinal laser photocoagulation; IVK intravitreal kenalog; VMT vitreomacular traction; MH Macular hole;  NVD neovascularization of the disc; NVE neovascularization elsewhere; AREDS age related eye disease study; ARMD age related macular degeneration; POAG primary open angle glaucoma; EBMD epithelial/anterior basement membrane dystrophy; ACIOL anterior chamber intraocular lens; IOL intraocular lens; PCIOL posterior chamber intraocular lens; Phaco/IOL phacoemulsification with intraocular lens placement; [place] photorefractive keratectomy; LASIK laser assisted in situ keratomileusis; HTN hypertension; DM diabetes mellitus; COPD chronic obstructive pulmonary disease

## 2020-07-23 ENCOUNTER — Ambulatory Visit: Payer: Medicare Other | Admitting: Dermatology

## 2020-07-23 ENCOUNTER — Encounter (INDEPENDENT_AMBULATORY_CARE_PROVIDER_SITE_OTHER): Payer: Medicare Other | Admitting: Ophthalmology

## 2020-08-04 ENCOUNTER — Ambulatory Visit (INDEPENDENT_AMBULATORY_CARE_PROVIDER_SITE_OTHER): Payer: Medicare Other | Admitting: Dermatology

## 2020-08-04 ENCOUNTER — Other Ambulatory Visit: Payer: Self-pay

## 2020-08-04 ENCOUNTER — Encounter: Payer: Self-pay | Admitting: Dermatology

## 2020-08-04 DIAGNOSIS — Z8589 Personal history of malignant neoplasm of other organs and systems: Secondary | ICD-10-CM

## 2020-08-04 DIAGNOSIS — Z85828 Personal history of other malignant neoplasm of skin: Secondary | ICD-10-CM | POA: Diagnosis not present

## 2020-08-06 ENCOUNTER — Encounter: Payer: Medicare Other | Admitting: Dermatology

## 2020-08-16 ENCOUNTER — Encounter: Payer: Self-pay | Admitting: Dermatology

## 2020-08-16 NOTE — Progress Notes (Signed)
   Follow-Up Visit   Subjective  Sue [name] is a 87 y.o. female who presents for the following: Procedure (Here for treatment of spots.CIS x 2 and BCC x 2 ).  Multiple nonmelanoma skin cancers, may refer for Mohs surgery. Location:  Duration:  Quality:  Associated Signs/Symptoms: Modifying Factors:  Severity:  Timing: Context:   Objective  Well appearing patient in no apparent distress; mood and affect are within normal limits. Chest - Medial (Center) DEFER TREATMENT UNTIL AFTER MOHS 1 HOUR SURGERY TO TREAT THE OTHER 3   Chest - Medial (Center) No sign recurrence    All skin waist up examined.  Areas under the undergarment not fully examined   Assessment & Plan    History of basal cell carcinoma (BCC) Chest - Medial [facility])  Defer surgery.  History of squamous cell carcinoma Chest - Medial [facility])  Recheck as needed change      I, Lavonna Monarch, MD, have reviewed all documentation for this visit.  The documentation on 08/16/20 for the exam, diagnosis, procedures, and orders are all accurate and complete.

## 2020-08-31 IMAGING — CT CT HEAD W/O CM
3 series · 16 of 47 positions shown, 19 images · non-contrast
Comparison: None.

CLINICAL DATA: Bleeding from the right ear after fall

EXAM:
CT HEAD WITHOUT CONTRAST
TECHNIQUE: Contiguous axial images were obtained from the base of the skull
through the vertex without intravenous contrast.

[Series 2: head wo · axial · 0.44mm/px · z∈[+1553,+1688]mm · 10 of 33 slices shown, 13 images]
[im 3/33  brain]
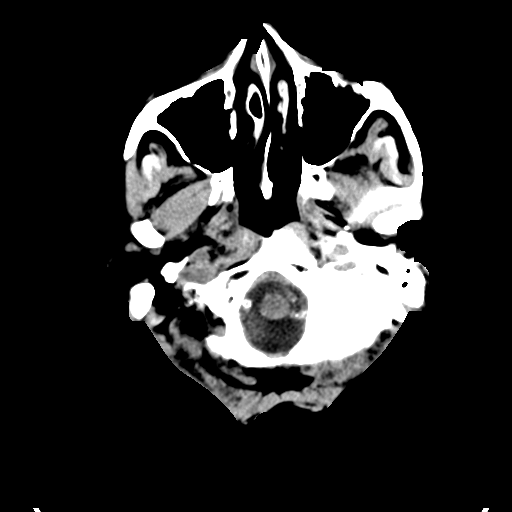
[im 3/33  bone]
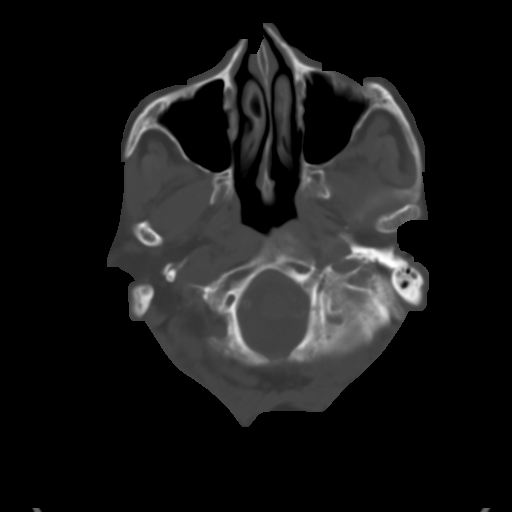
[im 6/33  brain]
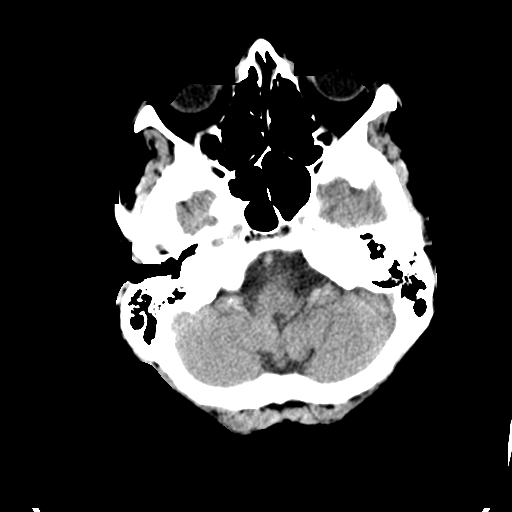
[im 9/33  brain]
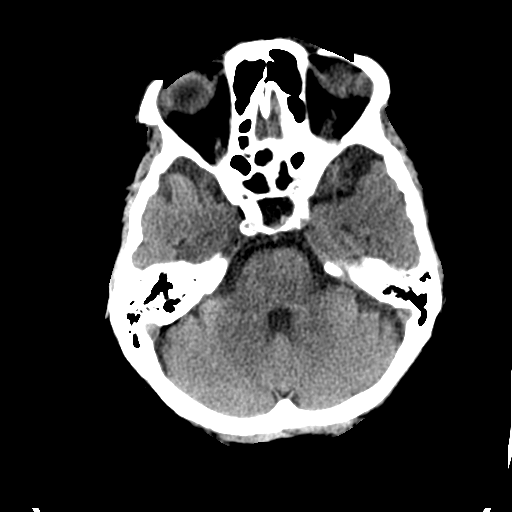
[im 12/33  brain]
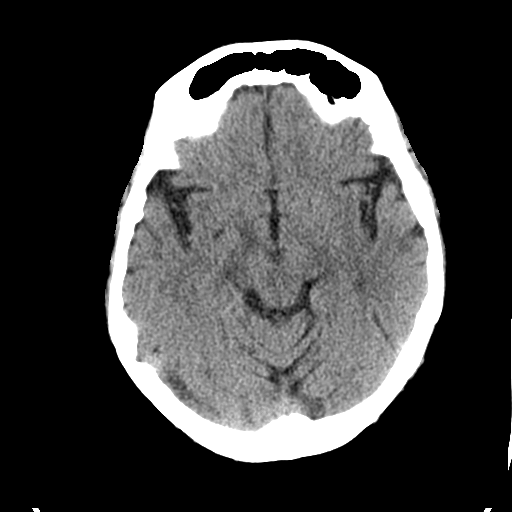
[im 15/33  brain]
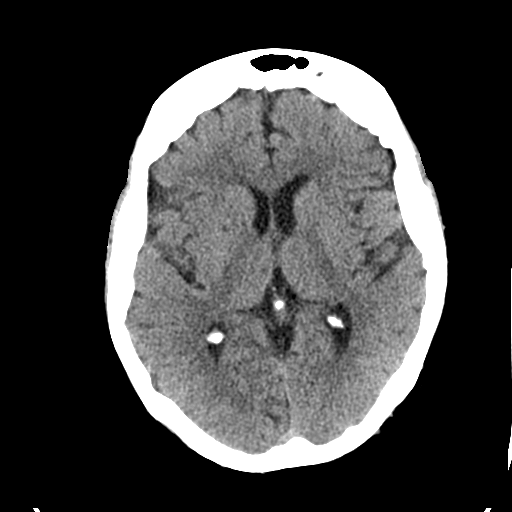
[im 15/33  bone]
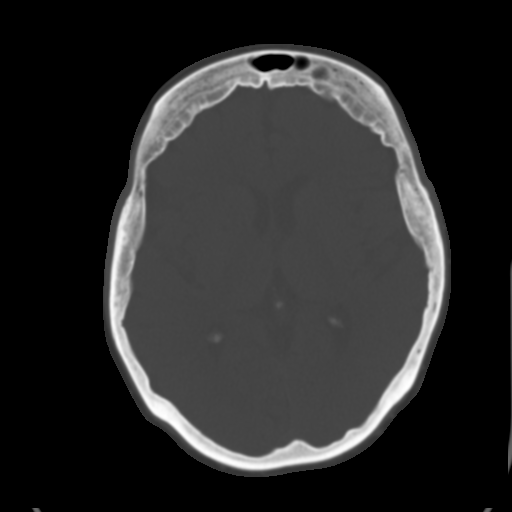
[im 18/33  brain]
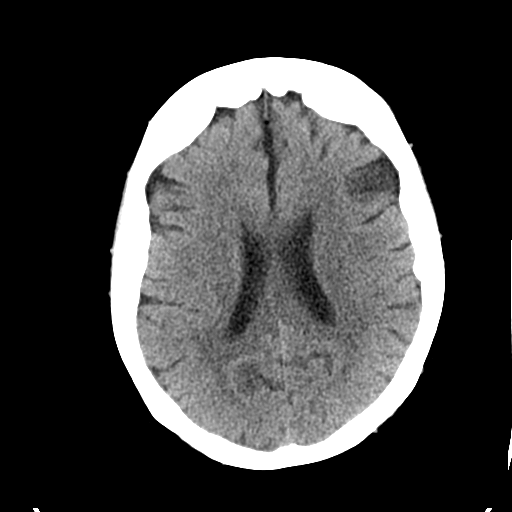
[im 21/33  brain]
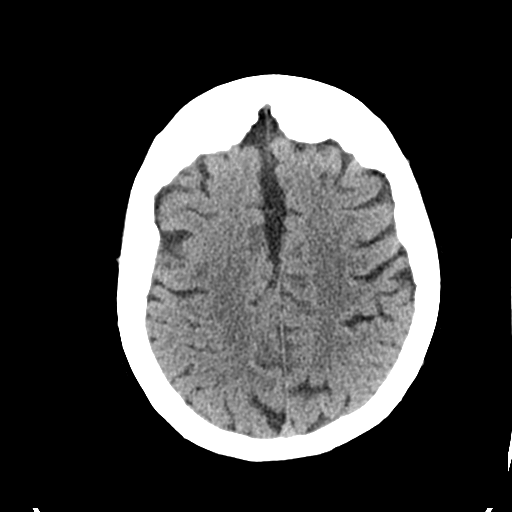
[im 25/33  brain]
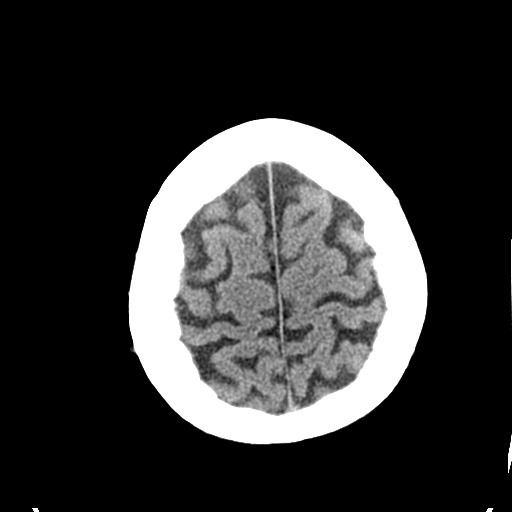
[im 27/33  brain]
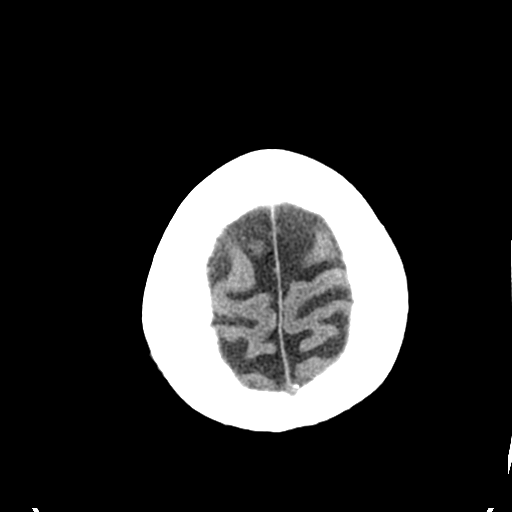
[im 27/33  bone]
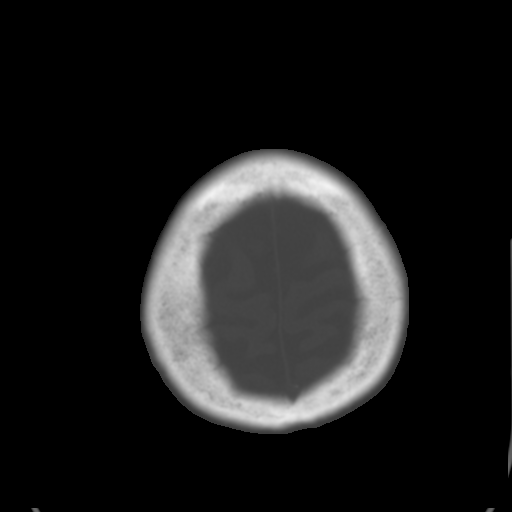
[im 30/33  brain]
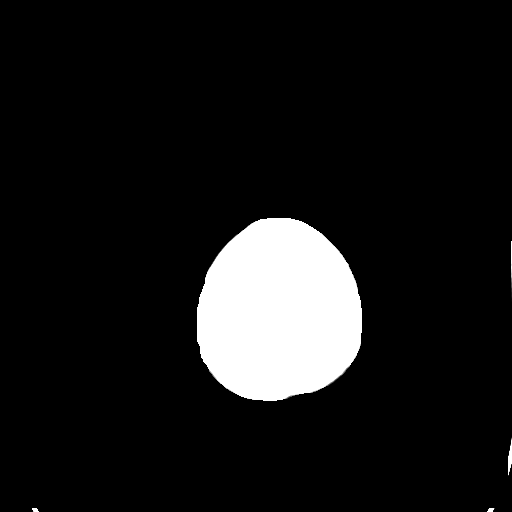

[Series 5: coronal soft tissue · coronal · 0.30mm/px · 3 of 65 slices shown]
[im 22/65  brain]
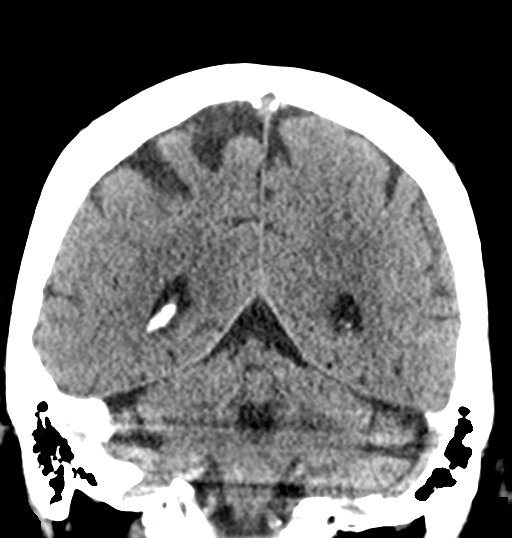
[im 29/65  brain]
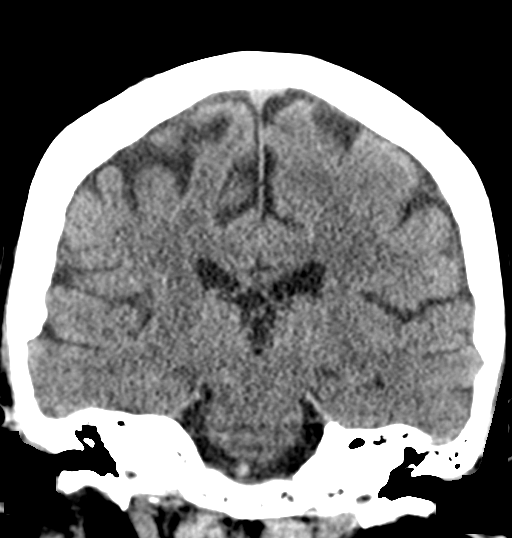
[im 36/65  brain]
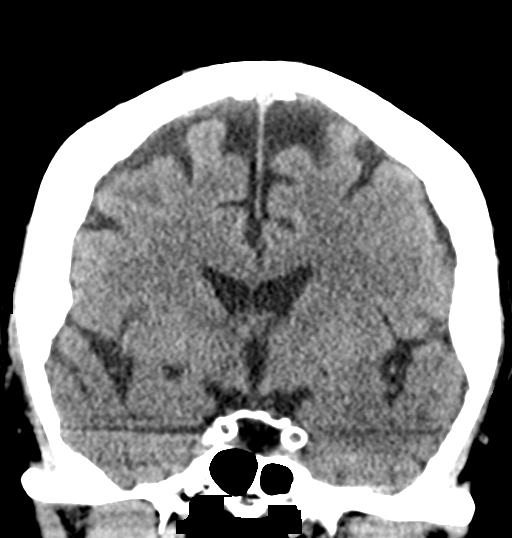

[Series 6: sagittal soft tissue · sagittal · 0.32mm/px · 3 of 52 slices shown]
[im 18/52  brain]
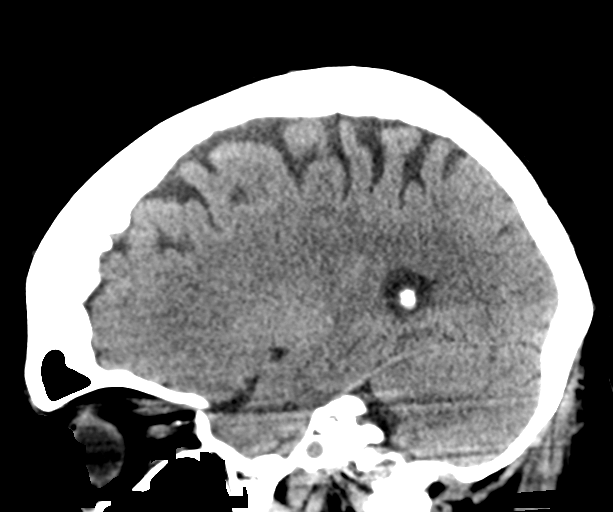
[im 26/52  brain]
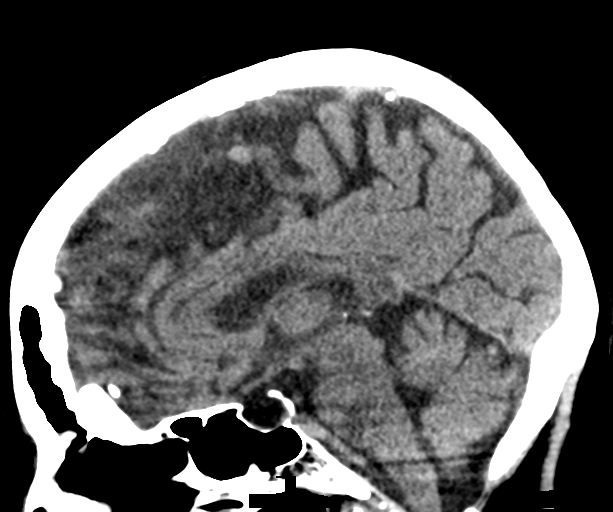
[im 35/52  brain]
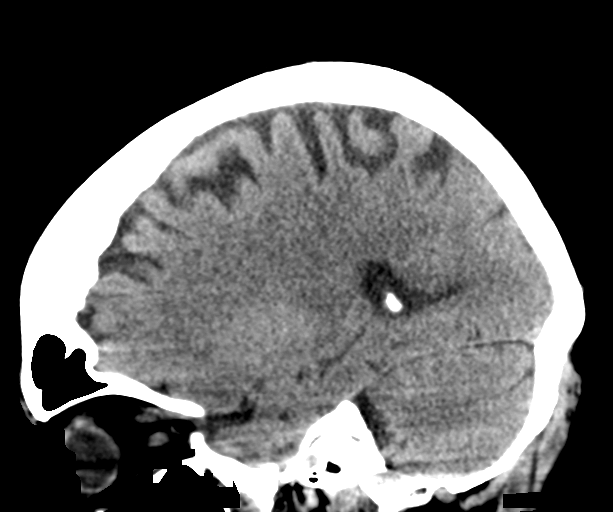

[16 of 47 positions shown; findings below may reference images not displayed]

FINDINGS: Brain: No acute territorial infarction, hemorrhage or intracranial
mass. Moderate-to-marked atrophy. Mild small vessel ischemic changes
of the white matter. Nonenlarged ventricles

Vascular: No hyperdense vessels.  Carotid vascular calcification.

Skull: No depressed skull fracture. Trace fluid in the right mastoid
air cells.

Sinuses/Orbits: No acute finding.

Other: None
IMPRESSION: 1. No CT evidence for acute intracranial abnormality.
2. Atrophy and mild small vessel ischemic changes of the white
matter
3. Trace fluid in the right mastoid air cells. No definitive
fracture lucency is seen.

## 2020-09-03 ENCOUNTER — Encounter: Payer: Self-pay | Admitting: Podiatry

## 2020-09-03 ENCOUNTER — Ambulatory Visit (INDEPENDENT_AMBULATORY_CARE_PROVIDER_SITE_OTHER): Payer: Medicare Other | Admitting: Podiatry

## 2020-09-03 ENCOUNTER — Other Ambulatory Visit: Payer: Self-pay

## 2020-09-03 DIAGNOSIS — I129 Hypertensive chronic kidney disease with stage 1 through stage 4 chronic kidney disease, or unspecified chronic kidney disease: Secondary | ICD-10-CM | POA: Insufficient documentation

## 2020-09-03 DIAGNOSIS — L84 Corns and callosities: Secondary | ICD-10-CM

## 2020-09-03 DIAGNOSIS — M1991 Primary osteoarthritis, unspecified site: Secondary | ICD-10-CM | POA: Insufficient documentation

## 2020-09-03 DIAGNOSIS — E1142 Type 2 diabetes mellitus with diabetic polyneuropathy: Secondary | ICD-10-CM | POA: Diagnosis not present

## 2020-09-03 DIAGNOSIS — R296 Repeated falls: Secondary | ICD-10-CM | POA: Insufficient documentation

## 2020-09-03 DIAGNOSIS — B351 Tinea unguium: Secondary | ICD-10-CM

## 2020-09-03 DIAGNOSIS — M79674 Pain in right toe(s): Secondary | ICD-10-CM

## 2020-09-03 DIAGNOSIS — Z780 Asymptomatic menopausal state: Secondary | ICD-10-CM | POA: Insufficient documentation

## 2020-09-03 DIAGNOSIS — Z89421 Acquired absence of other right toe(s): Secondary | ICD-10-CM

## 2020-09-03 DIAGNOSIS — M79675 Pain in left toe(s): Secondary | ICD-10-CM | POA: Diagnosis not present

## 2020-09-07 NOTE — Progress Notes (Signed)
  Subjective:  Patient ID: Sue [name], female    DOB: January 08, 1933,  MRN: [id number]  Sue [name] presents to clinic today for at risk foot care. Patient has h/o amputation of partial amputation of R 2nd toe, callus(es) right foot and painful thick toenails that are difficult to trim. Painful toenails interfere with ambulation. Aggravating factors include wearing enclosed shoe gear. Pain is relieved with periodic professional debridement. Painful calluses are aggravated when weightbearing with and without shoegear. Pain is relieved with periodic professional debridement.  Patient states blood glucose was 170 mg/dl today..  She voices no new pedal problems on today's visit.  PCP is Sue Seashore, MD , and last visit was 05/07/2020.  No Known Allergies  Review of Systems: Negative except as noted in the HPI. Objective:   Constitutional Sue [name] is a pleasant 87 y.o. Caucasian female, in NAD. AAO x 3.   Vascular Capillary refill time to digits immediate b/l. Palpable DP pulse(s) b/l lower extremities Palpable PT pulse(s) b/l lower extremities Pedal hair absent. Lower extremity skin temperature gradient within normal limits. No cyanosis or clubbing noted.  Neurologic Normal speech. Oriented to person, place, and time. Protective sensation diminished with 10g monofilament b/l. Vibratory sensation diminished b/l.  Dermatologic Pedal skin with normal turgor, texture and tone b/l lower extremities. No open wounds b/l lower extremities. No interdigital macerations b/l lower extremities. Toenails 1-5 left, R hallux, R 3rd toe, R 4th toe, and R 5th toe elongated, discolored, dystrophic, thickened, and crumbly with subungual debris and tenderness to dorsal palpation. Hyperkeratotic lesion(s) submet head 2 right foot.  No erythema, no edema, no drainage, no fluctuance.  Orthopedic: Normal muscle strength 5/5 to all lower extremity muscle groups bilaterally. No pain crepitus or joint  limitation noted with ROM b/l lower extremities. 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   Radiographs: None Assessment:   1. Pain due to onychomycosis of toenails of both feet   2. Callus   3. History of partial amputation of toe of right foot (Sue [name])   4. Diabetic peripheral neuropathy associated with type 2 diabetes mellitus (Sue [name])    Plan:  -Examined patient. -Continue diabetic foot care principles: inspect feet daily, monitor glucose as recommended by PCP and/or Endocrinologist, and follow prescribed diet per PCP, Endocrinologist and/or dietician. -We discussed her wearing her new insoles which are offloaded for her callus. -Patient to continue soft, supportive shoe gear daily. -Toenails 1-5 left, R hallux, R 3rd toe, R 4th toe, and R 5th toe debrided in length and girth without iatrogenic bleeding with sterile nail nipper and dremel.  -Callus(es) submet head 2 right foot pared utilizing sterile scalpel blade without complication or incident. Total number debrided =1. -Patient to report any pedal injuries to medical professional immediately. -Patient/POA to call should there be question/concern in the interim.  Return in about 9 weeks (around 11/05/2020).  Marzetta Board, DPM

## 2020-09-09 ENCOUNTER — Telehealth: Payer: Self-pay | Admitting: Dermatology

## 2020-09-09 NOTE — Telephone Encounter (Signed)
Phone call to patient to see if she's had the [facility] procedure done yet.  Patient states that she hasn't had the [facility] procedure done yet, she's not scheduled for [facility] until September. Patient's appointment cancelled for August 25th.

## 2020-09-09 NOTE — Telephone Encounter (Signed)
Wants to confirm surgery on 8/25 is for place on face, not something else.

## 2020-09-11 ENCOUNTER — Encounter: Payer: Medicare Other | Admitting: Dermatology

## 2020-09-18 ENCOUNTER — Encounter: Payer: Medicare Other | Admitting: Dermatology

## 2020-10-01 ENCOUNTER — Encounter (INDEPENDENT_AMBULATORY_CARE_PROVIDER_SITE_OTHER): Payer: Medicare Other | Admitting: Ophthalmology

## 2020-10-07 ENCOUNTER — Ambulatory Visit (INDEPENDENT_AMBULATORY_CARE_PROVIDER_SITE_OTHER): Payer: Medicare Other | Admitting: Ophthalmology

## 2020-10-07 ENCOUNTER — Encounter (INDEPENDENT_AMBULATORY_CARE_PROVIDER_SITE_OTHER): Payer: Self-pay | Admitting: Ophthalmology

## 2020-10-07 ENCOUNTER — Other Ambulatory Visit: Payer: Self-pay

## 2020-10-07 DIAGNOSIS — H35033 Hypertensive retinopathy, bilateral: Secondary | ICD-10-CM | POA: Diagnosis not present

## 2020-10-07 DIAGNOSIS — H353221 Exudative age-related macular degeneration, left eye, with active choroidal neovascularization: Secondary | ICD-10-CM

## 2020-10-07 DIAGNOSIS — I10 Essential (primary) hypertension: Secondary | ICD-10-CM | POA: Diagnosis not present

## 2020-10-07 DIAGNOSIS — E119 Type 2 diabetes mellitus without complications: Secondary | ICD-10-CM | POA: Diagnosis not present

## 2020-10-07 DIAGNOSIS — E1165 Type 2 diabetes mellitus with hyperglycemia: Secondary | ICD-10-CM | POA: Diagnosis not present

## 2020-10-07 DIAGNOSIS — H353112 Nonexudative age-related macular degeneration, right eye, intermediate dry stage: Secondary | ICD-10-CM

## 2020-10-07 DIAGNOSIS — R5383 Other fatigue: Secondary | ICD-10-CM | POA: Diagnosis not present

## 2020-10-07 DIAGNOSIS — H3581 Retinal edema: Secondary | ICD-10-CM

## 2020-10-07 DIAGNOSIS — Z961 Presence of intraocular lens: Secondary | ICD-10-CM | POA: Diagnosis not present

## 2020-10-07 DIAGNOSIS — I129 Hypertensive chronic kidney disease with stage 1 through stage 4 chronic kidney disease, or unspecified chronic kidney disease: Secondary | ICD-10-CM | POA: Diagnosis not present

## 2020-10-07 DIAGNOSIS — E782 Mixed hyperlipidemia: Secondary | ICD-10-CM | POA: Diagnosis not present

## 2020-10-07 MED ORDER — BEVACIZUMAB CHEMO INJECTION 1.25MG/0.05ML SYRINGE FOR KALEIDOSCOPE
1.2500 mg | INTRAVITREAL | Status: AC | PRN
  Administered 2020-10-07: 1.25 mg via INTRAVITREAL

## 2020-10-07 NOTE — Progress Notes (Signed)
[facility] Note  10/07/2020   CHIEF COMPLAINT Patient presents for Retina Follow Up  HISTORY OF PRESENT ILLNESS: Sue [name] is a 87 y.o. female who presents to the clinic today for:  HPI     Retina Follow Up   Patient presents with  Wet AMD.  Severity is moderate.  Duration of 10 weeks.  Since onset it is stable.  I, the attending physician,  performed the HPI with the patient and updated documentation appropriately.        Comments   10 week Retina follow up for wet armd. Patient states vision is about the same as last visit.      Last edited by Bernarda Caffey, MD on 10/07/2020  4:20 PM.    Pt states eyes have been watery from allergies   Referring physician: Hortencia Pilar, MD [place],  [place] [address]  HISTORICAL INFORMATION:   Selected notes from the MEDICAL RECORD NUMBER Referred by Dr. Quentin Ore for concern of ARMD OU   CURRENT MEDICATIONS: Current Outpatient Medications (Ophthalmic Drugs)  Medication Sig   RESTASIS 0.05 % ophthalmic emulsion Place 1 drop into both eyes 2 (two) times daily.   Polyethyl Glycol-Propyl Glycol (SYSTANE) 0.4-0.3 % SOLN Place 1 drop into both eyes 3 (three) times daily as needed (dry eyes).   No current facility-administered medications for this visit. (Ophthalmic Drugs)   Current Outpatient Medications (Other)  Medication Sig   amLODipine (NORVASC) 10 MG tablet Take 10 mg by mouth every morning.    B-D ULTRAFINE III SHORT PEN 31G X 8 MM MISC USE AS DIRECTED ONCE DAILY SUBCUTANEOUSLY   calcium-vitamin D (OSCAL WITH D) 500-200 MG-UNIT tablet Take 1 tablet by mouth 2 (two) times daily.    cephALEXin (KEFLEX) 500 MG capsule Take 1 capsule (500 mg total) by mouth 2 (two) times daily.   diphenhydramine-acetaminophen (TYLENOL PM) 25-500 MG TABS tablet Take 1 tablet by mouth at bedtime as needed (sleep).   glucose blood test strip OneTouch Ultra Test strips  TEST ONCE A DAY  ONCE A DAY FINGERSTICK 90   hydrALAZINE (APRESOLINE) 25 MG tablet Take 3 tablets (75 mg total) by mouth every 8 (eight) hours.   hydrochlorothiazide (HYDRODIURIL) 25 MG tablet Take 25 mg by mouth daily.   ibuprofen (ADVIL) 200 MG tablet Take 400 mg by mouth every 6 (six) hours as needed for moderate pain.   Insulin Degludec (TRESIBA FLEXTOUCH) 200 UNIT/ML SOPN Inject 36 Units into the skin at bedtime. (Patient taking differently: Inject 22 Units into the skin at bedtime.)   Insulin Pen Needle (PEN NEEDLES) 32G X 4 MM MISC BD Ultra-Fine Nano Pen Needle 32 gauge x 5/32"  USE AS DIRECTED DAILY   JANUVIA 25 MG tablet Take 25 mg by mouth daily.   ketoconazole (NIZORAL) 2 % cream Apply topically.   Lancets (ONETOUCH ULTRASOFT) lancets OneTouch UltraSoft Lancets  USE TWICE DAILY   Melatonin 10 MG CAPS Take 10 mg by mouth at bedtime as needed (sleep).   meloxicam (MOBIC) 7.5 MG tablet Take 1 tablet (7.5 mg total) by mouth daily as needed for pain.   Multiple Vitamins-Minerals (ADULT ONE DAILY GUMMIES) CHEW Chew 1 tablet by mouth 2 (two) times daily. Centrum   Multiple Vitamins-Minerals (AIRBORNE) TBEF Take 1 tablet by mouth daily as needed (immune support).    Multiple Vitamins-Minerals (PRESERVISION AREDS 2) CAPS Take 1 capsule by mouth 2 (two) times daily.   mupirocin  ointment (BACTROBAN) 2 % Apply to right foot ulcer once daily. (Patient taking differently: Apply 1 application topically daily as needed (wound care). Apply to right foot ulcer once daily.)   nortriptyline (PAMELOR) 25 MG capsule Take 1 capsule (25 mg total) by mouth at bedtime. (Patient taking differently: Take 25 mg by mouth 2 (two) times daily.)   Omega-3 Fatty Acids (FISH OIL) 1200 MG CAPS Take 1,200 mg by mouth daily with lunch.    omeprazole (PRILOSEC) 20 MG capsule Take 20 mg by mouth every morning.   ONE TOUCH ULTRA TEST test strip    silver sulfADIAZINE (SILVADENE) 1 % cream Apply pea-sized amount to wound daily. (Patient  taking differently: Apply 1 application topically daily. Apply pea-sized amount to wound daily.)   simvastatin (ZOCOR) 20 MG tablet Take 20 mg by mouth at bedtime.    traMADol (ULTRAM) 50 MG tablet Take 1 tablet (50 mg total) by mouth 2 (two) times daily as needed.   vitamin B-12 (CYANOCOBALAMIN) 1000 MCG tablet Take 1,000 mcg by mouth 2 (two) times daily.    PRESCRIPTION MEDICATION by Intravitreal route every 30 (thirty) days. Ophthalmic injection at MD office in left eye   Current Facility-Administered Medications (Other)  Medication Route   Bevacizumab (AVASTIN) SOLN 1.25 mg Intravitreal   Bevacizumab (AVASTIN) SOLN 1.25 mg Intravitreal   REVIEW OF SYSTEMS: ROS   Positive for: Neurological, Skin, Genitourinary, Musculoskeletal, Endocrine, Eyes Negative for: Constitutional, Gastrointestinal, HENT, Cardiovascular, Respiratory, Psychiatric, Allergic/Imm, Heme/Lymph Last edited by Elmore Guise, COT on 10/07/2020  1:31 PM.     ALLERGIES No Known Allergies  PAST MEDICAL HISTORY Past Medical History:  Diagnosis Date   Anemia    Arthritis    Basal cell carcinoma 11/23/2017   left elbow, right neck TX CX3 5FU    Basal cell carcinoma 04/04/2019   infil-right sideburn (CX35FU)   Chronic kidney disease    stage 3 ckd stage 3 b per dr Ashby Dawes 09-26-2019 note on chart   Complication of anesthesia    deifficulty waking up   COVID-19    Diabetic neuropathy ([place])    Hyperlipidemia    Hypertension    Hypertensive retinopathy    OU   Infiltrative basal cell carcinoma (BCC) 04/22/2020   Right Zygomatic Area    Macular degeneration    OU   Nodular basal cell carcinoma (BCC) 04/22/2020   Right Anterior Neck   Pneumonia    Right carotid bruit per dr Ashby Dawes 09-26-2019 note on chart   SCC (squamous cell carcinoma) 11/23/2017   right forehead TX CX3 5FU   SCCA (squamous cell carcinoma) of skin 04/22/2020   Mid Parietal Scalp (in situ)   SCCA (squamous cell carcinoma) of skin  04/22/2020   Right Dorsal Hand (in situ)   Type 2 DM with CKD stage 3 and hypertension ([place])    Past Surgical History:  Procedure Laterality Date   ABDOMINAL HYSTERECTOMY     AMPUTATION TOE Right 09/28/2019   Procedure: Right 2nd toe amputation - partial;  Surgeon: Evelina Bucy, DPM;  Location: [place];  Service: Podiatry;  Laterality: Right;   CATARACT EXTRACTION     CHOLECYSTECTOMY     EYE SURGERY     HERNIA REPAIR     SPINE SURGERY     TONSILLECTOMY      FAMILY HISTORY History reviewed. No pertinent family history.  SOCIAL HISTORY Social History   Tobacco Use   Smoking status: Former   Smokeless tobacco: Never  Vaping Use   Vaping Use: Never used  Substance Use Topics   Alcohol use: No    Alcohol/week: 0.0 standard drinks   Drug use: No         OPHTHALMIC EXAM:  Base Eye Exam     Visual Acuity (Snellen - Linear)       Right Left   Dist cc 20/30-2 20/30-2   Dist ph cc 20/NI 20/NI    Correction: Glasses         Tonometry (Tonopen, 1:41 PM)       Right Left   Pressure 14 12         Pupils       Dark Light Shape React APD   Right 3 2 Round Brisk None   Left 3 2 Round Brisk None         Visual Fields (Counting fingers)       Left Right    Full Full         Extraocular Movement       Right Left    Full, Ortho Full, Ortho         Neuro/Psych     Oriented x3: Yes   Mood/Affect: Normal         Dilation     Both eyes: 1.0% Mydriacyl, 2.5% Phenylephrine @ 1:41 PM           Slit Lamp and Fundus Exam     Slit Lamp Exam       Right Left   Lids/Lashes Dermatochalasis - upper lid, Telangiectasia, mild MGD Dermatochalasis - upper lid, Telangiectasia, Meibomian gland dysfunction   Conjunctiva/Sclera White and quiet White and quiet   Cornea Temporal Well healed cataract wounds, mild Arcus, trace endo pigment nasally, 1+PEE Arcus, Temporal Well healed cataract wounds, 1-2+ PEE   Anterior Chamber Deep and  quiet Deep and quiet   Iris Round and moderately dilated to 4.30m Round and moderately dilated to 583m Iris atrophy from 0100 to 0230, peripheral irodotimy at 0200   Lens 3-piece PC IOL in good position PC IOL in good position   Vitreous Vitreous syneresis, Posterior vitreous detachment, Weiss ring, vitreous condensations Vitreous syneresis, PVD, vitreous condensations         Fundus Exam       Right Left   Disc 360 Peripapillary atrophy, diffuse mild pallor, compact, sharp rim mild pallor, Peripapillary atrophy and pigmentation   C/D Ratio 0.1 0.2   Macula Blunted foveal reflex, +PED, RPE mottling, clumping and early atrophy, Drusen, No heme or edema Blunted foveal reflex, low lying PED / +CNVM with stable resolution of IRF, interval improvement in SRF, RPE mottling and clumping, Drusen, No heme    Vessels Vascular attenuation Vascular attenuation   Periphery Attached, 360 Reticular degeneration Attached, 360 Reticular degeneration           Refraction     Wearing Rx       Sphere Cylinder Axis   Right -1.50 +0.75 180   Left -0.50 +0.75 180            IMAGING AND PROCEDURES  Imaging and Procedures for '@TODAY'$ @  OCT, Retina - OU - Both Eyes       Right Eye Quality was good. Central Foveal Thickness: 230. Progression has been stable. Findings include normal foveal contour, no IRF, no SRF, pigment epithelial detachment, outer retinal atrophy, retinal drusen (persistent PED).   Left Eye Quality was good. Central Foveal Thickness: 260. Progression has improved. Findings  include no IRF, retinal drusen , pigment epithelial detachment, outer retinal atrophy, epiretinal membrane, abnormal foveal contour, no SRF (Interval improvement in SRF overlying PED -- essentially resolved, partial PVD).   Notes *Images captured and stored on drive  Diagnosis / Impression:  OD: Non-Exudative ARMD -- stable OS: Exudative ARMD -- Interval improvement in SRF overlying PED -- essentially  resolved, partial PVD  Clinical management:  See below  Abbreviations: NFP - Normal foveal profile. CME - cystoid macular edema. PED - pigment epithelial detachment. IRF - intraretinal fluid. SRF - subretinal fluid. EZ - ellipsoid zone. ERM - epiretinal membrane. ORA - outer retinal atrophy. ORT - outer retinal tubulation. SRHM - subretinal hyper-reflective material       Intravitreal Injection, Pharmacologic Agent - OS - Left Eye       Time Out 10/07/2020. 2:38 PM. Confirmed correct patient, procedure, site, and patient consented.   Anesthesia Topical anesthesia was used. Anesthetic medications included Lidocaine 2%, Proparacaine 0.5%.   Procedure Preparation included 5% betadine to ocular surface, eyelid speculum. A (32g) needle was used.   Injection: 1.25 mg Bevacizumab 1.'25mg'$ /0.32m   Route: Intravitreal, Site: Left Eye   NDC: [id number] Lot: [id number], Expiration date: 11/16/2020, Waste: 0.05 mL   Post-op Post injection exam found visual acuity of at least counting fingers. The patient tolerated the procedure well. There were no complications. The patient received written and verbal post procedure care education. Post injection medications were not given.            ASSESSMENT/PLAN:    ICD-10-CM   1. Exudative age-related macular degeneration of left eye with active choroidal neovascularization (HCC)  H35.3221 Intravitreal Injection, Pharmacologic Agent - OS - Left Eye    Bevacizumab (AVASTIN) SOLN 1.25 mg    2. Retinal edema  H35.81 OCT, Retina - OU - Both Eyes    3. 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 Diabetes mellitus type 2 without retinopathy ([place]  E11.9     5. Essential hypertension  I10     6. Hypertensive retinopathy of both eyes  H35.033     7. Pseudophakia of both eyes  Z96.1      1,2. Exudative age-related macular degeneration, left eye  - delayed f/u to 11 wks instead of 10  -  conversion of OS from nonexudative to exudative ARMD prior to 02.05.20 visit  - s/p IVA OS #1 (02.05.20), #2 (03.04.20), #3 (04.30.20), #4 (06.02.20), #5 (07.14.20), #6 (9.1.20), #7 (10.27.20), #8 (12.23.20), #9 (02.03.21), #10 (03.17.21), #11 (04.28.21), #12 (06.09.21), #13 (08.04.21), #14 (09.29.21), #15 (11.22.2021), #16 (01.26.22), #17 (04.06.22), #18 (06.28.22)  - OCT shows Interval improvement in SRF overlying PED -- essentially resolved at 11 wks   - BCVA stable at 20/30   - recommend IVA #19 OS today, 09.13.22 w/ f/u in 11 wks  - pt wishes to be treated with IVA OS  - risks and benefits of intravitreal avastin discussed with patient  - informed consent obtained  - see procedure note  - Avastin informed consent obtained and scanned 02.03.21  - f/u in 11 wks for DFE, OCT, likely injection OS   3. Age related macular degeneration, non-exudative, right eye  - stable, former pt of Dr. MZigmund Daniel-- no significant change in OCT or exam  - The incidence, anatomy, and pathology of dry AMD, risk of progression, and the AREDS and AREDS 2 study including smoking risks discussed with patient.  - recommend amsler grid  monitoring  4. Diabetes mellitus, type 2 without retinopathy  - The incidence, risk factors for progression, natural history and treatment options for diabetic retinopathy  were discussed with patient.    - The need for close monitoring of blood glucose, blood pressure, and serum lipids, avoiding cigarette or any type of tobacco, and the need for long term follow up was also discussed with patient.  - f/u in 1 year  5,6. Hypertensive retinopathy OU  - discussed importance of tight BP control  - monitor  7. Pseudophakia OU  - s/p CE/IOL OU  - beautiful surgeries, doing well  - monitor  Ophthalmic Meds Ordered this visit:  Meds ordered this encounter  Medications   Bevacizumab (AVASTIN) SOLN 1.25 mg   Return in about 11 weeks (around 12/23/2020).  There are no Patient  Instructions on file for this visit.   This document serves as a record of services personally performed by Gardiner Sleeper, MD, PhD. It was created on their behalf by Estill Bakes, COT an ophthalmic technician. The creation of this record is the provider's dictation and/or activities during the visit.    Electronically signed by: Estill Bakes, COT 9.13.22 @ 4:21 PM   Gardiner Sleeper, M.D., Ph.D. Diseases & Surgery of the Retina and Vitreous [facility]  I have reviewed the above documentation for accuracy and completeness, and I agree with the above. Gardiner Sleeper, M.D., Ph.D. 10/07/20 4:24 PM   Abbreviations: M myopia (nearsighted); A astigmatism; H hyperopia (farsighted); P presbyopia; Mrx spectacle prescription;  CTL contact lenses; OD right eye; OS left eye; OU both eyes  XT exotropia; ET esotropia; PEK punctate epithelial keratitis; PEE punctate epithelial erosions; DES dry eye syndrome; MGD meibomian gland dysfunction; ATs artificial tears; PFAT's preservative free artificial tears; [place] nuclear sclerotic cataract; PSC posterior subcapsular cataract; ERM epi-retinal membrane; PVD posterior vitreous detachment; RD retinal detachment; DM diabetes mellitus; DR diabetic retinopathy; NPDR non-proliferative diabetic retinopathy; PDR proliferative diabetic retinopathy; CSME clinically significant macular edema; DME diabetic macular edema; dbh dot blot hemorrhages; CWS cotton wool spot; POAG primary open angle glaucoma; C/D cup-to-disc ratio; HVF humphrey visual field; GVF goldmann visual field; OCT optical coherence tomography; IOP intraocular pressure; BRVO Branch retinal vein occlusion; CRVO central retinal vein occlusion; CRAO central retinal artery occlusion; BRAO branch retinal artery occlusion; RT retinal tear; SB scleral buckle; PPV pars plana vitrectomy; VH Vitreous hemorrhage; PRP panretinal laser photocoagulation; IVK intravitreal kenalog; VMT vitreomacular traction;  MH Macular hole;  NVD neovascularization of the disc; NVE neovascularization elsewhere; AREDS age related eye disease study; ARMD age related macular degeneration; POAG primary open angle glaucoma; EBMD epithelial/anterior basement membrane dystrophy; ACIOL anterior chamber intraocular lens; IOL intraocular lens; PCIOL posterior chamber intraocular lens; Phaco/IOL phacoemulsification with intraocular lens placement; [place] photorefractive keratectomy; LASIK laser assisted in situ keratomileusis; HTN hypertension; DM diabetes mellitus; COPD chronic obstructive pulmonary disease

## 2020-10-10 DIAGNOSIS — E1165 Type 2 diabetes mellitus with hyperglycemia: Secondary | ICD-10-CM | POA: Diagnosis not present

## 2020-10-10 DIAGNOSIS — N1832 Chronic kidney disease, stage 3b: Secondary | ICD-10-CM | POA: Diagnosis not present

## 2020-10-10 DIAGNOSIS — Z23 Encounter for immunization: Secondary | ICD-10-CM | POA: Diagnosis not present

## 2020-10-10 DIAGNOSIS — R0989 Other specified symptoms and signs involving the circulatory and respiratory systems: Secondary | ICD-10-CM | POA: Diagnosis not present

## 2020-10-10 DIAGNOSIS — E782 Mixed hyperlipidemia: Secondary | ICD-10-CM | POA: Diagnosis not present

## 2020-10-10 DIAGNOSIS — E1121 Type 2 diabetes mellitus with diabetic nephropathy: Secondary | ICD-10-CM | POA: Diagnosis not present

## 2020-10-10 DIAGNOSIS — Z0000 Encounter for general adult medical examination without abnormal findings: Secondary | ICD-10-CM | POA: Diagnosis not present

## 2020-10-10 DIAGNOSIS — I129 Hypertensive chronic kidney disease with stage 1 through stage 4 chronic kidney disease, or unspecified chronic kidney disease: Secondary | ICD-10-CM | POA: Diagnosis not present

## 2020-10-16 DIAGNOSIS — C44319 Basal cell carcinoma of skin of other parts of face: Secondary | ICD-10-CM | POA: Diagnosis not present

## 2020-11-10 ENCOUNTER — Ambulatory Visit: Payer: Medicare Other | Admitting: Podiatry

## 2020-11-12 DIAGNOSIS — N1832 Chronic kidney disease, stage 3b: Secondary | ICD-10-CM | POA: Diagnosis not present

## 2020-11-12 DIAGNOSIS — E782 Mixed hyperlipidemia: Secondary | ICD-10-CM | POA: Diagnosis not present

## 2020-11-12 DIAGNOSIS — E1165 Type 2 diabetes mellitus with hyperglycemia: Secondary | ICD-10-CM | POA: Diagnosis not present

## 2020-11-12 DIAGNOSIS — R0989 Other specified symptoms and signs involving the circulatory and respiratory systems: Secondary | ICD-10-CM | POA: Diagnosis not present

## 2020-11-20 DIAGNOSIS — R0989 Other specified symptoms and signs involving the circulatory and respiratory systems: Secondary | ICD-10-CM | POA: Diagnosis not present

## 2020-11-25 ENCOUNTER — Other Ambulatory Visit: Payer: Self-pay

## 2020-11-25 ENCOUNTER — Ambulatory Visit: Payer: Medicare Other | Admitting: Podiatry

## 2020-11-25 ENCOUNTER — Encounter: Payer: Self-pay | Admitting: Podiatry

## 2020-11-25 DIAGNOSIS — M79674 Pain in right toe(s): Secondary | ICD-10-CM | POA: Diagnosis not present

## 2020-11-25 DIAGNOSIS — M79675 Pain in left toe(s): Secondary | ICD-10-CM

## 2020-11-25 DIAGNOSIS — E1142 Type 2 diabetes mellitus with diabetic polyneuropathy: Secondary | ICD-10-CM

## 2020-11-25 DIAGNOSIS — L84 Corns and callosities: Secondary | ICD-10-CM | POA: Diagnosis not present

## 2020-11-25 DIAGNOSIS — B351 Tinea unguium: Secondary | ICD-10-CM | POA: Diagnosis not present

## 2020-11-25 NOTE — Addendum Note (Signed)
Addended by: Gardiner Barefoot on: 11/25/2020 06:59 PM   Modules accepted: Level of Service

## 2020-11-25 NOTE — Progress Notes (Signed)
This patient returns to my office for at risk foot care.  This patient requires this care by a professional since this patient will be at risk due to having diabetes with neuropathy and CKD.   This patient is unable to cut nails herself since the patient cannot reach her nails.These nails are painful walking and wearing shoes.   She also has painful callus under the ball of her right foot. This patient presents for at risk foot care today.  General Appearance  Alert, conversant and in no acute stress.  Vascular  Dorsalis pedis and posterior tibial  pulses are palpable  bilaterally.  Capillary return is within normal limits  bilaterally. Temperature is within normal limits  bilaterally.  Neurologic  Senn-Weinstein monofilament wire test diminished  bilaterally. Muscle power within normal limits bilaterally.  Nails Thick disfigured discolored nails with subungual debris  from hallux to fifth toes bilaterally except second toe right foot.. No evidence of bacterial infection or drainage bilaterally.  Orthopedic  No limitations of motion  feet .  No crepitus or effusions noted.  No bony pathology or digital deformities noted.  Skin  normotropic skin with  porokeratosis noted  left forefoot right foot.    No signs of infections or ulcers noted.     Onychomycosis  Pain in right toes  Pain in left toes  Porokeratosis sub 2 right foot.  Consent was obtained for treatment procedures.   Mechanical debridement of nails 1-5  bilaterally performed with a nail nipper.  Filed with dremel without incident. Debride callus right forefoot.   Return office visit  prn.                   Told patient to return for periodic foot care and evaluation due to potential at risk complications.   Gardiner Barefoot DPM

## 2020-12-12 NOTE — Progress Notes (Signed)
[facility] Note  12/23/2020   CHIEF COMPLAINT Patient presents for Retina Follow Up  HISTORY OF PRESENT ILLNESS: Sue [name] is a 87 y.o. female who presents to the clinic today for:  HPI     Retina Follow Up   Patient presents with  Wet AMD.  In left eye.  Severity is moderate.  Duration of 11 weeks.  Since onset it is stable.  I, the attending physician,  performed the HPI with the patient and updated documentation appropriately.        Comments   Pt here for 11 wk ret f/u for exu ARMD OS. Pt states shes doing well, vision is the same. Blood sugar this am was 130. Most recent A1C was around 8 but she is due for a recheck soon. No ocular pain or discomfort.       Last edited by Bernarda Caffey, MD on 12/23/2020 11:56 AM.    Pt feels like she did not do very well on the eye test today, she is thinking about going back to [place] and getting a new rx    Referring physician: Lisabeth Pick, MD [place] ,  [place] [id number]  HISTORICAL INFORMATION:   Selected notes from the MEDICAL RECORD NUMBER Referred by Dr. Quentin Ore for concern of ARMD OU   CURRENT MEDICATIONS: Current Outpatient Medications (Ophthalmic Drugs)  Medication Sig   Polyethyl Glycol-Propyl Glycol (SYSTANE) 0.4-0.3 % SOLN Place 1 drop into both eyes 3 (three) times daily as needed (dry eyes).   RESTASIS 0.05 % ophthalmic emulsion Place 1 drop into both eyes 2 (two) times daily.   No current facility-administered medications for this visit. (Ophthalmic Drugs)   Current Outpatient Medications (Other)  Medication Sig   amLODipine (NORVASC) 10 MG tablet Take 10 mg by mouth every morning.    calcium-vitamin D (OSCAL WITH D) 500-200 MG-UNIT tablet Take 1 tablet by mouth 2 (two) times daily.    cephALEXin (KEFLEX) 500 MG capsule Take 1 capsule (500 mg total) by mouth 2 (two) times daily.   diphenhydramine-acetaminophen (TYLENOL PM) 25-500 MG TABS tablet Take 1  tablet by mouth at bedtime as needed (sleep).   hydrALAZINE (APRESOLINE) 25 MG tablet Take 3 tablets (75 mg total) by mouth every 8 (eight) hours.   hydrochlorothiazide (HYDRODIURIL) 25 MG tablet Take 25 mg by mouth daily.   ibuprofen (ADVIL) 200 MG tablet Take 400 mg by mouth every 6 (six) hours as needed for moderate pain.   Insulin Degludec (TRESIBA FLEXTOUCH) 200 UNIT/ML SOPN Inject 36 Units into the skin at bedtime. (Patient taking differently: Inject 22 Units into the skin at bedtime.)   ketoconazole (NIZORAL) 2 % cream Apply topically.   Multiple Vitamins-Minerals (PRESERVISION AREDS 2) CAPS Take 1 capsule by mouth 2 (two) times daily.   mupirocin ointment (BACTROBAN) 2 % Apply to right foot ulcer once daily. (Patient taking differently: Apply 1 application topically daily as needed (wound care). Apply to right foot ulcer once daily.)   omeprazole (PRILOSEC) 20 MG capsule Take 20 mg by mouth every morning.   simvastatin (ZOCOR) 20 MG tablet Take 20 mg by mouth at bedtime.    vitamin B-12 (CYANOCOBALAMIN) 1000 MCG tablet Take 1,000 mcg by mouth 2 (two) times daily.    B-D ULTRAFINE III SHORT PEN 31G X 8 MM MISC USE AS DIRECTED ONCE DAILY SUBCUTANEOUSLY   glucose blood test strip OneTouch Ultra Test strips  TEST ONCE A DAY ONCE A  DAY FINGERSTICK 90   Insulin Pen Needle (PEN NEEDLES) 32G X 4 MM MISC BD Ultra-Fine Nano Pen Needle 32 gauge x 5/32"  USE AS DIRECTED DAILY   JANUVIA 25 MG tablet Take 25 mg by mouth daily. (Patient not taking: Reported on 12/23/2020)   Lancets (ONETOUCH ULTRASOFT) lancets OneTouch UltraSoft Lancets  USE TWICE DAILY   Melatonin 10 MG CAPS Take 10 mg by mouth at bedtime as needed (sleep). (Patient not taking: Reported on 12/23/2020)   meloxicam (MOBIC) 7.5 MG tablet Take 1 tablet (7.5 mg total) by mouth daily as needed for pain.   Multiple Vitamins-Minerals (ADULT ONE DAILY GUMMIES) CHEW Chew 1 tablet by mouth 2 (two) times daily. Centrum   Multiple  Vitamins-Minerals (AIRBORNE) TBEF Take 1 tablet by mouth daily as needed (immune support).    nortriptyline (PAMELOR) 25 MG capsule Take 1 capsule (25 mg total) by mouth at bedtime. (Patient taking differently: Take 25 mg by mouth 2 (two) times daily.)   Omega-3 Fatty Acids (FISH OIL) 1200 MG CAPS Take 1,200 mg by mouth daily with lunch.    ONE TOUCH ULTRA TEST test strip    PRESCRIPTION MEDICATION by Intravitreal route every 30 (thirty) days. Ophthalmic injection at MD office in left eye   silver sulfADIAZINE (SILVADENE) 1 % cream Apply pea-sized amount to wound daily. (Patient taking differently: Apply 1 application topically daily. Apply pea-sized amount to wound daily.)   traMADol (ULTRAM) 50 MG tablet Take 1 tablet (50 mg total) by mouth 2 (two) times daily as needed. (Patient not taking: Reported on 12/23/2020)   Current Facility-Administered Medications (Other)  Medication Route   Bevacizumab (AVASTIN) SOLN 1.25 mg Intravitreal   Bevacizumab (AVASTIN) SOLN 1.25 mg Intravitreal   REVIEW OF SYSTEMS: ROS   Positive for: Neurological, Skin, Genitourinary, Musculoskeletal, Endocrine, Eyes Negative for: Constitutional, Gastrointestinal, HENT, Cardiovascular, Respiratory, Psychiatric, Allergic/Imm, Heme/Lymph Last edited by Kingsley Spittle, COT on 12/23/2020 10:14 AM.      ALLERGIES No Known Allergies  PAST MEDICAL HISTORY Past Medical History:  Diagnosis Date   Anemia    Arthritis    Basal cell carcinoma 11/23/2017   left elbow, right neck TX CX3 5FU    Basal cell carcinoma 04/04/2019   infil-right sideburn (CX35FU)   Chronic kidney disease    stage 3 ckd stage 3 b per dr Ashby Dawes 09-26-2019 note on chart   Complication of anesthesia    deifficulty waking up   COVID-19    Diabetic neuropathy ([place])    Hyperlipidemia    Hypertension    Hypertensive retinopathy    OU   Infiltrative basal cell carcinoma (BCC) 04/22/2020   Right Zygomatic Area    Macular degeneration     OU   Nodular basal cell carcinoma (BCC) 04/22/2020   Right Anterior Neck   Pneumonia    Right carotid bruit per dr Ashby Dawes 09-26-2019 note on chart   SCC (squamous cell carcinoma) 11/23/2017   right forehead TX CX3 5FU   SCCA (squamous cell carcinoma) of skin 04/22/2020   Mid Parietal Scalp (in situ)   SCCA (squamous cell carcinoma) of skin 04/22/2020   Right Dorsal Hand (in situ)   Type 2 DM with CKD stage 3 and hypertension ([place])    Past Surgical History:  Procedure Laterality Date   ABDOMINAL HYSTERECTOMY     AMPUTATION TOE Right 09/28/2019   Procedure: Right 2nd toe amputation - partial;  Surgeon: Evelina Bucy, DPM;  Location: [facility];  Service: Podiatry;  Laterality: Right;   CATARACT EXTRACTION     CHOLECYSTECTOMY     EYE SURGERY     HERNIA REPAIR     SPINE SURGERY     TONSILLECTOMY      FAMILY HISTORY History reviewed. No pertinent family history.  SOCIAL HISTORY Social History   Tobacco Use   Smoking status: Former   Smokeless tobacco: Never  Scientific laboratory technician Use: Never used  Substance Use Topics   Alcohol use: No    Alcohol/week: 0.0 standard drinks   Drug use: No       OPHTHALMIC EXAM:  Base Eye Exam     Visual Acuity (Snellen - Linear)       Right Left   Dist cc 20/50 -2 20/50 -2   Dist ph cc 20/40 -1 NI    Correction: Glasses         Tonometry (Tonopen, 10:33 AM)       Right Left   Pressure 14 18         Pupils       Dark Light Shape React APD   Right 3 2 Round Brisk None   Left 3 2 Round Brisk None         Visual Fields (Counting fingers)       Left Right    Full Full         Extraocular Movement       Right Left    Full, Ortho Full, Ortho         Neuro/Psych     Oriented x3: Yes   Mood/Affect: Normal         Dilation     Both eyes: 1.0% Mydriacyl, 2.5% Phenylephrine @ 10:33 AM           Slit Lamp and Fundus Exam     Slit Lamp Exam       Right Left   Lids/Lashes  Dermatochalasis - upper lid, Telangiectasia, mild MGD Dermatochalasis - upper lid, Telangiectasia, Meibomian gland dysfunction   Conjunctiva/Sclera White and quiet White and quiet   Cornea Temporal Well healed cataract wounds, mild Arcus, trace endo pigment nasally, 1+PEE Arcus, Temporal Well healed cataract wounds, 1-2+ PEE   Anterior Chamber Deep and quiet Deep and quiet   Iris Round and moderately dilated to 4.2mm Round and moderately dilated to 94mm, Iris atrophy from 0100 to 0230, peripheral irodotimy at 0200   Lens 3-piece PC IOL in good position PC IOL in good position   Anterior Vitreous Vitreous syneresis, Posterior vitreous detachment, Weiss ring, vitreous condensations Vitreous syneresis, PVD, vitreous condensations         Fundus Exam       Right Left   Disc 360 Peripapillary atrophy, diffuse mild pallor, compact, sharp rim mild pallor, Peripapillary atrophy and pigmentation   C/D Ratio 0.1 0.2   Macula Blunted foveal reflex, +PED, RPE mottling, clumping and early atrophy, Drusen, No heme or edema Blunted foveal reflex, low lying PED / +CNVM with stable resolution of IRF, interval increase in SRF, RPE mottling and clumping, Drusen, No heme   Vessels Vascular attenuation Vascular attenuation   Periphery Attached, 360 Reticular degeneration Attached, 360 Reticular degeneration           Refraction     Wearing Rx       Sphere Cylinder Axis   Right -1.50 +0.75 180   Left -0.50 +0.75 180         Manifest Refraction  Sphere Cylinder Axis Dist VA   Right -2.00 +0.50 180 20/50   Left +0.25 +1.00 180 20/40-2            IMAGING AND PROCEDURES  Imaging and Procedures for @TODAY @  OCT, Retina - OU - Both Eyes       Right Eye Quality was good. Central Foveal Thickness: 240. Progression has been stable. Findings include normal foveal contour, no IRF, no SRF, pigment epithelial detachment, outer retinal atrophy, retinal drusen (persistent PED).   Left  Eye Quality was good. Central Foveal Thickness: 270. Progression has worsened. Findings include no IRF, retinal drusen , pigment epithelial detachment, outer retinal atrophy, epiretinal membrane, abnormal foveal contour, subretinal fluid (Interval increase SRF overlying stable, low PED, partial PVD).   Notes *Images captured and stored on drive  Diagnosis / Impression:  OD: Non-Exudative ARMD -- stable OS: Exudative ARMD -- Interval increase SRF overlying stable, low PED, partial PVD  Clinical management:  See below  Abbreviations: NFP - Normal foveal profile. CME - cystoid macular edema. PED - pigment epithelial detachment. IRF - intraretinal fluid. SRF - subretinal fluid. EZ - ellipsoid zone. ERM - epiretinal membrane. ORA - outer retinal atrophy. ORT - outer retinal tubulation. SRHM - subretinal hyper-reflective material       Intravitreal Injection, Pharmacologic Agent - OS - Left Eye       Time Out 12/23/2020. 10:58 AM. Confirmed correct patient, procedure, site, and patient consented.   Anesthesia Topical anesthesia was used. Anesthetic medications included Lidocaine 2%, Proparacaine 0.5%.   Procedure Preparation included 5% betadine to ocular surface, eyelid speculum. A (32g) needle was used.   Injection: 1.25 mg Bevacizumab 1.25mg /0.42ml   Route: Intravitreal, Site: Left Eye   NDC: 62836-629-47, Lot: [id number] , Expiration date: 02/03/2021, Waste: 0 mL   Post-op Post injection exam found visual acuity of at least counting fingers. The patient tolerated the procedure well. There were no complications. The patient received written and verbal post procedure care education. Post injection medications were not given.            ASSESSMENT/PLAN:    ICD-10-CM   1. Exudative age-related macular degeneration of left eye with active choroidal neovascularization (HCC)  H35.3221 Intravitreal Injection, Pharmacologic Agent - OS - Left Eye    Bevacizumab (AVASTIN) SOLN 1.25  mg    2. Retinal edema  H35.81 OCT, Retina - OU - Both Eyes    3. 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 Diabetes mellitus type 2 without retinopathy ([place])  E11.9     5. Essential hypertension  I10     6. Hypertensive retinopathy of both eyes  H35.033     7. Pseudophakia of both eyes  Z96.1       1,2. Exudative age-related macular degeneration, left eye  - conversion of OS from nonexudative to exudative ARMD prior to 02.05.20 visit  - s/p IVA OS #1 (02.05.20), #2 (03.04.20), #3 (04.30.20), #4 (06.02.20), #5 (07.14.20), #6 (9.1.20), #7 (10.27.20), #8 (12.23.20), #9 (02.03.21), #10 (03.17.21), #11 (04.28.21), #12 (06.09.21), #13 (08.04.21), #14 (09.29.21), #15 (11.22.2021), #16 (01.26.22), #17 (04.06.22), #18 (06.28.22), #19 (9.13.22)  - OCT shows Interval increase SRF overlying stable, low PED at 11 wks   - BCVA decreased to 20/50 from 20/30   - recommend IVA #20 OS today, 11.29.22 w/ f/u in 9 wks  - pt wishes to be treated with IVA OS  - risks and benefits of intravitreal avastin discussed with  patient  - informed consent obtained  - see procedure note  - Avastin informed consent obtained and scanned 02.03.21  - f/u in 9 wks for DFE, OCT, likely injection OS   3. Age related macular degeneration, non-exudative, right eye  - stable, former pt of Dr. Zigmund Daniel -- no significant change in OCT or exam  - The incidence, anatomy, and pathology of dry AMD, risk of progression, and the AREDS and AREDS 2 study including smoking risks discussed with patient.  - recommend amsler grid monitoring  4. Diabetes mellitus, type 2 without retinopathy  - The incidence, risk factors for progression, natural history and treatment options for diabetic retinopathy  were discussed with patient.    - The need for close monitoring of blood glucose, blood pressure, and serum lipids, avoiding cigarette or any type of tobacco, and the need for long term  follow up was also discussed with patient.  - f/u in 1 year  5,6. Hypertensive retinopathy OU  - discussed importance of tight BP control  - monitor  7. Pseudophakia OU  - s/p CE/IOL OU  - beautiful surgeries, doing well  - monitor  Ophthalmic Meds Ordered this visit:  Meds ordered this encounter  Medications   Bevacizumab (AVASTIN) SOLN 1.25 mg    Return in about 9 weeks (around 02/24/2021) for ex ARMD OS, Dilated Exam, OCT, Possible Injxn.  There are no Patient Instructions on file for this visit.   This document serves as a record of services personally performed by Gardiner Sleeper, MD, PhD. It was created on their behalf by Estill Bakes, COT an ophthalmic technician. The creation of this record is the provider's dictation and/or activities during the visit.    Electronically signed by: Estill Bakes, COT 11.18.22 @ 11:58 AM   This document serves as a record of services personally performed by Gardiner Sleeper, MD, PhD. It was created on their behalf by San Jetty. Owens Shark, OA an ophthalmic technician. The creation of this record is the provider's dictation and/or activities during the visit.    Electronically signed by: San Jetty. Owens Shark, [place] 11.29.2022 11:58 AM   Gardiner Sleeper, M.D., Ph.D. Diseases & Surgery of the Retina and Vitreous [facility]  I have reviewed the above documentation for accuracy and completeness, and I agree with the above. Gardiner Sleeper, M.D., Ph.D. 12/23/20 11:59 AM  Abbreviations: M myopia (nearsighted); A astigmatism; H hyperopia (farsighted); P presbyopia; Mrx spectacle prescription;  CTL contact lenses; OD right eye; OS left eye; OU both eyes  XT exotropia; ET esotropia; PEK punctate epithelial keratitis; PEE punctate epithelial erosions; DES dry eye syndrome; MGD meibomian gland dysfunction; ATs artificial tears; PFAT's preservative free artificial tears; [place] nuclear sclerotic cataract; PSC posterior subcapsular cataract; ERM  epi-retinal membrane; PVD posterior vitreous detachment; RD retinal detachment; DM diabetes mellitus; DR diabetic retinopathy; NPDR non-proliferative diabetic retinopathy; PDR proliferative diabetic retinopathy; CSME clinically significant macular edema; DME diabetic macular edema; dbh dot blot hemorrhages; CWS cotton wool spot; POAG primary open angle glaucoma; C/D cup-to-disc ratio; HVF humphrey visual field; GVF goldmann visual field; OCT optical coherence tomography; IOP intraocular pressure; BRVO Branch retinal vein occlusion; CRVO central retinal vein occlusion; CRAO central retinal artery occlusion; BRAO branch retinal artery occlusion; RT retinal tear; SB scleral buckle; PPV pars plana vitrectomy; VH Vitreous hemorrhage; PRP panretinal laser photocoagulation; IVK intravitreal kenalog; VMT vitreomacular traction; MH Macular hole;  NVD neovascularization of the disc; NVE neovascularization elsewhere; AREDS age related eye disease  study; ARMD age related macular degeneration; POAG primary open angle glaucoma; EBMD epithelial/anterior basement membrane dystrophy; ACIOL anterior chamber intraocular lens; IOL intraocular lens; PCIOL posterior chamber intraocular lens; Phaco/IOL phacoemulsification with intraocular lens placement; [place] photorefractive keratectomy; LASIK laser assisted in situ keratomileusis; HTN hypertension; DM diabetes mellitus; COPD chronic obstructive pulmonary disease

## 2020-12-23 ENCOUNTER — Ambulatory Visit (INDEPENDENT_AMBULATORY_CARE_PROVIDER_SITE_OTHER): Payer: Medicare Other | Admitting: Ophthalmology

## 2020-12-23 ENCOUNTER — Other Ambulatory Visit: Payer: Self-pay

## 2020-12-23 ENCOUNTER — Encounter (INDEPENDENT_AMBULATORY_CARE_PROVIDER_SITE_OTHER): Payer: Self-pay | Admitting: Ophthalmology

## 2020-12-23 DIAGNOSIS — H353112 Nonexudative age-related macular degeneration, right eye, intermediate dry stage: Secondary | ICD-10-CM | POA: Diagnosis not present

## 2020-12-23 DIAGNOSIS — H353221 Exudative age-related macular degeneration, left eye, with active choroidal neovascularization: Secondary | ICD-10-CM

## 2020-12-23 DIAGNOSIS — H35033 Hypertensive retinopathy, bilateral: Secondary | ICD-10-CM | POA: Diagnosis not present

## 2020-12-23 DIAGNOSIS — Z961 Presence of intraocular lens: Secondary | ICD-10-CM | POA: Diagnosis not present

## 2020-12-23 DIAGNOSIS — E119 Type 2 diabetes mellitus without complications: Secondary | ICD-10-CM

## 2020-12-23 DIAGNOSIS — H3581 Retinal edema: Secondary | ICD-10-CM

## 2020-12-23 DIAGNOSIS — I10 Essential (primary) hypertension: Secondary | ICD-10-CM | POA: Diagnosis not present

## 2020-12-23 MED ORDER — BEVACIZUMAB CHEMO INJECTION 1.25MG/0.05ML SYRINGE FOR KALEIDOSCOPE
1.2500 mg | INTRAVITREAL | Status: AC | PRN
  Administered 2020-12-23: 1.25 mg via INTRAVITREAL

## 2021-01-05 DIAGNOSIS — E1165 Type 2 diabetes mellitus with hyperglycemia: Secondary | ICD-10-CM | POA: Diagnosis not present

## 2021-01-05 DIAGNOSIS — N1832 Chronic kidney disease, stage 3b: Secondary | ICD-10-CM | POA: Diagnosis not present

## 2021-01-05 DIAGNOSIS — E782 Mixed hyperlipidemia: Secondary | ICD-10-CM | POA: Diagnosis not present

## 2021-01-07 ENCOUNTER — Encounter: Payer: Self-pay | Admitting: Podiatry

## 2021-01-07 ENCOUNTER — Other Ambulatory Visit: Payer: Self-pay

## 2021-01-07 ENCOUNTER — Ambulatory Visit: Payer: Medicare Other | Admitting: Podiatry

## 2021-01-07 DIAGNOSIS — Z89421 Acquired absence of other right toe(s): Secondary | ICD-10-CM

## 2021-01-07 DIAGNOSIS — B351 Tinea unguium: Secondary | ICD-10-CM

## 2021-01-07 DIAGNOSIS — L84 Corns and callosities: Secondary | ICD-10-CM | POA: Diagnosis not present

## 2021-01-07 DIAGNOSIS — M79675 Pain in left toe(s): Secondary | ICD-10-CM

## 2021-01-07 DIAGNOSIS — E1142 Type 2 diabetes mellitus with diabetic polyneuropathy: Secondary | ICD-10-CM | POA: Diagnosis not present

## 2021-01-07 DIAGNOSIS — M79674 Pain in right toe(s): Secondary | ICD-10-CM

## 2021-01-11 NOTE — Progress Notes (Signed)
°  Subjective:  Patient ID: Sue [name], female    DOB: [birth year],  MRN: [id number]  Sue [name] presents to clinic today for at risk foot care. Patient has h/o amputation of partial amputation of R 2nd toe  Patient states blood glucose was 140 mg/dl today.    Her daughter is present during today's visit.  PCP is Merrilee Seashore, MD , and last visit was 11/28/2020.  No Known Allergies  Review of Systems: Negative except as noted in the HPI. Objective:   Constitutional Sue [name] is a pleasant 87 y.o. Caucasian female, WD, WN in NAD. AAO x 3.   Vascular CFT immediate b/l LE. Palpable DP/PT pulses b/l LE. Digital hair absent b/l. Skin temperature gradient WNL b/l. No pain with calf compression b/l. No edema noted b/l. No cyanosis or clubbing noted b/l LE.  Neurologic Normal speech. Oriented to person, place, and time. Protective sensation diminished with 10g monofilament b/l.  Dermatologic Pedal skin is warm and supple b/l LE. No open wounds b/l LE. No interdigital macerations noted b/l LE. Toenails 1-5 left, R hallux, R 3rd toe, R 4th toe, and R 5th toe well maintained with adequate length. No erythema, no edema, no drainage, no fluctuance. Hyperkeratotic lesion(s) submet head 2 right foot.  No erythema, no edema, no drainage, no fluctuance.  Orthopedic: Muscle strength 5/5 to all lower extremity muscle groups bilaterally. No pain, crepitus or joint limitation noted with ROM bilateral LE. 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   Radiographs: None   Assessment:   1. Pain due to onychomycosis of toenails of both feet   2. Callus   3. History of partial amputation of toe of right foot ([place])   4. Diabetic peripheral neuropathy associated with type 2 diabetes mellitus ([place])    Plan:  Patient was evaluated and treated and all questions answered. Consent given for treatment as described below: -Diabetic foot examination performed today. -Continue  foot and shoe inspections daily. Monitor blood glucose per PCP/Endocrinologist's recommendations. -Mycotic toenails 1-5 left, R hallux, R 3rd toe, R 4th toe, and R 5th toe were debrided in length and girth with sterile nail nippers and dremel without iatrogenic bleeding. -Callus(es) submet head 2 right foot pared utilizing sterile scalpel blade without complication or incident. Total number debrided =1. -Patient/POA to call should there be question/concern in the interim.  Return in about 3 months (around 04/07/2021).  Marzetta Board, DPM

## 2021-01-12 DIAGNOSIS — E782 Mixed hyperlipidemia: Secondary | ICD-10-CM | POA: Diagnosis not present

## 2021-01-12 DIAGNOSIS — E1165 Type 2 diabetes mellitus with hyperglycemia: Secondary | ICD-10-CM | POA: Diagnosis not present

## 2021-01-12 DIAGNOSIS — N183 Chronic kidney disease, stage 3 unspecified: Secondary | ICD-10-CM | POA: Diagnosis not present

## 2021-01-12 DIAGNOSIS — R0989 Other specified symptoms and signs involving the circulatory and respiratory systems: Secondary | ICD-10-CM | POA: Diagnosis not present

## 2021-01-12 DIAGNOSIS — K5901 Slow transit constipation: Secondary | ICD-10-CM | POA: Diagnosis not present

## 2021-01-12 DIAGNOSIS — E1142 Type 2 diabetes mellitus with diabetic polyneuropathy: Secondary | ICD-10-CM | POA: Diagnosis not present

## 2021-01-12 DIAGNOSIS — N1832 Chronic kidney disease, stage 3b: Secondary | ICD-10-CM | POA: Diagnosis not present

## 2021-01-27 ENCOUNTER — Other Ambulatory Visit: Payer: Medicare Other

## 2021-02-23 NOTE — Progress Notes (Signed)
[facility] Note  02/24/2021   CHIEF COMPLAINT Patient presents for Retina Follow Up  HISTORY OF PRESENT ILLNESS: Sue [name] is a 88 y.o. female who presents to the clinic today for:  HPI     Retina Follow Up   Patient presents with  Wet AMD.  In left eye.  This started 9 weeks ago.  I, the attending physician,  performed the HPI with the patient and updated documentation appropriately.        Comments   Patient here for 9 weeks retina follow up for exu ARMD OS. Patient states vision about the same. No difference. No eye pain. Some times has tiny floaters. When looks down a lot gets a floater but goes away.       Last edited by Bernarda Caffey, MD on 02/27/2021  2:30 AM.      Referring physician: Merrilee Seashore, [address] [place],  [place] [id number]  HISTORICAL INFORMATION:   Selected notes from the MEDICAL RECORD NUMBER Referred by Dr. Quentin Ore for concern of ARMD OU   CURRENT MEDICATIONS: Current Outpatient Medications (Ophthalmic Drugs)  Medication Sig   RESTASIS 0.05 % ophthalmic emulsion Place 1 drop into both eyes 2 (two) times daily.   Polyethyl Glycol-Propyl Glycol (SYSTANE) 0.4-0.3 % SOLN Place 1 drop into both eyes 3 (three) times daily as needed (dry eyes).   No current facility-administered medications for this visit. (Ophthalmic Drugs)   Current Outpatient Medications (Other)  Medication Sig   amLODipine (NORVASC) 10 MG tablet Take 10 mg by mouth every morning.    B-D ULTRAFINE III SHORT PEN 31G X 8 MM MISC USE AS DIRECTED ONCE DAILY SUBCUTANEOUSLY   Calcium Citrate-Vitamin D (CALCIUM + D PO)    calcium-vitamin D (OSCAL WITH D) 500-200 MG-UNIT tablet Take 1 tablet by mouth 2 (two) times daily.    cephALEXin (KEFLEX) 500 MG capsule Take 1 capsule (500 mg total) by mouth 2 (two) times daily.   Continuous Blood Gluc Sensor (FREESTYLE LIBRE 2 SENSOR) MISC See admin instructions.   Cyanocobalamin  (B-12 PO)    diphenhydramine-acetaminophen (TYLENOL PM) 25-500 MG TABS tablet Take 1 tablet by mouth at bedtime as needed (sleep).   Glucagon (GVOKE HYPOPEN 2-PACK) 0.5 MG/0.1ML SOAJ as needed for hypoglycemia   glucose blood test strip OneTouch Ultra Test strips  TEST ONCE A DAY ONCE A DAY FINGERSTICK 90   hydrALAZINE (APRESOLINE) 25 MG tablet Take 3 tablets (75 mg total) by mouth every 8 (eight) hours.   hydrochlorothiazide (HYDRODIURIL) 25 MG tablet Take 25 mg by mouth daily.   ibuprofen (ADVIL) 200 MG tablet Take 400 mg by mouth every 6 (six) hours as needed for moderate pain.   Insulin Degludec (TRESIBA FLEXTOUCH) 200 UNIT/ML SOPN Inject 36 Units into the skin at bedtime. (Patient taking differently: Inject 22 Units into the skin at bedtime.)   Insulin Pen Needle (PEN NEEDLES) 32G X 4 MM MISC BD Ultra-Fine Nano Pen Needle 32 gauge x 5/32"  USE AS DIRECTED DAILY   ketoconazole (NIZORAL) 2 % cream Apply topically.   Lancets (ONETOUCH ULTRASOFT) lancets OneTouch UltraSoft Lancets  USE TWICE DAILY   meloxicam (MOBIC) 7.5 MG tablet Take 1 tablet (7.5 mg total) by mouth daily as needed for pain.   Multiple Vitamins-Minerals (ADULT ONE DAILY GUMMIES) CHEW Chew 1 tablet by mouth 2 (two) times daily. Centrum   Multiple Vitamins-Minerals (AIRBORNE) TBEF Take 1 tablet by mouth daily as needed (  immune support).    Multiple Vitamins-Minerals (CENTRUM SILVER PO)    Multiple Vitamins-Minerals (PRESERVISION AREDS 2) CAPS Take 1 capsule by mouth 2 (two) times daily.   mupirocin ointment (BACTROBAN) 2 % Apply to right foot ulcer once daily. (Patient taking differently: Apply 1 application topically daily as needed (wound care). Apply to right foot ulcer once daily.)   nortriptyline (PAMELOR) 25 MG capsule Take 1 capsule (25 mg total) by mouth at bedtime. (Patient taking differently: Take 25 mg by mouth 2 (two) times daily.)   Omega-3 Fatty Acids (FISH OIL) 1200 MG CAPS Take 1,200 mg by mouth daily with  lunch.    omeprazole (PRILOSEC) 20 MG capsule Take 20 mg by mouth every morning.   ONE TOUCH ULTRA TEST test strip    PRESCRIPTION MEDICATION by Intravitreal route every 30 (thirty) days. Ophthalmic injection at MD office in left eye   silver sulfADIAZINE (SILVADENE) 1 % cream Apply pea-sized amount to wound daily. (Patient taking differently: Apply 1 application topically daily. Apply pea-sized amount to wound daily.)   simvastatin (ZOCOR) 20 MG tablet Take 20 mg by mouth at bedtime.    vitamin B-12 (CYANOCOBALAMIN) 1000 MCG tablet Take 1,000 mcg by mouth 2 (two) times daily.    JANUVIA 25 MG tablet Take 25 mg by mouth daily. (Patient not taking: Reported on 02/26/2021)   Melatonin 10 MG CAPS Take 10 mg by mouth at bedtime as needed (sleep).   traMADol (ULTRAM) 50 MG tablet Take 1 tablet (50 mg total) by mouth 2 (two) times daily as needed.   Current Facility-Administered Medications (Other)  Medication Route   Bevacizumab (AVASTIN) SOLN 1.25 mg Intravitreal   Bevacizumab (AVASTIN) SOLN 1.25 mg Intravitreal   REVIEW OF SYSTEMS: ROS   Positive for: Neurological, Skin, Genitourinary, Musculoskeletal, Endocrine, Eyes Negative for: Constitutional, Gastrointestinal, HENT, Cardiovascular, Respiratory, Psychiatric, Allergic/Imm, Heme/Lymph Last edited by Theodore Demark, COA on 02/24/2021  2:14 PM.     ALLERGIES No Known Allergies  PAST MEDICAL HISTORY Past Medical History:  Diagnosis Date   Anemia    Arthritis    Basal cell carcinoma 11/23/2017   left elbow, right neck TX CX3 5FU    Basal cell carcinoma 04/04/2019   infil-right sideburn (CX35FU)   Chronic kidney disease    stage 3 ckd stage 3 b per dr Ashby Dawes 09-26-2019 note on chart   Complication of anesthesia    deifficulty waking up   COVID-19    Diabetic neuropathy ([place])    Hyperlipidemia    Hypertension    Hypertensive retinopathy    OU   Infiltrative basal cell carcinoma (BCC) 04/22/2020   Right Zygomatic Area     Macular degeneration    OU   Nodular basal cell carcinoma (BCC) 04/22/2020   Right Anterior Neck   Pneumonia    Right carotid bruit per dr Ashby Dawes 09-26-2019 note on chart   SCC (squamous cell carcinoma) 11/23/2017   right forehead TX CX3 5FU   SCCA (squamous cell carcinoma) of skin 04/22/2020   Mid Parietal Scalp (in situ)   SCCA (squamous cell carcinoma) of skin 04/22/2020   Right Dorsal Hand (in situ)   Type 2 DM with CKD stage 3 and hypertension ([place])    Past Surgical History:  Procedure Laterality Date   ABDOMINAL HYSTERECTOMY     AMPUTATION TOE Right 09/28/2019   Procedure: Right 2nd toe amputation - partial;  Surgeon: Evelina Bucy, DPM;  Location: [facility];  Service: Podiatry;  Laterality:  Right;   CATARACT EXTRACTION     CHOLECYSTECTOMY     EYE SURGERY     HERNIA REPAIR     SPINE SURGERY     TONSILLECTOMY      FAMILY HISTORY History reviewed. No pertinent family history.  SOCIAL HISTORY Social History   Tobacco Use   Smoking status: Former   Smokeless tobacco: Never  Scientific laboratory technician Use: Never used  Substance Use Topics   Alcohol use: No    Alcohol/week: 0.0 standard drinks   Drug use: No       OPHTHALMIC EXAM:  Base Eye Exam     Visual Acuity (Snellen - Linear)       Right Left   Dist cc 20/50 -1 20/50 -2   Dist ph cc 20/50 +1 NI    Correction: Glasses         Tonometry (Tonopen, 2:10 PM)       Right Left   Pressure 19 15         Pupils       Dark Light Shape React APD   Right 3 2 Round Brisk None   Left 3 2 Round Brisk None         Visual Fields (Counting fingers)       Left Right    Full Full         Extraocular Movement       Right Left    Full, Ortho Full, Ortho         Neuro/Psych     Oriented x3: Yes   Mood/Affect: Normal         Dilation     Both eyes: 1.0% Mydriacyl, 2.5% Phenylephrine @ 2:10 PM           Slit Lamp and Fundus Exam     Slit Lamp Exam       Right  Left   Lids/Lashes Dermatochalasis - upper lid, Telangiectasia, mild MGD Dermatochalasis - upper lid, Telangiectasia, Meibomian gland dysfunction   Conjunctiva/Sclera White and quiet White and quiet   Cornea Temporal Well healed cataract wounds, mild Arcus, trace endo pigment nasally, 1+PEE Arcus, Temporal Well healed cataract wounds, 1-2+ PEE   Anterior Chamber Deep and quiet Deep and quiet   Iris Round and moderately dilated to 4.44mm Round and moderately dilated to 26mm, Iris atrophy from 0100 to 0230, peripheral irodotimy at 0200   Lens 3-piece PC IOL in good position PC IOL in good position   Anterior Vitreous Vitreous syneresis, Posterior vitreous detachment, Weiss ring, vitreous condensations Vitreous syneresis, PVD, vitreous condensations         Fundus Exam       Right Left   Disc 360 Peripapillary atrophy, diffuse mild pallor, compact, sharp rim mild pallor, Peripapillary atrophy and pigmentation   C/D Ratio 0.1 0.2   Macula Blunted foveal reflex, +PED, RPE mottling, clumping and early atrophy, Drusen, No heme or edema Blunted foveal reflex, low lying PED / +CNVM with stable improvement in IRF, interval improvement in SRF, RPE mottling and clumping, Drusen, No heme   Vessels Vascular attenuation Vascular attenuation   Periphery Attached, 360 Reticular degeneration Attached, 360 Reticular degeneration           Refraction     Wearing Rx       Sphere Cylinder Axis   Right -1.50 +0.75 180   Left -0.50 +0.75 180            IMAGING AND PROCEDURES  Imaging and Procedures for @TODAY @  OCT, Retina - OU - Both Eyes       Right Eye Quality was good. Central Foveal Thickness: 232. Progression has been stable. Findings include normal foveal contour, no IRF, no SRF, pigment epithelial detachment, outer retinal atrophy, retinal drusen (persistent PED).   Left Eye Quality was good. Central Foveal Thickness: 255. Progression has improved. Findings include no IRF, retinal  drusen , pigment epithelial detachment, outer retinal atrophy, epiretinal membrane, abnormal foveal contour, no SRF (Interval improvement in SRF overlying stable, low PED, partial PVD).   Notes *Images captured and stored on drive  Diagnosis / Impression:  OD: Non-Exudative ARMD -- stable OS: Exudative ARMD -- Interval improvement in SRF overlying stable, low PED, partial PVD  Clinical management:  See below  Abbreviations: NFP - Normal foveal profile. CME - cystoid macular edema. PED - pigment epithelial detachment. IRF - intraretinal fluid. SRF - subretinal fluid. EZ - ellipsoid zone. ERM - epiretinal membrane. ORA - outer retinal atrophy. ORT - outer retinal tubulation. SRHM - subretinal hyper-reflective material       Intravitreal Injection, Pharmacologic Agent - OS - Left Eye       Time Out 02/24/2021. 3:06 PM. Confirmed correct patient, procedure, site, and patient consented.   Anesthesia Topical anesthesia was used. Anesthetic medications included Lidocaine 2%, Proparacaine 0.5%.   Procedure Preparation included 5% betadine to ocular surface, eyelid speculum. A (32g) needle was used.   Injection: 1.25 mg Bevacizumab 1.25mg /0.68ml   Route: Intravitreal, Site: Left Eye   NDC: H061816, Lot: [id number], Expiration date: 04/06/2021   Post-op Post injection exam found visual acuity of at least counting fingers. The patient tolerated the procedure well. There were no complications. The patient received written and verbal post procedure care education. Post injection medications were not given.            ASSESSMENT/PLAN:    ICD-10-CM   1. Exudative age-related macular degeneration of left eye with active choroidal neovascularization (HCC)  H35.3221 OCT, Retina - OU - Both Eyes    Intravitreal Injection, Pharmacologic Agent - OS - Left Eye    Bevacizumab (AVASTIN) SOLN 1.25 mg    2. Intermediate stage nonexudative age-related macular degeneration of right eye   H35.3112     3. Diabetes mellitus type 2 without retinopathy ([place])  E11.9     4. Essential hypertension  I10     5. Hypertensive retinopathy of both eyes  H35.033     6. Pseudophakia of both eyes  Z96.1      1. Exudative age-related macular degeneration, left eye  - conversion of OS from nonexudative to exudative ARMD prior to 02.05.20 visit  - s/p IVA OS #1 (02.05.20), #2 (03.04.20), #3 (04.30.20), #4 (06.02.20), #5 (07.14.20), #6 (9.1.20), #7 (10.27.20), #8 (12.23.20), #9 (02.03.21), #10 (03.17.21), #11 (04.28.21), #12 (06.09.21), #13 (08.04.21), #14 (09.29.21), #15 (11.22.2021), #16 (01.26.22), #17 (04.06.22), #18 (06.28.22), #19 (9.13.22), #20 (11.29.22)  - OCT shows Interval improvement in SRF overlying stable, low PED at 9 wks   - BCVA stable at 20/50  - recommend IVA #21 OS today, 1.31.23 w/ f/u in 9 wks  - pt wishes to be treated with IVA OS  - risks and benefits of intravitreal avastin discussed with patient  - informed consent obtained  - see procedure note  - Avastin informed consent obtained and scanned 02.03.21  - f/u in 9 wks for DFE, OCT, likely injection OS   2. Age related macular degeneration, non-exudative, right  eye  - stable, former pt of Dr. Zigmund Daniel -- no significant change in OCT or exam  - The incidence, anatomy, and pathology of dry AMD, risk of progression, and the AREDS and AREDS 2 study including smoking risks discussed with patient.  - recommend amsler grid monitoring  3. Diabetes mellitus, type 2 without retinopathy  - The incidence, risk factors for progression, natural history and treatment options for diabetic retinopathy  were discussed with patient.    - The need for close monitoring of blood glucose, blood pressure, and serum lipids, avoiding cigarette or any type of tobacco, and the need for long term follow up was also discussed with patient.  - f/u in 1 year  4,5. Hypertensive retinopathy OU  - discussed importance of tight BP control  -  monitor  6. Pseudophakia OU  - s/p CE/IOL OU  - beautiful surgeries, doing well  - monitor  Ophthalmic Meds Ordered this visit:  Meds ordered this encounter  Medications   Bevacizumab (AVASTIN) SOLN 1.25 mg    Return in about 9 weeks (around 04/28/2021) for Exu ARMD OS w/DFE/OCT/likely IVA OS.  There are no Patient Instructions on file for this visit.   This document serves as a record of services personally performed by Gardiner Sleeper, MD, PhD. It was created on their behalf by Estill Bakes, COT an ophthalmic technician. The creation of this record is the provider's dictation and/or activities during the visit.    Electronically signed by: Estill Bakes, COT 1.30.23 @ 2:32 AM   This document serves as a record of services personally performed by Gardiner Sleeper, MD, PhD. It was created on their behalf by Estill Bakes, COT an ophthalmic technician. The creation of this record is the provider's dictation and/or activities during the visit.    Electronically signed by: Estill Bakes, COT 1.31.23 @ 2:32 AM   Gardiner Sleeper, M.D., Ph.D. Diseases & Surgery of the Retina and [facility] 1.31.23  I have reviewed the above documentation for accuracy and completeness, and I agree with the above. Gardiner Sleeper, M.D., Ph.D. 02/27/21 2:33 AM   Abbreviations: M myopia (nearsighted); A astigmatism; H hyperopia (farsighted); P presbyopia; Mrx spectacle prescription;  CTL contact lenses; OD right eye; OS left eye; OU both eyes  XT exotropia; ET esotropia; PEK punctate epithelial keratitis; PEE punctate epithelial erosions; DES dry eye syndrome; MGD meibomian gland dysfunction; ATs artificial tears; PFAT's preservative free artificial tears; Sanders nuclear sclerotic cataract; PSC posterior subcapsular cataract; ERM epi-retinal membrane; PVD posterior vitreous detachment; RD retinal detachment; DM diabetes mellitus; DR diabetic retinopathy; NPDR non-proliferative  diabetic retinopathy; PDR proliferative diabetic retinopathy; CSME clinically significant macular edema; DME diabetic macular edema; dbh dot blot hemorrhages; CWS cotton wool spot; POAG primary open angle glaucoma; C/D cup-to-disc ratio; HVF humphrey visual field; GVF goldmann visual field; OCT optical coherence tomography; IOP intraocular pressure; BRVO Branch retinal vein occlusion; CRVO central retinal vein occlusion; CRAO central retinal artery occlusion; BRAO branch retinal artery occlusion; RT retinal tear; SB scleral buckle; PPV pars plana vitrectomy; VH Vitreous hemorrhage; PRP panretinal laser photocoagulation; IVK intravitreal kenalog; VMT vitreomacular traction; MH Macular hole;  NVD neovascularization of the disc; NVE neovascularization elsewhere; AREDS age related eye disease study; ARMD age related macular degeneration; POAG primary open angle glaucoma; EBMD epithelial/anterior basement membrane dystrophy; ACIOL anterior chamber intraocular lens; IOL intraocular lens; PCIOL posterior chamber intraocular lens; Phaco/IOL phacoemulsification with intraocular lens placement; PRK photorefractive keratectomy; LASIK laser assisted in  situ keratomileusis; HTN hypertension; DM diabetes mellitus; COPD chronic obstructive pulmonary disease

## 2021-02-24 ENCOUNTER — Encounter (INDEPENDENT_AMBULATORY_CARE_PROVIDER_SITE_OTHER): Payer: Self-pay | Admitting: Ophthalmology

## 2021-02-24 ENCOUNTER — Other Ambulatory Visit: Payer: Self-pay

## 2021-02-24 ENCOUNTER — Ambulatory Visit (INDEPENDENT_AMBULATORY_CARE_PROVIDER_SITE_OTHER): Payer: Medicare Other | Admitting: Ophthalmology

## 2021-02-24 DIAGNOSIS — Z961 Presence of intraocular lens: Secondary | ICD-10-CM

## 2021-02-24 DIAGNOSIS — H353112 Nonexudative age-related macular degeneration, right eye, intermediate dry stage: Secondary | ICD-10-CM | POA: Diagnosis not present

## 2021-02-24 DIAGNOSIS — H353221 Exudative age-related macular degeneration, left eye, with active choroidal neovascularization: Secondary | ICD-10-CM

## 2021-02-24 DIAGNOSIS — H35033 Hypertensive retinopathy, bilateral: Secondary | ICD-10-CM

## 2021-02-24 DIAGNOSIS — I10 Essential (primary) hypertension: Secondary | ICD-10-CM | POA: Diagnosis not present

## 2021-02-24 DIAGNOSIS — E119 Type 2 diabetes mellitus without complications: Secondary | ICD-10-CM

## 2021-02-26 ENCOUNTER — Ambulatory Visit (INDEPENDENT_AMBULATORY_CARE_PROVIDER_SITE_OTHER): Payer: Medicare Other | Admitting: Dermatology

## 2021-02-26 ENCOUNTER — Other Ambulatory Visit: Payer: Self-pay

## 2021-02-26 DIAGNOSIS — C4441 Basal cell carcinoma of skin of scalp and neck: Secondary | ICD-10-CM | POA: Diagnosis not present

## 2021-02-26 DIAGNOSIS — D0461 Carcinoma in situ of skin of right upper limb, including shoulder: Secondary | ICD-10-CM | POA: Diagnosis not present

## 2021-02-26 DIAGNOSIS — D044 Carcinoma in situ of skin of scalp and neck: Secondary | ICD-10-CM | POA: Diagnosis not present

## 2021-02-26 DIAGNOSIS — L570 Actinic keratosis: Secondary | ICD-10-CM | POA: Diagnosis not present

## 2021-02-26 DIAGNOSIS — D485 Neoplasm of uncertain behavior of skin: Secondary | ICD-10-CM

## 2021-02-26 NOTE — Patient Instructions (Signed)

## 2021-02-27 ENCOUNTER — Encounter (INDEPENDENT_AMBULATORY_CARE_PROVIDER_SITE_OTHER): Payer: Self-pay | Admitting: Ophthalmology

## 2021-02-27 MED ORDER — BEVACIZUMAB CHEMO INJECTION 1.25MG/0.05ML SYRINGE FOR KALEIDOSCOPE
1.2500 mg | INTRAVITREAL | Status: AC | PRN
  Administered 2021-02-27: 1.25 mg via INTRAVITREAL

## 2021-03-21 ENCOUNTER — Encounter: Payer: Self-pay | Admitting: Dermatology

## 2021-03-21 NOTE — Progress Notes (Signed)
Follow-Up Visit   Subjective  Sue [name] is a 88 y.o. female who presents for the following: Procedure.  2 biopsy-proven nonmelanoma skin cancers plus multiple other crusts Location:  Duration:  Quality:  Associated Signs/Symptoms: Modifying Factors:  Severity:  Timing: Context:   Objective  Well appearing patient in no apparent distress; mood and affect are within normal limits. Right Anterior Neck Lesion identified by Dr.Lucynda Rosano and nurse in room.    Mid Parietal Scalp Lesion identified by Dr.Lavoris Sparling and nurse in room.    Left Parotid Area Hornlike 3 mm crust  Right Proximal Hand 8 mm thick crust  Right Dorsal Hand - Inferior 8 mm thick crust  Right Dorsal Hand - Mid 9 mm thick crust    A focused examination was performed including head, neck, hands. Relevant physical exam findings are noted in the Assessment and Plan.   Assessment & Plan    Basal cell carcinoma (BCC) of neck Right Anterior Neck  Destruction of lesion Complexity: simple   Destruction method: electrodesiccation and curettage   Informed consent: discussed and consent obtained   Timeout:  patient name, date of birth, surgical site, and procedure verified Anesthesia: the lesion was anesthetized in a standard fashion   Anesthetic:  1% lidocaine w/ epinephrine 1-100,000 local infiltration Curettage performed in three different directions: Yes   Curettage cycles:  3 Lesion length (cm):  1 Lesion width (cm):  1 Margin per side (cm):  0 Final wound size (cm):  1 Hemostasis achieved with:  ferric subsulfate Outcome: patient tolerated procedure well with no complications   Post-procedure details: sterile dressing applied and wound care instructions given   Dressing type: bandage and petrolatum   Additional details:  Wound inoculated with 5% Fluorouracil.    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  Scalp  Destruction of lesion Complexity: simple   Destruction method: electrodesiccation and curettage   Informed consent: discussed and consent obtained   Timeout:  patient name, date of birth, surgical site, and procedure verified Anesthesia: the lesion was anesthetized in a standard fashion   Anesthetic:  1% lidocaine w/ epinephrine 1-100,000 local infiltration Curettage performed in three different directions: Yes   Electrodesiccation performed over the curetted area: Yes   Curettage cycles:  3 Lesion length (cm):  1.7 Lesion width (cm):  1.7 Margin per side (cm):  0 Final wound size (cm):  1.7 Hemostasis achieved with:  ferric subsulfate Outcome: patient tolerated procedure well with no complications   Post-procedure details: sterile dressing applied and wound care instructions given   Dressing type: bandage and petrolatum   Additional details:  Wound inoculated with 5% Fluorouracil.    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 (3) Right Proximal Hand  Skin / nail biopsy Type of biopsy: tangential   Informed consent: discussed and consent obtained   Timeout: patient name, date of birth, surgical site, and procedure verified   Anesthesia: the lesion was anesthetized in a standard fashion  Anesthetic:  1% lidocaine w/ epinephrine 1-100,000 local infiltration Instrument used: flexible razor blade   Hemostasis achieved with: ferric subsulfate and electrodesiccation   Outcome: patient tolerated  procedure well   Post-procedure details: wound care instructions given    Destruction of lesion Complexity: simple   Destruction method: electrodesiccation and curettage   Informed consent: discussed and consent obtained   Timeout:  patient name, date of birth, surgical site, and procedure verified Anesthesia: the lesion was anesthetized in a standard fashion   Anesthetic:  1% lidocaine w/ epinephrine 1-100,000 local infiltration Curettage performed in three different directions: Yes     Electrodesiccation performed over the curetted area: No   Curettage cycles:  3 Lesion length (cm):  0.9 Lesion width (cm):  0.9 Margin per side (cm):  0 Final wound size (cm):  0.9 Hemostasis achieved with:  ferric subsulfate Outcome: patient tolerated procedure well with no complications   Post-procedure details: sterile dressing applied and wound care instructions given   Dressing type: bandage and petrolatum   Additional details:  Wound inoculated with 5% Fluorouracil.    Specimen 3 - Surgical pathology Differential Diagnosis: R/O BCC VS SCC - TXPBX [id number] Check Margins: No  Right Dorsal Hand - Inferior  Skin / nail biopsy Type of biopsy: tangential   Informed consent: discussed and consent obtained   Timeout: patient name, date of birth, surgical site, and procedure verified   Anesthesia: the lesion was anesthetized in a standard fashion   Anesthetic:  1% lidocaine w/ epinephrine 1-100,000 local infiltration Instrument used: flexible razor blade   Hemostasis achieved with: ferric subsulfate and electrodesiccation   Outcome: patient tolerated procedure well   Post-procedure details: wound care instructions given    Destruction of lesion Complexity: simple   Destruction method: electrodesiccation and curettage   Informed consent: discussed and consent obtained   Timeout:  patient name, date of birth, surgical site, and procedure verified Anesthesia: the lesion was anesthetized in  a standard fashion   Anesthetic:  1% lidocaine w/ epinephrine 1-100,000 local infiltration Curettage performed in three different directions: Yes     Electrodesiccation performed over the curetted area: No   Curettage cycles:  3 Lesion length (cm):  0.8 Lesion width (cm):  0.8 Margin per side (cm):  0 Final wound size (cm):  0.8 Hemostasis achieved with:  ferric subsulfate Outcome: patient tolerated procedure well with no complications   Post-procedure details: sterile dressing applied and wound care instructions given   Dressing type: bandage and petrolatum   Additional details:  Wound inoculated with 5% Fluorouracil.    Specimen 1 - Surgical pathology Differential Diagnosis: R/O BCC VS SCC - TXPBX [id number] Check Margins: No  Right Dorsal Hand - Mid  Skin / nail biopsy Type of biopsy: tangential   Informed consent: discussed and consent obtained   Timeout: patient name, date of birth, surgical site, and procedure verified   Anesthesia: the lesion was anesthetized in a standard fashion   Anesthetic:  1% lidocaine w/ epinephrine 1-100,000 local infiltration Instrument used: flexible razor blade   Hemostasis achieved with: ferric subsulfate and electrodesiccation   Outcome: patient tolerated procedure well   Post-procedure details: wound care instructions given    Destruction of lesion Complexity: simple   Destruction method: electrodesiccation and curettage   Informed consent: discussed and consent obtained   Timeout:  patient name, date of birth, surgical site, and procedure verified Anesthesia: the lesion was anesthetized in a standard fashion   Anesthetic:  1% lidocaine w/ epinephrine  1-100,000 local infiltration Curettage performed in three different directions: Yes     Electrodesiccation performed over the curetted area: No   Curettage cycles:  3 Lesion length (cm):  1.2 Lesion width (cm):  1.2 Margin per side (cm):  0 Final wound size (cm):  1.2 Hemostasis  achieved with:  ferric subsulfate Outcome: patient tolerated procedure well with no complications   Post-procedure details: sterile dressing applied and wound care instructions given   Dressing type: bandage and petrolatum   Additional details:  Wound inoculated with 5% Fluorouracil.    Specimen 2 - Surgical pathology Differential Diagnosis: R/O BCC VS SCC - TXPBX [id number] Check Margins: No      I, Lavonna Monarch, MD, have reviewed all documentation for this visit.  The documentation on 03/21/21 for the exam, diagnosis, procedures, and orders are all accurate and complete.

## 2021-03-26 ENCOUNTER — Encounter: Payer: Self-pay | Admitting: Podiatry

## 2021-03-26 ENCOUNTER — Ambulatory Visit: Payer: Medicare Other | Admitting: Podiatry

## 2021-03-26 ENCOUNTER — Other Ambulatory Visit: Payer: Self-pay

## 2021-03-26 ENCOUNTER — Telehealth: Payer: Self-pay

## 2021-03-26 ENCOUNTER — Ambulatory Visit: Payer: Medicare Other

## 2021-03-26 DIAGNOSIS — M79675 Pain in left toe(s): Secondary | ICD-10-CM | POA: Diagnosis not present

## 2021-03-26 DIAGNOSIS — M79674 Pain in right toe(s): Secondary | ICD-10-CM

## 2021-03-26 DIAGNOSIS — B351 Tinea unguium: Secondary | ICD-10-CM

## 2021-03-26 DIAGNOSIS — M2041 Other hammer toe(s) (acquired), right foot: Secondary | ICD-10-CM

## 2021-03-26 DIAGNOSIS — Z89421 Acquired absence of other right toe(s): Secondary | ICD-10-CM

## 2021-03-26 DIAGNOSIS — L84 Corns and callosities: Secondary | ICD-10-CM

## 2021-03-26 DIAGNOSIS — M2060 Acquired deformities of toe(s), unspecified, unspecified foot: Secondary | ICD-10-CM

## 2021-03-26 DIAGNOSIS — M2142 Flat foot [pes planus] (acquired), left foot: Secondary | ICD-10-CM

## 2021-03-26 DIAGNOSIS — E1142 Type 2 diabetes mellitus with diabetic polyneuropathy: Secondary | ICD-10-CM | POA: Diagnosis not present

## 2021-03-26 DIAGNOSIS — Z872 Personal history of diseases of the skin and subcutaneous tissue: Secondary | ICD-10-CM

## 2021-03-26 DIAGNOSIS — M2141 Flat foot [pes planus] (acquired), right foot: Secondary | ICD-10-CM

## 2021-03-26 NOTE — Telephone Encounter (Signed)
Casts sent to [employer] - Hold for CMN ?

## 2021-03-26 NOTE — Progress Notes (Signed)
?  Subjective:  ?Patient ID: Sue [name], female    DOB: 29-Jan-1932,  MRN: [id number] ? ?Chief Complaint  ?Patient presents with  ? Nail Problem  ?  Thick painful toenails, 8 week follow up  ? Diabetes  ?  At risk diabetic foot care  ? ? ?88 y.o. female presents with the above complaint. History confirmed with patient.  Nails are thickened elongated and causing pain.  She also is painful callus on the right foot.  Her A1c is 8.8%.  She does have some numbness and tingling in the feet.  She typically sees Dr. Elisha Ponder and Dr. Prudence Davidson for routine debridements of these have been very helpful.  She had a partial toe amputation of the right second toe previously with Dr. March Rummage as well. ? ?Objective:  ?Physical Exam: ?warm, good capillary refill, no trophic changes or ulcerative lesions, normal DP and PT pulses, and diffuse neuropathy, right second toe prior partial amputation which is well-healed there is a preulcerative callus submetatarsal 2 on the right foot, thickened elongated toenails x10 with yellow discoloration and brittle subungual crumbly debris ? ?Assessment:  ? ?1. Pain due to onychomycosis of toenails of both feet   ?2. Callus   ?3. Type 2 diabetes mellitus with polyneuropathy (HCC)   ?4. Pes planus of both feet   ? ? ? ?Plan:  ?Patient was evaluated and treated and all questions answered. ? ?Patient educated on diabetes. Discussed proper diabetic foot care and discussed risks and complications of disease. Educated patient in depth on reasons to return to the office immediately should he/she discover anything concerning or new on the feet. All questions answered. Discussed proper shoes as well.  ? ?All symptomatic hyperkeratoses were safely debrided with a sterile #15 blade to patient's level of comfort without incident. We discussed preventative and palliative care of these lesions including supportive and accommodative shoegear, padding, prefabricated and custom molded accommodative orthoses, use of a  pumice stone and lotions/creams daily. ? ?She was seen by orthotist today and fitted for custom molded diabetic inserts with extra-depth shoe to alleviate pain and pressure and offload the preulcerative callus. ? ?No follow-ups on file.  ? ?

## 2021-03-26 NOTE — Progress Notes (Signed)
SITUATION ?Reason for Consult: Evaluation for Prefabricated Diabetic Shoes and Custom Diabetic Inserts. ?Patient / Caregiver Report: Patient would like well fitting shoes ? ?OBJECTIVE DATA: ?Patient History / Diagnosis:  ?  ICD-10-CM   ?1. Diabetic peripheral neuropathy associated with type 2 diabetes mellitus ([place])  E11.42   ?  ?2. 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  ?3. Healed ulcer of right foot on examination  Z87.2   ?  ?4. Acquired hammertoes of both feet  M20.41   ? M20.42   ?  ?5. Acquired deformity of toe, unspecified laterality  M20.60   ?  ? ? ?Current or Previous Devices:   Apex sneakers ? ?In-Person Foot Examination: ?Ulcers & Callousing:   Historical ? ?Deformities:   ?- Hammertoes ?  ?Shoe Size: 9W ? ?ORTHOTIC RECOMMENDATION ?Recommended Devices: ?- 1x pair prefabricated PDAC approved diabetic shoes; Patient Selected - Bland Span 846 Size 9W ?- 3x pair custom-to-patient PDAC approved vacuum formed diabetic insoles. ? ?GOALS OF SHOES AND INSOLES ?- Reduce shear and pressure ?- Reduce / Prevent callus formation ?- Reduce / Prevent ulceration ?- Protect the fragile healing compromised diabetic foot. ? ?Patient would benefit from diabetic shoes and inserts as patient has diabetes mellitus and the patient has one or more of the following conditions: ?- History of partial or complete amputation of the foot ?- History of previous foot ulceration. ?- History of pre-ulcerative callus ?- Peripheral neuropathy with evidence of callus formation ?- Foot deformity ?- Poor circulation ? ?ACTIONS PERFORMED ?Patient was casted for insoles via crush box and measured for shoes via brannock device. Procedure was explained and patient tolerated procedure well. All questions were answered and concerns addressed. ? ?PLAN ?Patient is to be contacted and scheduled for fitting once CMN is obtained from treaing physician and shoes and insoles have been fabricated and received. ? ?

## 2021-04-08 DIAGNOSIS — E1165 Type 2 diabetes mellitus with hyperglycemia: Secondary | ICD-10-CM | POA: Diagnosis not present

## 2021-04-09 ENCOUNTER — Other Ambulatory Visit: Payer: Self-pay

## 2021-04-09 ENCOUNTER — Ambulatory Visit (INDEPENDENT_AMBULATORY_CARE_PROVIDER_SITE_OTHER): Payer: Medicare Other | Admitting: Dermatology

## 2021-04-09 DIAGNOSIS — Z85828 Personal history of other malignant neoplasm of skin: Secondary | ICD-10-CM | POA: Diagnosis not present

## 2021-04-09 DIAGNOSIS — D044 Carcinoma in situ of skin of scalp and neck: Secondary | ICD-10-CM

## 2021-04-09 NOTE — Patient Instructions (Signed)

## 2021-04-14 DIAGNOSIS — N1832 Chronic kidney disease, stage 3b: Secondary | ICD-10-CM | POA: Diagnosis not present

## 2021-04-14 DIAGNOSIS — E1142 Type 2 diabetes mellitus with diabetic polyneuropathy: Secondary | ICD-10-CM | POA: Diagnosis not present

## 2021-04-14 DIAGNOSIS — L84 Corns and callosities: Secondary | ICD-10-CM | POA: Diagnosis not present

## 2021-04-14 DIAGNOSIS — I129 Hypertensive chronic kidney disease with stage 1 through stage 4 chronic kidney disease, or unspecified chronic kidney disease: Secondary | ICD-10-CM | POA: Diagnosis not present

## 2021-04-14 DIAGNOSIS — E782 Mixed hyperlipidemia: Secondary | ICD-10-CM | POA: Diagnosis not present

## 2021-04-15 ENCOUNTER — Ambulatory Visit: Payer: Medicare Other | Admitting: Podiatry

## 2021-04-22 ENCOUNTER — Encounter: Payer: Self-pay | Admitting: Dermatology

## 2021-04-22 NOTE — Progress Notes (Signed)
? ?  Follow-Up Visit ?  ?Subjective  ?Sue [name] is a 88 y.o. female who presents for the following: Procedure (Here for possible skin cancer treatment. ). ? ?Skin cancer scalp, check previous skin cancers. ?Location:  ?Duration:  ?Quality:  ?Associated Signs/Symptoms: ?Modifying Factors:  ?Severity:  ?Timing: ?Context:  ? ?Objective  ?Well appearing patient in no apparent distress; mood and affect are within normal limits. ?Mid Parietal Scalp ?Lesion identified by Dr.Danajah Birdsell and nurse in room.   ? ?Left Forearm - Anterior, Neck - Anterior, Right Dorsal Hand ?Small residual crust on right dorsal hand, more visible superficial pink 4 mm scale above and below the scar on her right neck. ? ? ? ? ? ? ? ? ? ? ? ? ?A focused examination was performed including head, neck, arms. Relevant physical exam findings are noted in the Assessment and Plan. ? ? ?Assessment & Plan  ? ? ?Squamous cell carcinoma in situ of scalp ?Mid Parietal Scalp ? ?Destruction of lesion ?Complexity: simple   ?Destruction method: electrodesiccation and curettage   ?Informed consent: discussed and consent obtained   ?Timeout:  patient name, date of birth, surgical site, and procedure verified ?Anesthesia: the lesion was anesthetized in a standard fashion   ?Anesthetic:  1% lidocaine w/ epinephrine 1-100,000 local infiltration ?Curettage performed in three different directions: Yes   ?Curettage cycles:  3 ?Lesion length (cm):  0.5 ?Lesion width (cm):  0.5 ?Margin per side (cm):  0 ?Final wound size (cm):  0.5 ?Hemostasis achieved with:  ferric subsulfate ?Outcome: patient tolerated procedure well with no complications   ?Post-procedure details: sterile dressing applied and wound care instructions given   ?Dressing type: bandage and petrolatum   ?Additional details:  Wound inoculated with 5% Fluorouracil.   ? ?Personal history of other malignant neoplasm of skin (3) ?Neck - Anterior; Left Forearm - Anterior; Right Dorsal Hand ? ?These are causing her  no discomfort and she agrees with leaving these be unless there is clinical change. ? ? ? ? ? ?I, Lavonna Monarch, MD, have reviewed all documentation for this visit.  The documentation on 04/22/21 for the exam, diagnosis, procedures, and orders are all accurate and complete. ?

## 2021-04-24 ENCOUNTER — Telehealth: Payer: Self-pay

## 2021-04-24 NOTE — Telephone Encounter (Signed)
Shoes ordered - Karl Ito [phone] ?

## 2021-04-27 NOTE — Progress Notes (Signed)
?[facility] Note ? ?04/28/2021 ? ? CHIEF COMPLAINT ?Patient presents for Retina Follow Up ? ?HISTORY OF PRESENT ILLNESS: ?Sue [name] is a 88 y.o. female who presents to the clinic today for:  ?HPI   ? ? Retina Follow Up   ?Patient presents with  Wet AMD.  In left eye.  Severity is moderate.  Duration of 9 weeks.  Since onset it is stable.  I, the attending physician,  performed the HPI with the patient and updated documentation appropriately. ? ?  ?  ? ? Comments   ?Patient states vision seems the same OU. Needs more help with reading small print. BS was 78 this am. Last a1c was slightly above 8.  ? ?  ?  ?Last edited by Bernarda Caffey, MD on 04/30/2021 12:58 AM.  ?  ? ?Referring physician: ?Merrilee Seashore, MD ?[place],   [id number] ? ?HISTORICAL INFORMATION:  ? ?Selected notes from the [facility] ?Referred by Dr. Quentin Ore for concern of ARMD OU  ? ?CURRENT MEDICATIONS: ?Current Outpatient Medications (Ophthalmic Drugs)  ?Medication Sig  ? RESTASIS 0.05 % ophthalmic emulsion Place 1 drop into both eyes 2 (two) times daily.  ? Polyethyl Glycol-Propyl Glycol (SYSTANE) 0.4-0.3 % SOLN Place 1 drop into both eyes 3 (three) times daily as needed (dry eyes). (Patient not taking: Reported on 04/28/2021)  ? ?No current facility-administered medications for this visit. (Ophthalmic Drugs)  ? ?Current Outpatient Medications (Other)  ?Medication Sig  ? amLODipine (NORVASC) 10 MG tablet Take 10 mg by mouth every morning.   ? B-D ULTRAFINE III SHORT PEN 31G X 8 MM MISC USE AS DIRECTED ONCE DAILY SUBCUTANEOUSLY  ? Calcium Citrate-Vitamin D (CALCIUM + D PO)   ? calcium-vitamin D (OSCAL WITH D) 500-200 MG-UNIT tablet Take 1 tablet by mouth 2 (two) times daily.   ? cephALEXin (KEFLEX) 500 MG capsule Take 1 capsule (500 mg total) by mouth 2 (two) times daily.  ? Continuous Blood Gluc Sensor (FREESTYLE LIBRE 2 SENSOR) MISC See admin instructions.  ?  Cyanocobalamin (B-12 PO)   ? diphenhydramine-acetaminophen (TYLENOL PM) 25-500 MG TABS tablet Take 1 tablet by mouth at bedtime as needed (sleep).  ? Glucagon (GVOKE HYPOPEN 2-PACK) 0.5 MG/0.1ML SOAJ as needed for hypoglycemia  ? glucose blood test strip OneTouch Ultra Test strips ? TEST ONCE A DAY ONCE A DAY FINGERSTICK 90  ? hydrALAZINE (APRESOLINE) 25 MG tablet Take 3 tablets (75 mg total) by mouth every 8 (eight) hours.  ? hydrochlorothiazide (HYDRODIURIL) 25 MG tablet Take 25 mg by mouth daily.  ? ibuprofen (ADVIL) 200 MG tablet Take 400 mg by mouth every 6 (six) hours as needed for moderate pain.  ? Insulin Degludec (TRESIBA FLEXTOUCH) 200 UNIT/ML SOPN Inject 36 Units into the skin at bedtime. (Patient taking differently: Inject 22 Units into the skin at bedtime.)  ? Insulin Pen Needle (PEN NEEDLES) 32G X 4 MM MISC BD Ultra-Fine Nano Pen Needle 32 gauge x 5/32" ? USE AS DIRECTED DAILY  ? Lancets (ONETOUCH ULTRASOFT) lancets OneTouch UltraSoft Lancets ? USE TWICE DAILY  ? Melatonin 10 MG CAPS Take 10 mg by mouth at bedtime as needed (sleep).  ? Multiple Vitamins-Minerals (ADULT ONE DAILY GUMMIES) CHEW Chew 1 tablet by mouth 2 (two) times daily. Centrum  ? Multiple Vitamins-Minerals (AIRBORNE) TBEF Take 1 tablet by mouth daily as needed (immune support).   ? Multiple Vitamins-Minerals (CENTRUM SILVER PO)   ? Multiple  Vitamins-Minerals (PRESERVISION AREDS 2) CAPS Take 1 capsule by mouth 2 (two) times daily.  ? Omega-3 Fatty Acids (FISH OIL) 1200 MG CAPS Take 1,200 mg by mouth daily with lunch.   ? omeprazole (PRILOSEC) 20 MG capsule Take 20 mg by mouth every morning.  ? ondansetron (ZOFRAN) 4 MG tablet Take 4 mg by mouth 3 (three) times daily as needed.  ? ONE TOUCH ULTRA TEST test strip   ? PRESCRIPTION MEDICATION by Intravitreal route every 30 (thirty) days. Ophthalmic injection at MD office in left eye  ? simvastatin (ZOCOR) 20 MG tablet Take 20 mg by mouth at bedtime.   ? vitamin B-12 (CYANOCOBALAMIN) 1000  MCG tablet Take 1,000 mcg by mouth 2 (two) times daily.   ? JANUVIA 25 MG tablet Take 25 mg by mouth daily. (Patient not taking: Reported on 04/28/2021)  ? ketoconazole (NIZORAL) 2 % cream Apply topically. (Patient not taking: Reported on 04/28/2021)  ? meloxicam (MOBIC) 7.5 MG tablet Take 1 tablet (7.5 mg total) by mouth daily as needed for pain. (Patient not taking: Reported on 04/28/2021)  ? mupirocin ointment (BACTROBAN) 2 % Apply to right foot ulcer once daily. (Patient taking differently: Apply 1 application. topically daily as needed (wound care). Apply to right foot ulcer once daily.)  ? nortriptyline (PAMELOR) 25 MG capsule Take 1 capsule (25 mg total) by mouth at bedtime. (Patient taking differently: Take 25 mg by mouth 2 (two) times daily.)  ? silver sulfADIAZINE (SILVADENE) 1 % cream Apply pea-sized amount to wound daily. (Patient taking differently: Apply 1 application. topically daily. Apply pea-sized amount to wound daily.)  ? traMADol (ULTRAM) 50 MG tablet Take 1 tablet (50 mg total) by mouth 2 (two) times daily as needed. (Patient not taking: Reported on 04/28/2021)  ? ?Current Facility-Administered Medications (Other)  ?Medication Route  ? Bevacizumab (AVASTIN) SOLN 1.25 mg Intravitreal  ? Bevacizumab (AVASTIN) SOLN 1.25 mg Intravitreal  ? ?REVIEW OF SYSTEMS: ?ROS   ?Positive for: Neurological, Skin, Genitourinary, Musculoskeletal, Endocrine, Eyes ?Negative for: Constitutional, Gastrointestinal, HENT, Cardiovascular, Respiratory, Psychiatric, Allergic/Imm, Heme/Lymph ?Last edited by Roselee Nova D, COT on 04/28/2021  1:12 PM.  ?  ? ? ?ALLERGIES ?No Known Allergies ? ?PAST MEDICAL HISTORY ?Past Medical History:  ?Diagnosis Date  ? Anemia   ? Arthritis   ? Basal cell carcinoma 11/23/2017  ? left elbow, right neck TX CX3 5FU   ? Basal cell carcinoma 04/04/2019  ? infil-right sideburn (CX35FU)  ? Chronic kidney disease   ? stage 3 ckd stage 3 b per dr Ashby Dawes 09-26-2019 note on chart  ? Complication of  anesthesia   ? deifficulty waking up  ? COVID-19   ? Diabetic neuropathy ([place])   ? Hyperlipidemia   ? Hypertension   ? Hypertensive retinopathy   ? OU  ? Infiltrative basal cell carcinoma (BCC) 04/22/2020  ? Right Zygomatic Area   ? Macular degeneration   ? OU  ? Nodular basal cell carcinoma (BCC) 04/22/2020  ? Right Anterior Neck  ? Pneumonia   ? Right carotid bruit per dr Ashby Dawes 09-26-2019 note on chart  ? SCC (squamous cell carcinoma) 11/23/2017  ? right forehead TX CX3 5FU  ? SCCA (squamous cell carcinoma) of skin 04/22/2020  ? Mid Parietal Scalp (in situ)  ? SCCA (squamous cell carcinoma) of skin 04/22/2020  ? Right Dorsal Hand (in situ)  ? Type 2 DM with CKD stage 3 and hypertension ([place])   ? ?Past Surgical History:  ?Procedure Laterality Date  ?  ABDOMINAL HYSTERECTOMY    ? AMPUTATION TOE Right 09/28/2019  ? Procedure: Right 2nd toe amputation - partial;  Surgeon: Evelina Bucy, DPM;  Location: [facility];  Service: Podiatry;  Laterality: Right;  ? CATARACT EXTRACTION    ? CHOLECYSTECTOMY    ? EYE SURGERY    ? HERNIA REPAIR    ? SPINE SURGERY    ? TONSILLECTOMY    ? ?FAMILY HISTORY ?History reviewed. No pertinent family history. ? ?SOCIAL HISTORY ?Social History  ? ?Tobacco Use  ? Smoking status: Former  ? Smokeless tobacco: Never  ?Vaping Use  ? Vaping Use: Never used  ?Substance Use Topics  ? Alcohol use: No  ?  Alcohol/week: 0.0 standard drinks  ? Drug use: No  ?  ? ?  ?OPHTHALMIC EXAM: ? ?Base Eye Exam   ? ? Visual Acuity (Snellen - Linear)   ? ?   Right Left  ? Dist cc 20/40 -2 20/40 -1  ? Dist ph cc 20/40 20/40 -1+2  ? ? Correction: Glasses  ? ?  ?  ? ? Tonometry (Tonopen, 1:22 PM)   ? ?   Right Left  ? Pressure 18 18  ? ?  ?  ? ? Pupils   ? ?   Dark Light Shape React APD  ? Right 4 3 Round Brisk None  ? Left 4 3 Round Brisk None  ? ?  ?  ? ? Visual Fields (Counting fingers)   ? ?   Left Right  ?  Full Full  ? ?  ?  ? ? Extraocular Movement   ? ?   Right Left  ?  Full, Ortho Full,  Ortho  ? ?  ?  ? ? Neuro/Psych   ? ? Oriented x3: Yes  ? Mood/Affect: Normal  ? ?  ?  ? ? Dilation   ? ? Both eyes: 1.0% Mydriacyl, 2.5% Phenylephrine @ 1:22 PM  ? ?  ?  ? ?  ? ?Slit Lamp and Fundus Exam   ? ? Slit La

## 2021-04-28 ENCOUNTER — Encounter (INDEPENDENT_AMBULATORY_CARE_PROVIDER_SITE_OTHER): Payer: Self-pay | Admitting: Ophthalmology

## 2021-04-28 ENCOUNTER — Ambulatory Visit (INDEPENDENT_AMBULATORY_CARE_PROVIDER_SITE_OTHER): Payer: Medicare Other | Admitting: Ophthalmology

## 2021-04-28 DIAGNOSIS — I10 Essential (primary) hypertension: Secondary | ICD-10-CM

## 2021-04-28 DIAGNOSIS — H353112 Nonexudative age-related macular degeneration, right eye, intermediate dry stage: Secondary | ICD-10-CM | POA: Diagnosis not present

## 2021-04-28 DIAGNOSIS — H35033 Hypertensive retinopathy, bilateral: Secondary | ICD-10-CM

## 2021-04-28 DIAGNOSIS — H353221 Exudative age-related macular degeneration, left eye, with active choroidal neovascularization: Secondary | ICD-10-CM

## 2021-04-28 DIAGNOSIS — Z961 Presence of intraocular lens: Secondary | ICD-10-CM

## 2021-04-28 DIAGNOSIS — E119 Type 2 diabetes mellitus without complications: Secondary | ICD-10-CM

## 2021-04-30 ENCOUNTER — Encounter (INDEPENDENT_AMBULATORY_CARE_PROVIDER_SITE_OTHER): Payer: Self-pay | Admitting: Ophthalmology

## 2021-04-30 MED ORDER — BEVACIZUMAB CHEMO INJECTION 1.25MG/0.05ML SYRINGE FOR KALEIDOSCOPE
1.2500 mg | INTRAVITREAL | Status: AC | PRN
  Administered 2021-04-30: 1.25 mg via INTRAVITREAL

## 2021-05-11 ENCOUNTER — Ambulatory Visit (INDEPENDENT_AMBULATORY_CARE_PROVIDER_SITE_OTHER): Payer: Medicare Other

## 2021-05-11 ENCOUNTER — Encounter: Payer: Self-pay | Admitting: Podiatry

## 2021-05-11 ENCOUNTER — Ambulatory Visit: Payer: Medicare Other | Admitting: Podiatry

## 2021-05-11 DIAGNOSIS — S90851A Superficial foreign body, right foot, initial encounter: Secondary | ICD-10-CM

## 2021-05-11 DIAGNOSIS — K5901 Slow transit constipation: Secondary | ICD-10-CM | POA: Insufficient documentation

## 2021-05-11 MED ORDER — DOXYCYCLINE HYCLATE 100 MG PO CAPS: 100.0000 mg | ORAL_CAPSULE | Freq: Two times a day (BID) | ORAL | 0 refills | Status: AC

## 2021-05-11 NOTE — Patient Instructions (Signed)
Check your tetanus status with your PCP.  ? ?Right Foot Foreign Body, Adult ?A foreign body is an object that gets into your body that should not be there. The hands and feet are common entry points for foreign bodies. Common examples of foreign bodies include: ?Wood splinters. ?Thorns. ?Pieces of glass, wood, metal, or plastic. ?Metal objects such as needles, nails, and fishing hooks. ?Pencil lead. ?Foreign bodies that are not removed quickly may cause infection. ?What are the causes? ?A foreign body can get in your hand or foot through an injury, such as a scrape or cut, or if something punctures your skin. ?What increases the risk? ?If you work with or around broken glass, wood, fiberglass, or stone you are at a higher risk of getting a foreign body in a hand or foot. Going barefoot also increases your risk of getting a foreign body in a hand or foot. ?What are the signs or symptoms? ?Symptoms of having recently gotten a foreign body in your hand or foot include: ?Pain. ?Redness. ?Swelling. ?Tenderness. ?Numbness or tingling. ?Noticing something under the skin. ?If the foreign body has caused an infection, symptoms may also include: ?Fever. ?Warmth in the area of the puncture. ?A lump that you can feel under the skin. ?Not being able to use your hand or put weight (bear weight) on your foot. ?Greenish or yellow fluid coming from the puncture area. ?How is this diagnosed? ?Your health care provider will diagnose a foreign body in the hand or foot based on your symptoms, history, and examining your wound. If the foreign body is deep, you may also have tests, such as an X-ray, MRI, or ultrasound. ?How is this treated? ?Your health care provider may be able to remove the foreign body while exploring your wound during the diagnosis. If the foreign body is deep or in a wound that is infected, you may need to have a procedure to remove the foreign body. ?If an incision is needed, it may be closed with stitches  (sutures), skin glue, or adhesive strips. A bandage (dressing) will be placed over the incision. ?You may also need to get a tetanus shot or take antibiotic medicines to prevent or treat an infection. ?Follow these instructions at home: ?Medicines ?Take over-the-counter and prescription medicines only as told by your health care provider. ?If you were prescribed an antibiotic medicine, take the antibiotic as told by your health care provider. Do not stop taking the antibiotic even if you start to feel better. ?Wound care ? ?Follow instructions from your health care provider about how to take care of your wound or incision. Make sure you: ?Wash your hands with soap and water at least 20 seconds before and after you change your dressing. If soap and water are not available, use hand sanitizer. ?Change your dressing as told by your health care provider. ?Leave sutures, skin glue, or adhesive strips in place. These skin closures may need to stay in place for 2 weeks or longer. If adhesive strip edges start to loosen and curl up, you may trim the loose edges. Do not remove adhesive strips completely unless your health care provider tells you to do that. ?Check your wound or incision area every day for signs of infection. Check for: ?More redness, swelling, or pain. ?More fluid or blood. ?Warmth. ?Pus or a bad smell. ?General instructions ?Return to your normal activities as told by your health care provider. Ask your health care provider what activities are safe for you. ?Do not  take baths, swim, or use a hot tub until your health care provider approves. Ask your health care provider if you may take showers. You may only be allowed to take sponge baths. ?Keep all follow-up visits. This is important. ?How is this prevented? ?To protect yourself: ?Wear gloves when working with sharp objects or wood. ?Do not walk barefoot in areas where there may be sharp objects. ?Wear thick-soled shoes or boots when walking in areas where  there are sharp objects. ?Contact a health care provider if: ?You have a foreign body that has not come out or been removed. ?You have more redness, swelling, or pain around the wound or incision area. ?You have more fluid or blood coming from the wound or incision area. ?Your wound or incision area feels warm to the touch. ?You have pus or a bad smell coming from the wound or incision area. ?You have a fever. ?You were given a tetanus shot, and you have swelling, severe pain, redness, or bleeding at the injection site. ?Get help right away if: ?Your wound or incision area becomes numb. ?You cannot use your hand or foot. ?Summary ?A foreign body is an object that gets into your body that should not be there. Your hands and feet are common entry points for foreign bodies. ?Your health care provider may be able to remove the foreign body while exploring your wound during the diagnosis. If the foreign body is deep or in a wound that is infected, you may need to have a procedure to remove the foreign body. ?You may also need to get a tetanus shot or take antibiotic medicines to prevent or treat an infection. ?Check your wound or incision area every day for signs of infection. ?This information is not intended to replace advice given to you by your health care provider. Make sure you discuss any questions you have with your health care provider. ?Document Revised: 10/03/2020 Document Reviewed: 10/03/2020 ?Elsevier Patient Education ? [place]. ? ?

## 2021-05-11 NOTE — Progress Notes (Signed)
?  Subjective:  ?Patient ID: Sue [name], female    DOB: [birth year],  MRN: [id number] ? ?Sue [name] presents to clinic today accompanied by her daughter for with chief concern of injury to right foot. States she has a suspected blister on plantar aspect of the right foot. She is unaware of any injury. Patient states aggravating factor(s) is/are none as patient states she is neuropathic.  Patient has attempted no treatments. ? ?Patient was seen in March for her diabetic foot care with Dr. Sherryle Lis. ? ?She is excited to be expecting another great grandchild, a girl, who will be named after her. ? ?PCP is Merrilee Seashore, MD , and last visit was April 14, 2021. ? ?No Known Allergies ? ?Review of Systems: Negative except as noted in the HPI. ? ?Objective:  ?Constitutional Sue [name] is a pleasant 88 y.o. Caucasian female, WD, WN in NAD. AAO x 3.   ?Vascular CFT immediate b/l LE. Palpable DP/PT pulses b/l LE. Digital hair absent b/l. Skin temperature gradient WNL b/l. No pain with calf compression b/l. No edema noted b/l. No cyanosis or clubbing noted b/l LE.  ?Neurologic Normal speech. Oriented to person, place, and time. Protective sensation diminished with 10g monofilament b/l.  ?Dermatologic Pedal skin is warm and supple b/l LE. She has open superficial laceration submet head 1 right foot. No surrounding erythema, no edema, no drainage, no fluctuance, no odor. Exploration reveals small piece of glass superficially. No interdigital macerations noted b/l LE. Toenails recently debrided. No erythema, no edema, no drainage, no fluctuance. Hyperkeratotic lesion(s) submet head 2 right foot.  No erythema, no edema, no drainage, no fluctuance.  ?Orthopedic: Muscle strength 5/5 to all lower extremity muscle groups bilaterally. No pain, crepitus or joint limitation noted with ROM bilateral LE. 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  ? ? ?Xray findings right foot: ?No gas in tissues right  foot. ?Amputation right 2nd distal phalanx. ?Posterior calcaneal spur noted right foot. ?Laterally deviated hallux with medial deviation of 1st metatarsal right foot. ?No evidence of fracture right foot. ?No bone erosion noted 1st metatarsal head. ?Pes planus foot deformity right foot. ?No foreign body evident right foot.  ? ?Assessment/Plan: ?1. Foreign body in right foot, initial encounter   ?-Patient was evaluated and treated. All patient's and/or POA's questions/concerns answered on today's visit. ?-Sterile scalpel blade used to remove superficial piece of glass en toto. Foot cleansed with alcohol. Triple antibiotic ointment and band-aid applied. Daughter instructed to apply Neosporin to area once daily until healed. ?-Callus submet head 2 debrided as a courtesy on today. ?-Xray of right foot was performed and reviewed with patient and/or POA. ?-Rx for Doxycyline 100 mg, #14, to be taken one capsule twice daily for 7 days. ?-Patient instructed to check tetanus status with PCP. ?-Follow up in 3 weeks for foot check.  ?-Patient/POA to call should there be question/concern in the interim.  ? ?Return in about 3 weeks (around 06/01/2021). ? ?Marzetta Board, DPM  ? ?

## 2021-06-01 ENCOUNTER — Other Ambulatory Visit: Payer: Medicare Other

## 2021-06-01 ENCOUNTER — Ambulatory Visit: Payer: Medicare Other | Admitting: Podiatry

## 2021-06-01 ENCOUNTER — Telehealth (INDEPENDENT_AMBULATORY_CARE_PROVIDER_SITE_OTHER): Payer: Medicare Other | Admitting: Podiatry

## 2021-06-01 DIAGNOSIS — S90851A Superficial foreign body, right foot, initial encounter: Secondary | ICD-10-CM

## 2021-06-01 DIAGNOSIS — E1142 Type 2 diabetes mellitus with diabetic polyneuropathy: Secondary | ICD-10-CM

## 2021-06-01 NOTE — Telephone Encounter (Signed)
Patient was scheduled for follow up foreign body right foot. Spoke to daughter, Jan, and patient was not feeling well this morning. Jan states her foot is fine and they have rescheduled appt to pick up her diabetic shoes next Wednesday. ?

## 2021-06-03 ENCOUNTER — Ambulatory Visit (INDEPENDENT_AMBULATORY_CARE_PROVIDER_SITE_OTHER): Payer: Medicare Other

## 2021-06-03 DIAGNOSIS — M2041 Other hammer toe(s) (acquired), right foot: Secondary | ICD-10-CM | POA: Diagnosis not present

## 2021-06-03 DIAGNOSIS — E1142 Type 2 diabetes mellitus with diabetic polyneuropathy: Secondary | ICD-10-CM | POA: Diagnosis not present

## 2021-06-03 DIAGNOSIS — M2042 Other hammer toe(s) (acquired), left foot: Secondary | ICD-10-CM | POA: Diagnosis not present

## 2021-06-03 DIAGNOSIS — M2141 Flat foot [pes planus] (acquired), right foot: Secondary | ICD-10-CM

## 2021-06-03 NOTE — Progress Notes (Signed)
SITUATION ?Reason for Visit: Fitting of Diabetic [place] ?Patient / Caregiver Report:  Patient is satisfied with fit and function of shoes and insoles. ? ?OBJECTIVE DATA: ?Patient History / Diagnosis:   ?  ICD-10-CM   ?1. Diabetic peripheral neuropathy associated with type 2 diabetes mellitus ([place])  E11.42   ?  ?2. Acquired hammertoes of both feet  M20.41   ? M20.42   ?  ?3. Pes planus of both feet  M21.41   ? M21.42   ?  ? ? ?Change in Status:   None ? ?ACTIONS PERFORMED: ?In-Person Delivery, patient was fit with: ?- 1x pair [id number] PDAC approved prefabricated Diabetic Shoes: Karl Ito 846 9W ?- 3x pair [phone] PDAC approved vacuum formed custom diabetic insoles; [employer]: [id number] ? ?Shoes and insoles were verified for structural integrity and safety. Patient wore shoes and insoles in office. Skin was inspected and free of areas of concern after wearing shoes and inserts. Shoes and inserts fit properly. Patient / Caregiver provided with ferbal instruction and demonstration regarding donning, doffing, wear, care, proper fit, function, purpose, cleaning, and use of shoes and insoles ' and in all related precautions and risks and benefits regarding shoes and insoles. Patient / Caregiver was instructed to wear properly fitting socks with shoes at all times. Patient was also provided with verbal instruction regarding how to report any failures or malfunctions of shoes or inserts, and necessary follow up care. Patient / Caregiver was also instructed to contact physician regarding change in status that may affect function of shoes and inserts.  ? ?Patient / Caregiver verbalized undersatnding of instruction provided. Patient / Caregiver demonstrated independence with proper donning and doffing of shoes and inserts. ? ?PLAN ?Patient to follow with treating physician as recommended. Plan of care was discussed with and agreed upon by patient and/or caregiver. All questions were answered and concerns  addressed. ? ?

## 2021-06-11 NOTE — Progress Notes (Signed)
[facility] Note  06/23/2021   CHIEF COMPLAINT Patient presents for Retina Follow Up  HISTORY OF PRESENT ILLNESS: Sue [name] is a 88 y.o. female who presents to the clinic today for:  HPI     Retina Follow Up   Patient presents with  Wet AMD.  In left eye.  Severity is moderate.  Duration of 8 weeks.  Since onset it is stable.  I, the attending physician,  performed the HPI with the patient and updated documentation appropriately.        Comments   Pt here for ret f/u exu ARMD OS. Pt states [facility] the same. Pt scheduled for eye exam in August for new glasses.       Last edited by Bernarda Caffey, MD on 06/23/2021  4:05 PM.     Referring physician: Merrilee Seashore, [address],  [place] [address]  HISTORICAL INFORMATION:   Selected notes from the MEDICAL RECORD NUMBER Referred by Dr. Quentin Ore for concern of ARMD OU   CURRENT MEDICATIONS: Current Outpatient Medications (Ophthalmic Drugs)  Medication Sig   Polyethyl Glycol-Propyl Glycol (SYSTANE) 0.4-0.3 % SOLN Place 1 drop into both eyes 3 (three) times daily as needed (dry eyes).   RESTASIS 0.05 % ophthalmic emulsion Place 1 drop into both eyes 2 (two) times daily.   No current facility-administered medications for this visit. (Ophthalmic Drugs)   Current Outpatient Medications (Other)  Medication Sig   amLODipine (NORVASC) 10 MG tablet Take 10 mg by mouth every morning.    B-D ULTRAFINE III SHORT PEN 31G X 8 MM MISC USE AS DIRECTED ONCE DAILY SUBCUTANEOUSLY   Calcium Citrate-Vitamin D (CALCIUM + D PO)    calcium-vitamin D (OSCAL WITH D) 500-200 MG-UNIT tablet Take 1 tablet by mouth 2 (two) times daily.    cephALEXin (KEFLEX) 500 MG capsule Take 1 capsule (500 mg total) by mouth 2 (two) times daily.   Continuous Blood Gluc Sensor (FREESTYLE LIBRE 2 SENSOR) MISC See admin instructions.   Cyanocobalamin (B-12 PO)    diphenhydramine-acetaminophen (TYLENOL PM)  25-500 MG TABS tablet Take 1 tablet by mouth at bedtime as needed (sleep).   Glucagon (GVOKE HYPOPEN 2-PACK) 0.5 MG/0.1ML SOAJ as needed for hypoglycemia   glucose blood test strip OneTouch Ultra Test strips  TEST ONCE A DAY ONCE A DAY FINGERSTICK 90   hydrALAZINE (APRESOLINE) 25 MG tablet Take 3 tablets (75 mg total) by mouth every 8 (eight) hours.   hydrochlorothiazide (HYDRODIURIL) 25 MG tablet Take 25 mg by mouth daily.   ibuprofen (ADVIL) 200 MG tablet Take 400 mg by mouth every 6 (six) hours as needed for moderate pain.   Insulin Degludec (TRESIBA FLEXTOUCH) 200 UNIT/ML SOPN Inject 36 Units into the skin at bedtime. (Patient taking differently: Inject 22 Units into the skin at bedtime.)   Insulin Pen Needle (PEN NEEDLES) 32G X 4 MM MISC BD Ultra-Fine Nano Pen Needle 32 gauge x 5/32"  USE AS DIRECTED DAILY   JANUVIA 25 MG tablet Take 25 mg by mouth daily.   ketoconazole (NIZORAL) 2 % cream Apply topically.   Lancets (ONETOUCH ULTRASOFT) lancets OneTouch UltraSoft Lancets  USE TWICE DAILY   Melatonin 10 MG CAPS Take 10 mg by mouth at bedtime as needed (sleep).   meloxicam (MOBIC) 7.5 MG tablet Take 1 tablet (7.5 mg total) by mouth daily as needed for pain.   Multiple Vitamins-Minerals (ADULT ONE DAILY GUMMIES) CHEW Chew 1 tablet by  mouth 2 (two) times daily. Centrum   Multiple Vitamins-Minerals (AIRBORNE) TBEF Take 1 tablet by mouth daily as needed (immune support).    Multiple Vitamins-Minerals (CENTRUM SILVER PO)    Multiple Vitamins-Minerals (PRESERVISION AREDS 2) CAPS Take 1 capsule by mouth 2 (two) times daily.   mupirocin ointment (BACTROBAN) 2 % Apply to right foot ulcer once daily. (Patient taking differently: Apply 1 application. topically daily as needed (wound care). Apply to right foot ulcer once daily.)   nortriptyline (PAMELOR) 25 MG capsule Take 1 capsule (25 mg total) by mouth at bedtime. (Patient taking differently: Take 25 mg by mouth 2 (two) times daily.)   Omega-3 Fatty  Acids (FISH OIL) 1200 MG CAPS Take 1,200 mg by mouth daily with lunch.    omeprazole (PRILOSEC) 20 MG capsule Take 20 mg by mouth every morning.   ondansetron (ZOFRAN) 4 MG tablet Take 4 mg by mouth 3 (three) times daily as needed.   ONE TOUCH ULTRA TEST test strip    PRESCRIPTION MEDICATION by Intravitreal route every 30 (thirty) days. Ophthalmic injection at MD office in left eye   silver sulfADIAZINE (SILVADENE) 1 % cream Apply pea-sized amount to wound daily. (Patient taking differently: Apply 1 application. topically daily. Apply pea-sized amount to wound daily.)   simvastatin (ZOCOR) 20 MG tablet Take 20 mg by mouth at bedtime.    traMADol (ULTRAM) 50 MG tablet Take 1 tablet (50 mg total) by mouth 2 (two) times daily as needed.   vitamin B-12 (CYANOCOBALAMIN) 1000 MCG tablet Take 1,000 mcg by mouth 2 (two) times daily.    Current Facility-Administered Medications (Other)  Medication Route   Bevacizumab (AVASTIN) SOLN 1.25 mg Intravitreal   Bevacizumab (AVASTIN) SOLN 1.25 mg Intravitreal   REVIEW OF SYSTEMS: ROS   Positive for: Neurological, Skin, Genitourinary, Musculoskeletal, Endocrine, Eyes Negative for: Constitutional, Gastrointestinal, HENT, Cardiovascular, Respiratory, Psychiatric, Allergic/Imm, Heme/Lymph Last edited by Kingsley Spittle, COT on 06/23/2021  1:45 PM.     ALLERGIES No Known Allergies  PAST MEDICAL HISTORY Past Medical History:  Diagnosis Date   Anemia    Arthritis    Basal cell carcinoma 11/23/2017   left elbow, right neck TX CX3 5FU    Basal cell carcinoma 04/04/2019   infil-right sideburn (CX35FU)   Chronic kidney disease    stage 3 ckd stage 3 b per dr Ashby Dawes 09-26-2019 note on chart   Complication of anesthesia    deifficulty waking up   COVID-19    Diabetic neuropathy ([place])    Hyperlipidemia    Hypertension    Hypertensive retinopathy    OU   Infiltrative basal cell carcinoma (BCC) 04/22/2020   Right Zygomatic Area    Macular  degeneration    OU   Nodular basal cell carcinoma (BCC) 04/22/2020   Right Anterior Neck   Pneumonia    Right carotid bruit per dr Ashby Dawes 09-26-2019 note on chart   SCC (squamous cell carcinoma) 11/23/2017   right forehead TX CX3 5FU   SCCA (squamous cell carcinoma) of skin 04/22/2020   Mid Parietal Scalp (in situ)   SCCA (squamous cell carcinoma) of skin 04/22/2020   Right Dorsal Hand (in situ)   Type 2 DM with CKD stage 3 and hypertension ([place])    Past Surgical History:  Procedure Laterality Date   ABDOMINAL HYSTERECTOMY     AMPUTATION TOE Right 09/28/2019   Procedure: Right 2nd toe amputation - partial;  Surgeon: Evelina Bucy, DPM;  Location: [facility];  Service: Podiatry;  Laterality: Right;   CATARACT EXTRACTION     CHOLECYSTECTOMY     EYE SURGERY     HERNIA REPAIR     SPINE SURGERY     TONSILLECTOMY     FAMILY HISTORY History reviewed. No pertinent family history.  SOCIAL HISTORY Social History   Tobacco Use   Smoking status: Former   Smokeless tobacco: Never  Scientific laboratory technician Use: Never used  Substance Use Topics   Alcohol use: No    Alcohol/week: 0.0 standard drinks   Drug use: No       OPHTHALMIC EXAM:  Base Eye Exam     Visual Acuity (Snellen - Linear)       Right Left   Dist cc 20/40 20/40 +1   Dist ph cc NI 20/30 -2    Correction: Glasses         Tonometry (Tonopen, 1:54 PM)       Right Left   Pressure 11 8         Pupils       Dark Light Shape React APD   Right 4 3 Roundq Brisk None   Left 4 3 Roundq Brisk None         Visual Fields (Counting fingers)       Left Right    Full Full         Extraocular Movement       Right Left    Full, Ortho Full, Ortho         Neuro/Psych     Oriented x3: Yes   Mood/Affect: Normal         Dilation     Both eyes: 1.0% Mydriacyl, 2.5% Phenylephrine @ 1:55 PM           Slit Lamp and Fundus Exam     Slit Lamp Exam       Right Left    Lids/Lashes Dermatochalasis - upper lid, Telangiectasia, mild MGD Dermatochalasis - upper lid, Telangiectasia, Meibomian gland dysfunction   Conjunctiva/Sclera White and quiet White and quiet   Cornea Temporal Well healed cataract wounds, mild Arcus, trace endo pigment nasally, 1+PEE Arcus, Temporal Well healed cataract wounds, 1-2+ PEE   Anterior Chamber Deep and quiet Deep and quiet   Iris Round and moderately dilated to 4.8m Round and moderately dilated to 520m Iris atrophy from 0100 to 0230, peripheral irodotimy at 0200   Lens 3-piece PC IOL in good position PC IOL in good position   Anterior Vitreous Vitreous syneresis, Posterior vitreous detachment, Weiss ring, vitreous condensations Vitreous syneresis, PVD, vitreous condensations         Fundus Exam       Right Left   Disc 360 Peripapillary atrophy, diffuse mild pallor, compact, sharp rim mild pallor, Peripapillary atrophy and pigmentation   C/D Ratio 0.1 0.2   Macula Blunted foveal reflex, +PED, RPE mottling, clumping and early atrophy, Drusen, No heme or edema Blunted foveal reflex, low lying PED / +CNVM with stable improvement in IRF, interval improvement in SRF, RPE mottling and clumping, Drusen, No heme   Vessels Vascular attenuation Vascular attenuation   Periphery Attached, 360 Reticular degeneration Attached, 360 Reticular degeneration           Refraction     Wearing Rx       Sphere Cylinder Axis   Right -1.50 +0.75 180   Left -0.50 +0.75 180           IMAGING AND  PROCEDURES  Imaging and Procedures for '@TODAY' @  OCT, Retina - OU - Both Eyes       Right Eye Quality was good. Central Foveal Thickness: 237. Progression has been stable. Findings include normal foveal contour, no IRF, no SRF, pigment epithelial detachment, outer retinal atrophy, retinal drusen (persistent PED -- centrally).   Left Eye Quality was good. Central Foveal Thickness: 200. Progression has improved. Findings include no IRF, retinal  drusen , pigment epithelial detachment, outer retinal atrophy, epiretinal membrane, abnormal foveal contour, subretinal fluid (Interval improvement in SRF overlying stable, low PED, partial PVD).   Notes *Images captured and stored on drive  Diagnosis / Impression:  OD: Non-Exudative ARMD -- stable OS: Exudative ARMD -- Interval improvement in SRF overlying stable, low PED; partial PVD  Clinical management:  See below  Abbreviations: NFP - Normal foveal profile. CME - cystoid macular edema. PED - pigment epithelial detachment. IRF - intraretinal fluid. SRF - subretinal fluid. EZ - ellipsoid zone. ERM - epiretinal membrane. ORA - outer retinal atrophy. ORT - outer retinal tubulation. SRHM - subretinal hyper-reflective material       Intravitreal Injection, Pharmacologic Agent - OS - Left Eye       Time Out 06/23/2021. 2:39 PM. Confirmed correct patient, procedure, site, and patient consented.   Anesthesia Topical anesthesia was used. Anesthetic medications included Lidocaine 2%, Proparacaine 0.5%.   Procedure Preparation included 5% betadine to ocular surface, eyelid speculum. A supplied (32g) needle was used.   Injection: 1.25 mg Bevacizumab 1.70m/0.05ml   Route: Intravitreal, Site: Left Eye   NDC:: 34917-915-05 Lot: [id number]'@[id number]' , Expiration date: 08/11/2021   Post-op Post injection exam found visual acuity of at least counting fingers. The patient tolerated the procedure well. There were no complications. The patient received written and verbal post procedure care education. Post injection medications were not given.            ASSESSMENT/PLAN:    ICD-10-CM   1. Exudative age-related macular degeneration of left eye with active choroidal neovascularization (HCC)  H35.3221 OCT, Retina - OU - Both Eyes    Intravitreal Injection, Pharmacologic Agent - OS - Left Eye    Bevacizumab (AVASTIN) SOLN 1.25 mg    2. Intermediate stage nonexudative age-related macular degeneration  of right eye  H35.3112 OCT, Retina - OU - Both Eyes    3. Diabetes mellitus type 2 without retinopathy ([place]  E11.9     4. Essential hypertension  I10     5. Hypertensive retinopathy of both eyes  H35.033     6. Pseudophakia of both eyes  Z96.1      1. Exudative age-related macular degeneration, left eye  - conversion of OS from nonexudative to exudative ARMD prior to 02.05.20 visit  - s/p IVA OS #1 (02.05.20), #2 (03.04.20), #3 (04.30.20), #4 (06.02.20), #5 (07.14.20), #6 (9.1.20), #7 (10.27.20), #8 (12.23.20), #9 (02.03.21), #10 (03.17.21), #11 (04.28.21), #12 (06.09.21), #13 (08.04.21), #14 (09.29.21), #15 (11.22.2021), #16 (01.26.22), #17 (04.06.22), #18 (06.28.22), #19 (9.13.22), #20 (11.29.22), #21 (01.31.23), #22 (04.04.23)  **history of recurrent / worsening SRF at 9 wk interval, noted on 04.04.23 visit**  - OCT shows interval improvement in SRF overlying stable, low PED, partial PVD at 8 wks   - BCVA stable at 20/40   - recommend IVA OS #23 today, 05.30.23 w/ f/u in 8 wks  - pt wishes to be treated with IVA OS  - risks and benefits of intravitreal avastin discussed with patient  - informed consent obtained  -  see procedure note  - Avastin informed consent obtained, re-signed and scanned 05.30.23  - f/u in 8 wks for DFE, OCT, likely injection OS   2. Age related macular degeneration, non-exudative, right eye  - stable, former pt of Dr. Zigmund Daniel -- no significant change in OCT or exam  - The incidence, anatomy, and pathology of dry AMD, risk of progression, and the AREDS and AREDS 2 study including smoking risks discussed with patient.  - recommend amsler grid monitoring  3. Diabetes mellitus, type 2 without retinopathy  - The incidence, risk factors for progression, natural history and treatment options for diabetic retinopathy  were discussed with patient.    - The need for close monitoring of blood glucose, blood pressure, and serum lipids, avoiding cigarette or any type of  tobacco, and the need for long term follow up was also discussed with patient.  - f/u in 1 year  4,5. Hypertensive retinopathy OU  - discussed importance of tight BP control  - monitor  6. Pseudophakia OU  - s/p CE/IOL OU  - beautiful surgeries, doing well  - monitor  Ophthalmic Meds Ordered this visit:  Meds ordered this encounter  Medications   Bevacizumab (AVASTIN) SOLN 1.25 mg    Return in about 8 weeks (around 08/18/2021) for f/u exu ARMD OS, DFE, OCT.  There are no Patient Instructions on file for this visit.   This document serves as a record of services personally performed by Gardiner Sleeper, MD, PhD. It was created on their behalf by Bernarda Caffey, MD, an ophthalmic technician. The creation of this record is the provider's dictation and/or activities during the visit.    Electronically signed by: Bernarda Caffey, MD 06/11/21  4:10 PM    Gardiner Sleeper, M.D., Ph.D. Diseases & Surgery of the Retina and Vitreous [facility]  I have reviewed the above documentation for accuracy and completeness, and I agree with the above. Gardiner Sleeper, M.D., Ph.D. 06/23/21 4:10 PM   Abbreviations: M myopia (nearsighted); A astigmatism; H hyperopia (farsighted); P presbyopia; Mrx spectacle prescription;  CTL contact lenses; OD right eye; OS left eye; OU both eyes  XT exotropia; ET esotropia; PEK punctate epithelial keratitis; PEE punctate epithelial erosions; DES dry eye syndrome; MGD meibomian gland dysfunction; ATs artificial tears; PFAT's preservative free artificial tears; [place] nuclear sclerotic cataract; PSC posterior subcapsular cataract; ERM epi-retinal membrane; PVD posterior vitreous detachment; RD retinal detachment; DM diabetes mellitus; DR diabetic retinopathy; NPDR non-proliferative diabetic retinopathy; PDR proliferative diabetic retinopathy; CSME clinically significant macular edema; DME diabetic macular edema; dbh dot blot hemorrhages; CWS cotton wool  spot; POAG primary open angle glaucoma; C/D cup-to-disc ratio; HVF humphrey visual field; GVF goldmann visual field; OCT optical coherence tomography; IOP intraocular pressure; BRVO Branch retinal vein occlusion; CRVO central retinal vein occlusion; CRAO central retinal artery occlusion; BRAO branch retinal artery occlusion; RT retinal tear; SB scleral buckle; PPV pars plana vitrectomy; VH Vitreous hemorrhage; PRP panretinal laser photocoagulation; IVK intravitreal kenalog; VMT vitreomacular traction; MH Macular hole;  NVD neovascularization of the disc; NVE neovascularization elsewhere; AREDS age related eye disease study; ARMD age related macular degeneration; POAG primary open angle glaucoma; EBMD epithelial/anterior basement membrane dystrophy; ACIOL anterior chamber intraocular lens; IOL intraocular lens; PCIOL posterior chamber intraocular lens; Phaco/IOL phacoemulsification with intraocular lens placement; [place] photorefractive keratectomy; LASIK laser assisted in situ keratomileusis; HTN hypertension; DM diabetes mellitus; COPD chronic obstructive pulmonary disease

## 2021-06-23 ENCOUNTER — Ambulatory Visit (INDEPENDENT_AMBULATORY_CARE_PROVIDER_SITE_OTHER): Payer: Medicare Other | Admitting: Ophthalmology

## 2021-06-23 ENCOUNTER — Encounter (INDEPENDENT_AMBULATORY_CARE_PROVIDER_SITE_OTHER): Payer: Self-pay | Admitting: Ophthalmology

## 2021-06-23 DIAGNOSIS — H353221 Exudative age-related macular degeneration, left eye, with active choroidal neovascularization: Secondary | ICD-10-CM

## 2021-06-23 DIAGNOSIS — H35033 Hypertensive retinopathy, bilateral: Secondary | ICD-10-CM | POA: Diagnosis not present

## 2021-06-23 DIAGNOSIS — I10 Essential (primary) hypertension: Secondary | ICD-10-CM | POA: Diagnosis not present

## 2021-06-23 DIAGNOSIS — E119 Type 2 diabetes mellitus without complications: Secondary | ICD-10-CM

## 2021-06-23 DIAGNOSIS — Z961 Presence of intraocular lens: Secondary | ICD-10-CM | POA: Diagnosis not present

## 2021-06-23 DIAGNOSIS — H353112 Nonexudative age-related macular degeneration, right eye, intermediate dry stage: Secondary | ICD-10-CM | POA: Diagnosis not present

## 2021-06-23 MED ORDER — BEVACIZUMAB CHEMO INJECTION 1.25MG/0.05ML SYRINGE FOR KALEIDOSCOPE
1.2500 mg | INTRAVITREAL | Status: AC | PRN
  Administered 2021-06-23: 1.25 mg via INTRAVITREAL

## 2021-07-14 DIAGNOSIS — N1832 Chronic kidney disease, stage 3b: Secondary | ICD-10-CM | POA: Diagnosis not present

## 2021-07-14 DIAGNOSIS — I129 Hypertensive chronic kidney disease with stage 1 through stage 4 chronic kidney disease, or unspecified chronic kidney disease: Secondary | ICD-10-CM | POA: Diagnosis not present

## 2021-07-14 DIAGNOSIS — E1142 Type 2 diabetes mellitus with diabetic polyneuropathy: Secondary | ICD-10-CM | POA: Diagnosis not present

## 2021-07-14 DIAGNOSIS — L84 Corns and callosities: Secondary | ICD-10-CM | POA: Diagnosis not present

## 2021-07-14 DIAGNOSIS — E782 Mixed hyperlipidemia: Secondary | ICD-10-CM | POA: Diagnosis not present

## 2021-07-17 ENCOUNTER — Other Ambulatory Visit: Payer: Self-pay | Admitting: *Deleted

## 2021-07-17 ENCOUNTER — Encounter: Payer: Self-pay | Admitting: Podiatry

## 2021-07-17 ENCOUNTER — Ambulatory Visit: Payer: Medicare Other | Admitting: Podiatry

## 2021-07-17 DIAGNOSIS — B351 Tinea unguium: Secondary | ICD-10-CM | POA: Diagnosis not present

## 2021-07-17 DIAGNOSIS — M79675 Pain in left toe(s): Secondary | ICD-10-CM | POA: Diagnosis not present

## 2021-07-17 DIAGNOSIS — E1142 Type 2 diabetes mellitus with diabetic polyneuropathy: Secondary | ICD-10-CM | POA: Diagnosis not present

## 2021-07-17 DIAGNOSIS — M79674 Pain in right toe(s): Secondary | ICD-10-CM | POA: Diagnosis not present

## 2021-07-21 DIAGNOSIS — N1832 Chronic kidney disease, stage 3b: Secondary | ICD-10-CM | POA: Diagnosis not present

## 2021-07-21 DIAGNOSIS — E782 Mixed hyperlipidemia: Secondary | ICD-10-CM | POA: Diagnosis not present

## 2021-07-21 DIAGNOSIS — I129 Hypertensive chronic kidney disease with stage 1 through stage 4 chronic kidney disease, or unspecified chronic kidney disease: Secondary | ICD-10-CM | POA: Diagnosis not present

## 2021-07-21 DIAGNOSIS — E1142 Type 2 diabetes mellitus with diabetic polyneuropathy: Secondary | ICD-10-CM | POA: Diagnosis not present

## 2021-07-23 ENCOUNTER — Other Ambulatory Visit: Payer: Self-pay | Admitting: Gastroenterology

## 2021-07-23 DIAGNOSIS — R198 Other specified symptoms and signs involving the digestive system and abdomen: Secondary | ICD-10-CM

## 2021-07-23 DIAGNOSIS — R1032 Left lower quadrant pain: Secondary | ICD-10-CM | POA: Diagnosis not present

## 2021-07-23 DIAGNOSIS — K5900 Constipation, unspecified: Secondary | ICD-10-CM | POA: Diagnosis not present

## 2021-07-27 NOTE — Progress Notes (Signed)
  Subjective:  Patient ID: Sue [name], female    DOB: [birth year],  MRN: [id number]  Sue [name] presents to clinic today for at risk foot care with history of diabetic neuropathy and painful thick toenails that are difficult to trim. Pain interferes with ambulation. Aggravating factors include wearing enclosed shoe gear. Pain is relieved with periodic professional debridement.  Patient states blood glucose was 111 mg/dl today.  Last known HgA1c was unknown.    New problem(s): None.   PCP is Merrilee Seashore, MD , and last visit was July 14, 2021.  No Known Allergies  Review of Systems: Negative except as noted in the HPI.  Objective:  Constitutional Sue [name] is a pleasant 88 y.o. Caucasian female, WD, WN in NAD. AAO x 3.   Vascular CFT immediate b/l LE. Palpable DP/PT pulses b/l LE. Digital hair absent b/l. Skin temperature gradient WNL b/l. No pain with calf compression b/l. No edema noted b/l. No cyanosis or clubbing noted b/l LE.  Neurologic Normal speech. Oriented to person, place, and time. Protective sensation diminished with 10g monofilament b/l.  Dermatologic Pedal skin is warm and supple b/l LE. Pedal skin is warm and supple b/l LE. No open wounds b/l LE. No interdigital macerations noted b/l LE. Toenails 1-5 b/l elongated, discolored, dystrophic, thickened, crumbly with subungual debris and tenderness to dorsal palpation. No hyperkeratotic nor porokeratotic lesions present on today's visit.  Orthopedic: Muscle strength 5/5 to all lower extremity muscle groups bilaterally. No pain, crepitus or joint limitation noted with ROM bilateral LE. 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   Assessment/Plan: 1. Pain due to onychomycosis of toenails of both feet   2. Diabetic peripheral neuropathy associated with type 2 diabetes mellitus ([place])     -Examined patient. -Continue diabetic shoes daily. -Toenails 1-5 left foot, 3-5 right foot, and R hallux  debrided in length and girth without iatrogenic bleeding with sterile nail nipper and dremel.  -Patient/POA to call should there be question/concern in the interim.   Return in about 9 weeks (around 09/18/2021).  Marzetta Board, DPM

## 2021-08-04 ENCOUNTER — Ambulatory Visit: Payer: Medicare Other | Admitting: Dermatology

## 2021-08-12 NOTE — Progress Notes (Addendum)
[facility] Note  08/20/2021   CHIEF COMPLAINT Patient presents for Retina Follow Up  HISTORY OF PRESENT ILLNESS: Sue [name] is a 88 y.o. female who presents to the clinic today for:  HPI     Retina Follow Up   Patient presents with  Wet AMD (IVA OS 23 (05.30.23)).  In left eye.  Severity is moderate.  Duration of 8 weeks.  Since onset it is stable.  I, the attending physician,  performed the HPI with the patient and updated documentation appropriately.        Comments   Patient feels that the vision is the same. She gets new glasses August 11,2023.      Last edited by Bernarda Caffey, MD on 08/20/2021  9:26 PM.     Referring physician: Merrilee Seashore, [address] [place],  [place] [address]  HISTORICAL INFORMATION:   Selected notes from the MEDICAL RECORD NUMBER Referred by Dr. Quentin Ore for concern of ARMD OU   CURRENT MEDICATIONS: Current Outpatient Medications (Ophthalmic Drugs)  Medication Sig   Polyethyl Glycol-Propyl Glycol (SYSTANE) 0.4-0.3 % SOLN Place 1 drop into both eyes 3 (three) times daily as needed (dry eyes).   RESTASIS 0.05 % ophthalmic emulsion Place 1 drop into both eyes 2 (two) times daily.   No current facility-administered medications for this visit. (Ophthalmic Drugs)   Current Outpatient Medications (Other)  Medication Sig   amLODipine (NORVASC) 10 MG tablet Take 1 tablet by mouth daily.   B-D ULTRAFINE III SHORT PEN 31G X 8 MM MISC USE AS DIRECTED ONCE DAILY SUBCUTANEOUSLY   Calcium Citrate-Vitamin D (CALCIUM + D PO)    calcium-vitamin D (OSCAL WITH D) 500-200 MG-UNIT tablet Take 1 tablet by mouth 2 (two) times daily.    Continuous Blood Gluc Sensor (FREESTYLE LIBRE 2 SENSOR) MISC See admin instructions.   Cyanocobalamin (B-12 PO)    diphenhydramine-acetaminophen (TYLENOL PM) 25-500 MG TABS tablet Take 1 tablet by mouth at bedtime as needed (sleep).   Glucagon (GVOKE HYPOPEN 2-PACK)  0.5 MG/0.1ML SOAJ as needed for hypoglycemia   glucose blood test strip OneTouch Ultra Test strips  TEST ONCE A DAY ONCE A DAY FINGERSTICK 90   hydrALAZINE (APRESOLINE) 25 MG tablet Take 3 tablets (75 mg total) by mouth every 8 (eight) hours.   hydrochlorothiazide (HYDRODIURIL) 25 MG tablet Take 25 mg by mouth daily.   ibuprofen (ADVIL) 200 MG tablet Take 400 mg by mouth every 6 (six) hours as needed for moderate pain.   Insulin Degludec (TRESIBA FLEXTOUCH) 200 UNIT/ML SOPN Inject 36 Units into the skin at bedtime. (Patient taking differently: Inject 22 Units into the skin at bedtime.)   Insulin Pen Needle (PEN NEEDLES) 32G X 4 MM MISC BD Ultra-Fine Nano Pen Needle 32 gauge x 5/32"  USE AS DIRECTED DAILY   ketoconazole (NIZORAL) 2 % cream Apply topically.   Lancets (ONETOUCH ULTRASOFT) lancets OneTouch UltraSoft Lancets  USE TWICE DAILY   Melatonin 10 MG CAPS Take 10 mg by mouth at bedtime as needed (sleep).   meloxicam (MOBIC) 7.5 MG tablet Take 1 tablet (7.5 mg total) by mouth daily as needed for pain.   Multiple Vitamins-Minerals (ADULT ONE DAILY GUMMIES) CHEW Chew 1 tablet by mouth 2 (two) times daily. Centrum   Multiple Vitamins-Minerals (AIRBORNE) TBEF Take 1 tablet by mouth daily as needed (immune support).  (Patient not taking: Reported on 08/18/2021)   Multiple Vitamins-Minerals (CENTRUM SILVER PO)  Multiple Vitamins-Minerals (PRESERVISION AREDS 2) CAPS Take 1 capsule by mouth 2 (two) times daily. (Patient not taking: Reported on 08/18/2021)   mupirocin ointment (BACTROBAN) 2 % Apply to right foot ulcer once daily. (Patient taking differently: Apply 1 application  topically daily as needed (wound care). Apply to right foot ulcer once daily.)   nortriptyline (PAMELOR) 25 MG capsule Take 1 capsule (25 mg total) by mouth at bedtime. (Patient taking differently: Take 25 mg by mouth 2 (two) times daily.)   Omega-3 Fatty Acids (FISH OIL) 1200 MG CAPS Take 1,200 mg by mouth daily with lunch.     omeprazole (PRILOSEC) 20 MG capsule Take 1 capsule by mouth daily.   ondansetron (ZOFRAN) 4 MG tablet Take 4 mg by mouth 3 (three) times daily as needed.   ONE TOUCH ULTRA TEST test strip    PRESCRIPTION MEDICATION by Intravitreal route every 30 (thirty) days. Ophthalmic injection at MD office in left eye   silver sulfADIAZINE (SILVADENE) 1 % cream Apply pea-sized amount to wound daily. (Patient taking differently: Apply 1 application  topically daily. Apply pea-sized amount to wound daily.)   simvastatin (ZOCOR) 20 MG tablet Take 1 tablet by mouth daily.   traMADol (ULTRAM) 50 MG tablet Take 1 tablet (50 mg total) by mouth 2 (two) times daily as needed.   vitamin B-12 (CYANOCOBALAMIN) 1000 MCG tablet Take 1,000 mcg by mouth 2 (two) times daily.    Current Facility-Administered Medications (Other)  Medication Route   Bevacizumab (AVASTIN) SOLN 1.25 mg Intravitreal   Bevacizumab (AVASTIN) SOLN 1.25 mg Intravitreal   REVIEW OF SYSTEMS: ROS   Positive for: Neurological, Skin, Genitourinary, Musculoskeletal, Endocrine, Eyes Negative for: Constitutional, Gastrointestinal, HENT, Cardiovascular, Respiratory, Psychiatric, Allergic/Imm, Heme/Lymph Last edited by Annie Paras, COT on 08/20/2021  2:25 PM.     ALLERGIES No Known Allergies  PAST MEDICAL HISTORY Past Medical History:  Diagnosis Date   Anemia    Arthritis    Basal cell carcinoma 11/23/2017   left elbow, right neck TX CX3 5FU    Basal cell carcinoma 04/04/2019   infil-right sideburn (CX35FU)   Chronic kidney disease    stage 3 ckd stage 3 b per dr Ashby Dawes 09-26-2019 note on chart   Complication of anesthesia    deifficulty waking up   COVID-19    Diabetic neuropathy ([place])    Hyperlipidemia    Hypertension    Hypertensive retinopathy    OU   Infiltrative basal cell carcinoma (BCC) 04/22/2020   Right Zygomatic Area    Macular degeneration    OU   Nodular basal cell carcinoma (BCC) 04/22/2020   Right  Anterior Neck   Pneumonia    Right carotid bruit per dr Ashby Dawes 09-26-2019 note on chart   SCC (squamous cell carcinoma) 11/23/2017   right forehead TX CX3 5FU   SCCA (squamous cell carcinoma) of skin 04/22/2020   Mid Parietal Scalp (in situ)   SCCA (squamous cell carcinoma) of skin 04/22/2020   Right Dorsal Hand (in situ)   Type 2 DM with CKD stage 3 and hypertension ([place])    Past Surgical History:  Procedure Laterality Date   ABDOMINAL HYSTERECTOMY     AMPUTATION TOE Right 09/28/2019   Procedure: Right 2nd toe amputation - partial;  Surgeon: Evelina Bucy, DPM;  Location: [facility];  Service: Podiatry;  Laterality: Right;   CATARACT EXTRACTION     CHOLECYSTECTOMY     EYE SURGERY     HERNIA REPAIR  SPINE SURGERY     TONSILLECTOMY     FAMILY HISTORY History reviewed. No pertinent family history.  SOCIAL HISTORY Social History   Tobacco Use   Smoking status: Former   Smokeless tobacco: Never  Scientific laboratory technician Use: Never used  Substance Use Topics   Alcohol use: No    Alcohol/week: 0.0 standard drinks of alcohol   Drug use: No       OPHTHALMIC EXAM:  Base Eye Exam     Visual Acuity (Snellen - Linear)       Right Left   Dist cc 20/40 20/40   Dist ph cc NI 20/30    Correction: Glasses         Tonometry (Tonopen, 2:28 PM)       Right Left   Pressure 18 15         Pupils       Dark Light Shape React APD   Right 4 3 Round Brisk None   Left 4 3 Round Brisk None         Visual Fields       Left Right    Full Full         Extraocular Movement       Right Left    Full, Ortho Full, Ortho         Neuro/Psych     Oriented x3: Yes   Mood/Affect: Normal         Dilation     Both eyes: 1.0% Mydriacyl, 2.5% Phenylephrine @ 2:26 PM           Slit Lamp and Fundus Exam     Slit Lamp Exam       Right Left   Lids/Lashes Dermatochalasis - upper lid, Telangiectasia, mild MGD Dermatochalasis - upper lid,  Telangiectasia, Meibomian gland dysfunction   Conjunctiva/Sclera White and quiet White and quiet   Cornea Temporal Well healed cataract wounds, mild Arcus, trace endo pigment nasally, 1+PEE Arcus, Temporal Well healed cataract wounds, 1-2+ PEE   Anterior Chamber Deep and quiet Deep and quiet   Iris Round and moderately dilated to 4.101m Round and moderately dilated to 526m Iris atrophy from 0100 to 0230, peripheral irodotimy at 0200   Lens 3-piece PC IOL in good position PC IOL in good position   Anterior Vitreous Vitreous syneresis, Posterior vitreous detachment, Weiss ring, vitreous condensations Vitreous syneresis, PVD, vitreous condensations         Fundus Exam       Right Left   Disc 360 Peripapillary atrophy, diffuse mild pallor, compact, sharp rim mild pallor, Peripapillary atrophy and pigmentation   C/D Ratio 0.1 0.2   Macula Blunted foveal reflex, +PED, RPE mottling, clumping and early atrophy, Drusen, No heme or edema Blunted foveal reflex, low lying PED / +CNVM with stable improvement in IRF, stable improvement in SRF, RPE mottling and clumping, Drusen, No heme   Vessels Vascular attenuation, Attenuated Vascular attenuation   Periphery Attached, 360 Reticular degeneration Attached, 360 Reticular degeneration           Refraction     Wearing Rx       Sphere Cylinder Axis   Right -1.50 +0.75 180   Left -0.50 +0.75 180           IMAGING AND PROCEDURES  Imaging and Procedures for '@TODAY'$ @  OCT, Retina - OU - Both Eyes       Right Eye Quality was good. Central Foveal Thickness: 236. Progression  has been stable. Findings include normal foveal contour, no IRF, no SRF, retinal drusen , intraretinal hyper-reflective material, pigment epithelial detachment, outer retinal atrophy (Persistent central PED).   Left Eye Quality was good. Central Foveal Thickness: 251. Progression has been stable. Findings include no IRF, abnormal foveal contour, retinal drusen , epiretinal  membrane, pigment epithelial detachment, subretinal fluid, outer retinal atrophy (stable improvement in SRF overlying low PED; partial PVD).   Notes *Images captured and stored on drive  Diagnosis / Impression:  OD: Non-Exudative ARMD -- stable OS: Exudative ARMD -- stable  improvement in SRF overlying, low PED; partial PVD  Clinical management:  See below  Abbreviations: NFP - Normal foveal profile. CME - cystoid macular edema. PED - pigment epithelial detachment. IRF - intraretinal fluid. SRF - subretinal fluid. EZ - ellipsoid zone. ERM - epiretinal membrane. ORA - outer retinal atrophy. ORT - outer retinal tubulation. SRHM - subretinal hyper-reflective material       Intravitreal Injection, Pharmacologic Agent - OS - Left Eye       Time Out 08/20/2021. 3:10 PM. Confirmed correct patient, procedure, site, and patient consented.   Anesthesia Topical anesthesia was used. Anesthetic medications included Lidocaine 2%, Proparacaine 0.5%.   Procedure Preparation included 5% betadine to ocular surface, eyelid speculum. A (32g) needle was used.   Injection: 1.25 mg Bevacizumab 1.'25mg'$ /0.37m   Route: Intravitreal, Site: Left Eye   NDC:: 85885-027-74 Lot:: [id number] Expiration date: 11/27/2021   Post-op Post injection exam found visual acuity of at least counting fingers. The patient tolerated the procedure well. There were no complications. The patient received written and verbal post procedure care education.            ASSESSMENT/PLAN:    ICD-10-CM   1. Exudative age-related macular degeneration of left eye with active choroidal neovascularization (HCC)  H35.3221 OCT, Retina - OU - Both Eyes    Intravitreal Injection, Pharmacologic Agent - OS - Left Eye    Bevacizumab (AVASTIN) SOLN 1.25 mg    CANCELED: Intravitreal Injection, Pharmacologic Agent - OD - Right Eye    2. Intermediate stage nonexudative age-related macular degeneration of right eye  H35.3112     3.  Diabetes mellitus type 2 without retinopathy ([place]  E11.9     4. Essential hypertension  I10     5. Hypertensive retinopathy of both eyes  H35.033     6. Pseudophakia of both eyes  Z96.1      1. Exudative age-related macular degeneration, left eye  - conversion of OS from nonexudative to exudative ARMD prior to 02.05.20 visit  - s/p IVA OS #1 (02.05.20), #2 (03.04.20), #3 (04.30.20), #4 (06.02.20), #5 (07.14.20), #6 (9.1.20), #7 (10.27.20), #8 (12.23.20), #9 (02.03.21), #10 (03.17.21), #11 (04.28.21), #12 (06.09.21), #13 (08.04.21), #14 (09.29.21), #15 (11.22.2021), #16 (01.26.22), #17 (04.06.22), #18 (06.28.22), #19 (9.13.22), #20 (11.29.22), #21 (01.31.23), #22 (04.04.23), #23 (05.30.23)  **history of recurrent / worsening SRF at 9 wk interval, noted on 04.04.23 visit**  - OCT shows stable improvement in SRF overlying low PED at 8 wks   - BCVA stable at 20/30   - recommend IVA OS #24 today, 07.27.23 w/ f/u in 8 wks  - pt wishes to be treated with IVA OS  - risks and benefits of intravitreal avastin discussed with patient  - informed consent obtained  - see procedure note  - Avastin informed consent obtained, re-signed and scanned 05.30.23  - f/u in 8 wks for DFE, OCT, likely injection OS   2. Age  related macular degeneration, non-exudative, right eye  - stable, former pt of Dr. Zigmund Daniel -- no significant change in OCT or exam  - The incidence, anatomy, and pathology of dry AMD, risk of progression, and the AREDS and AREDS 2 study including smoking risks discussed with patient.  - recommend amsler grid monitoring  3. Diabetes mellitus, type 2 without retinopathy  - The incidence, risk factors for progression, natural history and treatment options for diabetic retinopathy  were discussed with patient.    - The need for close monitoring of blood glucose, blood pressure, and serum lipids, avoiding cigarette or any type of tobacco, and the need for long term follow up was also discussed  with patient.  - f/u in 1 year  4,5. Hypertensive retinopathy OU  - discussed importance of tight BP control  - monitor  6. Pseudophakia OU  - s/p CE/IOL OU  - beautiful surgeries, doing well  - monitor  Ophthalmic Meds Ordered this visit:  Meds ordered this encounter  Medications   Bevacizumab (AVASTIN) SOLN 1.25 mg    Return for 8 wk exu ARMD OS, DFE, OCT, possible injection.  There are no Patient Instructions on file for this visit.   This document serves as a record of services personally performed by Gardiner Sleeper, MD, PhD. It was created on their behalf by San Jetty. Owens Shark, OA an ophthalmic technician. The creation of this record is the provider's dictation and/or activities during the visit.    Electronically signed by: San Jetty. Owens Shark, [place] 07.19.2023 10:09 PM  Gardiner Sleeper, M.D., Ph.D. Diseases & Surgery of the Retina and Vitreous [facility]  I have reviewed the above documentation for accuracy and completeness, and I agree with the above. Gardiner Sleeper, M.D., Ph.D. 08/20/21 10:09 PM   Abbreviations: M myopia (nearsighted); A astigmatism; H hyperopia (farsighted); P presbyopia; Mrx spectacle prescription;  CTL contact lenses; OD right eye; OS left eye; OU both eyes  XT exotropia; ET esotropia; PEK punctate epithelial keratitis; PEE punctate epithelial erosions; DES dry eye syndrome; MGD meibomian gland dysfunction; ATs artificial tears; PFAT's preservative free artificial tears; [place] nuclear sclerotic cataract; PSC posterior subcapsular cataract; ERM epi-retinal membrane; PVD posterior vitreous detachment; RD retinal detachment; DM diabetes mellitus; DR diabetic retinopathy; NPDR non-proliferative diabetic retinopathy; PDR proliferative diabetic retinopathy; CSME clinically significant macular edema; DME diabetic macular edema; dbh dot blot hemorrhages; CWS cotton wool spot; POAG primary open angle glaucoma; C/D cup-to-disc ratio; HVF humphrey  visual field; GVF goldmann visual field; OCT optical coherence tomography; IOP intraocular pressure; BRVO Branch retinal vein occlusion; CRVO central retinal vein occlusion; CRAO central retinal artery occlusion; BRAO branch retinal artery occlusion; RT retinal tear; SB scleral buckle; PPV pars plana vitrectomy; VH Vitreous hemorrhage; PRP panretinal laser photocoagulation; IVK intravitreal kenalog; VMT vitreomacular traction; MH Macular hole;  NVD neovascularization of the disc; NVE neovascularization elsewhere; AREDS age related eye disease study; ARMD age related macular degeneration; POAG primary open angle glaucoma; EBMD epithelial/anterior basement membrane dystrophy; ACIOL anterior chamber intraocular lens; IOL intraocular lens; PCIOL posterior chamber intraocular lens; Phaco/IOL phacoemulsification with intraocular lens placement; [place] photorefractive keratectomy; LASIK laser assisted in situ keratomileusis; HTN hypertension; DM diabetes mellitus; COPD chronic obstructive pulmonary disease

## 2021-08-18 ENCOUNTER — Encounter (INDEPENDENT_AMBULATORY_CARE_PROVIDER_SITE_OTHER): Payer: Medicare Other | Admitting: Ophthalmology

## 2021-08-18 ENCOUNTER — Encounter: Payer: Self-pay | Admitting: Dermatology

## 2021-08-18 ENCOUNTER — Ambulatory Visit: Payer: Medicare Other | Admitting: Dermatology

## 2021-08-18 DIAGNOSIS — D485 Neoplasm of uncertain behavior of skin: Secondary | ICD-10-CM

## 2021-08-18 DIAGNOSIS — C4441 Basal cell carcinoma of skin of scalp and neck: Secondary | ICD-10-CM | POA: Diagnosis not present

## 2021-08-18 DIAGNOSIS — I10 Essential (primary) hypertension: Secondary | ICD-10-CM

## 2021-08-18 DIAGNOSIS — E119 Type 2 diabetes mellitus without complications: Secondary | ICD-10-CM

## 2021-08-18 DIAGNOSIS — Z961 Presence of intraocular lens: Secondary | ICD-10-CM

## 2021-08-18 DIAGNOSIS — H353221 Exudative age-related macular degeneration, left eye, with active choroidal neovascularization: Secondary | ICD-10-CM

## 2021-08-18 DIAGNOSIS — H35033 Hypertensive retinopathy, bilateral: Secondary | ICD-10-CM

## 2021-08-18 DIAGNOSIS — H353112 Nonexudative age-related macular degeneration, right eye, intermediate dry stage: Secondary | ICD-10-CM

## 2021-08-18 NOTE — Patient Instructions (Signed)

## 2021-08-20 ENCOUNTER — Encounter (INDEPENDENT_AMBULATORY_CARE_PROVIDER_SITE_OTHER): Payer: Self-pay | Admitting: Ophthalmology

## 2021-08-20 ENCOUNTER — Ambulatory Visit (INDEPENDENT_AMBULATORY_CARE_PROVIDER_SITE_OTHER): Payer: Medicare Other | Admitting: Ophthalmology

## 2021-08-20 DIAGNOSIS — Z961 Presence of intraocular lens: Secondary | ICD-10-CM

## 2021-08-20 DIAGNOSIS — I10 Essential (primary) hypertension: Secondary | ICD-10-CM

## 2021-08-20 DIAGNOSIS — H353112 Nonexudative age-related macular degeneration, right eye, intermediate dry stage: Secondary | ICD-10-CM | POA: Diagnosis not present

## 2021-08-20 DIAGNOSIS — H353221 Exudative age-related macular degeneration, left eye, with active choroidal neovascularization: Secondary | ICD-10-CM | POA: Diagnosis not present

## 2021-08-20 DIAGNOSIS — H35033 Hypertensive retinopathy, bilateral: Secondary | ICD-10-CM

## 2021-08-20 DIAGNOSIS — E119 Type 2 diabetes mellitus without complications: Secondary | ICD-10-CM

## 2021-08-20 MED ORDER — BEVACIZUMAB CHEMO INJECTION 1.25MG/0.05ML SYRINGE FOR KALEIDOSCOPE
1.2500 mg | INTRAVITREAL | Status: AC | PRN
  Administered 2021-08-20: 1.25 mg via INTRAVITREAL

## 2021-08-21 ENCOUNTER — Ambulatory Visit
Admission: RE | Admit: 2021-08-21 | Discharge: 2021-08-21 | Disposition: A | Discharge status: Home / Self Care | Payer: Medicare Other | Source: Ambulatory Visit | Attending: Gastroenterology | Admitting: Gastroenterology

## 2021-08-21 DIAGNOSIS — R198 Other specified symptoms and signs involving the digestive system and abdomen: Secondary | ICD-10-CM

## 2021-08-21 DIAGNOSIS — N1330 Unspecified hydronephrosis: Secondary | ICD-10-CM | POA: Diagnosis not present

## 2021-08-21 DIAGNOSIS — R1032 Left lower quadrant pain: Secondary | ICD-10-CM | POA: Diagnosis not present

## 2021-08-24 ENCOUNTER — Telehealth: Payer: Self-pay | Admitting: *Deleted

## 2021-08-24 NOTE — Telephone Encounter (Signed)
-----   Message from Lavonna Monarch, MD sent at 08/20/2021  8:49 PM EDT ----- Record result should read"broad" lesion

## 2021-08-24 NOTE — Telephone Encounter (Signed)
Path to patient.

## 2021-09-04 DIAGNOSIS — H353221 Exudative age-related macular degeneration, left eye, with active choroidal neovascularization: Secondary | ICD-10-CM | POA: Diagnosis not present

## 2021-09-04 DIAGNOSIS — H04123 Dry eye syndrome of bilateral lacrimal glands: Secondary | ICD-10-CM | POA: Diagnosis not present

## 2021-09-04 DIAGNOSIS — E119 Type 2 diabetes mellitus without complications: Secondary | ICD-10-CM | POA: Diagnosis not present

## 2021-09-04 DIAGNOSIS — H353112 Nonexudative age-related macular degeneration, right eye, intermediate dry stage: Secondary | ICD-10-CM | POA: Diagnosis not present

## 2021-09-04 DIAGNOSIS — H524 Presbyopia: Secondary | ICD-10-CM | POA: Diagnosis not present

## 2021-09-07 ENCOUNTER — Other Ambulatory Visit (HOSPITAL_COMMUNITY): Payer: Self-pay | Admitting: Internal Medicine

## 2021-09-07 ENCOUNTER — Other Ambulatory Visit: Payer: Self-pay | Admitting: Internal Medicine

## 2021-09-07 DIAGNOSIS — N281 Cyst of kidney, acquired: Secondary | ICD-10-CM

## 2021-09-10 ENCOUNTER — Encounter: Payer: Self-pay | Admitting: Dermatology

## 2021-09-10 NOTE — Progress Notes (Signed)
   Follow-Up Visit   Subjective  Sue [name] is a 88 y.o. female who presents for the following: Follow-up (3 month follow up, patient still scratching the right neck see march photo ).  Still has crust near site of skin cancer right neck Location:  Duration:  Quality:  Associated Signs/Symptoms: Modifying Factors:  Severity:  Timing: Context:   Objective  Well appearing patient in no apparent distress; mood and affect are within normal limits. Right Anterior Neck 1.2 centimeter waxy crust posterior to white scar.  [id number], 1.2 cm waxy pink crust contiguous posterior to 2 cm hypopigmented scar, so presumably recurrent superficial carcinoma.        A focused examination was performed including the neck. Relevant physical exam findings are noted in the Assessment and Plan.   Assessment & Plan    Basal cell carcinoma of skin of scalp and neck Right Anterior Neck  Skin / nail biopsy Type of biopsy: tangential   Informed consent: discussed and consent obtained   Timeout: patient name, date of birth, surgical site, and procedure verified   Anesthesia: the lesion was anesthetized in a standard fashion   Anesthetic:  1% lidocaine w/ epinephrine 1-100,000 local infiltration Instrument used: flexible razor blade   Hemostasis achieved with: aluminum chloride and electrodesiccation   Outcome: patient tolerated procedure well   Post-procedure details: wound care instructions given    Destruction of lesion Complexity: simple   Destruction method: electrodesiccation and curettage   Informed consent: discussed and consent obtained   Timeout:  patient name, date of birth, surgical site, and procedure verified Anesthesia: the lesion was anesthetized in a standard fashion   Anesthetic:  1% lidocaine w/ epinephrine 1-100,000 local infiltration Curettage cycles:  3 Lesion length (cm):  1.7 Lesion width (cm):  1.7 Margin per side (cm):  0 Final wound size (cm):   1.7 Hemostasis achieved with:  ferric subsulfate Outcome: patient tolerated procedure well with no complications   Post-procedure details: sterile dressing applied and wound care instructions given   Dressing type: bandage and petrolatum    Specimen 1 - Surgical pathology Differential Diagnosis: bcc tx with bx  [id number]   Check Margins: No  1.2 cm waxy pink crust contiguous posterior to 2 cm hypopigmented scar, so presumably recurrent superficial carcinoma.  After shave biopsy the base was treated with curettage plus cautery.      I, Lavonna Monarch, MD, have reviewed all documentation for this visit.  The documentation on 09/10/21 for the exam, diagnosis, procedures, and orders are all accurate and complete.

## 2021-10-01 NOTE — Progress Notes (Signed)
[facility] Note  10/13/2021   CHIEF COMPLAINT Patient presents for Retina Follow Up  HISTORY OF PRESENT ILLNESS: Sue [name] is a 88 y.o. female who presents to the clinic today for:  HPI     Retina Follow Up   Patient presents with  Wet AMD.  In left eye.  Severity is moderate.  Duration of 8 weeks.  Since onset it is stable.  I, the attending physician,  performed the HPI with the patient and updated documentation appropriately.        Comments   Patient states vision the same OU.      Last edited by Bernarda Caffey, MD on 10/13/2021  4:36 PM.      Referring physician: Merrilee Seashore, [address] [place],  [place] [address]  HISTORICAL INFORMATION:   Selected notes from the MEDICAL RECORD NUMBER Referred by Dr. Quentin Ore for concern of ARMD OU   CURRENT MEDICATIONS: Current Outpatient Medications (Ophthalmic Drugs)  Medication Sig   Polyethyl Glycol-Propyl Glycol (SYSTANE) 0.4-0.3 % SOLN Place 1 drop into both eyes 3 (three) times daily as needed (dry eyes).   RESTASIS 0.05 % ophthalmic emulsion Place 1 drop into both eyes 2 (two) times daily.   No current facility-administered medications for this visit. (Ophthalmic Drugs)   Current Outpatient Medications (Other)  Medication Sig   amLODipine (NORVASC) 10 MG tablet Take 1 tablet by mouth daily.   B-D ULTRAFINE III SHORT PEN 31G X 8 MM MISC USE AS DIRECTED ONCE DAILY SUBCUTANEOUSLY   Calcium Citrate-Vitamin D (CALCIUM + D PO)    calcium-vitamin D (OSCAL WITH D) 500-200 MG-UNIT tablet Take 1 tablet by mouth 2 (two) times daily.    Continuous Blood Gluc Sensor (FREESTYLE LIBRE 2 SENSOR) MISC See admin instructions.   Cyanocobalamin (B-12 PO)    diphenhydramine-acetaminophen (TYLENOL PM) 25-500 MG TABS tablet Take 1 tablet by mouth at bedtime as needed (sleep).   Glucagon (GVOKE HYPOPEN 2-PACK) 0.5 MG/0.1ML SOAJ as needed for hypoglycemia   glucose blood test  strip OneTouch Ultra Test strips  TEST ONCE A DAY ONCE A DAY FINGERSTICK 90   hydrALAZINE (APRESOLINE) 25 MG tablet Take 3 tablets (75 mg total) by mouth every 8 (eight) hours.   hydrochlorothiazide (HYDRODIURIL) 25 MG tablet Take 25 mg by mouth daily.   ibuprofen (ADVIL) 200 MG tablet Take 400 mg by mouth every 6 (six) hours as needed for moderate pain.   Insulin Degludec (TRESIBA FLEXTOUCH) 200 UNIT/ML SOPN Inject 36 Units into the skin at bedtime. (Patient taking differently: Inject 22 Units into the skin at bedtime.)   Insulin Pen Needle (PEN NEEDLES) 32G X 4 MM MISC BD Ultra-Fine Nano Pen Needle 32 gauge x 5/32"  USE AS DIRECTED DAILY   ketoconazole (NIZORAL) 2 % cream Apply topically.   Lancets (ONETOUCH ULTRASOFT) lancets OneTouch UltraSoft Lancets  USE TWICE DAILY   Melatonin 10 MG CAPS Take 10 mg by mouth at bedtime as needed (sleep).   meloxicam (MOBIC) 7.5 MG tablet Take 1 tablet (7.5 mg total) by mouth daily as needed for pain.   Multiple Vitamins-Minerals (ADULT ONE DAILY GUMMIES) CHEW Chew 1 tablet by mouth 2 (two) times daily. Centrum   Multiple Vitamins-Minerals (AIRBORNE) TBEF Take 1 tablet by mouth daily as needed (immune support).   Multiple Vitamins-Minerals (CENTRUM SILVER PO)    Multiple Vitamins-Minerals (PRESERVISION AREDS 2) CAPS Take 1 capsule by mouth 2 (two) times daily.   mupirocin  ointment (BACTROBAN) 2 % Apply to right foot ulcer once daily. (Patient taking differently: Apply 1 application  topically daily as needed (wound care). Apply to right foot ulcer once daily.)   nortriptyline (PAMELOR) 25 MG capsule Take 1 capsule (25 mg total) by mouth at bedtime. (Patient taking differently: Take 25 mg by mouth 2 (two) times daily.)   Omega-3 Fatty Acids (FISH OIL) 1200 MG CAPS Take 1,200 mg by mouth daily with lunch.    omeprazole (PRILOSEC) 20 MG capsule Take 1 capsule by mouth daily.   ondansetron (ZOFRAN) 4 MG tablet Take 4 mg by mouth 3 (three) times daily as needed.    ONE TOUCH ULTRA TEST test strip    PRESCRIPTION MEDICATION by Intravitreal route every 30 (thirty) days. Ophthalmic injection at MD office in left eye   silver sulfADIAZINE (SILVADENE) 1 % cream Apply pea-sized amount to wound daily. (Patient taking differently: Apply 1 application  topically daily. Apply pea-sized amount to wound daily.)   simvastatin (ZOCOR) 20 MG tablet Take 1 tablet by mouth daily.   traMADol (ULTRAM) 50 MG tablet Take 1 tablet (50 mg total) by mouth 2 (two) times daily as needed.   vitamin B-12 (CYANOCOBALAMIN) 1000 MCG tablet Take 1,000 mcg by mouth 2 (two) times daily.    Current Facility-Administered Medications (Other)  Medication Route   Bevacizumab (AVASTIN) SOLN 1.25 mg Intravitreal   Bevacizumab (AVASTIN) SOLN 1.25 mg Intravitreal   REVIEW OF SYSTEMS: ROS   Positive for: Neurological, Skin, Genitourinary, Musculoskeletal, Endocrine, Eyes Negative for: Constitutional, Gastrointestinal, HENT, Cardiovascular, Respiratory, Psychiatric, Allergic/Imm, Heme/Lymph Last edited by Roselee Nova D, COT on 10/13/2021  1:47 PM.     ALLERGIES No Known Allergies  PAST MEDICAL HISTORY Past Medical History:  Diagnosis Date   Anemia    Arthritis    Basal cell carcinoma 11/23/2017   left elbow, right neck TX CX3 5FU    Basal cell carcinoma 04/04/2019   infil-right sideburn (CX35FU)   Chronic kidney disease    stage 3 ckd stage 3 b per dr Ashby Dawes 09-26-2019 note on chart   Complication of anesthesia    deifficulty waking up   COVID-19    Diabetic neuropathy ([place])    Hyperlipidemia    Hypertension    Hypertensive retinopathy    OU   Infiltrative basal cell carcinoma (BCC) 04/22/2020   Right Zygomatic Area    Macular degeneration    OU   Nodular basal cell carcinoma (BCC) 04/22/2020   Right Anterior Neck   Pneumonia    Right carotid bruit per dr Ashby Dawes 09-26-2019 note on chart   SCC (squamous cell carcinoma) 11/23/2017   right forehead TX CX3 5FU    SCCA (squamous cell carcinoma) of skin 04/22/2020   Mid Parietal Scalp (in situ)   SCCA (squamous cell carcinoma) of skin 04/22/2020   Right Dorsal Hand (in situ)   Type 2 DM with CKD stage 3 and hypertension ([place])    Past Surgical History:  Procedure Laterality Date   ABDOMINAL HYSTERECTOMY     AMPUTATION TOE Right 09/28/2019   Procedure: Right 2nd toe amputation - partial;  Surgeon: Evelina Bucy, DPM;  Location: [facility];  Service: Podiatry;  Laterality: Right;   CATARACT EXTRACTION     CHOLECYSTECTOMY     EYE SURGERY     HERNIA REPAIR     SPINE SURGERY     TONSILLECTOMY     FAMILY HISTORY History reviewed. No pertinent family history.  SOCIAL HISTORY  Social History   Tobacco Use   Smoking status: Former   Smokeless tobacco: Never  Scientific laboratory technician Use: Never used  Substance Use Topics   Alcohol use: No    Alcohol/week: 0.0 standard drinks of alcohol   Drug use: No       OPHTHALMIC EXAM:  Base Eye Exam     Visual Acuity (Snellen - Linear)       Right Left   Dist cc 20/30 -2 20/50 +2   Dist ph cc NI 20/40 +1    Correction: Glasses         Tonometry (Tonopen, 1:55 PM)       Right Left   Pressure 15 16         Pupils       Dark Light Shape React APD   Right 4 3 Round Brisk None   Left 4 3 Round Brisk None         Visual Fields (Counting fingers)       Left Right    Full Full         Extraocular Movement       Right Left    Full, Ortho Full, Ortho         Neuro/Psych     Oriented x3: Yes   Mood/Affect: Normal         Dilation     Both eyes: 1.0% Mydriacyl, 2.5% Phenylephrine @ 1:54 PM           Slit Lamp and Fundus Exam     Slit Lamp Exam       Right Left   Lids/Lashes Dermatochalasis - upper lid, Telangiectasia, mild MGD Dermatochalasis - upper lid, Telangiectasia, Meibomian gland dysfunction   Conjunctiva/Sclera White and quiet White and quiet   Cornea Temporal Well healed cataract  wounds, mild Arcus, trace endo pigment nasally, 1+PEE Arcus, Temporal Well healed cataract wounds, 1-2+ PEE   Anterior Chamber Deep and quiet Deep and quiet   Iris Round and moderately dilated to 4.45m Round and moderately dilated to 550m Iris atrophy from 0100 to 0230, peripheral irodotimy at 0200   Lens 3-piece PC IOL in good position PC IOL in good position   Anterior Vitreous Vitreous syneresis, Posterior vitreous detachment, Weiss ring, vitreous condensations Vitreous syneresis, PVD, vitreous condensations         Fundus Exam       Right Left   Disc 360 Peripapillary atrophy, diffuse mild pallor, compact, sharp rim mild pallor, Peripapillary atrophy and pigmentation   C/D Ratio 0.1 0.2   Macula Blunted foveal reflex, +PED, RPE mottling, clumping and early atrophy, Drusen, No heme or edema Blunted foveal reflex, low lying PED / +CNVM with stable improvement in IRF, shallow SRF, RPE mottling and clumping, Drusen, No heme   Vessels Attenuated, Tortuous Attenuated, Tortuous   Periphery Attached, 360 Reticular degeneration Attached, 360 Reticular degeneration, No heme           Refraction     Wearing Rx       Sphere Cylinder Axis   Right -1.50 +0.75 180   Left -0.50 +0.75 180           IMAGING AND PROCEDURES  Imaging and Procedures for '@TODAY'$ @  OCT, Retina - OU - Both Eyes       Right Eye Quality was good. Central Foveal Thickness: 233. Progression has been stable. Findings include normal foveal contour, no IRF, no SRF, retinal drusen , intraretinal hyper-reflective material,  pigment epithelial detachment, outer retinal atrophy (Persistent central PED, +IRHM overlying).   Left Eye Quality was good. Central Foveal Thickness: 256. Progression has worsened. Findings include no IRF, abnormal foveal contour, retinal drusen , epiretinal membrane, pigment epithelial detachment, subretinal fluid, outer retinal atrophy (Interval increase in shallow SRF overlying low PED; partial  PVD).   Notes *Images captured and stored on drive  Diagnosis / Impression:  OD: Non-Exudative ARMD -- stable OS: Exudative ARMD -- Interval increase in shallow SRF overlying low PED; partial PVD  Clinical management:  See below  Abbreviations: NFP - Normal foveal profile. CME - cystoid macular edema. PED - pigment epithelial detachment. IRF - intraretinal fluid. SRF - subretinal fluid. EZ - ellipsoid zone. ERM - epiretinal membrane. ORA - outer retinal atrophy. ORT - outer retinal tubulation. SRHM - subretinal hyper-reflective material       Intravitreal Injection, Pharmacologic Agent - OS - Left Eye       Time Out 10/13/2021. 2:35 PM. Confirmed correct patient, procedure, site, and patient consented.   Anesthesia Topical anesthesia was used. Anesthetic medications included Lidocaine 2%, Proparacaine 0.5%.   Procedure Preparation included 5% betadine to ocular surface, eyelid speculum. A (32g) needle was used.   Injection: 1.25 mg Bevacizumab 1.'25mg'$ /0.85m   Route: Intravitreal, Site: Left Eye   NDC: [id number] Lot:: [id number] Expiration date: 11/17/2021   Post-op Post injection exam found visual acuity of at least counting fingers. The patient tolerated the procedure well. There were no complications. The patient received written and verbal post procedure care education.            ASSESSMENT/PLAN:    ICD-10-CM   1. Exudative age-related macular degeneration of left eye with active choroidal neovascularization (HCC)  H35.3221 OCT, Retina - OU - Both Eyes    Intravitreal Injection, Pharmacologic Agent - OS - Left Eye    Bevacizumab (AVASTIN) SOLN 1.25 mg    2. Intermediate stage nonexudative age-related macular degeneration of right eye  H35.3112     3. Diabetes mellitus type 2 without retinopathy ([place]  E11.9     4. Essential hypertension  I10     5. Hypertensive retinopathy of both eyes  H35.033     6. Pseudophakia of both eyes  Z96.1       1.  Exudative age-related macular degeneration, left eye  - conversion of OS from nonexudative to exudative ARMD prior to 02.05.20 visit  - s/p IVA OS #1 (02.05.20), #2 (03.04.20), #3 (04.30.20), #4 (06.02.20), #5 (07.14.20), #6 (9.1.20), #7 (10.27.20), #8 (12.23.20), #9 (02.03.21), #10 (03.17.21), #11 (04.28.21), #12 (06.09.21), #13 (08.04.21), #14 (09.29.21), #15 (11.22.2021), #16 (01.26.22), #17 (04.06.22), #18 (06.28.22), #19 (9.13.22), #20 (11.29.22), #21 (01.31.23), #22 (04.04.23), #23 (05.30.23), #24 (07.27.23)  **history of recurrent / worsening SRF at 9 wk interval, noted on 04.04.23 visit**  - OCT shows interval increase in shallow SRF overlying low PED; partial PVD at 8 weeks  - BCVA decreased to 20/40 from 20/30   - recommend IVA OS #25 today, 09.19.23 w/ f/u in 7-8 wks  - pt wishes to be treated with IVA OS  - risks and benefits of intravitreal avastin discussed with patient  - informed consent obtained  - see procedure note  - Avastin informed consent obtained, re-signed and scanned 05.30.23  - f/u in 7-8 wks for DFE, OCT, likely injection OS   2. Age related macular degeneration, non-exudative, right eye  - stable, former pt of Dr. MZigmund Daniel-- no significant change in OCT or exam  -  The incidence, anatomy, and pathology of dry AMD, risk of progression, and the AREDS and AREDS 2 study including smoking risks discussed with patient.  - recommend amsler grid monitoring  3. Diabetes mellitus, type 2 without retinopathy  - The incidence, risk factors for progression, natural history and treatment options for diabetic retinopathy  were discussed with patient.    - The need for close monitoring of blood glucose, blood pressure, and serum lipids, avoiding cigarette or any type of tobacco, and the need for long term follow up was also discussed with patient.  - f/u in 1 year  4,5. Hypertensive retinopathy OU  - discussed importance of tight BP control  - monitor  6. Pseudophakia OU  -  s/p CE/IOL OU  - beautiful surgeries, doing well  - monitor  Ophthalmic Meds Ordered this visit:  Meds ordered this encounter  Medications   Bevacizumab (AVASTIN) SOLN 1.25 mg    Return for f/u 7-8 weeks, exu ARMD OS, DFE, OCT.  There are no Patient Instructions on file for this visit.   This document serves as a record of services personally performed by Gardiner Sleeper, MD, PhD. It was created on their behalf by Renaldo Reel, [place] an ophthalmic technician. The creation of this record is the provider's dictation and/or activities during the visit.    Electronically signed by:  Renaldo Reel, COT  10/01/21  4:54 PM  This document serves as a record of services personally performed by Gardiner Sleeper, MD, PhD. It was created on their behalf by San Jetty. Owens Shark, OA an ophthalmic technician. The creation of this record is the provider's dictation and/or activities during the visit.    Electronically signed by: San Jetty. Owens Shark, [place] 09.19.2023 4:54 PM  Gardiner Sleeper, M.D., Ph.D. Diseases & Surgery of the Retina and Vitreous [facility]  I have reviewed the above documentation for accuracy and completeness, and I agree with the above. Gardiner Sleeper, M.D., Ph.D. 10/13/21 5:12 PM   Abbreviations: M myopia (nearsighted); A astigmatism; H hyperopia (farsighted); P presbyopia; Mrx spectacle prescription;  CTL contact lenses; OD right eye; OS left eye; OU both eyes  XT exotropia; ET esotropia; PEK punctate epithelial keratitis; PEE punctate epithelial erosions; DES dry eye syndrome; MGD meibomian gland dysfunction; ATs artificial tears; PFAT's preservative free artificial tears; [place] nuclear sclerotic cataract; PSC posterior subcapsular cataract; ERM epi-retinal membrane; PVD posterior vitreous detachment; RD retinal detachment; DM diabetes mellitus; DR diabetic retinopathy; NPDR non-proliferative diabetic retinopathy; PDR proliferative diabetic retinopathy; CSME  clinically significant macular edema; DME diabetic macular edema; dbh dot blot hemorrhages; CWS cotton wool spot; POAG primary open angle glaucoma; C/D cup-to-disc ratio; HVF humphrey visual field; GVF goldmann visual field; OCT optical coherence tomography; IOP intraocular pressure; BRVO Branch retinal vein occlusion; CRVO central retinal vein occlusion; CRAO central retinal artery occlusion; BRAO branch retinal artery occlusion; RT retinal tear; SB scleral buckle; PPV pars plana vitrectomy; VH Vitreous hemorrhage; PRP panretinal laser photocoagulation; IVK intravitreal kenalog; VMT vitreomacular traction; MH Macular hole;  NVD neovascularization of the disc; NVE neovascularization elsewhere; AREDS age related eye disease study; ARMD age related macular degeneration; POAG primary open angle glaucoma; EBMD epithelial/anterior basement membrane dystrophy; ACIOL anterior chamber intraocular lens; IOL intraocular lens; PCIOL posterior chamber intraocular lens; Phaco/IOL phacoemulsification with intraocular lens placement; [place] photorefractive keratectomy; LASIK laser assisted in situ keratomileusis; HTN hypertension; DM diabetes mellitus; COPD chronic obstructive pulmonary disease

## 2021-10-07 ENCOUNTER — Ambulatory Visit (HOSPITAL_COMMUNITY)
Admission: RE | Admit: 2021-10-07 | Discharge: 2021-10-07 | Disposition: A | Discharge status: Home / Self Care | Payer: Medicare Other | Source: Ambulatory Visit | Attending: Internal Medicine | Admitting: Internal Medicine

## 2021-10-07 DIAGNOSIS — K529 Noninfective gastroenteritis and colitis, unspecified: Secondary | ICD-10-CM | POA: Diagnosis not present

## 2021-10-07 DIAGNOSIS — N1330 Unspecified hydronephrosis: Secondary | ICD-10-CM | POA: Diagnosis not present

## 2021-10-07 DIAGNOSIS — K2970 Gastritis, unspecified, without bleeding: Secondary | ICD-10-CM | POA: Diagnosis not present

## 2021-10-07 DIAGNOSIS — N281 Cyst of kidney, acquired: Secondary | ICD-10-CM | POA: Insufficient documentation

## 2021-10-07 MED ORDER — GADOBUTROL 1 MMOL/ML IV SOLN
7.0000 mL | Freq: Once | INTRAVENOUS | Status: AC | PRN
  Administered 2021-10-07: 7 mL via INTRAVENOUS

## 2021-10-13 ENCOUNTER — Ambulatory Visit (INDEPENDENT_AMBULATORY_CARE_PROVIDER_SITE_OTHER): Payer: Medicare Other | Admitting: Podiatry

## 2021-10-13 ENCOUNTER — Encounter: Payer: Self-pay | Admitting: Podiatry

## 2021-10-13 ENCOUNTER — Encounter (INDEPENDENT_AMBULATORY_CARE_PROVIDER_SITE_OTHER): Payer: Self-pay | Admitting: Ophthalmology

## 2021-10-13 ENCOUNTER — Ambulatory Visit (INDEPENDENT_AMBULATORY_CARE_PROVIDER_SITE_OTHER): Payer: Medicare Other | Admitting: Ophthalmology

## 2021-10-13 DIAGNOSIS — M79675 Pain in left toe(s): Secondary | ICD-10-CM

## 2021-10-13 DIAGNOSIS — H353221 Exudative age-related macular degeneration, left eye, with active choroidal neovascularization: Secondary | ICD-10-CM | POA: Diagnosis not present

## 2021-10-13 DIAGNOSIS — E119 Type 2 diabetes mellitus without complications: Secondary | ICD-10-CM

## 2021-10-13 DIAGNOSIS — L84 Corns and callosities: Secondary | ICD-10-CM

## 2021-10-13 DIAGNOSIS — I10 Essential (primary) hypertension: Secondary | ICD-10-CM | POA: Diagnosis not present

## 2021-10-13 DIAGNOSIS — Z961 Presence of intraocular lens: Secondary | ICD-10-CM | POA: Diagnosis not present

## 2021-10-13 DIAGNOSIS — M79674 Pain in right toe(s): Secondary | ICD-10-CM | POA: Diagnosis not present

## 2021-10-13 DIAGNOSIS — H35033 Hypertensive retinopathy, bilateral: Secondary | ICD-10-CM

## 2021-10-13 DIAGNOSIS — Z89421 Acquired absence of other right toe(s): Secondary | ICD-10-CM

## 2021-10-13 DIAGNOSIS — E1142 Type 2 diabetes mellitus with diabetic polyneuropathy: Secondary | ICD-10-CM

## 2021-10-13 DIAGNOSIS — B351 Tinea unguium: Secondary | ICD-10-CM

## 2021-10-13 DIAGNOSIS — H353112 Nonexudative age-related macular degeneration, right eye, intermediate dry stage: Secondary | ICD-10-CM | POA: Diagnosis not present

## 2021-10-13 MED ORDER — BEVACIZUMAB CHEMO INJECTION 1.25MG/0.05ML SYRINGE FOR KALEIDOSCOPE
1.2500 mg | INTRAVITREAL | Status: AC | PRN
  Administered 2021-10-13: 1.25 mg via INTRAVITREAL

## 2021-10-18 NOTE — Progress Notes (Signed)
  Subjective:  Patient ID: Sue [name], female    DOB: 1932-07-12,  MRN: [id number]  Sue [name] presents to clinic today for at risk foot care. Patient has h/o NIDDM, neuropathy with partial amputation of R 2nd toe and painful elongated mycotic toenails 1-5 bilaterally which are tender when wearing enclosed shoe gear. Pain is relieved with periodic professional debridement.  Patient is accompanied by her daughter on today's visit.  Patient states callus has come back on the bottom of her right foot.   PCP is Merrilee Seashore, MD , and last visit was  September 07, 2021.  No Known Allergies  Review of Systems: Negative except as noted in the HPI.  Objective: No changes noted in today's physical examination. Sue [name] is a pleasant 88 y.o. female in NAD. AAO x 3. Vascular CFT immediate b/l LE. Palpable DP/PT pulses b/l LE. Digital hair absent b/l. Skin temperature gradient WNL b/l. No pain with calf compression b/l. No edema noted b/l. No cyanosis or clubbing noted b/l LE.  Neurologic Normal speech. Oriented to person, place, and time. Protective sensation diminished with 10g monofilament b/l.  Dermatologic Pedal skin is warm and supple b/l LE. Pedal skin is warm and supple b/l LE. No open wounds b/l LE. No interdigital macerations noted b/l LE. Toenails 1-5 left, 1, 3-5 right elongated, discolored, dystrophic, thickened, crumbly with subungual debris and tenderness to dorsal palpation. Hyperkeratotic lesion(s) submet head 2 right foot.  No erythema, no edema, no drainage, no fluctuance.  Orthopedic: Muscle strength 5/5 to all lower extremity muscle groups bilaterally. No pain, crepitus or joint limitation noted with ROM bilateral LE. 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   Assessment/Plan: 1. Pain due to onychomycosis of toenails of both feet   2. Callus   3. History of partial amputation of toe of right foot ([place])   4. Diabetic peripheral neuropathy  associated with type 2 diabetes mellitus ([place])   -Patient's family member present. All questions/concerns addressed on today's visit. -Examined patient. -Continue diabetic foot care principles: inspect feet daily, monitor glucose as recommended by PCP and/or Endocrinologist, and follow prescribed diet per PCP, Endocrinologist and/or dietician. -Toenails 1-5 left foot, 3-5 right foot, and right great toe debrided in length and girth without iatrogenic bleeding with sterile nail nipper and dremel.  -Callus(es) submet head 2 right foot pared utilizing sterile scalpel blade without complication or incident. Total number debrided =1. -Patient/POA to call should there be question/concern in the interim.   Return in about 3 months (around 01/12/2022).  Marzetta Board, DPM

## 2021-10-19 ENCOUNTER — Other Ambulatory Visit: Payer: Self-pay | Admitting: Internal Medicine

## 2021-10-19 DIAGNOSIS — N838 Other noninflammatory disorders of ovary, fallopian tube and broad ligament: Secondary | ICD-10-CM

## 2021-10-20 ENCOUNTER — Other Ambulatory Visit: Payer: Self-pay | Admitting: Internal Medicine

## 2021-10-27 DIAGNOSIS — S81811A Laceration without foreign body, right lower leg, initial encounter: Secondary | ICD-10-CM | POA: Diagnosis not present

## 2021-10-27 DIAGNOSIS — Z23 Encounter for immunization: Secondary | ICD-10-CM | POA: Diagnosis not present

## 2021-10-30 ENCOUNTER — Ambulatory Visit
Admission: RE | Admit: 2021-10-30 | Discharge: 2021-10-30 | Disposition: A | Discharge status: Home / Self Care | Payer: Medicare Other | Source: Ambulatory Visit | Attending: Internal Medicine | Admitting: Internal Medicine

## 2021-10-30 DIAGNOSIS — Z780 Asymptomatic menopausal state: Secondary | ICD-10-CM | POA: Diagnosis not present

## 2021-10-30 DIAGNOSIS — N838 Other noninflammatory disorders of ovary, fallopian tube and broad ligament: Secondary | ICD-10-CM

## 2021-11-10 DIAGNOSIS — E1142 Type 2 diabetes mellitus with diabetic polyneuropathy: Secondary | ICD-10-CM | POA: Diagnosis not present

## 2021-11-10 DIAGNOSIS — I129 Hypertensive chronic kidney disease with stage 1 through stage 4 chronic kidney disease, or unspecified chronic kidney disease: Secondary | ICD-10-CM | POA: Diagnosis not present

## 2021-11-10 DIAGNOSIS — N1832 Chronic kidney disease, stage 3b: Secondary | ICD-10-CM | POA: Diagnosis not present

## 2021-11-10 DIAGNOSIS — R5383 Other fatigue: Secondary | ICD-10-CM | POA: Diagnosis not present

## 2021-11-10 DIAGNOSIS — E782 Mixed hyperlipidemia: Secondary | ICD-10-CM | POA: Diagnosis not present

## 2021-11-17 DIAGNOSIS — E1121 Type 2 diabetes mellitus with diabetic nephropathy: Secondary | ICD-10-CM | POA: Diagnosis not present

## 2021-11-17 DIAGNOSIS — E782 Mixed hyperlipidemia: Secondary | ICD-10-CM | POA: Diagnosis not present

## 2021-11-17 DIAGNOSIS — R0989 Other specified symptoms and signs involving the circulatory and respiratory systems: Secondary | ICD-10-CM | POA: Diagnosis not present

## 2021-11-17 DIAGNOSIS — E1165 Type 2 diabetes mellitus with hyperglycemia: Secondary | ICD-10-CM | POA: Diagnosis not present

## 2021-11-17 DIAGNOSIS — N1832 Chronic kidney disease, stage 3b: Secondary | ICD-10-CM | POA: Diagnosis not present

## 2021-11-17 DIAGNOSIS — I129 Hypertensive chronic kidney disease with stage 1 through stage 4 chronic kidney disease, or unspecified chronic kidney disease: Secondary | ICD-10-CM | POA: Diagnosis not present

## 2021-11-17 DIAGNOSIS — Z0000 Encounter for general adult medical examination without abnormal findings: Secondary | ICD-10-CM | POA: Diagnosis not present

## 2021-11-17 NOTE — Progress Notes (Signed)
[facility] Note  12/01/2021   CHIEF COMPLAINT Patient presents for Retina Follow Up  HISTORY OF PRESENT ILLNESS: Sue [name] is a 88 y.o. female who presents to the clinic today for:  HPI     Retina Follow Up   Patient presents with  Wet AMD.  In left eye.  Severity is moderate.  Duration of 8 weeks.  Since onset it is stable.  I, the attending physician,  performed the HPI with the patient and updated documentation appropriately.        Comments   Retina follow up AMD OS IVA OS pt states no vision changes noticed       Last edited by Bernarda Caffey, MD on 12/01/2021 11:14 PM.       Referring physician: Merrilee Seashore, [address] [place],  [place] [address]  HISTORICAL INFORMATION:   Selected notes from the MEDICAL RECORD NUMBER Referred by Dr. Quentin Ore for concern of ARMD OU   CURRENT MEDICATIONS: Current Outpatient Medications (Ophthalmic Drugs)  Medication Sig   Polyethyl Glycol-Propyl Glycol (SYSTANE) 0.4-0.3 % SOLN Place 1 drop into both eyes 3 (three) times daily as needed (dry eyes).   RESTASIS 0.05 % ophthalmic emulsion Place 1 drop into both eyes 2 (two) times daily.   No current facility-administered medications for this visit. (Ophthalmic Drugs)   Current Outpatient Medications (Other)  Medication Sig   amLODipine (NORVASC) 10 MG tablet Take 1 tablet by mouth daily.   B-D ULTRAFINE III SHORT PEN 31G X 8 MM MISC USE AS DIRECTED ONCE DAILY SUBCUTANEOUSLY   Calcium Citrate-Vitamin D (CALCIUM + D PO)    calcium-vitamin D (OSCAL WITH D) 500-200 MG-UNIT tablet Take 1 tablet by mouth 2 (two) times daily.    Continuous Blood Gluc Sensor (FREESTYLE LIBRE 2 SENSOR) MISC See admin instructions.   Cyanocobalamin (B-12 PO)    diphenhydramine-acetaminophen (TYLENOL PM) 25-500 MG TABS tablet Take 1 tablet by mouth at bedtime as needed (sleep).   Glucagon (GVOKE HYPOPEN 2-PACK) 0.5 MG/0.1ML SOAJ as needed for  hypoglycemia   glucose blood test strip OneTouch Ultra Test strips  TEST ONCE A DAY ONCE A DAY FINGERSTICK 90   hydrALAZINE (APRESOLINE) 25 MG tablet Take 3 tablets (75 mg total) by mouth every 8 (eight) hours.   hydrochlorothiazide (HYDRODIURIL) 25 MG tablet Take 25 mg by mouth daily.   ibuprofen (ADVIL) 200 MG tablet Take 400 mg by mouth every 6 (six) hours as needed for moderate pain.   Insulin Degludec (TRESIBA FLEXTOUCH) 200 UNIT/ML SOPN Inject 36 Units into the skin at bedtime. (Patient taking differently: Inject 22 Units into the skin at bedtime.)   Insulin Pen Needle (PEN NEEDLES) 32G X 4 MM MISC BD Ultra-Fine Nano Pen Needle 32 gauge x 5/32"  USE AS DIRECTED DAILY   ketoconazole (NIZORAL) 2 % cream Apply topically.   Lancets (ONETOUCH ULTRASOFT) lancets OneTouch UltraSoft Lancets  USE TWICE DAILY   Melatonin 10 MG CAPS Take 10 mg by mouth at bedtime as needed (sleep).   meloxicam (MOBIC) 7.5 MG tablet Take 1 tablet (7.5 mg total) by mouth daily as needed for pain.   Multiple Vitamins-Minerals (ADULT ONE DAILY GUMMIES) CHEW Chew 1 tablet by mouth 2 (two) times daily. Centrum   Multiple Vitamins-Minerals (AIRBORNE) TBEF Take 1 tablet by mouth daily as needed (immune support).   Multiple Vitamins-Minerals (CENTRUM SILVER PO)    Multiple Vitamins-Minerals (PRESERVISION AREDS 2) CAPS Take 1 capsule by  mouth 2 (two) times daily.   mupirocin ointment (BACTROBAN) 2 % Apply to right foot ulcer once daily. (Patient taking differently: Apply 1 application  topically daily as needed (wound care). Apply to right foot ulcer once daily.)   nortriptyline (PAMELOR) 25 MG capsule Take 1 capsule (25 mg total) by mouth at bedtime. (Patient taking differently: Take 25 mg by mouth 2 (two) times daily.)   Omega-3 Fatty Acids (FISH OIL) 1200 MG CAPS Take 1,200 mg by mouth daily with lunch.    omeprazole (PRILOSEC) 20 MG capsule Take 1 capsule by mouth daily.   ondansetron (ZOFRAN) 4 MG tablet Take 4 mg by  mouth 3 (three) times daily as needed.   ONE TOUCH ULTRA TEST test strip    PRESCRIPTION MEDICATION by Intravitreal route every 30 (thirty) days. Ophthalmic injection at MD office in left eye   silver sulfADIAZINE (SILVADENE) 1 % cream Apply pea-sized amount to wound daily. (Patient taking differently: Apply 1 application  topically daily. Apply pea-sized amount to wound daily.)   simvastatin (ZOCOR) 20 MG tablet Take 1 tablet by mouth daily.   traMADol (ULTRAM) 50 MG tablet Take 1 tablet (50 mg total) by mouth 2 (two) times daily as needed.   vitamin B-12 (CYANOCOBALAMIN) 1000 MCG tablet Take 1,000 mcg by mouth 2 (two) times daily.    Current Facility-Administered Medications (Other)  Medication Route   Bevacizumab (AVASTIN) SOLN 1.25 mg Intravitreal   Bevacizumab (AVASTIN) SOLN 1.25 mg Intravitreal   REVIEW OF SYSTEMS: ROS   Positive for: Eyes Negative for: Constitutional, Gastrointestinal, Neurological, Skin, Genitourinary, Musculoskeletal, HENT, Endocrine, Cardiovascular, Respiratory, Psychiatric, Allergic/Imm, Heme/Lymph Last edited by Bernarda Caffey, MD on 12/01/2021 11:13 PM.     ALLERGIES No Known Allergies  PAST MEDICAL HISTORY Past Medical History:  Diagnosis Date   Anemia    Arthritis    Basal cell carcinoma 11/23/2017   left elbow, right neck TX CX3 5FU    Basal cell carcinoma 04/04/2019   infil-right sideburn (CX35FU)   Chronic kidney disease    stage 3 ckd stage 3 b per dr Ashby Dawes 09-26-2019 note on chart   Complication of anesthesia    deifficulty waking up   COVID-19    Diabetic neuropathy ([place])    Hyperlipidemia    Hypertension    Hypertensive retinopathy    OU   Infiltrative basal cell carcinoma (BCC) 04/22/2020   Right Zygomatic Area    Macular degeneration    OU   Nodular basal cell carcinoma (BCC) 04/22/2020   Right Anterior Neck   Pneumonia    Right carotid bruit per dr Ashby Dawes 09-26-2019 note on chart   SCC (squamous cell carcinoma)  11/23/2017   right forehead TX CX3 5FU   SCCA (squamous cell carcinoma) of skin 04/22/2020   Mid Parietal Scalp (in situ)   SCCA (squamous cell carcinoma) of skin 04/22/2020   Right Dorsal Hand (in situ)   Type 2 DM with CKD stage 3 and hypertension ([employer])    Past Surgical History:  Procedure Laterality Date   ABDOMINAL HYSTERECTOMY     AMPUTATION TOE Right 09/28/2019   Procedure: Right 2nd toe amputation - partial;  Surgeon: Evelina Bucy, DPM;  Location: [facility];  Service: Podiatry;  Laterality: Right;   CATARACT EXTRACTION     CHOLECYSTECTOMY     EYE SURGERY     HERNIA REPAIR     SPINE SURGERY     TONSILLECTOMY     FAMILY HISTORY History reviewed. No  pertinent family history.  SOCIAL HISTORY Social History   Tobacco Use   Smoking status: Former   Smokeless tobacco: Never  Scientific laboratory technician Use: Never used  Substance Use Topics   Alcohol use: No    Alcohol/week: 0.0 standard drinks of alcohol   Drug use: No       OPHTHALMIC EXAM:  Base Eye Exam     Visual Acuity (Snellen - Linear)       Right Left   Dist cc 20/40 -2 20/40 -2   Dist ph cc NI 20/40    Correction: Glasses         Tonometry (Tonopen, 2:05 PM)       Right Left   Pressure 13 14         Pupils       Dark Light Shape React APD   Right 4 3 Round Brisk None   Left 4 3 Round Brisk None         Visual Fields       Left Right    Full Full         Extraocular Movement       Right Left    Full, Ortho Full, Ortho         Neuro/Psych     Oriented x3: Yes   Mood/Affect: Normal         Dilation     Both eyes: 2.5% Phenylephrine @ 2:05 PM           Slit Lamp and Fundus Exam     Slit Lamp Exam       Right Left   Lids/Lashes Dermatochalasis - upper lid, Telangiectasia, mild MGD Dermatochalasis - upper lid, Telangiectasia, Meibomian gland dysfunction   Conjunctiva/Sclera White and quiet White and quiet   Cornea Temporal Well healed cataract  wounds, mild Arcus, trace endo pigment nasally, 1+PEE Arcus, Temporal Well healed cataract wounds, 1-2+ PEE   Anterior Chamber Deep and quiet Deep and quiet   Iris Round and moderately dilated to 4.48m Round and moderately dilated to 591m Iris atrophy from 0100 to 0230, peripheral irodotimy at 0200   Lens 3-piece PC IOL in good position PC IOL in good position   Anterior Vitreous Vitreous syneresis, Posterior vitreous detachment, Weiss ring, vitreous condensations Vitreous syneresis, PVD, vitreous condensations         Fundus Exam       Right Left   Disc 360 Peripapillary atrophy, diffuse mild pallor, compact, sharp rim mild pallor, Peripapillary atrophy and pigmentation   C/D Ratio 0.1 0.2   Macula Blunted foveal reflex, +PED, RPE mottling, clumping and early atrophy, Drusen, No heme or edema Blunted foveal reflex, low lying PED / +CNVM with stable improvement in IRF, shallow SRF -- improved, RPE mottling and clumping, Drusen, No heme, early atrophy   Vessels Attenuated, Tortuous Attenuated, Tortuous   Periphery Attached, 360 Reticular degeneration Attached, 360 Reticular degeneration, No heme           Refraction     Wearing Rx       Sphere Cylinder Axis   Right -1.50 +0.75 180   Left -0.50 +0.75 180           IMAGING AND PROCEDURES  Imaging and Procedures for '@TODAY'$ @  OCT, Retina - OU - Both Eyes       Right Eye Quality was good. Central Foveal Thickness: 223. Progression has improved. Findings include normal foveal contour, no IRF, no SRF, retinal drusen ,  intraretinal hyper-reflective material, pigment epithelial detachment, outer retinal atrophy (Interval improvement in central PEDs and IRHM).   Left Eye Quality was good. Central Foveal Thickness: 256. Progression has improved. Findings include normal foveal contour, no IRF, retinal drusen , epiretinal membrane, pigment epithelial detachment, subretinal fluid, outer retinal atrophy (Interval improvement in SRF  overlying stable low PED; partial PVD).   Notes *Images captured and stored on drive  Diagnosis / Impression:  OD: Interval improvement in central PEDs and IRHM OS: Exudative ARMD -- Interval improvement in SRF overlying stable low PED; partial PVD  Clinical management:  See below  Abbreviations: NFP - Normal foveal profile. CME - cystoid macular edema. PED - pigment epithelial detachment. IRF - intraretinal fluid. SRF - subretinal fluid. EZ - ellipsoid zone. ERM - epiretinal membrane. ORA - outer retinal atrophy. ORT - outer retinal tubulation. SRHM - subretinal hyper-reflective material       Intravitreal Injection, Pharmacologic Agent - OS - Left Eye       Time Out 12/01/2021. 3:08 PM. Confirmed correct patient, procedure, site, and patient consented.   Anesthesia Topical anesthesia was used. Anesthetic medications included Lidocaine 2%, Proparacaine 0.5%.   Procedure Preparation included 5% betadine to ocular surface, eyelid speculum. A (32g) needle was used.   Injection: 1.25 mg Bevacizumab 1.'25mg'$ /0.51m   Route: Intravitreal, Site: Left Eye   NDC: 5H061816 Lot:: [id number] Expiration date: 12/24/2021   Post-op Post injection exam found visual acuity of at least counting fingers. The patient tolerated the procedure well. There were no complications. The patient received written and verbal post procedure care education.            ASSESSMENT/PLAN:    ICD-10-CM   1. Exudative age-related macular degeneration of left eye with active choroidal neovascularization (HCC)  H35.3221 OCT, Retina - OU - Both Eyes    Intravitreal Injection, Pharmacologic Agent - OS - Left Eye    Bevacizumab (AVASTIN) SOLN 1.25 mg    2. Intermediate stage nonexudative age-related macular degeneration of right eye  H35.3112     3. Diabetes mellitus type 2 without retinopathy ([place]  E11.9     4. Essential hypertension  I10     5. Hypertensive retinopathy of both eyes  H35.033     6.  Pseudophakia of both eyes  Z96.1      1. Exudative age-related macular degeneration, left eye  - conversion of OS from nonexudative to exudative ARMD prior to 02.05.20 visit  - s/p IVA OS #1 (02.05.20), #2 (03.04.20), #3 (04.30.20), #4 (06.02.20), #5 (07.14.20), #6 (9.1.20), #7 (10.27.20), #8 (12.23.20), #9 (02.03.21), #10 (03.17.21), #11 (04.28.21), #12 (06.09.21), #13 (08.04.21), #14 (09.29.21), #15 (11.22.2021), #16 (01.26.22), #17 (04.06.22), #18 (06.28.22), #19 (9.13.22), #20 (11.29.22), #21 (01.31.23), #22 (04.04.23), #23 (05.30.23), #24 (07.27.23), #25 (09.19.23)  **history of recurrent / worsening SRF at 9 wk interval, noted on 04.04.23 visit, at 8 week interval on 09.19.23**  - OCT shows OS: interval improvement in SRF overlying stable low PED; partial PVD at 7 wks  - BCVA stable at 20/40   - recommend IVA OS #26 today, 11.07.23 w/ f/u in 7-8 wks  - pt wishes to be treated with IVA OS - RBA of procedure discussed, questions answered  - see procedure note  - Avastin informed consent obtained, re-signed and scanned 05.30.23  - f/u in 7 wks for DFE, OCT, likely injection OS   2. Age related macular degeneration, non-exudative, right eye  - stable, former pt of Dr. MZigmund Daniel-- no significant  change in OCT or exam  - The incidence, anatomy, and pathology of dry AMD, risk of progression, and the AREDS and AREDS 2 study including smoking risks discussed with patient.  - recommend amsler grid monitoring  3. Diabetes mellitus, type 2 without retinopathy  - The incidence, risk factors for progression, natural history and treatment options for diabetic retinopathy  were discussed with patient.    - The need for close monitoring of blood glucose, blood pressure, and serum lipids, avoiding cigarette or any type of tobacco, and the need for long term follow up was also discussed with patient.  - f/u in 1 year  4,5. Hypertensive retinopathy OU  - discussed importance of tight BP control  -  monitor  6. Pseudophakia OU  - s/p CE/IOL OU  - beautiful surgeries, doing well  - monitor  Ophthalmic Meds Ordered this visit:  Meds ordered this encounter  Medications   Bevacizumab (AVASTIN) SOLN 1.25 mg    Return in about 7 weeks (around 01/19/2022) for f/u exu ARMD OS, DFE, OCT.  There are no Patient Instructions on file for this visit.   This document serves as a record of services personally performed by Gardiner Sleeper, MD, PhD. It was created on their behalf by Renaldo Reel, [place] an ophthalmic technician. The creation of this record is the provider's dictation and/or activities during the visit.    Electronically signed by:  Renaldo Reel, COT  10.24.23  11:14 PM  This document serves as a record of services personally performed by Gardiner Sleeper, MD, PhD. It was created on their behalf by San Jetty. Owens Shark, OA an ophthalmic technician. The creation of this record is the provider's dictation and/or activities during the visit.    Electronically signed by: San Jetty. Owens Shark, [place] 11.07.2023 11:14 PM  Gardiner Sleeper, M.D., Ph.D. Diseases & Surgery of the Retina and Vitreous [facility]  I have reviewed the above documentation for accuracy and completeness, and I agree with the above. Gardiner Sleeper, M.D., Ph.D. 12/01/21 11:16 PM  Abbreviations: M myopia (nearsighted); A astigmatism; H hyperopia (farsighted); P presbyopia; Mrx spectacle prescription;  CTL contact lenses; OD right eye; OS left eye; OU both eyes  XT exotropia; ET esotropia; PEK punctate epithelial keratitis; PEE punctate epithelial erosions; DES dry eye syndrome; MGD meibomian gland dysfunction; ATs artificial tears; PFAT's preservative free artificial tears; [place] nuclear sclerotic cataract; PSC posterior subcapsular cataract; ERM epi-retinal membrane; PVD posterior vitreous detachment; RD retinal detachment; DM diabetes mellitus; DR diabetic retinopathy; NPDR non-proliferative  diabetic retinopathy; PDR proliferative diabetic retinopathy; CSME clinically significant macular edema; DME diabetic macular edema; dbh dot blot hemorrhages; CWS cotton wool spot; POAG primary open angle glaucoma; C/D cup-to-disc ratio; HVF humphrey visual field; GVF goldmann visual field; OCT optical coherence tomography; IOP intraocular pressure; BRVO Branch retinal vein occlusion; CRVO central retinal vein occlusion; CRAO central retinal artery occlusion; BRAO branch retinal artery occlusion; RT retinal tear; SB scleral buckle; PPV pars plana vitrectomy; VH Vitreous hemorrhage; PRP panretinal laser photocoagulation; IVK intravitreal kenalog; VMT vitreomacular traction; MH Macular hole;  NVD neovascularization of the disc; NVE neovascularization elsewhere; AREDS age related eye disease study; ARMD age related macular degeneration; POAG primary open angle glaucoma; EBMD epithelial/anterior basement membrane dystrophy; ACIOL anterior chamber intraocular lens; IOL intraocular lens; PCIOL posterior chamber intraocular lens; Phaco/IOL phacoemulsification with intraocular lens placement; [place] photorefractive keratectomy; LASIK laser assisted in situ keratomileusis; HTN hypertension; DM diabetes mellitus; COPD chronic obstructive pulmonary disease

## 2021-11-18 ENCOUNTER — Emergency Department (HOSPITAL_BASED_OUTPATIENT_CLINIC_OR_DEPARTMENT_OTHER): Payer: Medicare Other

## 2021-11-18 ENCOUNTER — Emergency Department (HOSPITAL_BASED_OUTPATIENT_CLINIC_OR_DEPARTMENT_OTHER)
Admission: EM | Admit: 2021-11-18 | Discharge: 2021-11-18 | Disposition: A | Discharge status: Home / Self Care | Payer: Medicare Other | Attending: Emergency Medicine | Admitting: Emergency Medicine

## 2021-11-18 ENCOUNTER — Encounter (HOSPITAL_BASED_OUTPATIENT_CLINIC_OR_DEPARTMENT_OTHER): Payer: Self-pay | Admitting: Urology

## 2021-11-18 DIAGNOSIS — I6523 Occlusion and stenosis of bilateral carotid arteries: Secondary | ICD-10-CM | POA: Diagnosis not present

## 2021-11-18 DIAGNOSIS — Z794 Long term (current) use of insulin: Secondary | ICD-10-CM | POA: Diagnosis not present

## 2021-11-18 DIAGNOSIS — W010XXA Fall on same level from slipping, tripping and stumbling without subsequent striking against object, initial encounter: Secondary | ICD-10-CM | POA: Insufficient documentation

## 2021-11-18 DIAGNOSIS — D329 Benign neoplasm of meninges, unspecified: Secondary | ICD-10-CM | POA: Diagnosis not present

## 2021-11-18 DIAGNOSIS — M189 Osteoarthritis of first carpometacarpal joint, unspecified: Secondary | ICD-10-CM | POA: Diagnosis not present

## 2021-11-18 DIAGNOSIS — S8001XA Contusion of right knee, initial encounter: Secondary | ICD-10-CM | POA: Insufficient documentation

## 2021-11-18 DIAGNOSIS — Y9301 Activity, walking, marching and hiking: Secondary | ICD-10-CM | POA: Insufficient documentation

## 2021-11-18 DIAGNOSIS — S6991XA Unspecified injury of right wrist, hand and finger(s), initial encounter: Secondary | ICD-10-CM | POA: Diagnosis not present

## 2021-11-18 DIAGNOSIS — Z043 Encounter for examination and observation following other accident: Secondary | ICD-10-CM | POA: Diagnosis not present

## 2021-11-18 DIAGNOSIS — S0990XA Unspecified injury of head, initial encounter: Secondary | ICD-10-CM | POA: Insufficient documentation

## 2021-11-18 DIAGNOSIS — Z79899 Other long term (current) drug therapy: Secondary | ICD-10-CM | POA: Insufficient documentation

## 2021-11-18 DIAGNOSIS — W19XXXA Unspecified fall, initial encounter: Secondary | ICD-10-CM

## 2021-11-18 DIAGNOSIS — S60011A Contusion of right thumb without damage to nail, initial encounter: Secondary | ICD-10-CM | POA: Diagnosis not present

## 2021-11-18 DIAGNOSIS — S8991XA Unspecified injury of right lower leg, initial encounter: Secondary | ICD-10-CM | POA: Diagnosis not present

## 2021-11-18 NOTE — Discharge Instructions (Signed)
You were seen in the emergency department for evaluation of injuries from a fall.  You had a CAT scan of your head along with x-rays of your right thumb and right knee that did not show any obvious fracture or dislocation.  Please use ice and elevation of the affected areas.  Splint and Ace wrap for comfort.  Tylenol and ibuprofen as needed for pain.  Follow-up with your regular doctor and return to the emergency department if any worsening or concerning symptoms.

## 2021-11-18 NOTE — ED Triage Notes (Signed)
Pt states Mechanical fall in doorway this am at [facility]  Pt reports right knee injury, bruising and small abrasion noted  Also has right thumb injury, bruising noted  Denies Blood thinners  Denies head injury or LOC

## 2021-11-18 NOTE — ED Provider Notes (Signed)
[facility] EMERGENCY DEPARTMENT Provider Note   CSN: [id number] Arrival date & time: 11/18/21  1443     History {Add pertinent medical, surgical, social history, OB history to HPI:1} Chief Complaint  Patient presents with   Sue [name] is a 88 y.o. female.  She is here for evaluation of injuries from a fall just prior to arrival.  She said she was walking through the door at [employer] when it closed on her and she fell to the ground.  She thinks she might of hit her head although denies any loss of consciousness.  Complaining of pain to her right thumb and her right knee.  She is ambulated since the fall.  She is not on any blood thinners.  She denies any neck or back pain.  No numbness or weakness.  No chest or abdominal pain.  The history is provided by the patient.  Fall This is a new problem. The current episode started 1 to 2 hours ago. The problem has not changed since onset.Pertinent negatives include no chest pain, no abdominal pain, no headaches and no shortness of breath. Associated symptoms comments: Right thumb right knee. The symptoms are aggravated by bending and twisting. Nothing relieves the symptoms. She has tried rest and a cold compress for the symptoms. The treatment provided mild relief.       Home Medications Prior to Admission medications   Medication Sig Start Date End Date Taking? Authorizing Provider  amLODipine (NORVASC) 10 MG tablet Take 1 tablet by mouth daily.    [provider]  B-D ULTRAFINE III SHORT PEN 31G X 8 MM MISC USE AS DIRECTED ONCE DAILY SUBCUTANEOUSLY 09/14/17   [provider]  Calcium Citrate-Vitamin D (CALCIUM + D PO)     [provider]  calcium-vitamin D (OSCAL WITH D) 500-200 MG-UNIT tablet Take 1 tablet by mouth 2 (two) times daily.     [provider]  Continuous Blood Gluc Sensor (FREESTYLE LIBRE 2 SENSOR) MISC See admin instructions. 12/12/20   [provider]   Cyanocobalamin (B-12 PO)     [provider]  diphenhydramine-acetaminophen (TYLENOL PM) 25-500 MG TABS tablet Take 1 tablet by mouth at bedtime as needed (sleep).    [provider]  Glucagon (GVOKE HYPOPEN 2-PACK) 0.5 MG/0.1ML SOAJ as needed for hypoglycemia 10/13/20   [provider]  glucose blood test strip OneTouch Ultra Test strips  TEST ONCE A DAY ONCE A DAY FINGERSTICK 90    [provider]  hydrALAZINE (APRESOLINE) 25 MG tablet Take 3 tablets (75 mg total) by mouth every 8 (eight) hours. 12/23/18   Allie Bossier, MD  hydrochlorothiazide (HYDRODIURIL) 25 MG tablet Take 25 mg by mouth daily.    [provider]  ibuprofen (ADVIL) 200 MG tablet Take 400 mg by mouth every 6 (six) hours as needed for moderate pain.    [provider]  Insulin Degludec (TRESIBA FLEXTOUCH) 200 UNIT/ML SOPN Inject 36 Units into the skin at bedtime. Patient taking differently: Inject 22 Units into the skin at bedtime. 12/23/18   Allie Bossier, MD  Insulin Pen Needle (PEN NEEDLES) 32G X 4 MM MISC BD Ultra-Fine Nano Pen Needle 32 gauge x 5/32"  USE AS DIRECTED DAILY    [provider]  ketoconazole (NIZORAL) 2 % cream Apply topically. 12/16/19   [provider]  Lancets (ONETOUCH ULTRASOFT) lancets OneTouch UltraSoft Lancets  USE TWICE DAILY    [provider]  Melatonin 10 MG CAPS Take 10 mg by mouth at bedtime as needed (sleep).    [provider]  meloxicam (MOBIC) 7.5 MG tablet Take 1 tablet (7.5 mg total) by mouth daily as needed for pain. 02/15/19   Hilts, Legrand Como, MD  Multiple Vitamins-Minerals (ADULT ONE DAILY GUMMIES) CHEW Chew 1 tablet by mouth 2 (two) times daily. Centrum    [provider]  Multiple Vitamins-Minerals (AIRBORNE) TBEF Take 1 tablet by mouth daily as needed (immune support).    [provider]  Multiple Vitamins-Minerals (CENTRUM SILVER PO)     [provider]  Multiple  Vitamins-Minerals (PRESERVISION AREDS 2) CAPS Take 1 capsule by mouth 2 (two) times daily.    [provider]  mupirocin ointment (BACTROBAN) 2 % Apply to right foot ulcer once daily. Patient taking differently: Apply 1 application  topically daily as needed (wound care). Apply to right foot ulcer once daily. 06/26/19   Marzetta Board, DPM  nortriptyline (PAMELOR) 25 MG capsule Take 1 capsule (25 mg total) by mouth at bedtime. Patient taking differently: Take 25 mg by mouth 2 (two) times daily. 12/23/18   Allie Bossier, MD  Omega-3 Fatty Acids (FISH OIL) 1200 MG CAPS Take 1,200 mg by mouth daily with lunch.     [provider]  omeprazole (PRILOSEC) 20 MG capsule Take 1 capsule by mouth daily.    [provider]  ondansetron (ZOFRAN) 4 MG tablet Take 4 mg by mouth 3 (three) times daily as needed. 03/27/21   [provider]  ONE TOUCH ULTRA TEST test strip  12/07/17   [provider]  Polyethyl Glycol-Propyl Glycol (SYSTANE) 0.4-0.3 % SOLN Place 1 drop into both eyes 3 (three) times daily as needed (dry eyes).    [provider]  PRESCRIPTION MEDICATION by Intravitreal route every 30 (thirty) days. Ophthalmic injection at MD office in left eye    [provider]  RESTASIS 0.05 % ophthalmic emulsion Place 1 drop into both eyes 2 (two) times daily. 04/03/19   [provider]  silver sulfADIAZINE (SILVADENE) 1 % cream Apply pea-sized amount to wound daily. Patient taking differently: Apply 1 application  topically daily. Apply pea-sized amount to wound daily. 09/04/19   Evelina Bucy, DPM  simvastatin (ZOCOR) 20 MG tablet Take 1 tablet by mouth daily.    [provider]  traMADol (ULTRAM) 50 MG tablet Take 1 tablet (50 mg total) by mouth 2 (two) times daily as needed. 02/15/19   Hilts, Legrand Como, MD  vitamin B-12 (CYANOCOBALAMIN) 1000 MCG tablet Take 1,000 mcg by mouth 2 (two) times daily.     [provider]       Allergies    Patient has no known allergies.    Review of Systems   Review of Systems  Constitutional:  Negative for fever.  Eyes:  Negative for visual disturbance.  Respiratory:  Negative for shortness of breath.   Cardiovascular:  Negative for chest pain.  Gastrointestinal:  Negative for abdominal pain.  Musculoskeletal:  Negative for back pain and neck pain.  Skin:  Positive for wound.  Neurological:  Negative for headaches.    Physical Exam Updated Vital Signs BP (!) 157/72 (BP Location: Left Arm)   Pulse 97   Temp 97.8 F (36.6 C)   Resp 20   Ht '5\' 6"'$  (1.676 m)   Wt 77.1 kg   SpO2 96%   BMI 27.44 kg/m  Physical Exam Vitals and nursing note reviewed.  Constitutional:      General: She is not in acute distress.    Appearance: Normal appearance. She is well-developed.  HENT:     Head: Normocephalic and atraumatic.  Eyes:     Conjunctiva/sclera: Conjunctivae normal.  Cardiovascular:     Rate and Rhythm: Normal rate and regular rhythm.     Heart sounds: No murmur heard. Pulmonary:     Effort: Pulmonary effort is normal. No respiratory distress.     Breath sounds: Normal breath sounds.  Abdominal:     Palpations: Abdomen is soft.     Tenderness: There is no abdominal tenderness. There is no guarding or rebound.  Musculoskeletal:        General: Swelling, tenderness and signs of injury present. Normal range of motion.     Cervical back: Neck supple. No tenderness.     Comments: Right hand she has marked ecchymosis of her right thumb especially at her IP.  Nail intact.  Rest of her hand wrist elbow shoulder nontender.  Distal pulses motor and sensation intact.  Right lower extremity she has marked swelling and bruising over her right knee with a superficial abrasion.  Hip and ankle nontender.  Distal pulses motor sensation intact.  Left upper and lower extremity full range of motion without any pain or limitations.  Neck and back nontender.  Skin:    General: Skin  is warm and dry.     Capillary Refill: Capillary refill takes less than 2 seconds.  Neurological:     General: No focal deficit present.     Mental Status: She is alert.     Sensory: No sensory deficit.     Motor: No weakness.     ED Results / Procedures / Treatments   Labs (all labs ordered are listed, but only abnormal results are displayed) Labs Reviewed - No data to display  EKG None  Radiology No results found.  Procedures Procedures  {Document cardiac monitor, telemetry assessment procedure when appropriate:1}  Medications Ordered in ED Medications - No data to display  ED Course/ Medical Decision Making/ A&P                           Medical Decision Making Amount and/or Complexity of Data Reviewed Radiology: ordered.   This patient complains of ***; this involves an extensive number of treatment Options and is a complaint that carries with it a high risk of complications and morbidity. The differential includes ***  I ordered, reviewed and interpreted labs, which included *** I ordered medication *** and reviewed PMP when indicated. I ordered imaging studies which included *** and I independently    visualized and interpreted imaging which showed *** Additional history obtained from *** Previous records obtained and reviewed *** I consulted *** and discussed lab and imaging findings and discussed disposition.  Cardiac monitoring reviewed, *** Social determinants considered, *** Critical Interventions: ***  After the interventions stated above, I reevaluated the patient and found *** Admission and further testing considered, ***   {Document critical care time when appropriate:1} {Document review of labs and clinical decision tools ie heart score, Chads2Vasc2 etc:1}  {Document your independent review of radiology images, and any outside records:1} {Document your discussion with family members, caretakers, and with consultants:1} {Document social  determinants of health affecting pt's care:1} {Document your decision making why or why not admission, treatments were needed:1} Final Clinical Impression(s) / ED Diagnoses Final diagnoses:  None  Rx / DC Orders ED Discharge Orders     None

## 2021-12-01 ENCOUNTER — Encounter (INDEPENDENT_AMBULATORY_CARE_PROVIDER_SITE_OTHER): Payer: Self-pay | Admitting: Ophthalmology

## 2021-12-01 ENCOUNTER — Ambulatory Visit (INDEPENDENT_AMBULATORY_CARE_PROVIDER_SITE_OTHER): Payer: Medicare Other | Admitting: Ophthalmology

## 2021-12-01 DIAGNOSIS — I10 Essential (primary) hypertension: Secondary | ICD-10-CM

## 2021-12-01 DIAGNOSIS — H35033 Hypertensive retinopathy, bilateral: Secondary | ICD-10-CM

## 2021-12-01 DIAGNOSIS — H353112 Nonexudative age-related macular degeneration, right eye, intermediate dry stage: Secondary | ICD-10-CM

## 2021-12-01 DIAGNOSIS — E119 Type 2 diabetes mellitus without complications: Secondary | ICD-10-CM

## 2021-12-01 DIAGNOSIS — Z961 Presence of intraocular lens: Secondary | ICD-10-CM | POA: Diagnosis not present

## 2021-12-01 DIAGNOSIS — H353221 Exudative age-related macular degeneration, left eye, with active choroidal neovascularization: Secondary | ICD-10-CM | POA: Diagnosis not present

## 2021-12-01 DIAGNOSIS — L0390 Cellulitis, unspecified: Secondary | ICD-10-CM | POA: Diagnosis not present

## 2021-12-01 MED ORDER — BEVACIZUMAB CHEMO INJECTION 1.25MG/0.05ML SYRINGE FOR KALEIDOSCOPE
1.2500 mg | INTRAVITREAL | Status: AC | PRN
  Administered 2021-12-01: 1.25 mg via INTRAVITREAL

## 2022-01-21 NOTE — Progress Notes (Signed)
[facility] Note  01/22/2022   CHIEF COMPLAINT Patient presents for Retina Follow Up  HISTORY OF PRESENT ILLNESS: Sue [name] is a 88 y.o. female who presents to the clinic today for:  HPI     Retina Follow Up   Patient presents with  Wet AMD.  In left eye.  This started 7 weeks ago.  I, the attending physician,  performed the HPI with the patient and updated documentation appropriately.        Comments   Patient here for 7 weeks retina follow up for exu ARMD OS. Patient states vision doing pretty good. No eye pain. Uses Restasis BID OU and AT QD OU. Has new RX for glasses not filled yet.      Last edited by Bernarda Caffey, MD on 01/22/2022 12:21 PM.     Referring physician: Merrilee Seashore, [employer] [place],  [place] [address]  HISTORICAL INFORMATION:   Selected notes from the MEDICAL RECORD NUMBER Referred by Dr. Quentin Ore for concern of ARMD OU   CURRENT MEDICATIONS: Current Outpatient Medications (Ophthalmic Drugs)  Medication Sig   RESTASIS 0.05 % ophthalmic emulsion Place 1 drop into both eyes 2 (two) times daily.   Polyethyl Glycol-Propyl Glycol (SYSTANE) 0.4-0.3 % SOLN Place 1 drop into both eyes 3 (three) times daily as needed (dry eyes). (Patient not taking: Reported on 01/22/2022)   No current facility-administered medications for this visit. (Ophthalmic Drugs)   Current Outpatient Medications (Other)  Medication Sig   amLODipine (NORVASC) 10 MG tablet Take 1 tablet by mouth daily.   B-D ULTRAFINE III SHORT PEN 31G X 8 MM MISC USE AS DIRECTED ONCE DAILY SUBCUTANEOUSLY   Calcium Citrate-Vitamin D (CALCIUM + D PO)    calcium-vitamin D (OSCAL WITH D) 500-200 MG-UNIT tablet Take 1 tablet by mouth 2 (two) times daily.    Continuous Blood Gluc Sensor (FREESTYLE LIBRE 2 SENSOR) MISC See admin instructions.   Cyanocobalamin (B-12 PO)    diphenhydramine-acetaminophen (TYLENOL PM) 25-500 MG TABS tablet Take  1 tablet by mouth at bedtime as needed (sleep).   Glucagon (GVOKE HYPOPEN 2-PACK) 0.5 MG/0.1ML SOAJ as needed for hypoglycemia   glucose blood test strip OneTouch Ultra Test strips  TEST ONCE A DAY ONCE A DAY FINGERSTICK 90   hydrALAZINE (APRESOLINE) 25 MG tablet Take 3 tablets (75 mg total) by mouth every 8 (eight) hours.   hydrochlorothiazide (HYDRODIURIL) 25 MG tablet Take 25 mg by mouth daily.   ibuprofen (ADVIL) 200 MG tablet Take 400 mg by mouth every 6 (six) hours as needed for moderate pain.   Insulin Degludec (TRESIBA FLEXTOUCH) 200 UNIT/ML SOPN Inject 36 Units into the skin at bedtime. (Patient taking differently: Inject 22 Units into the skin at bedtime.)   Insulin Pen Needle (PEN NEEDLES) 32G X 4 MM MISC BD Ultra-Fine Nano Pen Needle 32 gauge x 5/32"  USE AS DIRECTED DAILY   ketoconazole (NIZORAL) 2 % cream Apply topically.   Lancets (ONETOUCH ULTRASOFT) lancets OneTouch UltraSoft Lancets  USE TWICE DAILY   Melatonin 10 MG CAPS Take 10 mg by mouth at bedtime as needed (sleep).   meloxicam (MOBIC) 7.5 MG tablet Take 1 tablet (7.5 mg total) by mouth daily as needed for pain.   Multiple Vitamins-Minerals (ADULT ONE DAILY GUMMIES) CHEW Chew 1 tablet by mouth 2 (two) times daily. Centrum   Multiple Vitamins-Minerals (AIRBORNE) TBEF Take 1 tablet by mouth daily as needed (immune support).  Multiple Vitamins-Minerals (CENTRUM SILVER PO)    Multiple Vitamins-Minerals (PRESERVISION AREDS 2) CAPS Take 1 capsule by mouth 2 (two) times daily.   mupirocin ointment (BACTROBAN) 2 % Apply to right foot ulcer once daily. (Patient taking differently: Apply 1 application  topically daily as needed (wound care). Apply to right foot ulcer once daily.)   nortriptyline (PAMELOR) 25 MG capsule Take 1 capsule (25 mg total) by mouth at bedtime. (Patient taking differently: Take 25 mg by mouth 2 (two) times daily.)   Omega-3 Fatty Acids (FISH OIL) 1200 MG CAPS Take 1,200 mg by mouth daily with lunch.     omeprazole (PRILOSEC) 20 MG capsule Take 1 capsule by mouth daily.   ondansetron (ZOFRAN) 4 MG tablet Take 4 mg by mouth 3 (three) times daily as needed.   ONE TOUCH ULTRA TEST test strip    PRESCRIPTION MEDICATION by Intravitreal route every 30 (thirty) days. Ophthalmic injection at MD office in left eye   silver sulfADIAZINE (SILVADENE) 1 % cream Apply pea-sized amount to wound daily. (Patient taking differently: Apply 1 application  topically daily. Apply pea-sized amount to wound daily.)   simvastatin (ZOCOR) 20 MG tablet Take 1 tablet by mouth daily.   traMADol (ULTRAM) 50 MG tablet Take 1 tablet (50 mg total) by mouth 2 (two) times daily as needed.   vitamin B-12 (CYANOCOBALAMIN) 1000 MCG tablet Take 1,000 mcg by mouth 2 (two) times daily.    Current Facility-Administered Medications (Other)  Medication Route   Bevacizumab (AVASTIN) SOLN 1.25 mg Intravitreal   Bevacizumab (AVASTIN) SOLN 1.25 mg Intravitreal   REVIEW OF SYSTEMS: ROS   Positive for: Neurological, Skin, Genitourinary, Musculoskeletal, Endocrine, Eyes Negative for: Constitutional, Gastrointestinal, HENT, Cardiovascular, Respiratory, Psychiatric, Allergic/Imm, Heme/Lymph Last edited by Theodore Demark, COA on 01/22/2022  8:42 AM.     ALLERGIES No Known Allergies  PAST MEDICAL HISTORY Past Medical History:  Diagnosis Date   Anemia    Arthritis    Basal cell carcinoma 11/23/2017   left elbow, right neck TX CX3 5FU    Basal cell carcinoma 04/04/2019   infil-right sideburn (CX35FU)   Chronic kidney disease    stage 3 ckd stage 3 b per dr Ashby Dawes 09-26-2019 note on chart   Complication of anesthesia    deifficulty waking up   COVID-19    Diabetic neuropathy ([place])    Hyperlipidemia    Hypertension    Hypertensive retinopathy    OU   Infiltrative basal cell carcinoma (BCC) 04/22/2020   Right Zygomatic Area    Macular degeneration    OU   Nodular basal cell carcinoma (BCC) 04/22/2020   Right Anterior  Neck   Pneumonia    Right carotid bruit per dr Ashby Dawes 09-26-2019 note on chart   SCC (squamous cell carcinoma) 11/23/2017   right forehead TX CX3 5FU   SCCA (squamous cell carcinoma) of skin 04/22/2020   Mid Parietal Scalp (in situ)   SCCA (squamous cell carcinoma) of skin 04/22/2020   Right Dorsal Hand (in situ)   Type 2 DM with CKD stage 3 and hypertension ([place])    Past Surgical History:  Procedure Laterality Date   ABDOMINAL HYSTERECTOMY     AMPUTATION TOE Right 09/28/2019   Procedure: Right 2nd toe amputation - partial;  Surgeon: Evelina Bucy, DPM;  Location: [facility];  Service: Podiatry;  Laterality: Right;   CATARACT EXTRACTION     CHOLECYSTECTOMY     EYE SURGERY     HERNIA REPAIR  SPINE SURGERY     TONSILLECTOMY     FAMILY HISTORY History reviewed. No pertinent family history.  SOCIAL HISTORY Social History   Tobacco Use   Smoking status: Former   Smokeless tobacco: Never  Scientific laboratory technician Use: Never used  Substance Use Topics   Alcohol use: No    Alcohol/week: 0.0 standard drinks of alcohol   Drug use: No       OPHTHALMIC EXAM:  Base Eye Exam     Visual Acuity (Snellen - Linear)       Right Left   Dist cc 20/40 -2 20/40 -2   Dist ph cc NI 20/40 +2    Correction: Glasses         Tonometry (Tonopen, 8:39 AM)       Right Left   Pressure 15 13         Pupils       Dark Light Shape React APD   Right 4 3 Round Brisk None   Left 4 3 Round Brisk None         Visual Fields (Counting fingers)       Left Right    Full Full         Extraocular Movement       Right Left    Full, Ortho Full, Ortho         Neuro/Psych     Oriented x3: Yes   Mood/Affect: Normal         Dilation     Both eyes: 1.0% Mydriacyl, 2.5% Phenylephrine @ 8:39 AM           Slit Lamp and Fundus Exam     Slit Lamp Exam       Right Left   Lids/Lashes Dermatochalasis - upper lid, Telangiectasia, mild MGD  Dermatochalasis - upper lid, Telangiectasia, Meibomian gland dysfunction   Conjunctiva/Sclera White and quiet White and quiet   Cornea Temporal Well healed cataract wounds, mild Arcus, trace endo pigment nasally, 1+PEE Arcus, Temporal Well healed cataract wounds, 1-2+ PEE   Anterior Chamber Deep and quiet Deep and quiet   Iris Round and moderately dilated to 4.65m Round and moderately dilated to 555m Iris atrophy from 0100 to 0230, peripheral irodotimy at 0200   Lens 3-piece PC IOL in good position PC IOL in good position   Anterior Vitreous Vitreous syneresis, Posterior vitreous detachment, Weiss ring, vitreous condensations Vitreous syneresis, PVD, vitreous condensations         Fundus Exam       Right Left   Disc 360 Peripapillary atrophy, diffuse mild pallor, compact, sharp rim mild pallor, Peripapillary atrophy and pigmentation   C/D Ratio 0.1 0.2   Macula Blunted foveal reflex, +PED -- collapsing, RPE mottling, clumping and early atrophy, Drusen, No heme or edema Blunted foveal reflex, low lying PED / +CNVM with stable improvement in IRF and SRF, RPE mottling and clumping, Drusen, No heme, early atrophy   Vessels Attenuated, Tortuous Attenuated, Tortuous   Periphery Attached, 360 Reticular degeneration Attached, 360 Reticular degeneration, No heme           Refraction     Wearing Rx       Sphere Cylinder Axis   Right -1.50 +0.75 180   Left -0.50 +0.75 180           IMAGING AND PROCEDURES  Imaging and Procedures for '@TODAY'$ @  OCT, Retina - OU - Both Eyes       Right Eye  Quality was good. Central Foveal Thickness: 217. Progression has improved. Findings include normal foveal contour, no IRF, no SRF, retinal drusen , subretinal hyper-reflective material, intraretinal hyper-reflective material, pigment epithelial detachment, outer retinal atrophy (Interval improvement in central PED).   Left Eye Quality was good. Central Foveal Thickness: 218. Progression has been  stable. Findings include normal foveal contour, no IRF, retinal drusen , epiretinal membrane, pigment epithelial detachment, subretinal fluid, outer retinal atrophy (stable improvement in SRF overlying stable low PED; partial PVD).   Notes *Images captured and stored on drive  Diagnosis / Impression:  OD: Interval improvement in central PED OS: Exudative ARMD -- stable improvement in SRF overlying stable low PED; partial PVD  Clinical management:  See below  Abbreviations: NFP - Normal foveal profile. CME - cystoid macular edema. PED - pigment epithelial detachment. IRF - intraretinal fluid. SRF - subretinal fluid. EZ - ellipsoid zone. ERM - epiretinal membrane. ORA - outer retinal atrophy. ORT - outer retinal tubulation. SRHM - subretinal hyper-reflective material       Intravitreal Injection, Pharmacologic Agent - OS - Left Eye       Time Out 01/22/2022. 9:01 AM. Confirmed correct patient, procedure, site, and patient consented.   Anesthesia Topical anesthesia was used. Anesthetic medications included Lidocaine 2%, Proparacaine 0.5%.   Procedure Preparation included 5% betadine to ocular surface, eyelid speculum. A supplied (32g) needle was used.   Injection: 1.25 mg Bevacizumab 1.'25mg'$ /0.29m   Route: Intravitreal, Site: Left Eye   NDC: [id number] Lot: [id number]'@4'$ , Expiration date: 02/01/2022   Post-op Post injection exam found visual acuity of at least counting fingers. The patient tolerated the procedure well. There were no complications. The patient received written and verbal post procedure care education.            ASSESSMENT/PLAN:    ICD-10-CM   1. Exudative age-related macular degeneration of left eye with active choroidal neovascularization (HCC)  H35.3221 OCT, Retina - OU - Both Eyes    Intravitreal Injection, Pharmacologic Agent - OS - Left Eye    Bevacizumab (AVASTIN) SOLN 1.25 mg    2. 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 Diabetes mellitus type 2 without retinopathy [place]  E11.9     4. Essential hypertension  I10     5. Hypertensive retinopathy of both eyes  H35.033     6. Pseudophakia of both eyes  Z96.1      1. Exudative age-related macular degeneration, left eye  - conversion of OS from nonexudative to exudative ARMD prior to 02.05.20 visit  - s/p IVA OS #1 (02.05.20), #2 (03.04.20), #3 (04.30.20), #4 (06.02.20), #5 (07.14.20), #6 (9.1.20), #7 (10.27.20), #8 (12.23.20), #9 (02.03.21), #10 (03.17.21), #11 (04.28.21), #12 (06.09.21), #13 (08.04.21), #14 (09.29.21), #15 (11.22.2021), #16 (01.26.22), #17 (04.06.22), #18 (06.28.22), #19 (9.13.22), #20 (11.29.22), #21 (01.31.23), #22 (04.04.23), #23 (05.30.23), #24 (07.27.23), #25 (09.19.23), #26 (11.07.23)  **history of recurrent / worsening SRF at 9 wk interval, noted on 04.04.23 visit, at 8 week interval on 09.19.23**  - OCT shows OS: interval improvement in SRF overlying stable low PED; partial PVD at 7 wks  - BCVA stable at 20/40   - recommend IVA OS #27 today, 12.29.23 w/ f/u in 7 wks  - pt wishes to be treated with IVA OS - RBA of procedure discussed, questions answered  - see procedure note  - Avastin informed consent obtained, re-signed and scanned 05.30.23  - f/u in 7 wks for DFE,  OCT, likely injection OS   2. Age related macular degeneration, non-exudative, right eye  - stable, former pt of Dr. Zigmund Daniel -- no significant change in OCT or exam  - The incidence, anatomy, and pathology of dry AMD, risk of progression, and the AREDS and AREDS 2 study including smoking risks discussed with patient.  - recommend amsler grid monitoring  3. Diabetes mellitus, type 2 without retinopathy  - The incidence, risk factors for progression, natural history and treatment options for diabetic retinopathy  were discussed with patient.    - The need for close monitoring of blood glucose, blood pressure, and serum lipids, avoiding  cigarette or any type of tobacco, and the need for long term follow up was also discussed with patient.  - f/u in 1 year  4,5. Hypertensive retinopathy OU  - discussed importance of tight BP control  - monitor  6. Pseudophakia OU  - s/p CE/IOL OU  - beautiful surgeries, doing well  - monitor  Ophthalmic Meds Ordered this visit:  Meds ordered this encounter  Medications   Bevacizumab (AVASTIN) SOLN 1.25 mg    Return in about 7 weeks (around 03/12/2022) for f/u exu ARMD OS, DFE, OCT.  There are no Patient Instructions on file for this visit.   This document serves as a record of services personally performed by Gardiner Sleeper, MD, PhD. It was created on their behalf by San Jetty. Owens Shark, OA an ophthalmic technician. The creation of this record is the provider's dictation and/or activities during the visit.    Electronically signed by: San Jetty. Marguerita Merles 12.28.2023 12:23 PM  Gardiner Sleeper, M.D., Ph.D. Diseases & Surgery of the Retina and Vitreous [facility]  I have reviewed the above documentation for accuracy and completeness, and I agree with the above. Gardiner Sleeper, M.D., Ph.D. 01/22/22 12:23 PM  Abbreviations: M myopia (nearsighted); A astigmatism; H hyperopia (farsighted); P presbyopia; Mrx spectacle prescription;  CTL contact lenses; OD right eye; OS left eye; OU both eyes  XT exotropia; ET esotropia; PEK punctate epithelial keratitis; PEE punctate epithelial erosions; DES dry eye syndrome; MGD meibomian gland dysfunction; ATs artificial tears; PFAT's preservative free artificial tears; [place] nuclear sclerotic cataract; PSC posterior subcapsular cataract; ERM epi-retinal membrane; PVD posterior vitreous detachment; RD retinal detachment; DM diabetes mellitus; DR diabetic retinopathy; NPDR non-proliferative diabetic retinopathy; PDR proliferative diabetic retinopathy; CSME clinically significant macular edema; DME diabetic macular edema; dbh dot blot  hemorrhages; CWS cotton wool spot; POAG primary open angle glaucoma; C/D cup-to-disc ratio; HVF humphrey visual field; GVF goldmann visual field; OCT optical coherence tomography; IOP intraocular pressure; BRVO Branch retinal vein occlusion; CRVO central retinal vein occlusion; CRAO central retinal artery occlusion; BRAO branch retinal artery occlusion; RT retinal tear; SB scleral buckle; PPV pars plana vitrectomy; VH Vitreous hemorrhage; PRP panretinal laser photocoagulation; IVK intravitreal kenalog; VMT vitreomacular traction; MH Macular hole;  NVD neovascularization of the disc; NVE neovascularization elsewhere; AREDS age related eye disease study; ARMD age related macular degeneration; POAG primary open angle glaucoma; EBMD epithelial/anterior basement membrane dystrophy; ACIOL anterior chamber intraocular lens; IOL intraocular lens; PCIOL posterior chamber intraocular lens; Phaco/IOL phacoemulsification with intraocular lens placement; [place] photorefractive keratectomy; LASIK laser assisted in situ keratomileusis; HTN hypertension; DM diabetes mellitus; COPD chronic obstructive pulmonary disease

## 2022-01-22 ENCOUNTER — Encounter (INDEPENDENT_AMBULATORY_CARE_PROVIDER_SITE_OTHER): Payer: Self-pay | Admitting: Ophthalmology

## 2022-01-22 ENCOUNTER — Encounter (INDEPENDENT_AMBULATORY_CARE_PROVIDER_SITE_OTHER): Payer: Medicare Other | Admitting: Ophthalmology

## 2022-01-22 ENCOUNTER — Ambulatory Visit (INDEPENDENT_AMBULATORY_CARE_PROVIDER_SITE_OTHER): Payer: Medicare Other | Admitting: Ophthalmology

## 2022-01-22 DIAGNOSIS — I10 Essential (primary) hypertension: Secondary | ICD-10-CM | POA: Diagnosis not present

## 2022-01-22 DIAGNOSIS — E119 Type 2 diabetes mellitus without complications: Secondary | ICD-10-CM

## 2022-01-22 DIAGNOSIS — H353221 Exudative age-related macular degeneration, left eye, with active choroidal neovascularization: Secondary | ICD-10-CM | POA: Diagnosis not present

## 2022-01-22 DIAGNOSIS — H35033 Hypertensive retinopathy, bilateral: Secondary | ICD-10-CM | POA: Diagnosis not present

## 2022-01-22 DIAGNOSIS — H353112 Nonexudative age-related macular degeneration, right eye, intermediate dry stage: Secondary | ICD-10-CM

## 2022-01-22 DIAGNOSIS — Z961 Presence of intraocular lens: Secondary | ICD-10-CM

## 2022-01-22 MED ORDER — BEVACIZUMAB CHEMO INJECTION 1.25MG/0.05ML SYRINGE FOR KALEIDOSCOPE
1.2500 mg | INTRAVITREAL | Status: AC | PRN
  Administered 2022-01-22: 1.25 mg via INTRAVITREAL

## 2022-01-27 ENCOUNTER — Ambulatory Visit: Payer: Medicare Other | Admitting: Podiatry

## 2022-01-29 ENCOUNTER — Ambulatory Visit: Payer: Medicare Other | Admitting: Podiatry

## 2022-01-29 ENCOUNTER — Encounter: Payer: Self-pay | Admitting: Podiatry

## 2022-01-29 VITALS — BP 161/70

## 2022-01-29 DIAGNOSIS — M79675 Pain in left toe(s): Secondary | ICD-10-CM | POA: Diagnosis not present

## 2022-01-29 DIAGNOSIS — Z89421 Acquired absence of other right toe(s): Secondary | ICD-10-CM

## 2022-01-29 DIAGNOSIS — E119 Type 2 diabetes mellitus without complications: Secondary | ICD-10-CM | POA: Diagnosis not present

## 2022-01-29 DIAGNOSIS — B351 Tinea unguium: Secondary | ICD-10-CM

## 2022-01-29 DIAGNOSIS — L84 Corns and callosities: Secondary | ICD-10-CM

## 2022-01-29 DIAGNOSIS — E1142 Type 2 diabetes mellitus with diabetic polyneuropathy: Secondary | ICD-10-CM | POA: Diagnosis not present

## 2022-01-29 DIAGNOSIS — M79674 Pain in right toe(s): Secondary | ICD-10-CM

## 2022-02-04 NOTE — Progress Notes (Signed)
ANNUAL DIABETIC FOOT EXAM  Subjective: Sue [name] presents today for annual diabetic foot examination.  Chief Complaint  Patient presents with   Nail Problem    DFC BS-111 A1C-7.? PCP-Ramachandran PCP VST-1 month ago    Patient confirms h/o diabetes since the 1990's.  Patient denies any h/o foot wounds.  Patient has h/o amputation(s): partial amputation of R 2nd toe.  Patient admits symptoms of foot numbness.   Patient admits symptoms of foot tingling.  Patient admits symptoms of burning in feet.  Patient admits symptoms of pins/needles sensation in feet.  Patient denies any numbness, tingling, burning, or pins/needle sensation in feet.  Patient has been diagnosed with neuropathy.  Risk factors: diabetes, diabetic neuropathy, foot deformity, history of foot/leg ulcer, HTN, CKD, hyperlipidemia, h/o tobacco use in remission.  Merrilee Seashore, MD is patient's PCP.   Past Medical History:  Diagnosis Date   Anemia    Arthritis    Basal cell carcinoma 11/23/2017   left elbow, right neck TX CX3 5FU    Basal cell carcinoma 04/04/2019   infil-right sideburn ([id number])   Chronic kidney disease    stage 3 ckd stage 3 b per dr Ashby Dawes 09-26-2019 note on chart   Complication of anesthesia    deifficulty waking up   COVID-19    Diabetic neuropathy ([place])    Hyperlipidemia    Hypertension    Hypertensive retinopathy    OU   Infiltrative basal cell carcinoma (BCC) 04/22/2020   Right Zygomatic Area    Macular degeneration    OU   Nodular basal cell carcinoma (BCC) 04/22/2020   Right Anterior Neck   Pneumonia    Right carotid bruit per dr Ashby Dawes 09-26-2019 note on chart   SCC (squamous cell carcinoma) 11/23/2017   right forehead TX CX3 5FU   SCCA (squamous cell carcinoma) of skin 04/22/2020   Mid Parietal Scalp (in situ)   SCCA (squamous cell carcinoma) of skin 04/22/2020   Right Dorsal Hand (in situ)   Type 2 DM with CKD stage 3 and hypertension ([place])     Patient Active Problem List   Diagnosis Date Noted   Slow transit constipation 05/11/2021   Chronic kidney disease due to hypertension 09/03/2020   Menopause 09/03/2020   Polyneuropathy due to type 2 diabetes mellitus ([place]) 09/03/2020   Primary osteoarthritis 09/03/2020   Recurrent falls 09/03/2020   Closed fracture of distal end of left radius 03/13/2020   Carotid bruit 02/19/2020   Electrocardiogram abnormal 02/19/2020   Foot callus 02/19/2020   Hyperglycemia due to type 2 diabetes mellitus ([place]) 02/19/2020   Iron deficiency anemia secondary to inadequate dietary iron intake 02/19/2020   Adult general medical exam 02/19/2020   Pyogenic inflammation of bone ([place])    Gastroenteritis due to COVID-19 virus 12/21/2018   Acute renal failure superimposed on stage 3b chronic kidney disease ([place]) 12/21/2018   Diabetes mellitus type 2, uncontrolled, with complications 74/08/1446   HLD (hyperlipidemia) 12/20/2018   Anxiety 12/20/2018   Depression 12/20/2018   Acute respiratory disease due to COVID-19 virus 12/17/2018   Dehydration 12/17/2018   Essential hypertension 12/17/2018   Type 1 diabetes mellitus without complication ([place]) 18/56/3149   Diabetic ulcer of foot associated with diabetes mellitus due to underlying condition, limited to breakdown of skin ([place]) 01/11/2018   Right hip pain 04/15/2016   Right buttock pain 04/15/2016   Primary osteoarthritis of right hip 04/15/2016   Past Surgical History:  Procedure Laterality Date   ABDOMINAL HYSTERECTOMY  AMPUTATION TOE Right 09/28/2019   Procedure: Right 2nd toe amputation - partial;  Surgeon: Evelina Bucy, DPM;  Location: [facility];  Service: Podiatry;  Laterality: Right;   CATARACT EXTRACTION     CHOLECYSTECTOMY     EYE SURGERY     HERNIA REPAIR     SPINE SURGERY     TONSILLECTOMY     Current Outpatient Medications on File Prior to Visit  Medication Sig Dispense Refill   amLODipine (NORVASC) 10 MG  tablet Take 1 tablet by mouth daily.     B-D ULTRAFINE III SHORT PEN 31G X 8 MM MISC USE AS DIRECTED ONCE DAILY SUBCUTANEOUSLY  11   Calcium Citrate-Vitamin D (CALCIUM + D PO)      calcium-vitamin D (OSCAL WITH D) 500-200 MG-UNIT tablet Take 1 tablet by mouth 2 (two) times daily.      Continuous Blood Gluc Sensor (FREESTYLE LIBRE 2 SENSOR) MISC See admin instructions.     Cyanocobalamin (B-12 PO)      diphenhydramine-acetaminophen (TYLENOL PM) 25-500 MG TABS tablet Take 1 tablet by mouth at bedtime as needed (sleep).     Glucagon (GVOKE HYPOPEN 2-PACK) 0.5 MG/0.1ML SOAJ as needed for hypoglycemia     glucose blood test strip OneTouch Ultra Test strips  TEST ONCE A DAY ONCE A DAY FINGERSTICK 90     hydrALAZINE (APRESOLINE) 25 MG tablet Take 3 tablets (75 mg total) by mouth every 8 (eight) hours. 270 tablet 0   hydrochlorothiazide (HYDRODIURIL) 25 MG tablet Take 25 mg by mouth daily.     ibuprofen (ADVIL) 200 MG tablet Take 400 mg by mouth every 6 (six) hours as needed for moderate pain.     Insulin Degludec (TRESIBA FLEXTOUCH) 200 UNIT/ML SOPN Inject 36 Units into the skin at bedtime. (Patient taking differently: Inject 22 Units into the skin at bedtime.) 3 pen 0   Insulin Pen Needle (PEN NEEDLES) 32G X 4 MM MISC BD Ultra-Fine Nano Pen Needle 32 gauge x 5/32"  USE AS DIRECTED DAILY     ketoconazole (NIZORAL) 2 % cream Apply topically.     Lancets (ONETOUCH ULTRASOFT) lancets OneTouch UltraSoft Lancets  USE TWICE DAILY     Melatonin 10 MG CAPS Take 10 mg by mouth at bedtime as needed (sleep).     meloxicam (MOBIC) 7.5 MG tablet Take 1 tablet (7.5 mg total) by mouth daily as needed for pain. 30 tablet 3   Multiple Vitamins-Minerals (ADULT ONE DAILY GUMMIES) CHEW Chew 1 tablet by mouth 2 (two) times daily. Centrum     Multiple Vitamins-Minerals (AIRBORNE) TBEF Take 1 tablet by mouth daily as needed (immune support).     Multiple Vitamins-Minerals (CENTRUM SILVER PO)      Multiple  Vitamins-Minerals (PRESERVISION AREDS 2) CAPS Take 1 capsule by mouth 2 (two) times daily.     mupirocin ointment (BACTROBAN) 2 % Apply to right foot ulcer once daily. (Patient taking differently: Apply 1 application  topically daily as needed (wound care). Apply to right foot ulcer once daily.) 30 g 1   nortriptyline (PAMELOR) 25 MG capsule Take 1 capsule (25 mg total) by mouth at bedtime. (Patient taking differently: Take 25 mg by mouth 2 (two) times daily.) 30 capsule 0   Omega-3 Fatty Acids (FISH OIL) 1200 MG CAPS Take 1,200 mg by mouth daily with lunch.      omeprazole (PRILOSEC) 20 MG capsule Take 1 capsule by mouth daily.     ondansetron (ZOFRAN) 4 MG tablet Take  4 mg by mouth 3 (three) times daily as needed.     ONE TOUCH ULTRA TEST test strip      Polyethyl Glycol-Propyl Glycol (SYSTANE) 0.4-0.3 % SOLN Place 1 drop into both eyes 3 (three) times daily as needed (dry eyes). (Patient not taking: Reported on 01/22/2022)     PRESCRIPTION MEDICATION by Intravitreal route every 30 (thirty) days. Ophthalmic injection at MD office in left eye     RESTASIS 0.05 % ophthalmic emulsion Place 1 drop into both eyes 2 (two) times daily.     silver sulfADIAZINE (SILVADENE) 1 % cream Apply pea-sized amount to wound daily. (Patient taking differently: Apply 1 application  topically daily. Apply pea-sized amount to wound daily.) 50 g 0   simvastatin (ZOCOR) 20 MG tablet Take 1 tablet by mouth daily.     traMADol (ULTRAM) 50 MG tablet Take 1 tablet (50 mg total) by mouth 2 (two) times daily as needed. 20 tablet 0   vitamin B-12 (CYANOCOBALAMIN) 1000 MCG tablet Take 1,000 mcg by mouth 2 (two) times daily.      Current Facility-Administered Medications on File Prior to Visit  Medication Dose Route Frequency Provider Last Rate Last Admin   Bevacizumab (AVASTIN) SOLN 1.25 mg  1.25 mg Intravitreal  Bernarda Caffey, MD   1.25 mg at 03/01/18 1344   Bevacizumab (AVASTIN) SOLN 1.25 mg  1.25 mg Intravitreal  Bernarda Caffey, MD   1.25 mg at 03/31/18 1449    No Known Allergies Social History   Occupational History   Not on file  Tobacco Use   Smoking status: Former   Smokeless tobacco: Never  Vaping Use   Vaping Use: Never used  Substance and Sexual Activity   Alcohol use: No    Alcohol/week: 0.0 standard drinks of alcohol   Drug use: No   Sexual activity: Not Currently   History reviewed. No pertinent family history. Immunization History  Administered Date(s) Administered   Influenza, High Dose Seasonal PF 11/28/2018   PFIZER(Purple Top)SARS-COV-2 Vaccination 04/02/2019, 05/02/2019     Review of Systems: Negative except as noted in the HPI.   Objective: Vitals:   01/29/22 1122 01/29/22 1132  BP: (!) 180/80 (!) 161/70   CHANEE HENRICKSON is a pleasant 89 y.o. female in NAD. AAO X 3.  Vascular Examination: CFT <3 seconds b/l LE. Palpable DP pulse(s) b/l LE. Palpable PT pulse(s) b/l LE. Pedal hair absent. No pain with calf compression b/l. Lower extremity skin temperature gradient within normal limits. No edema noted b/l LE. No cyanosis or clubbing noted b/l LE.  Dermatological Examination: Pedal skin is warm and supple b/l LE. No open wounds b/l LE. No interdigital macerations noted b/l LE. Toenails 1-5 left foot, 3-5 right foot, and right great toe elongated, discolored, dystrophic, thickened, and crumbly with subungual debris and tenderness to dorsal palpation. Hyperkeratotic lesion(s) submet head 2 right foot.  No erythema, no edema, no drainage, no fluctuance.  Neurological Examination: Protective sensation diminished with 10g monofilament b/l.  Musculoskeletal Examination: Muscle strength 5/5 to all lower extremity muscle groups bilaterally. No pain, crepitus or joint limitation noted with ROM bilateral LE. Lower extremity amputation(s): partial amputation of right second digit. Utilizes rollator for ambulation assistance.  Footwear Assessment: Does the patient wear appropriate  shoes? Yes. Does the patient need inserts/orthotics? Yes.  ADA Risk Categorization: High Risk  Patient has one or more of the following: Loss of protective sensation Absent pedal pulses Severe Foot deformity History of foot ulcer  Assessment:  1. Pain due to onychomycosis of toenails of both feet   2. Callus   3. History of partial amputation of toe of right foot ([place])   4. Diabetic peripheral neuropathy associated with type 2 diabetes mellitus ([place])   5. Encounter for diabetic foot exam ([place])      Plan: Orders Placed This Encounter  Procedures   For Home Use Only DME Diabetic Shoe    Dispense one pair extra depth shoes and 3 pair custom molded insoles. Offload callus submet head 2 right foot.   -Patient's family member present. All questions/concerns addressed on today's visit. -Diabetic foot examination performed today. -Continue foot and shoe inspections daily. Monitor blood glucose per PCP/Endocrinologist's recommendations. -Patient to continue soft, supportive shoe gear daily. -Toenails 1-5 left foot, 3-5 right foot, and right great toe debrided in length and girth without iatrogenic bleeding with sterile nail nipper and dremel.  -Callus(es) submet head 2 right foot pared utilizing sterile scalpel blade without complication or incident. Total number debrided =1. -Patient/POA to call should there be question/concern in the interim. Return in about 3 months (around 04/30/2022).  Marzetta Board, DPM

## 2022-03-04 NOTE — Progress Notes (Signed)
[facility] Note  03/10/2022   CHIEF COMPLAINT Patient presents for Retina Follow Up  HISTORY OF PRESENT ILLNESS: Sue [name] is a 89 y.o. female who presents to the clinic today for:  HPI     Retina Follow Up   Patient presents with  Wet AMD.  In left eye.  This started 7 weeks ago.  I, the attending physician,  performed the HPI with the patient and updated documentation appropriately.        Comments   Patient here for 7 weeks retina follow up for exu ARMD OS. Patient states vision doing pretty good. No eye pain. Uses Restasis BID OU and AT QD OU.      Last edited by Bernarda Caffey, MD on 03/10/2022 12:20 PM.      Referring physician: Merrilee Seashore, [address] [place],  [place] [address]  HISTORICAL INFORMATION:   Selected notes from the MEDICAL RECORD NUMBER Referred by Dr. Quentin Ore for concern of ARMD OU   CURRENT MEDICATIONS: Current Outpatient Medications (Ophthalmic Drugs)  Medication Sig   RESTASIS 0.05 % ophthalmic emulsion Place 1 drop into both eyes 2 (two) times daily.   Polyethyl Glycol-Propyl Glycol (SYSTANE) 0.4-0.3 % SOLN Place 1 drop into both eyes 3 (three) times daily as needed (dry eyes). (Patient not taking: Reported on 01/22/2022)   No current facility-administered medications for this visit. (Ophthalmic Drugs)   Current Outpatient Medications (Other)  Medication Sig   amLODipine (NORVASC) 10 MG tablet Take 1 tablet by mouth daily.   B-D ULTRAFINE III SHORT PEN 31G X 8 MM MISC USE AS DIRECTED ONCE DAILY SUBCUTANEOUSLY   Calcium Citrate-Vitamin D (CALCIUM + D PO)    calcium-vitamin D (OSCAL WITH D) 500-200 MG-UNIT tablet Take 1 tablet by mouth 2 (two) times daily.    Continuous Blood Gluc Sensor (FREESTYLE LIBRE 2 SENSOR) MISC See admin instructions.   Cyanocobalamin (B-12 PO)    diphenhydramine-acetaminophen (TYLENOL PM) 25-500 MG TABS tablet Take 1 tablet by mouth at bedtime as needed  (sleep).   Glucagon (GVOKE HYPOPEN 2-PACK) 0.5 MG/0.1ML SOAJ as needed for hypoglycemia   glucose blood test strip OneTouch Ultra Test strips  TEST ONCE A DAY ONCE A DAY FINGERSTICK 90   hydrALAZINE (APRESOLINE) 25 MG tablet Take 3 tablets (75 mg total) by mouth every 8 (eight) hours.   hydrochlorothiazide (HYDRODIURIL) 25 MG tablet Take 25 mg by mouth daily.   ibuprofen (ADVIL) 200 MG tablet Take 400 mg by mouth every 6 (six) hours as needed for moderate pain.   Insulin Degludec (TRESIBA FLEXTOUCH) 200 UNIT/ML SOPN Inject 36 Units into the skin at bedtime. (Patient taking differently: Inject 22 Units into the skin at bedtime.)   Insulin Pen Needle (PEN NEEDLES) 32G X 4 MM MISC BD Ultra-Fine Nano Pen Needle 32 gauge x 5/32"  USE AS DIRECTED DAILY   ketoconazole (NIZORAL) 2 % cream Apply topically.   Lancets (ONETOUCH ULTRASOFT) lancets OneTouch UltraSoft Lancets  USE TWICE DAILY   Melatonin 10 MG CAPS Take 10 mg by mouth at bedtime as needed (sleep).   meloxicam (MOBIC) 7.5 MG tablet Take 1 tablet (7.5 mg total) by mouth daily as needed for pain.   Multiple Vitamins-Minerals (ADULT ONE DAILY GUMMIES) CHEW Chew 1 tablet by mouth 2 (two) times daily. Centrum   Multiple Vitamins-Minerals (AIRBORNE) TBEF Take 1 tablet by mouth daily as needed (immune support).   Multiple Vitamins-Minerals (CENTRUM SILVER PO)  Multiple Vitamins-Minerals (PRESERVISION AREDS 2) CAPS Take 1 capsule by mouth 2 (two) times daily.   mupirocin ointment (BACTROBAN) 2 % Apply to right foot ulcer once daily. (Patient taking differently: Apply 1 application  topically daily as needed (wound care). Apply to right foot ulcer once daily.)   nortriptyline (PAMELOR) 25 MG capsule Take 1 capsule (25 mg total) by mouth at bedtime. (Patient taking differently: Take 25 mg by mouth 2 (two) times daily.)   Omega-3 Fatty Acids (FISH OIL) 1200 MG CAPS Take 1,200 mg by mouth daily with lunch.    omeprazole (PRILOSEC) 20 MG capsule Take 1  capsule by mouth daily.   ondansetron (ZOFRAN) 4 MG tablet Take 4 mg by mouth 3 (three) times daily as needed.   ONE TOUCH ULTRA TEST test strip    PRESCRIPTION MEDICATION by Intravitreal route every 30 (thirty) days. Ophthalmic injection at MD office in left eye   silver sulfADIAZINE (SILVADENE) 1 % cream Apply pea-sized amount to wound daily. (Patient taking differently: Apply 1 application  topically daily. Apply pea-sized amount to wound daily.)   simvastatin (ZOCOR) 20 MG tablet Take 1 tablet by mouth daily.   traMADol (ULTRAM) 50 MG tablet Take 1 tablet (50 mg total) by mouth 2 (two) times daily as needed.   vitamin B-12 (CYANOCOBALAMIN) 1000 MCG tablet Take 1,000 mcg by mouth 2 (two) times daily.    Current Facility-Administered Medications (Other)  Medication Route   Bevacizumab (AVASTIN) SOLN 1.25 mg Intravitreal   Bevacizumab (AVASTIN) SOLN 1.25 mg Intravitreal   REVIEW OF SYSTEMS: ROS   Positive for: Neurological, Skin, Genitourinary, Musculoskeletal, Endocrine, Eyes Negative for: Constitutional, Gastrointestinal, HENT, Cardiovascular, Respiratory, Psychiatric, Allergic/Imm, Heme/Lymph Last edited by Theodore Demark, COA on 03/10/2022  9:55 AM.     ALLERGIES No Known Allergies  PAST MEDICAL HISTORY Past Medical History:  Diagnosis Date   Anemia    Arthritis    Basal cell carcinoma 11/23/2017   left elbow, right neck TX CX3 5FU    Basal cell carcinoma 04/04/2019   infil-right sideburn (CX35FU)   Chronic kidney disease    stage 3 ckd stage 3 b per dr Ashby Dawes 09-26-2019 note on chart   Complication of anesthesia    deifficulty waking up   COVID-19    Diabetic neuropathy ([place])    Hyperlipidemia    Hypertension    Hypertensive retinopathy    OU   Infiltrative basal cell carcinoma (BCC) 04/22/2020   Right Zygomatic Area    Macular degeneration    OU   Nodular basal cell carcinoma (BCC) 04/22/2020   Right Anterior Neck   Pneumonia    Right carotid bruit per  dr Ashby Dawes 09-26-2019 note on chart   SCC (squamous cell carcinoma) 11/23/2017   right forehead TX CX3 5FU   SCCA (squamous cell carcinoma) of skin 04/22/2020   Mid Parietal Scalp (in situ)   SCCA (squamous cell carcinoma) of skin 04/22/2020   Right Dorsal Hand (in situ)   Type 2 DM with CKD stage 3 and hypertension ([place])    Past Surgical History:  Procedure Laterality Date   ABDOMINAL HYSTERECTOMY     AMPUTATION TOE Right 09/28/2019   Procedure: Right 2nd toe amputation - partial;  Surgeon: Evelina Bucy, DPM;  Location: [facility];  Service: Podiatry;  Laterality: Right;   CATARACT EXTRACTION     CHOLECYSTECTOMY     EYE SURGERY     HERNIA REPAIR     SPINE SURGERY  TONSILLECTOMY     FAMILY HISTORY History reviewed. No pertinent family history.  SOCIAL HISTORY Social History   Tobacco Use   Smoking status: Former   Smokeless tobacco: Never  Scientific laboratory technician Use: Never used  Substance Use Topics   Alcohol use: No    Alcohol/week: 0.0 standard drinks of alcohol   Drug use: No       OPHTHALMIC EXAM:  Base Eye Exam     Visual Acuity (Snellen - Linear)       Right Left   Dist cc 20/40 -2 20/40 -2   Dist ph cc NI 20/30 -2    Correction: Glasses         Tonometry (Tonopen, 9:52 AM)       Right Left   Pressure 18 16         Pupils       Dark Light Shape React APD   Right 4 3 Round Brisk None   Left 4 3 Round Brisk None         Visual Fields (Counting fingers)       Left Right    Full Full         Extraocular Movement       Right Left    Full, Ortho Full, Ortho         Neuro/Psych     Oriented x3: Yes   Mood/Affect: Normal         Dilation     Both eyes: 1.0% Mydriacyl, 2.5% Phenylephrine @ 9:52 AM           Slit Lamp and Fundus Exam     Slit Lamp Exam       Right Left   Lids/Lashes Dermatochalasis - upper lid, Telangiectasia, mild MGD Dermatochalasis - upper lid, Telangiectasia, Meibomian  gland dysfunction   Conjunctiva/Sclera White and quiet White and quiet   Cornea Temporal Well healed cataract wounds, mild Arcus, trace endo pigment nasally, 1+PEE Arcus, Temporal Well healed cataract wounds, 1-2+ PEE   Anterior Chamber Deep and quiet Deep and quiet   Iris Round and moderately dilated to 4.72m Round and moderately dilated to 569m Iris atrophy from 0100 to 0230, peripheral irodotimy at 0200   Lens 3-piece PC IOL in good position PC IOL in good position   Anterior Vitreous Vitreous syneresis, Posterior vitreous detachment, Weiss ring, vitreous condensations Vitreous syneresis, PVD, vitreous condensations         Fundus Exam       Right Left   Disc 360 Peripapillary atrophy, diffuse mild pallor, compact, sharp rim mild pallor, Peripapillary atrophy and pigmentation   C/D Ratio 0.1 0.2   Macula Blunted foveal reflex, +PED -- collapsing, RPE mottling, clumping and early atrophy, Drusen, No heme or edema Blunted foveal reflex, low lying PED / +CNVM with stable improvement in IRF and SRF, RPE mottling and clumping, Drusen, No heme, early atrophy   Vessels Attenuated, Tortuous Attenuated, Tortuous   Periphery Attached, 360 Reticular degeneration Attached, 360 Reticular degeneration, No heme           Refraction     Wearing Rx       Sphere Cylinder Axis Add   Right -2.00 +1.25 125 +2.50   Left +0.25 +0.50 016 +2.50           IMAGING AND PROCEDURES  Imaging and Procedures for '@TODAY'$ @  OCT, Retina - OU - Both Eyes       Right Eye Quality was borderline.  Central Foveal Thickness: 237. Progression has improved. Findings include normal foveal contour, no IRF, no SRF, retinal drusen , subretinal hyper-reflective material, intraretinal hyper-reflective material, pigment epithelial detachment, outer retinal atrophy (Interval improvement in central PED and IRHM).   Left Eye Quality was good. Central Foveal Thickness: 253. Progression has been stable. Findings include  normal foveal contour, no IRF, retinal drusen , epiretinal membrane, pigment epithelial detachment, subretinal fluid, outer retinal atrophy (stable improvement in SRF overlying stable low PED; partial PVD).   Notes *Images captured and stored on drive  Diagnosis / Impression:  OD: Interval improvement in central PED and IRHM -- no fluid OS: Exudative ARMD -- stable improvement in SRF overlying stable low PED; partial PVD  Clinical management:  See below  Abbreviations: NFP - Normal foveal profile. CME - cystoid macular edema. PED - pigment epithelial detachment. IRF - intraretinal fluid. SRF - subretinal fluid. EZ - ellipsoid zone. ERM - epiretinal membrane. ORA - outer retinal atrophy. ORT - outer retinal tubulation. SRHM - subretinal hyper-reflective material       Intravitreal Injection, Pharmacologic Agent - OS - Left Eye       Time Out 03/10/2022. 10:51 AM. Confirmed correct patient, procedure, site, and patient consented.   Anesthesia Topical anesthesia was used. Anesthetic medications included Lidocaine 2%, Proparacaine 0.5%.   Procedure Preparation included 5% betadine to ocular surface, eyelid speculum. A (32g) needle was used.   Injection: 1.25 mg Bevacizumab 1.'25mg'$ /0.43m   Route: Intravitreal, Site: Left Eye   NDC: [id number] Lot:: [id number] Expiration date: 06/05/2022   Post-op Post injection exam found visual acuity of at least counting fingers. The patient tolerated the procedure well. There were no complications. The patient received written and verbal post procedure care education. Post injection medications were not given.            ASSESSMENT/PLAN:    ICD-10-CM   1. Exudative age-related macular degeneration of left eye with active choroidal neovascularization (HCC)  H35.3221 OCT, Retina - OU - Both Eyes    Intravitreal Injection, Pharmacologic Agent - OS - Left Eye    Bevacizumab (AVASTIN) SOLN 1.25 mg    2. Intermediate stage nonexudative  age-related macular degeneration of right eye  H35.3112     3. Diabetes mellitus type 2 without retinopathy ([place]  E11.9     4. Essential hypertension  I10     5. Hypertensive retinopathy of both eyes  H35.033     6. Pseudophakia of both eyes  Z96.1      1. Exudative age-related macular degeneration, left eye - conversion of OS from nonexudative to exudative ARMD prior to 02.05.20 visit - s/p IVA OS #1 (02.05.20), #2 (03.04.20), #3 (04.30.20), #4 (06.02.20), #5 (07.14.20), #6 (9.1.20), #7 (10.27.20), #8 (12.23.20), #9 (02.03.21), #10 (03.17.21), #11 (04.28.21), #12 (06.09.21), #13 (08.04.21), #14 (09.29.21), #15 (11.22.2021), #16 (01.26.22), #17 (04.06.22), #18 (06.28.22), #19 (9.13.22), #20 (11.29.22), #21 (01.31.23), #22 (04.04.23), #23 (05.30.23), #24 (07.27.23), #25 (09.19.23), #26 (11.07.23), #27 (12.29.23) **history of recurrent / worsening SRF at 9 wk interval, noted on 04.04.23 visit, at 8 week interval on 09.19.23** - OCT shows OS: stable improvement in SRF overlying stable low PED; partial PVD  at 7 wks  - BCVA OS 20/30 from 20/40   - recommend IVA OS #28 today, 02.14.24 w/ f/u in 8 wks  - pt wishes to be treated with IVA OS - RBA of procedure discussed, questions answered  - see procedure notes  - Avastin informed consent obtained, re-signed and scanned  05.30.23  - f/u in 8 wks for DFE, OCT, likely injection OS   2. Age related macular degeneration, non-exudative, right eye  - stable, former pt of Dr. Zigmund Daniel -- no significant change in OCT or exam - The incidence, anatomy, and pathology of dry AMD, risk of progression, and the AREDS and AREDS 2 study including smoking risks discussed with patient.  - recommend amsler grid monitoring  3. Diabetes mellitus, type 2 without retinopathy - The incidence, risk factors for progression, natural history and treatment options for diabetic retinopathy  were discussed with patient.   - The need for close monitoring of blood glucose,  blood pressure, and serum lipids, avoiding cigarette or any type of tobacco, and the need for long term follow up was also discussed with patient.  - f/u in 1 year  4,5. Hypertensive retinopathy OU  - discussed importance of tight BP control  - continue to monitor  6. Pseudophakia OU  - s/p CE/IOL OU  - beautiful surgeries, doing well  - continue to monitor  Ophthalmic Meds Ordered this visit:  Meds ordered this encounter  Medications   Bevacizumab (AVASTIN) SOLN 1.25 mg    Return in about 8 weeks (around 05/05/2022) for f/u Ex. AMD OS , DFE, OCT, Possible, IVA, OS.  There are no Patient Instructions on file for this visit.   This document serves as a record of services personally performed by Gardiner Sleeper, MD, PhD. It was created on their behalf by Orvan Falconer, an ophthalmic technician. The creation of this record is the provider's dictation and/or activities during the visit.    Electronically signed by: Orvan Falconer, OA, 03/10/22  1:14 PM  This document serves as a record of services personally performed by Gardiner Sleeper, MD, PhD. It was created on their behalf by Renaldo Reel, [place] an ophthalmic technician. The creation of this record is the provider's dictation and/or activities during the visit.    Electronically signed by:  Renaldo Reel, COT  02.14.24 1:14 PM  Gardiner Sleeper, M.D., Ph.D. Diseases & Surgery of the Retina and Vitreous [facility]  I have reviewed the above documentation for accuracy and completeness, and I agree with the above. Gardiner Sleeper, M.D., Ph.D. 03/10/22 1:16 PM  Abbreviations: M myopia (nearsighted); A astigmatism; H hyperopia (farsighted); P presbyopia; Mrx spectacle prescription;  CTL contact lenses; OD right eye; OS left eye; OU both eyes  XT exotropia; ET esotropia; PEK punctate epithelial keratitis; PEE punctate epithelial erosions; DES dry eye syndrome; MGD meibomian gland dysfunction; ATs  artificial tears; PFAT's preservative free artificial tears; [place] nuclear sclerotic cataract; PSC posterior subcapsular cataract; ERM epi-retinal membrane; PVD posterior vitreous detachment; RD retinal detachment; DM diabetes mellitus; DR diabetic retinopathy; NPDR non-proliferative diabetic retinopathy; PDR proliferative diabetic retinopathy; CSME clinically significant macular edema; DME diabetic macular edema; dbh dot blot hemorrhages; CWS cotton wool spot; POAG primary open angle glaucoma; C/D cup-to-disc ratio; HVF humphrey visual field; GVF goldmann visual field; OCT optical coherence tomography; IOP intraocular pressure; BRVO Branch retinal vein occlusion; CRVO central retinal vein occlusion; CRAO central retinal artery occlusion; BRAO branch retinal artery occlusion; RT retinal tear; SB scleral buckle; PPV pars plana vitrectomy; VH Vitreous hemorrhage; PRP panretinal laser photocoagulation; IVK intravitreal kenalog; VMT vitreomacular traction; MH Macular hole;  NVD neovascularization of the disc; NVE neovascularization elsewhere; AREDS age related eye disease study; ARMD age related macular degeneration; POAG primary open angle glaucoma; EBMD epithelial/anterior basement membrane dystrophy; ACIOL anterior chamber  intraocular lens; IOL intraocular lens; PCIOL posterior chamber intraocular lens; Phaco/IOL phacoemulsification with intraocular lens placement; [place] photorefractive keratectomy; LASIK laser assisted in situ keratomileusis; HTN hypertension; DM diabetes mellitus; COPD chronic obstructive pulmonary disease

## 2022-03-10 ENCOUNTER — Ambulatory Visit (INDEPENDENT_AMBULATORY_CARE_PROVIDER_SITE_OTHER): Payer: Medicare Other | Admitting: Ophthalmology

## 2022-03-10 ENCOUNTER — Encounter (INDEPENDENT_AMBULATORY_CARE_PROVIDER_SITE_OTHER): Payer: Self-pay | Admitting: Ophthalmology

## 2022-03-10 DIAGNOSIS — H35033 Hypertensive retinopathy, bilateral: Secondary | ICD-10-CM | POA: Diagnosis not present

## 2022-03-10 DIAGNOSIS — H353112 Nonexudative age-related macular degeneration, right eye, intermediate dry stage: Secondary | ICD-10-CM | POA: Diagnosis not present

## 2022-03-10 DIAGNOSIS — H353221 Exudative age-related macular degeneration, left eye, with active choroidal neovascularization: Secondary | ICD-10-CM | POA: Diagnosis not present

## 2022-03-10 DIAGNOSIS — I10 Essential (primary) hypertension: Secondary | ICD-10-CM

## 2022-03-10 DIAGNOSIS — Z961 Presence of intraocular lens: Secondary | ICD-10-CM

## 2022-03-10 DIAGNOSIS — E119 Type 2 diabetes mellitus without complications: Secondary | ICD-10-CM

## 2022-03-10 MED ORDER — BEVACIZUMAB CHEMO INJECTION 1.25MG/0.05ML SYRINGE FOR KALEIDOSCOPE
1.2500 mg | INTRAVITREAL | Status: AC | PRN
  Administered 2022-03-10: 1.25 mg via INTRAVITREAL

## 2022-03-12 ENCOUNTER — Encounter (INDEPENDENT_AMBULATORY_CARE_PROVIDER_SITE_OTHER): Payer: Medicare Other | Admitting: Ophthalmology

## 2022-03-17 ENCOUNTER — Other Ambulatory Visit: Payer: Self-pay | Admitting: Internal Medicine

## 2022-03-17 DIAGNOSIS — N949 Unspecified condition associated with female genital organs and menstrual cycle: Secondary | ICD-10-CM

## 2022-04-08 ENCOUNTER — Ambulatory Visit
Admission: RE | Admit: 2022-04-08 | Discharge: 2022-04-08 | Disposition: A | Discharge status: Home / Self Care | Payer: Medicare Other | Source: Ambulatory Visit | Attending: Internal Medicine | Admitting: Internal Medicine

## 2022-04-08 DIAGNOSIS — N949 Unspecified condition associated with female genital organs and menstrual cycle: Secondary | ICD-10-CM

## 2022-04-20 NOTE — Progress Notes (Addendum)
[facility] - Clinic Note  05/04/2022   CHIEF COMPLAINT Patient presents for Retina Follow Up  HISTORY OF PRESENT ILLNESS: Sue [name] is a 89 y.o. female who presents to the clinic today for:  HPI     Retina Follow Up   Patient presents with  Wet AMD.  In left eye.  Severity is moderate.  Duration of 8 weeks.  Since onset it is stable.  I, the attending physician,  performed the HPI with the patient and updated documentation appropriately.        Comments   8 week Retina eval for Exu armd os. Patient states vision is pretty good. Seems about the same      Last edited by Rennis Chris, MD on 05/04/2022 12:24 PM.     Patient states vision seems about the same OU.  Referring physician: [name], MD [address] [place],  [place] [address]  HISTORICAL INFORMATION:   Selected notes from the MEDICAL RECORD NUMBER Referred by Dr. Baker Pierini for concern of ARMD OU   CURRENT MEDICATIONS: Current Outpatient Medications (Ophthalmic Drugs)  Medication Sig   RESTASIS 0.05 % ophthalmic emulsion Place 1 drop into both eyes 2 (two) times daily.   Polyethyl Glycol-Propyl Glycol (SYSTANE) 0.4-0.3 % SOLN Place 1 drop into both eyes 3 (three) times daily as needed (dry eyes). (Patient not taking: Reported on 01/22/2022)   No current facility-administered medications for this visit. (Ophthalmic Drugs)   Current Outpatient Medications (Other)  Medication Sig   amLODipine (NORVASC) 10 MG tablet Take 1 tablet by mouth daily.   B-D ULTRAFINE III SHORT PEN 31G X 8 MM MISC USE AS DIRECTED ONCE DAILY SUBCUTANEOUSLY   Calcium Citrate-Vitamin D (CALCIUM + D PO)    calcium-vitamin D (OSCAL WITH D) 500-200 MG-UNIT tablet Take 1 tablet by mouth 2 (two) times daily.    Continuous Blood Gluc Sensor (FREESTYLE LIBRE 2 SENSOR) MISC See admin instructions.   Cyanocobalamin (B-12 PO)    diphenhydramine-acetaminophen (TYLENOL PM) 25-500 MG TABS tablet Take  1 tablet by mouth at bedtime as needed (sleep).   Glucagon (GVOKE HYPOPEN 2-PACK) 0.5 MG/0.1ML SOAJ as needed for hypoglycemia   glucose blood test strip OneTouch Ultra Test strips  TEST ONCE A DAY ONCE A DAY FINGERSTICK 90   hydrALAZINE (APRESOLINE) 25 MG tablet Take 3 tablets (75 mg total) by mouth every 8 (eight) hours.   hydrochlorothiazide (HYDRODIURIL) 25 MG tablet Take 25 mg by mouth daily.   ibuprofen (ADVIL) 200 MG tablet Take 400 mg by mouth every 6 (six) hours as needed for moderate pain.   Insulin Degludec (TRESIBA FLEXTOUCH) 200 UNIT/ML SOPN Inject 36 Units into the skin at bedtime. (Patient taking differently: Inject 22 Units into the skin at bedtime.)   Insulin Pen Needle (PEN NEEDLES) 32G X 4 MM MISC BD Ultra-Fine Nano Pen Needle 32 gauge x 5/32"  USE AS DIRECTED DAILY   ketoconazole (NIZORAL) 2 % cream Apply topically.   Lancets (ONETOUCH ULTRASOFT) lancets OneTouch UltraSoft Lancets  USE TWICE DAILY   Melatonin 10 MG CAPS Take 10 mg by mouth at bedtime as needed (sleep).   meloxicam (MOBIC) 7.5 MG tablet Take 1 tablet (7.5 mg total) by mouth daily as needed for pain.   Multiple Vitamins-Minerals (ADULT ONE DAILY GUMMIES) CHEW Chew 1 tablet by mouth 2 (two) times daily. Centrum   Multiple Vitamins-Minerals (AIRBORNE) TBEF Take 1 tablet by mouth daily as needed (immune support).   Multiple  Vitamins-Minerals (CENTRUM SILVER PO)    Multiple Vitamins-Minerals (PRESERVISION AREDS 2) CAPS Take 1 capsule by mouth 2 (two) times daily.   mupirocin ointment (BACTROBAN) 2 % Apply to right foot ulcer once daily. (Patient taking differently: Apply 1 application  topically daily as needed (wound care). Apply to right foot ulcer once daily.)   nortriptyline (PAMELOR) 25 MG capsule Take 1 capsule (25 mg total) by mouth at bedtime. (Patient taking differently: Take 25 mg by mouth 2 (two) times daily.)   Omega-3 Fatty Acids (FISH OIL) 1200 MG CAPS Take 1,200 mg by mouth daily with lunch.     omeprazole (PRILOSEC) 20 MG capsule Take 1 capsule by mouth daily.   ondansetron (ZOFRAN) 4 MG tablet Take 4 mg by mouth 3 (three) times daily as needed.   ONE TOUCH ULTRA TEST test strip    PRESCRIPTION MEDICATION by Intravitreal route every 30 (thirty) days. Ophthalmic injection at MD office in left eye   silver sulfADIAZINE (SILVADENE) 1 % cream Apply pea-sized amount to wound daily. (Patient taking differently: Apply 1 application  topically daily. Apply pea-sized amount to wound daily.)   simvastatin (ZOCOR) 20 MG tablet Take 1 tablet by mouth daily.   traMADol (ULTRAM) 50 MG tablet Take 1 tablet (50 mg total) by mouth 2 (two) times daily as needed.   vitamin B-12 (CYANOCOBALAMIN) 1000 MCG tablet Take 1,000 mcg by mouth 2 (two) times daily.    Current Facility-Administered Medications (Other)  Medication Route   Bevacizumab (AVASTIN) SOLN 1.25 mg Intravitreal   Bevacizumab (AVASTIN) SOLN 1.25 mg Intravitreal   REVIEW OF SYSTEMS: ROS   Positive for: Neurological, Skin, Genitourinary, Musculoskeletal, Endocrine, Eyes Negative for: Constitutional, Gastrointestinal, HENT, Cardiovascular, Respiratory, Psychiatric, Allergic/Imm, Heme/Lymph Last edited by Lana Fish, COT on 05/04/2022  9:09 AM.      ALLERGIES No Known Allergies  PAST MEDICAL HISTORY Past Medical History:  Diagnosis Date   Anemia    Arthritis    Basal cell carcinoma 11/23/2017   left elbow, right neck TX CX3 5FU    Basal cell carcinoma 04/04/2019   infil-right sideburn (CX35FU)   Chronic kidney disease    stage 3 ckd stage 3 b per dr Nicholos Johns 09-26-2019 note on chart   Complication of anesthesia    deifficulty waking up   COVID-19    Diabetic neuropathy    Hyperlipidemia    Hypertension    Hypertensive retinopathy    OU   Infiltrative basal cell carcinoma (BCC) 04/22/2020   Right Zygomatic Area    Macular degeneration    OU   Nodular basal cell carcinoma (BCC) 04/22/2020   Right Anterior Neck    Pneumonia    Right carotid bruit per dr Nicholos Johns 09-26-2019 note on chart   SCC (squamous cell carcinoma) 11/23/2017   right forehead TX CX3 5FU   SCCA (squamous cell carcinoma) of skin 04/22/2020   Mid Parietal Scalp (in situ)   SCCA (squamous cell carcinoma) of skin 04/22/2020   Right Dorsal Hand (in situ)   Type 2 DM with CKD stage 3 and hypertension    Past Surgical History:  Procedure Laterality Date   ABDOMINAL HYSTERECTOMY     AMPUTATION TOE Right 09/28/2019   Procedure: Right 2nd toe amputation - partial;  Surgeon: Park Liter, DPM;  Location: [facility];  Service: Podiatry;  Laterality: Right;   CATARACT EXTRACTION     CHOLECYSTECTOMY     EYE SURGERY     HERNIA REPAIR  SPINE SURGERY     TONSILLECTOMY     FAMILY HISTORY History reviewed. No pertinent family history.  SOCIAL HISTORY Social History   Tobacco Use   Smoking status: Former   Smokeless tobacco: Never  Building services engineer Use: Never used  Substance Use Topics   Alcohol use: No    Alcohol/week: 0.0 standard drinks of alcohol   Drug use: No       OPHTHALMIC EXAM:  Base Eye Exam     Visual Acuity (Snellen - Linear)       Right Left   Dist cc 20/30-2 20/40-2   Dist ph cc 20/NI 20/NI    Correction: Glasses         Tonometry (Tonopen, 9:12 AM)       Right Left   Pressure 13 15         Pupils       Dark Light Shape React APD   Right 3 2 Round Brisk None   Left 3 2 Round Brisk None         Visual Fields (Counting fingers)       Left Right    Full Full         Extraocular Movement       Right Left    Full, Ortho Full, Ortho         Neuro/Psych     Oriented x3: Yes   Mood/Affect: Normal         Dilation     Both eyes: 1.0% Mydriacyl, 2.5% Phenylephrine @ 9:12 AM           Slit Lamp and Fundus Exam     Slit Lamp Exam       Right Left   Lids/Lashes Dermatochalasis - upper lid, Telangiectasia, mild MGD Dermatochalasis - upper  lid, Telangiectasia, Meibomian gland dysfunction   Conjunctiva/Sclera White and quiet White and quiet   Cornea Temporal Well healed cataract wounds, mild Arcus, trace endo pigment nasally, 1+PEE Arcus, Temporal Well healed cataract wounds, 1-2+ PEE   Anterior Chamber Deep and quiet Deep and quiet   Iris Round and moderately dilated to 4.64mm Round and moderately dilated to 41mm, Iris atrophy from 0100 to 0230, peripheral irodotimy at 0200   Lens 3-piece PC IOL in good position PC IOL in good position   Anterior Vitreous Vitreous syneresis, Posterior vitreous detachment, Weiss ring, vitreous condensations Vitreous syneresis, PVD, vitreous condensations         Fundus Exam       Right Left   Disc 360 Peripapillary atrophy, diffuse mild pallor, compact, sharp rim mild pallor, Peripapillary atrophy and pigmentation   C/D Ratio 0.1 0.2   Macula Blunted foveal reflex, +PED -- collapsed, RPE mottling, clumping and early atrophy, Drusen, No heme or edema Blunted foveal reflex, low lying PED / +CNVM with stable improvement in IRF and SRF, RPE mottling and clumping, Drusen, No heme, early atrophy   Vessels Attenuated, Tortuous Attenuated, Tortuous   Periphery Attached, 360 Reticular degeneration Attached, 360 Reticular degeneration, No heme           Refraction     Wearing Rx       Sphere Cylinder Axis Add   Right -2.00 +1.25 125 +2.50   Left +0.25 +0.50 016 +2.50           IMAGING AND PROCEDURES  Imaging and Procedures for @TODAY @  OCT, Retina - OU - Both Eyes       Right Eye  Quality was good. Central Foveal Thickness: 213. Progression has been stable. Findings include normal foveal contour, no IRF, no SRF, retinal drusen , subretinal hyper-reflective material, intraretinal hyper-reflective material, pigment epithelial detachment, outer retinal atrophy (Stable improvement in central PED and IRHM--no fluid).   Left Eye Quality was good. Central Foveal Thickness: 255. Progression  has been stable. Findings include normal foveal contour, no IRF, retinal drusen , epiretinal membrane, pigment epithelial detachment, subretinal fluid, outer retinal atrophy (stable improvement in SRF overlying stable low PED; partial PVD).   Notes *Images captured and stored on drive  Diagnosis / Impression:  OD: stable improvement in central PED and IRHM -- no fluid OS: Exudative ARMD -- stable improvement in SRF overlying stable low PED; partial PVD  Clinical management:  See below  Abbreviations: NFP - Normal foveal profile. CME - cystoid macular edema. PED - pigment epithelial detachment. IRF - intraretinal fluid. SRF - subretinal fluid. EZ - ellipsoid zone. ERM - epiretinal membrane. ORA - outer retinal atrophy. ORT - outer retinal tubulation. SRHM - subretinal hyper-reflective material       Intravitreal Injection, Pharmacologic Agent - OS - Left Eye       Time Out 05/04/2022. 10:44 AM. Confirmed correct patient, procedure, site, and patient consented.   Anesthesia Topical anesthesia was used. Anesthetic medications included Lidocaine 2%, Proparacaine 0.5%.   Procedure Preparation included 5% betadine to ocular surface, eyelid speculum. A supplied (32g) needle was used.   Injection: 1.25 mg Bevacizumab 1.25mg /0.3305ml   Route: Intravitreal, Site: Left Eye   NDC: [id number], Lot: [id number], Expiration date: 06/12/2022   Post-op Post injection exam found visual acuity of at least counting fingers. The patient tolerated the procedure well. There were no complications. The patient received written and verbal post procedure care education. Post injection medications were not given.             ASSESSMENT/PLAN:    ICD-10-CM   1. Exudative age-related macular degeneration of left eye with active choroidal neovascularization  H35.3221 OCT, Retina - OU - Both Eyes    Intravitreal Injection, Pharmacologic Agent - OS - Left Eye    Bevacizumab (AVASTIN) SOLN 1.25 mg    2.  Intermediate stage nonexudative age-related macular degeneration of right eye  H35.3112     3. Diabetes mellitus type 2 without retinopathy  E11.9     4. Essential hypertension  I10     5. Hypertensive retinopathy of both eyes  H35.033     6. Pseudophakia of both eyes  Z96.1       1. Exudative age-related macular degeneration, left eye - conversion of OS from nonexudative to exudative ARMD prior to 02.05.20 visit - s/p IVA OS #1 (02.05.20), #2 (03.04.20), #3 (04.30.20), #4 (06.02.20), #5 (07.14.20), #6 (9.1.20), #7 (10.27.20), #8 (12.23.20), #9 (02.03.21), #10 (03.17.21), #11 (04.28.21), #12 (06.09.21), #13 (08.04.21), #14 (09.29.21), #15 (11.22.2021), #16 (01.26.22), #17 (04.06.22), #18 (06.28.22), #19 (9.13.22), #20 (11.29.22), #21 (01.31.23), #22 (04.04.23), #23 (05.30.23), #24 (07.27.23), #25 (09.19.23), #26 (11.07.23), #27 (12.29.23), #28 (02.14.24) **history of recurrent / worsening SRF at 9 wk interval, noted on 04.04.23 visit, at 8 week interval on 09.19.23** - OCT shows OS: stable improvement in SRF overlying stable low PED; partial PVD at 8 wks  - BCVA OS 20/40-2 (slightly decreased from 20/30-2)  - recommend IVA OS #28 today, 02.14.24 w/ f/u in 8 wks again  - pt wishes to be treated with IVA OS - RBA of procedure discussed, questions answered  - see procedure notes  -  Avastin informed consent obtained, re-signed and scanned 05.30.23  - f/u in 8 wks for DFE, OCT, likely injection OS   2. Age related macular degeneration, non-exudative, right eye  - stable, former pt of Dr. Ashley RoyaltyMatthews -- no significant change in OCT or exam - The incidence, anatomy, and pathology of dry AMD, risk of progression, and the AREDS and AREDS 2 study including smoking risks discussed with patient.  - recommend amsler grid monitoring  3. Diabetes mellitus, type 2 without retinopathy - The incidence, risk factors for progression, natural history and treatment options for diabetic retinopathy  were  discussed with patient.   - The need for close monitoring of blood glucose, blood pressure, and serum lipids, avoiding cigarette or any type of tobacco, and the need for long term follow up was also discussed with patient.  - f/u in 1 year  4,5. Hypertensive retinopathy OU  - discussed importance of tight BP control  - continue to monitor  6. Pseudophakia OU  - s/p CE/IOL OU  - beautiful surgeries, doing well  - continue to monitor  Ophthalmic Meds Ordered this visit:  Meds ordered this encounter  Medications   Bevacizumab (AVASTIN) SOLN 1.25 mg    Return in about 8 weeks (around 06/29/2022) for ex ARMD OS - DFE, OCT, Possible Injxn.  There are no Patient Instructions on file for this visit.    This document serves as a record of services personally performed by Karie ChimeraBrian G. Rosselyn Martha, MD, PhD. It was created on their behalf by Gerilyn Nestlehristine Shamlian, COT an ophthalmic technician. The creation of this record is the provider's dictation and/or activities during the visit.    Electronically signed by:  Gerilyn Nestlehristine Shamlian, COT  03.26.24 12:30 PM  This document serves as a record of services personally performed by Karie ChimeraBrian G. Wenda Vanschaick, MD, PhD. It was created on their behalf by Annalee Gentaaryl Barber, COMT. The creation of this record is the provider's dictation and/or activities during the visit.  Electronically signed by: Annalee Gentaaryl Barber, COMT 05/04/22 12:30 PM  Karie ChimeraBrian G. Daiveon Markman, M.D., Ph.D. Diseases & Surgery of the Retina and Vitreous [facility]  I have reviewed the above documentation for accuracy and completeness, and I agree with the above. Karie ChimeraBrian G. Halden Phegley, M.D., Ph.D. 05/04/22 12:30 PM   Abbreviations: M myopia (nearsighted); A astigmatism; H hyperopia (farsighted); P presbyopia; Mrx spectacle prescription;  CTL contact lenses; OD right eye; OS left eye; OU both eyes  XT exotropia; ET esotropia; PEK punctate epithelial keratitis; PEE punctate epithelial erosions; DES dry eye  syndrome; MGD meibomian gland dysfunction; ATs artificial tears; PFAT's preservative free artificial tears; NSC nuclear sclerotic cataract; PSC posterior subcapsular cataract; ERM epi-retinal membrane; PVD posterior vitreous detachment; RD retinal detachment; DM diabetes mellitus; DR diabetic retinopathy; NPDR non-proliferative diabetic retinopathy; PDR proliferative diabetic retinopathy; CSME clinically significant macular edema; DME diabetic macular edema; dbh dot blot hemorrhages; CWS cotton wool spot; POAG primary open angle glaucoma; C/D cup-to-disc ratio; HVF humphrey visual field; GVF goldmann visual field; OCT optical coherence tomography; IOP intraocular pressure; BRVO Branch retinal vein occlusion; CRVO central retinal vein occlusion; CRAO central retinal artery occlusion; BRAO branch retinal artery occlusion; RT retinal tear; SB scleral buckle; PPV pars plana vitrectomy; VH Vitreous hemorrhage; PRP panretinal laser photocoagulation; IVK intravitreal kenalog; VMT vitreomacular traction; MH Macular hole;  NVD neovascularization of the disc; NVE neovascularization elsewhere; AREDS age related eye disease study; ARMD age related macular degeneration; POAG primary open angle glaucoma; EBMD epithelial/anterior basement membrane dystrophy; ACIOL  anterior chamber intraocular lens; IOL intraocular lens; PCIOL posterior chamber intraocular lens; Phaco/IOL phacoemulsification with intraocular lens placement; Garner photorefractive keratectomy; LASIK laser assisted in situ keratomileusis; HTN hypertension; DM diabetes mellitus; COPD chronic obstructive pulmonary disease

## 2022-05-04 ENCOUNTER — Ambulatory Visit (INDEPENDENT_AMBULATORY_CARE_PROVIDER_SITE_OTHER): Payer: Medicare Other | Admitting: Ophthalmology

## 2022-05-04 ENCOUNTER — Encounter (INDEPENDENT_AMBULATORY_CARE_PROVIDER_SITE_OTHER): Payer: Self-pay | Admitting: Ophthalmology

## 2022-05-04 DIAGNOSIS — I10 Essential (primary) hypertension: Secondary | ICD-10-CM | POA: Diagnosis not present

## 2022-05-04 DIAGNOSIS — H353112 Nonexudative age-related macular degeneration, right eye, intermediate dry stage: Secondary | ICD-10-CM

## 2022-05-04 DIAGNOSIS — Z961 Presence of intraocular lens: Secondary | ICD-10-CM | POA: Diagnosis not present

## 2022-05-04 DIAGNOSIS — H353221 Exudative age-related macular degeneration, left eye, with active choroidal neovascularization: Secondary | ICD-10-CM | POA: Diagnosis not present

## 2022-05-04 DIAGNOSIS — H35033 Hypertensive retinopathy, bilateral: Secondary | ICD-10-CM

## 2022-05-04 DIAGNOSIS — E119 Type 2 diabetes mellitus without complications: Secondary | ICD-10-CM

## 2022-05-04 MED ORDER — BEVACIZUMAB CHEMO INJECTION 1.25MG/0.05ML SYRINGE FOR KALEIDOSCOPE
1.2500 mg | INTRAVITREAL | Status: AC | PRN
  Administered 2022-05-04: 1.25 mg via INTRAVITREAL

## 2022-05-11 ENCOUNTER — Encounter: Payer: Self-pay | Admitting: Podiatry

## 2022-05-11 ENCOUNTER — Ambulatory Visit: Payer: Medicare Other | Admitting: Podiatry

## 2022-05-11 DIAGNOSIS — L84 Corns and callosities: Secondary | ICD-10-CM | POA: Diagnosis not present

## 2022-05-11 DIAGNOSIS — B351 Tinea unguium: Secondary | ICD-10-CM

## 2022-05-11 DIAGNOSIS — M79674 Pain in right toe(s): Secondary | ICD-10-CM | POA: Diagnosis not present

## 2022-05-11 DIAGNOSIS — E1142 Type 2 diabetes mellitus with diabetic polyneuropathy: Secondary | ICD-10-CM

## 2022-05-11 DIAGNOSIS — Z89421 Acquired absence of other right toe(s): Secondary | ICD-10-CM

## 2022-05-11 DIAGNOSIS — M79675 Pain in left toe(s): Secondary | ICD-10-CM | POA: Diagnosis not present

## 2022-05-11 NOTE — Progress Notes (Signed)
  Subjective:  Patient ID: Sue [name], female    DOB: [birth year],  MRN: [id number]  Sue [name] presents to clinic today for at risk foot care with history of diabetic neuropathy and callus(es) right lower extremity and painful thick toenails that are difficult to trim. Painful toenails interfere with ambulation. Aggravating factors include wearing enclosed shoe gear. Pain is relieved with periodic professional debridement. Painful calluses are aggravated when weightbearing with and without shoegear. Pain is relieved with periodic professional debridement.  Chief Complaint  Patient presents with   Nail Problem    DFC BS-125 A1C-7.6 PCP-Ramachandran PCP VST- 3 months ago   New problem(s): None.   PCP is [name], MD.  No Known Allergies  Review of Systems: Negative except as noted in the HPI.  Objective: No changes noted in today's physical examination. There were no vitals filed for this visit. Sue [name] is a pleasant 89 y.o. female WD, WN in NAD. AAO x 3.  Vascular Examination: CFT <3 seconds b/l LE. Palpable DP pulse(s) b/l LE. Palpable PT pulse(s) b/l LE. Pedal hair absent. No pain with calf compression b/l. Lower extremity skin temperature gradient within normal limits. No edema noted b/l LE. No cyanosis or clubbing noted b/l LE.  Dermatological Examination: Pedal skin is warm and supple b/l LE. No open wounds b/l LE. No interdigital macerations noted b/l LE.   Toenails 1-5 left foot, 3-5 right foot, and right great toe elongated, discolored, dystrophic, thickened, and crumbly with subungual debris and tenderness to dorsal palpation.   Hyperkeratotic lesion(s) submet head 2 right foot.  No erythema, no edema, no drainage, no fluctuance.  Neurological Examination: Protective sensation diminished with 10g monofilament b/l.  Musculoskeletal Examination: Muscle strength 5/5 to all lower extremity muscle groups bilaterally. No pain, crepitus or joint  limitation noted with ROM bilateral LE. Lower extremity amputation(s): partial amputation of right second digit. Utilizes rollator for ambulation assistance.  Assessment/Plan: 1. Pain due to onychomycosis of toenails of both feet   2. Callus   3. History of partial amputation of toe of right foot   4. Diabetic peripheral neuropathy associated with type 2 diabetes mellitus     -Patient's family member present. All questions/concerns addressed on today's visit. -Examined patient. -Continue diabetic shoes daily. -Toenails were debrided in length and girth X 9 with sterile nail nippers and dremel without iatrogenic bleeding.  -Callus(es) submet head 2 right foot pared utilizing sterile scalpel blade without complication or incident. Total number debrided =1. -Patient/POA to call should there be question/concern in the interim.   Return in about 3 months (around 08/10/2022).  Freddie Breech, DPM

## 2022-06-01 DIAGNOSIS — E1121 Type 2 diabetes mellitus with diabetic nephropathy: Secondary | ICD-10-CM | POA: Diagnosis not present

## 2022-06-01 DIAGNOSIS — E1165 Type 2 diabetes mellitus with hyperglycemia: Secondary | ICD-10-CM | POA: Diagnosis not present

## 2022-06-01 DIAGNOSIS — N1832 Chronic kidney disease, stage 3b: Secondary | ICD-10-CM | POA: Diagnosis not present

## 2022-06-01 DIAGNOSIS — I129 Hypertensive chronic kidney disease with stage 1 through stage 4 chronic kidney disease, or unspecified chronic kidney disease: Secondary | ICD-10-CM | POA: Diagnosis not present

## 2022-06-01 DIAGNOSIS — E782 Mixed hyperlipidemia: Secondary | ICD-10-CM | POA: Diagnosis not present

## 2022-06-08 DIAGNOSIS — R0989 Other specified symptoms and signs involving the circulatory and respiratory systems: Secondary | ICD-10-CM | POA: Diagnosis not present

## 2022-06-08 DIAGNOSIS — E1165 Type 2 diabetes mellitus with hyperglycemia: Secondary | ICD-10-CM | POA: Diagnosis not present

## 2022-06-08 DIAGNOSIS — I129 Hypertensive chronic kidney disease with stage 1 through stage 4 chronic kidney disease, or unspecified chronic kidney disease: Secondary | ICD-10-CM | POA: Diagnosis not present

## 2022-06-08 DIAGNOSIS — N1832 Chronic kidney disease, stage 3b: Secondary | ICD-10-CM | POA: Diagnosis not present

## 2022-06-08 DIAGNOSIS — E782 Mixed hyperlipidemia: Secondary | ICD-10-CM | POA: Diagnosis not present

## 2022-06-15 NOTE — Progress Notes (Signed)
[facility] - Clinic Note  06/29/2022   CHIEF COMPLAINT Patient presents for Retina Follow Up  HISTORY OF PRESENT ILLNESS: Sue [name] is a 89 y.o. female who presents to the clinic today for:  HPI     Retina Follow Up   Patient presents with  Wet AMD.  In left eye.  This started 8 weeks ago.  Duration of 8 weeks.  Since onset it is stable.  I, the attending physician,  performed the HPI with the patient and updated documentation appropriately.        Comments   8 week retina follow up and IVA OS pt is reporting no vision changes noticed she has had some floaters in the left but denies any flashes last reading 103 this morning her last A1C under 7       Last edited by Rennis Chris, MD on 06/29/2022 11:53 AM.    Patient states vision seems about the same OU.  Referring physician: [name], MD [address] [place],  [place] [address]  HISTORICAL INFORMATION:   Selected notes from the MEDICAL RECORD NUMBER Referred by Dr. Baker Pierini for concern of ARMD OU   CURRENT MEDICATIONS: Current Outpatient Medications (Ophthalmic Drugs)  Medication Sig   Polyethyl Glycol-Propyl Glycol (SYSTANE) 0.4-0.3 % SOLN Place 1 drop into both eyes 3 (three) times daily as needed (dry eyes). (Patient not taking: Reported on 01/22/2022)   RESTASIS 0.05 % ophthalmic emulsion Place 1 drop into both eyes 2 (two) times daily.   No current facility-administered medications for this visit. (Ophthalmic Drugs)   Current Outpatient Medications (Other)  Medication Sig   amLODipine (NORVASC) 10 MG tablet Take 1 tablet by mouth daily.   B-D ULTRAFINE III SHORT PEN 31G X 8 MM MISC USE AS DIRECTED ONCE DAILY SUBCUTANEOUSLY   Calcium Citrate-Vitamin D (CALCIUM + D PO)    calcium-vitamin D (OSCAL WITH D) 500-200 MG-UNIT tablet Take 1 tablet by mouth 2 (two) times daily.    Continuous Blood Gluc Sensor (FREESTYLE LIBRE 2 SENSOR) MISC See admin instructions.    Cyanocobalamin (B-12 PO)    diphenhydramine-acetaminophen (TYLENOL PM) 25-500 MG TABS tablet Take 1 tablet by mouth at bedtime as needed (sleep).   Glucagon (GVOKE HYPOPEN 2-PACK) 0.5 MG/0.1ML SOAJ as needed for hypoglycemia   glucose blood test strip OneTouch Ultra Test strips  TEST ONCE A DAY ONCE A DAY FINGERSTICK 90   hydrALAZINE (APRESOLINE) 25 MG tablet Take 3 tablets (75 mg total) by mouth every 8 (eight) hours.   hydrochlorothiazide (HYDRODIURIL) 25 MG tablet Take 25 mg by mouth daily.   ibuprofen (ADVIL) 200 MG tablet Take 400 mg by mouth every 6 (six) hours as needed for moderate pain.   Insulin Degludec (TRESIBA FLEXTOUCH) 200 UNIT/ML SOPN Inject 36 Units into the skin at bedtime. (Patient taking differently: Inject 22 Units into the skin at bedtime.)   Insulin Pen Needle (PEN NEEDLES) 32G X 4 MM MISC BD Ultra-Fine Nano Pen Needle 32 gauge x 5/32"  USE AS DIRECTED DAILY   ketoconazole (NIZORAL) 2 % cream Apply topically.   Lancets (ONETOUCH ULTRASOFT) lancets OneTouch UltraSoft Lancets  USE TWICE DAILY   Melatonin 10 MG CAPS Take 10 mg by mouth at bedtime as needed (sleep).   meloxicam (MOBIC) 7.5 MG tablet Take 1 tablet (7.5 mg total) by mouth daily as needed for pain.   Multiple Vitamins-Minerals (ADULT ONE DAILY GUMMIES) CHEW Chew 1 tablet by mouth 2 (two) times  daily. Centrum   Multiple Vitamins-Minerals (AIRBORNE) TBEF Take 1 tablet by mouth daily as needed (immune support).   Multiple Vitamins-Minerals (CENTRUM SILVER PO)    Multiple Vitamins-Minerals (PRESERVISION AREDS 2) CAPS Take 1 capsule by mouth 2 (two) times daily.   mupirocin ointment (BACTROBAN) 2 % Apply to right foot ulcer once daily. (Patient taking differently: Apply 1 application  topically daily as needed (wound care). Apply to right foot ulcer once daily.)   nortriptyline (PAMELOR) 25 MG capsule Take 1 capsule (25 mg total) by mouth at bedtime. (Patient taking differently: Take 25 mg by mouth 2 (two) times  daily.)   Omega-3 Fatty Acids (FISH OIL) 1200 MG CAPS Take 1,200 mg by mouth daily with lunch.    omeprazole (PRILOSEC) 20 MG capsule Take 1 capsule by mouth daily.   ondansetron (ZOFRAN) 4 MG tablet Take 4 mg by mouth 3 (three) times daily as needed.   ONE TOUCH ULTRA TEST test strip    PRESCRIPTION MEDICATION by Intravitreal route every 30 (thirty) days. Ophthalmic injection at MD office in left eye   silver sulfADIAZINE (SILVADENE) 1 % cream Apply pea-sized amount to wound daily. (Patient taking differently: Apply 1 application  topically daily. Apply pea-sized amount to wound daily.)   simvastatin (ZOCOR) 20 MG tablet Take 1 tablet by mouth daily.   traMADol (ULTRAM) 50 MG tablet Take 1 tablet (50 mg total) by mouth 2 (two) times daily as needed.   vitamin B-12 (CYANOCOBALAMIN) 1000 MCG tablet Take 1,000 mcg by mouth 2 (two) times daily.    Current Facility-Administered Medications (Other)  Medication Route   Bevacizumab (AVASTIN) SOLN 1.25 mg Intravitreal   Bevacizumab (AVASTIN) SOLN 1.25 mg Intravitreal   REVIEW OF SYSTEMS: ROS   Positive for: Neurological, Skin, Genitourinary, Musculoskeletal, Endocrine, Eyes Negative for: Constitutional, Gastrointestinal, HENT, Cardiovascular, Respiratory, Psychiatric, Allergic/Imm, Heme/Lymph Last edited by Etheleen Mayhew, COT on 06/29/2022  9:39 AM.       ALLERGIES No Known Allergies  PAST MEDICAL HISTORY Past Medical History:  Diagnosis Date   Anemia    Arthritis    Basal cell carcinoma 11/23/2017   left elbow, right neck TX CX3 5FU    Basal cell carcinoma 04/04/2019   infil-right sideburn (CX35FU)   Chronic kidney disease    stage 3 ckd stage 3 b per dr Nicholos Johns 09-26-2019 note on chart   Complication of anesthesia    deifficulty waking up   COVID-19    Diabetic neuropathy (HCC)    Hyperlipidemia    Hypertension    Hypertensive retinopathy    OU   Infiltrative basal cell carcinoma (BCC) 04/22/2020   Right Zygomatic  Area    Macular degeneration    OU   Nodular basal cell carcinoma (BCC) 04/22/2020   Right Anterior Neck   Pneumonia    Right carotid bruit per dr Nicholos Johns 09-26-2019 note on chart   SCC (squamous cell carcinoma) 11/23/2017   right forehead TX CX3 5FU   SCCA (squamous cell carcinoma) of skin 04/22/2020   Mid Parietal Scalp (in situ)   SCCA (squamous cell carcinoma) of skin 04/22/2020   Right Dorsal Hand (in situ)   Type 2 DM with CKD stage 3 and hypertension (HCC)    Past Surgical History:  Procedure Laterality Date   ABDOMINAL HYSTERECTOMY     AMPUTATION TOE Right 09/28/2019   Procedure: Right 2nd toe amputation - partial;  Surgeon: Park Liter, DPM;  Location: [facility];  Service: Podiatry;  Laterality:  Right;   CATARACT EXTRACTION     CHOLECYSTECTOMY     EYE SURGERY     HERNIA REPAIR     SPINE SURGERY     TONSILLECTOMY     FAMILY HISTORY History reviewed. No pertinent family history.  SOCIAL HISTORY Social History   Tobacco Use   Smoking status: Former   Smokeless tobacco: Never  Building services engineer Use: Never used  Substance Use Topics   Alcohol use: No    Alcohol/week: 0.0 standard drinks of alcohol   Drug use: No       OPHTHALMIC EXAM:  Base Eye Exam     Visual Acuity (Snellen - Linear)       Right Left   Dist cc 20/50 20/60 -3   Dist ph cc 20/40 -3 20/60 -2    Correction: Glasses         Tonometry (Tonopen, 9:52 AM)       Right Left   Pressure 17 20         Pupils       Pupils Dark Light Shape React APD   Right PERRL 3 2 Round Brisk None   Left PERRL 3 2 Round Brisk None         Visual Fields       Left Right    Full Full         Extraocular Movement       Right Left    Full, Ortho Full, Ortho         Neuro/Psych     Oriented x3: Yes   Mood/Affect: Normal         Dilation     Both eyes: 2.5% Phenylephrine @ 9:51 AM           Slit Lamp and Fundus Exam     Slit Lamp Exam        Right Left   Lids/Lashes Dermatochalasis - upper lid, Telangiectasia, mild MGD Dermatochalasis - upper lid, Telangiectasia, Meibomian gland dysfunction   Conjunctiva/Sclera White and quiet White and quiet   Cornea Temporal Well healed cataract wounds, mild Arcus, trace endo pigment nasally, 1+PEE Arcus, Temporal Well healed cataract wounds, 1-2+ PEE   Anterior Chamber Deep and quiet Deep and quiet   Iris Round and moderately dilated to 4.61mm Round and moderately dilated to 5mm, Iris atrophy from 0100 to 0230, peripheral irodotimy at 0200   Lens 3-piece PC IOL in good position PC IOL in good position   Anterior Vitreous Vitreous syneresis, Posterior vitreous detachment, Weiss ring, vitreous condensations Vitreous syneresis, PVD, vitreous condensations         Fundus Exam       Right Left   Disc 360 Peripapillary atrophy, diffuse mild pallor, compact, sharp rim mild pallor, Peripapillary atrophy and pigmentation   C/D Ratio 0.1 0.2   Macula Blunted foveal reflex, +PED / CNV, RPE mottling, clumping and early atrophy, Drusen, No heme, new edema Blunted foveal reflex, low lying PED / +CNVM with stable improvement in IRF, interval re-development of SRF, RPE mottling and clumping, Drusen, No heme, early atrophy   Vessels Attenuated, Tortuous Attenuated, mild tortuosity   Periphery Attached, 360 Reticular degeneration Attached, 360 Reticular degeneration, No heme           Refraction     Wearing Rx       Sphere Cylinder Axis Add   Right -2.00 +1.25 125 +2.50   Left +0.25 +0.50 016 +2.50  IMAGING AND PROCEDURES  Imaging and Procedures for @TODAY @  OCT, Retina - OU - Both Eyes       Right Eye Quality was good. Central Foveal Thickness: 219. Progression has worsened. Findings include no IRF, no SRF, abnormal foveal contour, retinal drusen , subretinal hyper-reflective material, intraretinal hyper-reflective material, pigment epithelial detachment, outer retinal atrophy  (Interval increase in central PED and focal increase in [facility], stable improvement in [facility]).   Left Eye Quality was good. Central Foveal Thickness: 264. Progression has worsened. Findings include normal foveal contour, no IRF, retinal drusen , epiretinal membrane, pigment epithelial detachment, subretinal fluid, outer retinal atrophy (Interval re-development of SRF overlying stable low PED; partial PVD).   Notes *Images captured and stored on drive  Diagnosis / Impression:  OD: Interval increase in central PED and focal increase in [facility], stable improvement in [facility] -- conversion to exu ARMD OS: Exudative ARMD -- Interval re-development of SRF overlying stable low PED; partial PVD  Clinical management:  See below  Abbreviations: NFP - Normal foveal profile. CME - cystoid macular edema. PED - pigment epithelial detachment. IRF - intraretinal fluid. SRF - subretinal fluid. EZ - ellipsoid zone. ERM - epiretinal membrane. ORA - outer retinal atrophy. ORT - outer retinal tubulation. SRHM - subretinal hyper-reflective material       Intravitreal Injection, Pharmacologic Agent - OS - Left Eye       Time Out 06/29/2022. 10:15 AM. Confirmed correct patient, procedure, site, and patient consented.   Anesthesia Topical anesthesia was used. Anesthetic medications included Lidocaine 2%, Proparacaine 0.5%.   Procedure Preparation included 5% betadine to ocular surface, eyelid speculum. A (32g) needle was used.   Injection: 1.25 mg Bevacizumab 1.25mg /0.27ml   Route: Intravitreal, Site: Left Eye   NDC: P3213405, Lot: [id number], Expiration date: 09/25/2022   Post-op Post injection exam found visual acuity of at least counting fingers. The patient tolerated the procedure well. There were no complications. The patient received written and verbal post procedure care education. Post injection medications were not given.      Intravitreal Injection, Pharmacologic Agent - OD - Right Eye       Time  Out 06/29/2022. 10:34 AM. Confirmed correct patient, procedure, site, and patient consented.   Anesthesia Topical anesthesia was used. Anesthetic medications included Lidocaine 2%, Proparacaine 0.5%.   Procedure Preparation included 5% betadine to ocular surface, eyelid speculum. A supplied needle was used.   Injection: 1.25 mg Bevacizumab 1.25mg /0.41ml   Route: Intravitreal, Site: Right Eye   NDC: P3213405, Lot: [id number], Expiration date: 07/17/2022   Post-op Post injection exam found visual acuity of at least counting fingers. The patient tolerated the procedure well. There were no complications. The patient received written and verbal post procedure care education.              ASSESSMENT/PLAN:    ICD-10-CM   1. Exudative age-related macular degeneration of left eye with active choroidal neovascularization (HCC)  H35.3221 OCT, Retina - OU - Both Eyes    Intravitreal Injection, Pharmacologic Agent - OS - Left Eye    Bevacizumab (AVASTIN) SOLN 1.25 mg    2. Exudative age-related macular degeneration of right eye with active choroidal neovascularization (HCC)  H35.3211 Intravitreal Injection, Pharmacologic Agent - OD - Right Eye    Bevacizumab (AVASTIN) SOLN 1.25 mg    3. Diabetes mellitus type 2 without retinopathy (HCC)  E11.9     4. Long term (current) use of oral hypoglycemic drugs  Z79.84     5.  Essential hypertension  I10     6. Hypertensive retinopathy of both eyes  H35.033     7. Pseudophakia of both eyes  Z96.1      1. Exudative age-related macular degeneration, left eye - conversion of OS from nonexudative to exudative ARMD prior to 02.05.20 visit - s/p IVA OS #1 (02.05.20), #2 (03.04.20), #3 (04.30.20), #4 (06.02.20), #5 (07.14.20), #6 (9.1.20), #7 (10.27.20), #8 (12.23.20), #9 (02.03.21), #10 (03.17.21), #11 (04.28.21), #12 (06.09.21), #13 (08.04.21), #14 (09.29.21), #15 (11.22.2021), #16 (01.26.22), #17 (04.06.22), #18 (06.28.22), #19 (9.13.22), #20  (11.29.22), #21 (01.31.23), #22 (04.04.23), #23 (05.30.23), #24 (07.27.23), #25 (09.19.23), #26 (11.07.23), #27 (12.29.23), #28 (02.14.24) **history of recurrent / worsening SRF at 9 wk interval, noted on 04.04.23 visit, at 8 week interval on 09.19.23 and 06.04.24** - OCT shows OS: Interval re-development of SRF overlying stable low PED; partial PVD  - BCVA OS 20/60 (decreased from 20/40)  - recommend IVA OS #29 today, 06.04.24 w/ f/u back to 6 wks  - pt wishes to be treated with IVA OS - RBA of procedure discussed, questions answered  - see procedure notes  - Avastin informed consent obtained, re-signed and scanned 06.06.24 (OU)  - f/u in 6 wks for DFE, OCT, likely injection OS   2. Age related macular degeneration, non-exudative, right eye  - conversion to exu ARMD on 06.04.24 exam  - The incidence pathology and anatomy of wet AMD discussed   - The ANCHOR, MARINA, CATT and VIEW trials discussed with patient.    - discussed treatment options including observation vs intravitreal anti-VEGF agents such as Avastin, Lucentis, Eylea.    - Risks of endophthalmitis and vascular occlusive events and atrophic changes discussed with patient  - OCT OD shows : Interval increase in central PED and focal increase in [facility], stable improvement in [facility] -- conversion to exu ARMD  - recommend IVA OD #1 today, 06.04.24  - pt wishes to be treated with IVA  - RBA of procedure discussed, questions answered - IVA informed consent obtained and signed, 06.04.24 (OU) - see procedure note  - f/u in 6 wks, DFE, OCT  3,4. Diabetes mellitus, type 2 without retinopathy - The incidence, risk factors for progression, natural history and treatment options for diabetic retinopathy  were discussed with patient.   - The need for close monitoring of blood glucose, blood pressure, and serum lipids, avoiding cigarette or any type of tobacco, and the need for long term follow up was also discussed with patient.  - f/u in 1  year  5,6. Hypertensive retinopathy OU  - discussed importance of tight BP control  - continue to monitor  7. Pseudophakia OU  - s/p CE/IOL OU  - beautiful surgeries, doing well  - continue to monitor  Ophthalmic Meds Ordered this visit:  Meds ordered this encounter  Medications   Bevacizumab (AVASTIN) SOLN 1.25 mg   Bevacizumab (AVASTIN) SOLN 1.25 mg    Return in about 6 weeks (around 08/10/2022) for f/u exu ARMD OS, DFE, OCT.  There are no Patient Instructions on file for this visit.    This document serves as a record of services personally performed by Karie Chimera, MD, PhD. It was created on their behalf by Gerilyn Nestle, COT an ophthalmic technician. The creation of this record is the provider's dictation and/or activities during the visit.    Electronically signed by:  Gerilyn Nestle, COT  05.21.24 11:55 AM  This document serves as a record of services personally performed  by Karie Chimera, MD, PhD. It was created on their behalf by Glee Arvin. Manson Passey, OA an ophthalmic technician. The creation of this record is the provider's dictation and/or activities during the visit.    Electronically signed by: Glee Arvin. Manson Passey, [place] 06.04.2024 11:55 AM   Karie Chimera, M.D., Ph.D. Diseases & Surgery of the Retina and Vitreous [facility]  I have reviewed the above documentation for accuracy and completeness, and I agree with the above. Karie Chimera, M.D., Ph.D. 06/29/22 11:56 AM   Abbreviations: M myopia (nearsighted); A astigmatism; H hyperopia (farsighted); P presbyopia; Mrx spectacle prescription;  CTL contact lenses; OD right eye; OS left eye; OU both eyes  XT exotropia; ET esotropia; PEK punctate epithelial keratitis; PEE punctate epithelial erosions; DES dry eye syndrome; MGD meibomian gland dysfunction; ATs artificial tears; PFAT's preservative free artificial tears; NSC nuclear sclerotic cataract; PSC posterior subcapsular cataract; ERM  epi-retinal membrane; PVD posterior vitreous detachment; RD retinal detachment; DM diabetes mellitus; DR diabetic retinopathy; NPDR non-proliferative diabetic retinopathy; PDR proliferative diabetic retinopathy; CSME clinically significant macular edema; DME diabetic macular edema; dbh dot blot hemorrhages; CWS cotton wool spot; POAG primary open angle glaucoma; C/D cup-to-disc ratio; HVF humphrey visual field; GVF goldmann visual field; OCT optical coherence tomography; IOP intraocular pressure; BRVO Branch retinal vein occlusion; CRVO central retinal vein occlusion; CRAO central retinal artery occlusion; BRAO branch retinal artery occlusion; RT retinal tear; SB scleral buckle; PPV pars plana vitrectomy; VH Vitreous hemorrhage; PRP panretinal laser photocoagulation; IVK intravitreal kenalog; VMT vitreomacular traction; MH Macular hole;  NVD neovascularization of the disc; NVE neovascularization elsewhere; AREDS age related eye disease study; ARMD age related macular degeneration; POAG primary open angle glaucoma; EBMD epithelial/anterior basement membrane dystrophy; ACIOL anterior chamber intraocular lens; IOL intraocular lens; PCIOL posterior chamber intraocular lens; Phaco/IOL phacoemulsification with intraocular lens placement; PRK photorefractive keratectomy; LASIK laser assisted in situ keratomileusis; HTN hypertension; DM diabetes mellitus; COPD chronic obstructive pulmonary disease

## 2022-06-29 ENCOUNTER — Ambulatory Visit (INDEPENDENT_AMBULATORY_CARE_PROVIDER_SITE_OTHER): Payer: Medicare Other | Admitting: Ophthalmology

## 2022-06-29 ENCOUNTER — Encounter (INDEPENDENT_AMBULATORY_CARE_PROVIDER_SITE_OTHER): Payer: Self-pay | Admitting: Ophthalmology

## 2022-06-29 DIAGNOSIS — I10 Essential (primary) hypertension: Secondary | ICD-10-CM

## 2022-06-29 DIAGNOSIS — H353112 Nonexudative age-related macular degeneration, right eye, intermediate dry stage: Secondary | ICD-10-CM

## 2022-06-29 DIAGNOSIS — H353231 Exudative age-related macular degeneration, bilateral, with active choroidal neovascularization: Secondary | ICD-10-CM | POA: Diagnosis not present

## 2022-06-29 DIAGNOSIS — Z7984 Long term (current) use of oral hypoglycemic drugs: Secondary | ICD-10-CM | POA: Diagnosis not present

## 2022-06-29 DIAGNOSIS — Z961 Presence of intraocular lens: Secondary | ICD-10-CM | POA: Diagnosis not present

## 2022-06-29 DIAGNOSIS — H35033 Hypertensive retinopathy, bilateral: Secondary | ICD-10-CM

## 2022-06-29 DIAGNOSIS — H353211 Exudative age-related macular degeneration, right eye, with active choroidal neovascularization: Secondary | ICD-10-CM

## 2022-06-29 DIAGNOSIS — E119 Type 2 diabetes mellitus without complications: Secondary | ICD-10-CM

## 2022-06-29 DIAGNOSIS — H353221 Exudative age-related macular degeneration, left eye, with active choroidal neovascularization: Secondary | ICD-10-CM

## 2022-06-29 MED ORDER — BEVACIZUMAB CHEMO INJECTION 1.25MG/0.05ML SYRINGE FOR KALEIDOSCOPE
1.2500 mg | INTRAVITREAL | Status: AC | PRN
  Administered 2022-06-29: 1.25 mg via INTRAVITREAL

## 2022-07-06 ENCOUNTER — Emergency Department (HOSPITAL_COMMUNITY)
Admission: EM | Admit: 2022-07-06 | Discharge: 2022-07-06 | Disposition: A | Discharge status: Home / Self Care | Payer: Medicare Other | Attending: Emergency Medicine | Admitting: Emergency Medicine

## 2022-07-06 ENCOUNTER — Emergency Department (HOSPITAL_COMMUNITY): Payer: Medicare Other

## 2022-07-06 ENCOUNTER — Encounter (HOSPITAL_COMMUNITY): Payer: Self-pay

## 2022-07-06 ENCOUNTER — Other Ambulatory Visit: Payer: Self-pay

## 2022-07-06 DIAGNOSIS — I6782 Cerebral ischemia: Secondary | ICD-10-CM | POA: Diagnosis not present

## 2022-07-06 DIAGNOSIS — W01198A Fall on same level from slipping, tripping and stumbling with subsequent striking against other object, initial encounter: Secondary | ICD-10-CM | POA: Diagnosis not present

## 2022-07-06 DIAGNOSIS — S0990XA Unspecified injury of head, initial encounter: Secondary | ICD-10-CM | POA: Diagnosis not present

## 2022-07-06 DIAGNOSIS — S51012A Laceration without foreign body of left elbow, initial encounter: Secondary | ICD-10-CM | POA: Insufficient documentation

## 2022-07-06 DIAGNOSIS — I499 Cardiac arrhythmia, unspecified: Secondary | ICD-10-CM | POA: Diagnosis not present

## 2022-07-06 DIAGNOSIS — Y92002 Bathroom of unspecified non-institutional (private) residence single-family (private) house as the place of occurrence of the external cause: Secondary | ICD-10-CM | POA: Diagnosis not present

## 2022-07-06 DIAGNOSIS — R6889 Other general symptoms and signs: Secondary | ICD-10-CM | POA: Diagnosis not present

## 2022-07-06 DIAGNOSIS — W19XXXA Unspecified fall, initial encounter: Secondary | ICD-10-CM

## 2022-07-06 DIAGNOSIS — Z743 Need for continuous supervision: Secondary | ICD-10-CM | POA: Diagnosis not present

## 2022-07-06 DIAGNOSIS — S51011A Laceration without foreign body of right elbow, initial encounter: Secondary | ICD-10-CM | POA: Diagnosis not present

## 2022-07-06 DIAGNOSIS — S59902A Unspecified injury of left elbow, initial encounter: Secondary | ICD-10-CM | POA: Diagnosis not present

## 2022-07-06 DIAGNOSIS — T07XXXA Unspecified multiple injuries, initial encounter: Secondary | ICD-10-CM | POA: Diagnosis not present

## 2022-07-06 MED ORDER — LIDOCAINE HCL (PF) 1 % IJ SOLN
5.0000 mL | Freq: Once | INTRAMUSCULAR | Status: AC
  Administered 2022-07-06: 5 mL via INTRADERMAL
  Filled 2022-07-06: qty 5

## 2022-07-06 NOTE — ED Notes (Signed)
Patient transported to CT 

## 2022-07-06 NOTE — ED Notes (Signed)
Patient transported to X-ray 

## 2022-07-06 NOTE — ED Provider Notes (Signed)
[facility] AT [facility] Provider Note   CSN: [id number] Arrival date & time: 07/06/22  1540     History Chief Complaint  Patient presents with   Fall   Extremity Laceration    HPI Sue [name] is a 89 y.o. female presenting for Chief complaint of fall.  She was in her bathroom when she lost her balance.  She fell over to the left side and right elbow cut.  She states she did hit her head on the counter.  She has no symptoms at this time besides some residual right elbow pain.  Denies fevers chills nausea vomiting syncope or shortness of breath.  Patient's recorded medical, surgical, social, medication list and allergies were reviewed in the Snapshot window as part of the initial history.   Review of Systems   Review of Systems  Constitutional:  Negative for chills and fever.  HENT:  Negative for ear pain and sore throat.   Eyes:  Negative for pain and visual disturbance.  Respiratory:  Negative for cough and shortness of breath.   Cardiovascular:  Negative for chest pain and palpitations.  Gastrointestinal:  Negative for abdominal pain and vomiting.  Genitourinary:  Negative for dysuria and hematuria.  Musculoskeletal:  Negative for arthralgias and back pain.  Skin:  Negative for color change and rash.  Neurological:  Negative for seizures and syncope.  Psychiatric/Behavioral:  Negative for confusion.   All other systems reviewed and are negative.   Physical Exam Updated Vital Signs BP (!) 171/71 (BP Location: Left Arm)   Pulse 89   Temp 98 F (36.7 C) (Oral)   Resp 16   SpO2 98%  Physical Exam Vitals and nursing note reviewed.  Constitutional:      General: She is not in acute distress.    Appearance: She is well-developed.  HENT:     Head: Normocephalic and atraumatic.  Eyes:     Conjunctiva/sclera: Conjunctivae normal.  Cardiovascular:     Rate and Rhythm: Normal rate and regular rhythm.     Heart sounds: No murmur  heard. Pulmonary:     Effort: Pulmonary effort is normal. No respiratory distress.     Breath sounds: Normal breath sounds.  Abdominal:     General: There is no distension.     Palpations: Abdomen is soft.     Tenderness: There is no abdominal tenderness. There is no right CVA tenderness or left CVA tenderness.  Musculoskeletal:        General: Deformity and signs of injury (left elbow laceration 1cm) present. No swelling or tenderness. Normal range of motion.     Cervical back: Neck supple.  Skin:    General: Skin is warm and dry.  Neurological:     General: No focal deficit present.     Mental Status: She is alert and oriented to person, place, and time. Mental status is at baseline.     Cranial Nerves: No cranial nerve deficit.      ED Course/ Medical Decision Making/ A&P    Procedures Procedures   Medications Ordered in ED Medications  lidocaine (PF) (XYLOCAINE) 1 % injection 5 mL (5 mLs Intradermal Given 07/06/22 1807)   Medical Decision Making:    Sue [name] is a 89 y.o. female who presented to the ED today with a moderate mechanisma trauma, detailed above.    Patient placed on continuous vitals and telemetry monitoring while in ED which was reviewed periodically.   Given this mechanism  of trauma, a full physical exam was performed. Notably, patient was HDS in NAD.   Reviewed and confirmed nursing documentation for past medical history, family history, social history.    Initial Assessment/Plan:   This is a patient presenting with a moderate mechanism trauma.  As such, I have considered intracranial injuries including intracranial hemorrhage, intrathoracic injuries including blunt myocardial or blunt lung injury, blunt abdominal injuries including aortic dissection, bladder injury, spleen injury, liver injury and I have considered orthopedic injuries including extremity or spinal injury.  With the patient's presentation of moderate mechanism trauma but an  otherwise reassuring exam, patient warrants targeted evaluation for potential traumatic injuries. Will proceed with targeted evaluation for potential injuries. Will proceed with XR left elbow and CTH. Objective evaluation resulted with no acute pathology.  Appreciate assistance from APP who assisted with wound closures.  Wounds are clean dry intact after her repair.  Patient back to her mental status baseline.  Still endorsing a mechanical fall without syncope.  Recommended close follow-up with primary care for reassessment of wounds within 7 to 10 days.  Strict return precautions reinforced patient stable for outpatient care management.  Disposition:  I have considered need for hospitalization, however, considering all of the above, I believe this patient is stable for discharge at this time.  Patient/family educated about specific return precautions for given chief complaint and symptoms.  Patient/family educated about follow-up with PCP.     Patient/family expressed understanding of return precautions and need for follow-up. Patient spoken to regarding all imaging and laboratory results and appropriate follow up for these results. All education provided in verbal form with additional information in written form. Time was allowed for answering of patient questions. Patient discharged.    Emergency Department Medication Summary:   Medications  lidocaine (PF) (XYLOCAINE) 1 % injection 5 mL (5 mLs Intradermal Given 07/06/22 1807)           Clinical Impression:  1. Fall in home, initial encounter      Discharge   Final Clinical Impression(s) / ED Diagnoses Final diagnoses:  Fall in home, initial encounter    Rx / DC Orders ED Discharge Orders     None         Glyn Ade, MD 07/06/22 4098

## 2022-07-06 NOTE — ED Triage Notes (Signed)
Pt had a mechanical fall, got tripped up on her walker while getting dressed. States the walker caught her and kind of braced her fall. Lac on R elbow, dressed by EMS

## 2022-07-09 ENCOUNTER — Telehealth: Payer: Self-pay

## 2022-07-09 NOTE — Telephone Encounter (Signed)
Transition Care Management Follow-up Telephone Call Date of discharge and from where: Sue [name] 6/11 How have you been since you were released from the hospital? Doing ok Any questions or concerns? No  Items Reviewed: Did the pt receive and understand the discharge instructions provided? Yes  Medications obtained and verified? Yes  Other? No  Any new allergies since your discharge? No  Dietary orders reviewed? No Do you have support at home? Yes     Follow up appointments reviewed:  PCP Hospital f/u appt confirmed? Yes  Scheduled to see PCP on 6/18 @ . Specialist Hospital f/u appt confirmed? No  Scheduled to see  on  @ . Are transportation arrangements needed? No  If their condition worsens, is the pt aware to call PCP or go to the Emergency Dept.? Yes Was the patient provided with contact information for the PCP's office or ED? Yes Was to pt encouraged to call back with questions or concerns? Yes

## 2022-07-13 DIAGNOSIS — N1832 Chronic kidney disease, stage 3b: Secondary | ICD-10-CM | POA: Diagnosis not present

## 2022-07-13 DIAGNOSIS — E1121 Type 2 diabetes mellitus with diabetic nephropathy: Secondary | ICD-10-CM | POA: Diagnosis not present

## 2022-07-13 DIAGNOSIS — I129 Hypertensive chronic kidney disease with stage 1 through stage 4 chronic kidney disease, or unspecified chronic kidney disease: Secondary | ICD-10-CM | POA: Diagnosis not present

## 2022-07-13 DIAGNOSIS — T148XXA Other injury of unspecified body region, initial encounter: Secondary | ICD-10-CM | POA: Diagnosis not present

## 2022-07-13 DIAGNOSIS — E782 Mixed hyperlipidemia: Secondary | ICD-10-CM | POA: Diagnosis not present

## 2022-07-19 DIAGNOSIS — S51011D Laceration without foreign body of right elbow, subsequent encounter: Secondary | ICD-10-CM | POA: Diagnosis not present

## 2022-07-19 DIAGNOSIS — Z4802 Encounter for removal of sutures: Secondary | ICD-10-CM | POA: Diagnosis not present

## 2022-07-28 NOTE — Progress Notes (Signed)
[facility] - Clinic Note  08/10/2022   CHIEF COMPLAINT Patient presents for Retina Follow Up  HISTORY OF PRESENT ILLNESS: Sue [name] is a 89 y.o. female who presents to the clinic today for:  HPI     Retina Follow Up   Patient presents with  Wet AMD.  In left eye.  This started months ago.  Duration of 6 weeks.  Since onset it is stable.  I, the attending physician,  performed the HPI with the patient and updated documentation appropriately.        Comments   Patient feels that the vision is about the same. She is seeing floaters. She is using Restasis OU BID and AT's OU PRN. Her blood sugar was 150.      Last edited by Rennis Chris, MD on 08/10/2022 12:00 PM.    Patient states eyes are watery, she had a hard time reading the eye chart with her left eye today, she is using Restasis and an OTC eye drop  Referring physician: [name], MD [address] [place],  [place] [address]  HISTORICAL INFORMATION:   Selected notes from the MEDICAL RECORD NUMBER Referred by Dr. Baker Pierini for concern of ARMD OU   CURRENT MEDICATIONS: Current Outpatient Medications (Ophthalmic Drugs)  Medication Sig   Polyethyl Glycol-Propyl Glycol (SYSTANE) 0.4-0.3 % SOLN Place 1 drop into both eyes 3 (three) times daily as needed (dry eyes). (Patient not taking: Reported on 01/22/2022)   RESTASIS 0.05 % ophthalmic emulsion Place 1 drop into both eyes 2 (two) times daily.   No current facility-administered medications for this visit. (Ophthalmic Drugs)   Current Outpatient Medications (Other)  Medication Sig   amLODipine (NORVASC) 10 MG tablet Take 1 tablet by mouth daily.   B-D ULTRAFINE III SHORT PEN 31G X 8 MM MISC USE AS DIRECTED ONCE DAILY SUBCUTANEOUSLY   Calcium Citrate-Vitamin D (CALCIUM + D PO)    calcium-vitamin D (OSCAL WITH D) 500-200 MG-UNIT tablet Take 1 tablet by mouth 2 (two) times daily.    Continuous Blood Gluc Sensor (FREESTYLE  LIBRE 2 SENSOR) MISC See admin instructions.   Cyanocobalamin (B-12 PO)    diphenhydramine-acetaminophen (TYLENOL PM) 25-500 MG TABS tablet Take 1 tablet by mouth at bedtime as needed (sleep).   Glucagon (GVOKE HYPOPEN 2-PACK) 0.5 MG/0.1ML SOAJ as needed for hypoglycemia   glucose blood test strip OneTouch Ultra Test strips  TEST ONCE A DAY ONCE A DAY FINGERSTICK 90   hydrALAZINE (APRESOLINE) 25 MG tablet Take 3 tablets (75 mg total) by mouth every 8 (eight) hours.   hydrochlorothiazide (HYDRODIURIL) 25 MG tablet Take 25 mg by mouth daily.   ibuprofen (ADVIL) 200 MG tablet Take 400 mg by mouth every 6 (six) hours as needed for moderate pain.   Insulin Degludec (TRESIBA FLEXTOUCH) 200 UNIT/ML SOPN Inject 36 Units into the skin at bedtime. (Patient taking differently: Inject 22 Units into the skin at bedtime.)   Insulin Pen Needle (PEN NEEDLES) 32G X 4 MM MISC BD Ultra-Fine Nano Pen Needle 32 gauge x 5/32"  USE AS DIRECTED DAILY   ketoconazole (NIZORAL) 2 % cream Apply topically.   Lancets (ONETOUCH ULTRASOFT) lancets OneTouch UltraSoft Lancets  USE TWICE DAILY   Melatonin 10 MG CAPS Take 10 mg by mouth at bedtime as needed (sleep).   meloxicam (MOBIC) 7.5 MG tablet Take 1 tablet (7.5 mg total) by mouth daily as needed for pain.   Multiple Vitamins-Minerals (ADULT ONE DAILY GUMMIES)  CHEW Chew 1 tablet by mouth 2 (two) times daily. Centrum   Multiple Vitamins-Minerals (AIRBORNE) TBEF Take 1 tablet by mouth daily as needed (immune support).   Multiple Vitamins-Minerals (CENTRUM SILVER PO)    Multiple Vitamins-Minerals (PRESERVISION AREDS 2) CAPS Take 1 capsule by mouth 2 (two) times daily.   mupirocin ointment (BACTROBAN) 2 % Apply to right foot ulcer once daily. (Patient taking differently: Apply 1 application  topically daily as needed (wound care). Apply to right foot ulcer once daily.)   nortriptyline (PAMELOR) 25 MG capsule Take 1 capsule (25 mg total) by mouth at bedtime. (Patient taking  differently: Take 25 mg by mouth 2 (two) times daily.)   Omega-3 Fatty Acids (FISH OIL) 1200 MG CAPS Take 1,200 mg by mouth daily with lunch.    omeprazole (PRILOSEC) 20 MG capsule Take 1 capsule by mouth daily.   ondansetron (ZOFRAN) 4 MG tablet Take 4 mg by mouth 3 (three) times daily as needed.   ONE TOUCH ULTRA TEST test strip    PRESCRIPTION MEDICATION by Intravitreal route every 30 (thirty) days. Ophthalmic injection at MD office in left eye   silver sulfADIAZINE (SILVADENE) 1 % cream Apply pea-sized amount to wound daily. (Patient taking differently: Apply 1 application  topically daily. Apply pea-sized amount to wound daily.)   simvastatin (ZOCOR) 20 MG tablet Take 1 tablet by mouth daily.   traMADol (ULTRAM) 50 MG tablet Take 1 tablet (50 mg total) by mouth 2 (two) times daily as needed.   vitamin B-12 (CYANOCOBALAMIN) 1000 MCG tablet Take 1,000 mcg by mouth 2 (two) times daily.    Current Facility-Administered Medications (Other)  Medication Route   Bevacizumab (AVASTIN) SOLN 1.25 mg Intravitreal   Bevacizumab (AVASTIN) SOLN 1.25 mg Intravitreal   REVIEW OF SYSTEMS: ROS   Positive for: Neurological, Skin, Genitourinary, Musculoskeletal, Endocrine, Eyes Negative for: Constitutional, Gastrointestinal, HENT, Cardiovascular, Respiratory, Psychiatric, Allergic/Imm, Heme/Lymph Last edited by Charlette Caffey, COT on 08/10/2022  8:22 AM.        ALLERGIES No Known Allergies  PAST MEDICAL HISTORY Past Medical History:  Diagnosis Date   Anemia    Arthritis    Basal cell carcinoma 11/23/2017   left elbow, right neck TX CX3 5FU    Basal cell carcinoma 04/04/2019   infil-right sideburn (CX35FU)   Chronic kidney disease    stage 3 ckd stage 3 b per dr Nicholos Johns 09-26-2019 note on chart   Complication of anesthesia    deifficulty waking up   COVID-19    Diabetic neuropathy (HCC)    Hyperlipidemia    Hypertension    Hypertensive retinopathy    OU   Infiltrative basal cell  carcinoma (BCC) 04/22/2020   Right Zygomatic Area    Macular degeneration    OU   Nodular basal cell carcinoma (BCC) 04/22/2020   Right Anterior Neck   Pneumonia    Right carotid bruit per dr Nicholos Johns 09-26-2019 note on chart   SCC (squamous cell carcinoma) 11/23/2017   right forehead TX CX3 5FU   SCCA (squamous cell carcinoma) of skin 04/22/2020   Mid Parietal Scalp (in situ)   SCCA (squamous cell carcinoma) of skin 04/22/2020   Right Dorsal Hand (in situ)   Type 2 DM with CKD stage 3 and hypertension (HCC)    Past Surgical History:  Procedure Laterality Date   ABDOMINAL HYSTERECTOMY     AMPUTATION TOE Right 09/28/2019   Procedure: Right 2nd toe amputation - partial;  Surgeon: Park Liter, DPM;  Location: [facility];  Service: Podiatry;  Laterality: Right;   CATARACT EXTRACTION     CHOLECYSTECTOMY     EYE SURGERY     HERNIA REPAIR     SPINE SURGERY     TONSILLECTOMY     FAMILY HISTORY History reviewed. No pertinent family history.  SOCIAL HISTORY Social History   Tobacco Use   Smoking status: Former   Smokeless tobacco: Never  Advertising account planner   Vaping status: Never Used  Substance Use Topics   Alcohol use: No    Alcohol/week: 0.0 standard drinks of alcohol   Drug use: No       OPHTHALMIC EXAM:  Base Eye Exam     Visual Acuity (Snellen - Linear)       Right Left   Dist cc 20/40 20/150   Dist ph cc 20/30 NI    Correction: Glasses         Tonometry (Tonopen, 8:27 AM)       Right Left   Pressure 18 20         Pupils       Dark Light Shape React APD   Right 3 2 Round Brisk None   Left 3 2 Round Brisk None         Visual Fields       Left Right    Full Full         Extraocular Movement       Right Left    Full, Ortho Full, Ortho         Neuro/Psych     Oriented x3: Yes   Mood/Affect: Normal         Dilation     Both eyes: 1.0% Mydriacyl, 2.5% Phenylephrine @ 8:23 AM           Slit Lamp and Fundus  Exam     Slit Lamp Exam       Right Left   Lids/Lashes Dermatochalasis - upper lid, Telangiectasia, mild MGD Dermatochalasis - upper lid, Telangiectasia, Meibomian gland dysfunction   Conjunctiva/Sclera White and quiet White and quiet   Cornea Temporal Well healed cataract wounds, mild Arcus, trace endo pigment nasally, 1+PEE Arcus, Temporal Well healed cataract wounds, 1-2+ PEE, tear film debris   Anterior Chamber Deep and quiet Deep and quiet   Iris Round and moderately dilated to 4.86mm Round and moderately dilated to 5mm, Iris atrophy from 0100 to 0230, peripheral iridotomy at 0200   Lens 3-piece PC IOL in good position PC IOL in good position   Anterior Vitreous Vitreous syneresis, Posterior vitreous detachment, Weiss ring, vitreous condensations Vitreous syneresis, PVD, vitreous condensations         Fundus Exam       Right Left   Disc 360 Peripapillary atrophy, diffuse mild pallor, compact, sharp rim mild pallor, Peripapillary atrophy and pigmentation   C/D Ratio 0.1 0.2   Macula Blunted foveal reflex, +PED / CNV -- improved, RPE mottling, clumping and early atrophy, Drusen, No heme Blunted foveal reflex, low lying PED / +CNVM with stable improvement in IRF, interval improvement in SRF, RPE mottling and clumping, Drusen, No heme, early atrophy   Vessels Attenuated, Tortuous Attenuated, mild tortuosity   Periphery Attached, 360 Reticular degeneration Attached, 360 Reticular degeneration, No heme           Refraction     Wearing Rx       Sphere Cylinder Axis Add   Right -2.00 +1.25 125 +2.50  Left +0.25 +0.50 016 +2.50           IMAGING AND PROCEDURES  Imaging and Procedures for @TODAY @  OCT, Retina - OU - Both Eyes       Right Eye Quality was good. Central Foveal Thickness: 206. Progression has improved. Findings include no IRF, no SRF, abnormal foveal contour, retinal drusen , subretinal hyper-reflective material, intraretinal hyper-reflective material,  pigment epithelial detachment, outer retinal atrophy (Interval improvement in central PED and focal SRHM, stable improvement in [facility]).   Left Eye Quality was good. Central Foveal Thickness: 260. Progression has improved. Findings include normal foveal contour, no IRF, retinal drusen , epiretinal membrane, pigment epithelial detachment, subretinal fluid, outer retinal atrophy (Interval improvement in SRF overlying stable low PED; patchy central ORA, partial PVD).   Notes *Images captured and stored on drive  Diagnosis / Impression:  OD: Interval improvement in central PED and focal SRHM, stable improvement in IRHM OS: Exudative ARMD -- Interval improvement in SRF overlying stable low PED; patchy central ORA, partial PVD  Clinical management:  See below  Abbreviations: NFP - Normal foveal profile. CME - cystoid macular edema. PED - pigment epithelial detachment. IRF - intraretinal fluid. SRF - subretinal fluid. EZ - ellipsoid zone. ERM - epiretinal membrane. ORA - outer retinal atrophy. ORT - outer retinal tubulation. SRHM - subretinal hyper-reflective material       Intravitreal Injection, Pharmacologic Agent - OD - Right Eye       Time Out 08/10/2022. 8:51 AM. Confirmed correct patient, procedure, site, and patient consented.   Anesthesia Topical anesthesia was used. Anesthetic medications included Lidocaine 2%, Proparacaine 0.5%.   Procedure Preparation included 5% betadine to ocular surface, eyelid speculum. A supplied needle was used.   Injection: 1.25 mg Bevacizumab 1.25mg /0.26ml   Route: Intravitreal, Site: Right Eye   NDC: P3213405, Lot: [id number], Expiration date: 09/05/2022   Post-op Post injection exam found visual acuity of at least counting fingers. The patient tolerated the procedure well. There were no complications. The patient received written and verbal post procedure care education.      Intravitreal Injection, Pharmacologic Agent - OS - Left Eye        Time Out 08/10/2022. 8:52 AM. Confirmed correct patient, procedure, site, and patient consented.   Anesthesia Topical anesthesia was used. Anesthetic medications included Lidocaine 2%, Proparacaine 0.5%.   Procedure Preparation included 5% betadine to ocular surface, eyelid speculum. A (32g) needle was used.   Injection: 1.25 mg Bevacizumab 1.25mg /0.50ml   Route: Intravitreal, Site: Left Eye   NDC: P3213405, Lot: [id number], Expiration date: 11/01/2022   Post-op Post injection exam found visual acuity of at least counting fingers. The patient tolerated the procedure well. There were no complications. The patient received written and verbal post procedure care education. Post injection medications were not given.            ASSESSMENT/PLAN:    ICD-10-CM   1. Exudative age-related macular degeneration of left eye with active choroidal neovascularization (HCC)  H35.3221 OCT, Retina - OU - Both Eyes    Intravitreal Injection, Pharmacologic Agent - OS - Left Eye    Bevacizumab (AVASTIN) SOLN 1.25 mg    2. Exudative age-related macular degeneration of right eye with active choroidal neovascularization (HCC)  H35.3211 Intravitreal Injection, Pharmacologic Agent - OD - Right Eye    Bevacizumab (AVASTIN) SOLN 1.25 mg    3. Diabetes mellitus type 2 without retinopathy (HCC)  E11.9     4. Long term (current) use of  oral hypoglycemic drugs  Z79.84     5. Essential hypertension  I10     6. Hypertensive retinopathy of both eyes  H35.033     7. Pseudophakia of both eyes  Z96.1      1. Exudative age-related macular degeneration, left eye - conversion of OS from nonexudative to exudative ARMD prior to 02.05.20 visit - s/p IVA OS #1 (02.05.20), #2 (03.04.20), #3 (04.30.20), #4 (06.02.20), #5 (07.14.20), #6 (9.1.20), #7 (10.27.20), #8 (12.23.20), #9 (02.03.21), #10 (03.17.21), #11 (04.28.21), #12 (06.09.21), #13 (08.04.21), #14 (09.29.21), #15 (11.22.2021), #16 (01.26.22), #17 (04.06.22),  #18 (06.28.22), #19 (9.13.22), #20 (11.29.22), #21 (01.31.23), #22 (04.04.23), #23 (05.30.23), #24 (07.27.23), #25 (09.19.23), #26 (11.07.23), #27 (12.29.23), #28 (02.14.24), #29 (06.04.24) **history of recurrent / worsening SRF at 9 wk interval, noted on 04.04.23 visit, at 8 week interval on 09.19.23 and 06.04.24** - OCT shows OS: Interval improvement in SRF overlying stable low PED; patchy central ORA, partial PVD at 6 wks  - BCVA OS 20/150 (decreased from 20/60 ?dry eyes)  - recommend IVA OS #30 today, 07.16.24 w/ f/u at 6 wks again  - pt wishes to be treated with IVA OS - RBA of procedure discussed, questions answered  - see procedure notes  - Avastin informed consent obtained, re-signed and scanned 06.06.24 (OU)  - f/u in 6 wks for DFE, OCT, likely injection OS   2. Age related macular degeneration, non-exudative, right eye  - s/p IVA OD #1 (06.04.24)  - conversion to exu ARMD on 06.04.24 exam  - OCT OD shows : Interval improvement in central PED and SRHM, stable improvement in [facility] at 6 wks  - recommend IVA OD #2 today, 07.16.24  - pt wishes to be treated with IVA  - RBA of procedure discussed, questions answered - IVA informed consent obtained and signed, 06.04.24 (OU) - see procedure note  - f/u in 6 wks, DFE, OCT  3,4. Diabetes mellitus, type 2 without retinopathy - The incidence, risk factors for progression, natural history and treatment options for diabetic retinopathy  were discussed with patient.   - The need for close monitoring of blood glucose, blood pressure, and serum lipids, avoiding cigarette or any type of tobacco, and the need for long term follow up was also discussed with patient.  - f/u in 1 year  5,6. Hypertensive retinopathy OU  - discussed importance of tight BP control  - continue to monitor  7. Pseudophakia OU  - s/p CE/IOL OU  - beautiful surgeries, doing well  - continue to monitor  8. Dry eyes OU  - pt on restasis BID OU - recommend artificial  tears and lubricating ointment as needed   Ophthalmic Meds Ordered this visit:  Meds ordered this encounter  Medications   Bevacizumab (AVASTIN) SOLN 1.25 mg   Bevacizumab (AVASTIN) SOLN 1.25 mg    Return in about 6 weeks (around 09/21/2022) for f/u exu ARMD OU, DFE, OCT.  There are no Patient Instructions on file for this visit.    This document serves as a record of services personally performed by Karie Chimera, MD, PhD. It was created on their behalf by Gerilyn Nestle, COT an ophthalmic technician. The creation of this record is the provider's dictation and/or activities during the visit.    Electronically signed by:  Charlette Caffey, COT  08/10/22 12:01 PM  This document serves as a record of services personally performed by Karie Chimera, MD, PhD. It was created on their behalf by Marchelle Folks  Thomes Lolling, OA an ophthalmic technician. The creation of this record is the provider's dictation and/or activities during the visit.    Electronically signed by: Glee Arvin. Manson Passey, OA 08/10/22 12:01 PM  Karie Chimera, M.D., Ph.D. Diseases & Surgery of the Retina and Vitreous [facility]  I have reviewed the above documentation for accuracy and completeness, and I agree with the above. Karie Chimera, M.D., Ph.D. 08/10/22 12:02 PM   Abbreviations: M myopia (nearsighted); A astigmatism; H hyperopia (farsighted); P presbyopia; Mrx spectacle prescription;  CTL contact lenses; OD right eye; OS left eye; OU both eyes  XT exotropia; ET esotropia; PEK punctate epithelial keratitis; PEE punctate epithelial erosions; DES dry eye syndrome; MGD meibomian gland dysfunction; ATs artificial tears; PFAT's preservative free artificial tears; NSC nuclear sclerotic cataract; PSC posterior subcapsular cataract; ERM epi-retinal membrane; PVD posterior vitreous detachment; RD retinal detachment; DM diabetes mellitus; DR diabetic retinopathy; NPDR non-proliferative diabetic retinopathy;  PDR proliferative diabetic retinopathy; CSME clinically significant macular edema; DME diabetic macular edema; dbh dot blot hemorrhages; CWS cotton wool spot; POAG primary open angle glaucoma; C/D cup-to-disc ratio; HVF humphrey visual field; GVF goldmann visual field; OCT optical coherence tomography; IOP intraocular pressure; BRVO Branch retinal vein occlusion; CRVO central retinal vein occlusion; CRAO central retinal artery occlusion; BRAO branch retinal artery occlusion; RT retinal tear; SB scleral buckle; PPV pars plana vitrectomy; VH Vitreous hemorrhage; PRP panretinal laser photocoagulation; IVK intravitreal kenalog; VMT vitreomacular traction; MH Macular hole;  NVD neovascularization of the disc; NVE neovascularization elsewhere; AREDS age related eye disease study; ARMD age related macular degeneration; POAG primary open angle glaucoma; EBMD epithelial/anterior basement membrane dystrophy; ACIOL anterior chamber intraocular lens; IOL intraocular lens; PCIOL posterior chamber intraocular lens; Phaco/IOL phacoemulsification with intraocular lens placement; PRK photorefractive keratectomy; LASIK laser assisted in situ keratomileusis; HTN hypertension; DM diabetes mellitus; COPD chronic obstructive pulmonary disease

## 2022-08-10 ENCOUNTER — Ambulatory Visit (INDEPENDENT_AMBULATORY_CARE_PROVIDER_SITE_OTHER): Payer: Medicare Other | Admitting: Ophthalmology

## 2022-08-10 ENCOUNTER — Encounter (INDEPENDENT_AMBULATORY_CARE_PROVIDER_SITE_OTHER): Payer: Self-pay | Admitting: Ophthalmology

## 2022-08-10 ENCOUNTER — Ambulatory Visit: Payer: Medicare Other | Admitting: Podiatry

## 2022-08-10 ENCOUNTER — Encounter: Payer: Self-pay | Admitting: Podiatry

## 2022-08-10 DIAGNOSIS — H35033 Hypertensive retinopathy, bilateral: Secondary | ICD-10-CM

## 2022-08-10 DIAGNOSIS — H353231 Exudative age-related macular degeneration, bilateral, with active choroidal neovascularization: Secondary | ICD-10-CM

## 2022-08-10 DIAGNOSIS — I10 Essential (primary) hypertension: Secondary | ICD-10-CM | POA: Diagnosis not present

## 2022-08-10 DIAGNOSIS — Z7984 Long term (current) use of oral hypoglycemic drugs: Secondary | ICD-10-CM

## 2022-08-10 DIAGNOSIS — E1142 Type 2 diabetes mellitus with diabetic polyneuropathy: Secondary | ICD-10-CM

## 2022-08-10 DIAGNOSIS — Z961 Presence of intraocular lens: Secondary | ICD-10-CM

## 2022-08-10 DIAGNOSIS — H353211 Exudative age-related macular degeneration, right eye, with active choroidal neovascularization: Secondary | ICD-10-CM

## 2022-08-10 DIAGNOSIS — H04123 Dry eye syndrome of bilateral lacrimal glands: Secondary | ICD-10-CM | POA: Diagnosis not present

## 2022-08-10 DIAGNOSIS — B351 Tinea unguium: Secondary | ICD-10-CM | POA: Diagnosis not present

## 2022-08-10 DIAGNOSIS — E119 Type 2 diabetes mellitus without complications: Secondary | ICD-10-CM

## 2022-08-10 DIAGNOSIS — M79674 Pain in right toe(s): Secondary | ICD-10-CM

## 2022-08-10 DIAGNOSIS — M79675 Pain in left toe(s): Secondary | ICD-10-CM | POA: Diagnosis not present

## 2022-08-10 DIAGNOSIS — H353221 Exudative age-related macular degeneration, left eye, with active choroidal neovascularization: Secondary | ICD-10-CM

## 2022-08-10 MED ORDER — BEVACIZUMAB CHEMO INJECTION 1.25MG/0.05ML SYRINGE FOR KALEIDOSCOPE
1.2500 mg | INTRAVITREAL | Status: AC | PRN
  Administered 2022-08-10: 1.25 mg via INTRAVITREAL

## 2022-08-10 NOTE — Progress Notes (Signed)
This patient returns to my office for at risk foot care.  This patient requires this care by a professional since this patient will be at risk due to having kidney disease and diabetic neuropathy.  This patient is unable to cut nails herself since the patient cannot reach her nails.These nails are painful walking and wearing shoes.  This patient presents for at risk foot care today.  General Appearance  Alert, conversant and in no acute stress.  Vascular  Dorsalis pedis and posterior tibial  pulses are  weakly palpable  bilaterally.  Capillary return is within normal limits  bilaterally. Temperature is within normal limits  bilaterally.  Neurologic  Senn-Weinstein monofilament wire test within normal limits  bilaterally. Muscle power within normal limits bilaterally.  Nails Thick disfigured discolored nails with subungual debris  from hallux to fifth toes bilaterally. No evidence of bacterial infection or drainage bilaterally.  Orthopedic  No limitations of motion  feet .  No crepitus or effusions noted.  No bony pathology or digital deformities noted.  Skin  normotropic skin with no porokeratosis noted bilaterally.  No signs of infections or ulcers noted.     Onychomycosis  Pain in right toes  Pain in left toes  Consent was obtained for treatment procedures.   Mechanical debridement of nails 1-5  bilaterally performed with a nail nipper.  Filed with dremel without incident.    Return office visit     3 months                 Told patient to return for periodic foot care and evaluation due to potential at risk complications.   Helane Gunther DPM

## 2022-09-07 NOTE — Progress Notes (Signed)
[facility] - Clinic Note  09/21/2022   CHIEF COMPLAINT Patient presents for Retina Follow Up  HISTORY OF PRESENT ILLNESS: Sue [name] is a 89 y.o. female who presents to the clinic today for:  HPI     Retina Follow Up   Patient presents with  Wet AMD.  In left eye.  Severity is moderate.  Duration of 6 weeks.  Since onset it is stable.  I, the attending physician,  performed the HPI with the patient and updated documentation appropriately.        Comments   Pt here for 6 wk ret f/u exu ARMD OS. Pt states VA is the same. No changes noted.       Last edited by Rennis Chris, MD on 09/21/2022 12:37 PM.     Referring physician: [name], MD [address] [place],  [place] [address]  HISTORICAL INFORMATION:   Selected notes from the MEDICAL RECORD NUMBER Referred by Dr. Baker Pierini for concern of ARMD OU   CURRENT MEDICATIONS: Current Outpatient Medications (Ophthalmic Drugs)  Medication Sig   Polyethyl Glycol-Propyl Glycol (SYSTANE) 0.4-0.3 % SOLN Place 1 drop into both eyes 3 (three) times daily as needed (dry eyes).   RESTASIS 0.05 % ophthalmic emulsion Place 1 drop into both eyes 2 (two) times daily.   No current facility-administered medications for this visit. (Ophthalmic Drugs)   Current Outpatient Medications (Other)  Medication Sig   amLODipine (NORVASC) 10 MG tablet Take 1 tablet by mouth daily.   B-D ULTRAFINE III SHORT PEN 31G X 8 MM MISC USE AS DIRECTED ONCE DAILY SUBCUTANEOUSLY   Calcium Citrate-Vitamin D (CALCIUM + D PO)    calcium-vitamin D (OSCAL WITH D) 500-200 MG-UNIT tablet Take 1 tablet by mouth 2 (two) times daily.    Continuous Blood Gluc Sensor (FREESTYLE LIBRE 2 SENSOR) MISC See admin instructions.   Cyanocobalamin (B-12 PO)    diphenhydramine-acetaminophen (TYLENOL PM) 25-500 MG TABS tablet Take 1 tablet by mouth at bedtime as needed (sleep).   Glucagon (GVOKE HYPOPEN 2-PACK) 0.5 MG/0.1ML SOAJ as  needed for hypoglycemia   glucose blood test strip OneTouch Ultra Test strips  TEST ONCE A DAY ONCE A DAY FINGERSTICK 90   hydrALAZINE (APRESOLINE) 25 MG tablet Take 3 tablets (75 mg total) by mouth every 8 (eight) hours.   hydrochlorothiazide (HYDRODIURIL) 25 MG tablet Take 25 mg by mouth daily.   ibuprofen (ADVIL) 200 MG tablet Take 400 mg by mouth every 6 (six) hours as needed for moderate pain.   Insulin Degludec (TRESIBA FLEXTOUCH) 200 UNIT/ML SOPN Inject 36 Units into the skin at bedtime. (Patient taking differently: Inject 22 Units into the skin at bedtime.)   Insulin Pen Needle (PEN NEEDLES) 32G X 4 MM MISC BD Ultra-Fine Nano Pen Needle 32 gauge x 5/32"  USE AS DIRECTED DAILY   ketoconazole (NIZORAL) 2 % cream Apply topically.   Lancets (ONETOUCH ULTRASOFT) lancets OneTouch UltraSoft Lancets  USE TWICE DAILY   Melatonin 10 MG CAPS Take 10 mg by mouth at bedtime as needed (sleep).   meloxicam (MOBIC) 7.5 MG tablet Take 1 tablet (7.5 mg total) by mouth daily as needed for pain.   Multiple Vitamins-Minerals (ADULT ONE DAILY GUMMIES) CHEW Chew 1 tablet by mouth 2 (two) times daily. Centrum   Multiple Vitamins-Minerals (AIRBORNE) TBEF Take 1 tablet by mouth daily as needed (immune support).   Multiple Vitamins-Minerals (CENTRUM SILVER PO)    Multiple Vitamins-Minerals (PRESERVISION AREDS 2) CAPS  Take 1 capsule by mouth 2 (two) times daily.   mupirocin ointment (BACTROBAN) 2 % Apply to right foot ulcer once daily. (Patient taking differently: Apply 1 application  topically daily as needed (wound care). Apply to right foot ulcer once daily.)   nortriptyline (PAMELOR) 25 MG capsule Take 1 capsule (25 mg total) by mouth at bedtime. (Patient taking differently: Take 25 mg by mouth 2 (two) times daily.)   Omega-3 Fatty Acids (FISH OIL) 1200 MG CAPS Take 1,200 mg by mouth daily with lunch.    omeprazole (PRILOSEC) 20 MG capsule Take 1 capsule by mouth daily.   ondansetron (ZOFRAN) 4 MG tablet Take  4 mg by mouth 3 (three) times daily as needed.   ONE TOUCH ULTRA TEST test strip    PRESCRIPTION MEDICATION by Intravitreal route every 30 (thirty) days. Ophthalmic injection at MD office in left eye   silver sulfADIAZINE (SILVADENE) 1 % cream Apply pea-sized amount to wound daily. (Patient taking differently: Apply 1 application  topically daily. Apply pea-sized amount to wound daily.)   simvastatin (ZOCOR) 20 MG tablet Take 1 tablet by mouth daily.   traMADol (ULTRAM) 50 MG tablet Take 1 tablet (50 mg total) by mouth 2 (two) times daily as needed.   vitamin B-12 (CYANOCOBALAMIN) 1000 MCG tablet Take 1,000 mcg by mouth 2 (two) times daily.    Current Facility-Administered Medications (Other)  Medication Route   Bevacizumab (AVASTIN) SOLN 1.25 mg Intravitreal   Bevacizumab (AVASTIN) SOLN 1.25 mg Intravitreal   REVIEW OF SYSTEMS: ROS   Positive for: Neurological, Skin, Genitourinary, Musculoskeletal, Endocrine, Eyes Negative for: Constitutional, Gastrointestinal, HENT, Cardiovascular, Respiratory, Psychiatric, Allergic/Imm, Heme/Lymph Last edited by Thompson Grayer, COT on 09/21/2022  9:00 AM.     ALLERGIES No Known Allergies  PAST MEDICAL HISTORY Past Medical History:  Diagnosis Date   Anemia    Arthritis    Basal cell carcinoma 11/23/2017   left elbow, right neck TX CX3 5FU    Basal cell carcinoma 04/04/2019   infil-right sideburn (CX35FU)   Chronic kidney disease    stage 3 ckd stage 3 b per dr Nicholos Johns 09-26-2019 note on chart   Complication of anesthesia    deifficulty waking up   COVID-19    Diabetic neuropathy (HCC)    Hyperlipidemia    Hypertension    Hypertensive retinopathy    OU   Infiltrative basal cell carcinoma (BCC) 04/22/2020   Right Zygomatic Area    Macular degeneration    OU   Nodular basal cell carcinoma (BCC) 04/22/2020   Right Anterior Neck   Pneumonia    Right carotid bruit per dr Nicholos Johns 09-26-2019 note on chart   SCC (squamous cell  carcinoma) 11/23/2017   right forehead TX CX3 5FU   SCCA (squamous cell carcinoma) of skin 04/22/2020   Mid Parietal Scalp (in situ)   SCCA (squamous cell carcinoma) of skin 04/22/2020   Right Dorsal Hand (in situ)   Type 2 DM with CKD stage 3 and hypertension (HCC)    Past Surgical History:  Procedure Laterality Date   ABDOMINAL HYSTERECTOMY     AMPUTATION TOE Right 09/28/2019   Procedure: Right 2nd toe amputation - partial;  Surgeon: Park Liter, DPM;  Location: [facility] ;  Service: Podiatry;  Laterality: Right;   CATARACT EXTRACTION     CHOLECYSTECTOMY     EYE SURGERY     HERNIA REPAIR     SPINE SURGERY     TONSILLECTOMY  FAMILY HISTORY History reviewed. No pertinent family history.  SOCIAL HISTORY Social History   Tobacco Use   Smoking status: Former   Smokeless tobacco: Never  Advertising account planner   Vaping status: Never Used  Substance Use Topics   Alcohol use: No    Alcohol/week: 0.0 standard drinks of alcohol   Drug use: No       OPHTHALMIC EXAM:  Base Eye Exam     Visual Acuity (Snellen - Linear)       Right Left   Dist cc 20/40 -1 20/80 -2   Dist ph cc 20/30 -1 20/70 -2    Correction: Glasses         Tonometry (Tonopen, 9:11 AM)       Right Left   Pressure 16 17         Pupils       Pupils Dark Light Shape React APD   Right PERRL 3 2 Round Brisk None   Left PERRL 3 2 Round Brisk None         Visual Fields (Counting fingers)       Left Right    Full Full         Extraocular Movement       Right Left    Full, Ortho Full, Ortho         Neuro/Psych     Oriented x3: Yes   Mood/Affect: Normal         Dilation     Both eyes: 1.0% Mydriacyl, 2.5% Phenylephrine @ 9:11 AM           Slit Lamp and Fundus Exam     Slit Lamp Exam       Right Left   Lids/Lashes Dermatochalasis - upper lid, Telangiectasia, mild MGD Dermatochalasis - upper lid, Telangiectasia, Meibomian gland dysfunction    Conjunctiva/Sclera White and quiet White and quiet   Cornea Temporal Well healed cataract wounds, mild Arcus, trace endo pigment nasally, 1+PEE Arcus, Temporal Well healed cataract wounds, 1-2+ PEE, tear film debris   Anterior Chamber Deep and quiet Deep and quiet   Iris Round and moderately dilated to 4.42mm Round and moderately dilated to 5mm, Iris atrophy from 0100 to 0230, peripheral iridotomy at 0200   Lens 3-piece PC IOL in good position PC IOL in good position   Anterior Vitreous Vitreous syneresis, Posterior vitreous detachment, Weiss ring, vitreous condensations Vitreous syneresis, PVD, vitreous condensations         Fundus Exam       Right Left   Disc 360 Peripapillary atrophy, diffuse mild pallor, compact, sharp rim mild pallor, Peripapillary atrophy and pigmentation   C/D Ratio 0.1 0.2   Macula Blunted foveal reflex, +PED / CNV -- improved, RPE mottling, clumping and early atrophy, Drusen, No heme Blunted foveal reflex, low lying PED / +CNVM with stable improvement in IRF, mild interval increase in shallow SRF, RPE mottling and clumping, Drusen, No heme, early atrophy   Vessels Attenuated, Tortuous Attenuated, mild tortuosity   Periphery Attached, 360 Reticular degeneration, No heme Attached, 360 Reticular degeneration, No heme           Refraction     Wearing Rx       Sphere Cylinder Axis Add   Right -2.00 +1.25 161 +2.50   Left +0.25 +0.50 004 +2.50           IMAGING AND PROCEDURES  Imaging and Procedures for @TODAY @  OCT, Retina - OU - Both Eyes  Right Eye Quality was good. Central Foveal Thickness: 206. Progression has improved. Findings include no IRF, no SRF, abnormal foveal contour, retinal drusen , subretinal hyper-reflective material, intraretinal hyper-reflective material, pigment epithelial detachment, outer retinal atrophy (Mild interval improvement in focal SRHM, persistent central PED, stable improvement in [facility]).   Left Eye Quality was  good. Central Foveal Thickness: 278. Progression has worsened. Findings include normal foveal contour, no IRF, retinal drusen , epiretinal membrane, pigment epithelial detachment, subretinal fluid, outer retinal atrophy (Mild Interval increase in shallow SRF overlying stable low PED; patchy central ORA, partial PVD).   Notes *Images captured and stored on drive  Diagnosis / Impression:  OD: Mild interval improvement in focal SRHM, persistent central PED, stable improvement in IRHM OS: Exudative ARMD -- Mild Interval increase in shallow SRF overlying stable low PED; patchy central ORA, partial PVD  Clinical management:  See below  Abbreviations: NFP - Normal foveal profile. CME - cystoid macular edema. PED - pigment epithelial detachment. IRF - intraretinal fluid. SRF - subretinal fluid. EZ - ellipsoid zone. ERM - epiretinal membrane. ORA - outer retinal atrophy. ORT - outer retinal tubulation. SRHM - subretinal hyper-reflective material       Intravitreal Injection, Pharmacologic Agent - OS - Left Eye       Time Out 09/21/2022. 10:15 AM. Confirmed correct patient, procedure, site, and patient consented.   Anesthesia Topical anesthesia was used. Anesthetic medications included Lidocaine 2%, Proparacaine 0.5%.   Procedure Preparation included 5% betadine to ocular surface, eyelid speculum. A (32g) needle was used.   Injection: 1.25 mg Bevacizumab 1.25mg /0.33ml   Route: Intravitreal, Site: Left Eye   NDC: P3213405, Lot: [id number] , Expiration date: 10/02/2022   Post-op Post injection exam found visual acuity of at least counting fingers. The patient tolerated the procedure well. There were no complications. The patient received written and verbal post procedure care education. Post injection medications were not given.      Intravitreal Injection, Pharmacologic Agent - OD - Right Eye       Time Out 09/21/2022. 10:15 AM. Confirmed correct patient, procedure, site, and patient  consented.   Anesthesia Topical anesthesia was used. Anesthetic medications included Lidocaine 2%, Proparacaine 0.5%.   Procedure Preparation included 5% betadine to ocular surface, eyelid speculum. A supplied needle was used.   Injection: 1.25 mg Bevacizumab 1.25mg /0.61ml   Route: Intravitreal, Site: Right Eye   NDC: P3213405, Lot: [id number], Expiration date: 10/25/2022   Post-op Post injection exam found visual acuity of at least counting fingers. The patient tolerated the procedure well. There were no complications. The patient received written and verbal post procedure care education.            ASSESSMENT/PLAN:    ICD-10-CM   1. Exudative age-related macular degeneration of left eye with active choroidal neovascularization (HCC)  H35.3221 OCT, Retina - OU - Both Eyes    Intravitreal Injection, Pharmacologic Agent - OS - Left Eye    Bevacizumab (AVASTIN) SOLN 1.25 mg    2. Exudative age-related macular degeneration of right eye with active choroidal neovascularization (HCC)  H35.3211 OCT, Retina - OU - Both Eyes    Intravitreal Injection, Pharmacologic Agent - OD - Right Eye    Bevacizumab (AVASTIN) SOLN 1.25 mg    3. Diabetes mellitus type 2 without retinopathy (HCC)  E11.9     4. Long term (current) use of oral hypoglycemic drugs  Z79.84     5. Essential hypertension  I10     6. Hypertensive retinopathy of  both eyes  H35.033     7. Pseudophakia of both eyes  Z96.1     8. Dry eyes  H04.123      1. Exudative age-related macular degeneration, left eye - conversion of OS from nonexudative to exudative ARMD prior to 02.05.20 visit - s/p IVA OS #1 (02.05.20), #2 (03.04.20), #3 (04.30.20), #4 (06.02.20), #5 (07.14.20), #6 (9.1.20), #7 (10.27.20), #8 (12.23.20), #9 (02.03.21), #10 (03.17.21), #11 (04.28.21), #12 (06.09.21), #13 (08.04.21), #14 (09.29.21), #15 (11.22.2021), #16 (01.26.22), #17 (04.06.22), #18 (06.28.22), #19 (9.13.22), #20 (11.29.22), #21 (01.31.23), #22  (04.04.23), #23 (05.30.23), #24 (07.27.23), #25 (09.19.23), #26 (11.07.23), #27 (12.29.23), #28 (02.14.24), #29 (06.04.24), #30 (07.16.24) **history of recurrent / worsening SRF at 9 wk interval, noted on 04.04.23 visit, at 8 week interval on 09.19.23 and 06.04.24** - OCT shows OS: Mild Interval increase in shallow SRF overlying stable low PED; patchy central ORA, partial PVD at 6 weeks **discussed decreased efficacy / resistance to Avastin and potential benefit of switching medication**  - BCVA OS 20/70 - stable  - recommend IVA OS #31 today, 08.27.24 w/ f/u back to 4-5 wks  - pt wishes to be treated with IVA OS - RBA of procedure discussed, questions answered  - see procedure notes  - Avastin informed consent obtained, re-signed and scanned 06.06.24 (OU)  - will check [employer] auth for next visit  - f/u in 4-5 wks for DFE, OCT, likely injection OS   2. Age related macular degeneration, non-exudative, right eye  - s/p IVA OD #1 (06.04.24), #2 (07.16.24)  - conversion to exu ARMD on 06.04.24 exam - OCT OD shows :Mild interval improvement in focal [facility], persistent central PED, stable improvement in [facility] at 6 weeks  - recommend IVA OD #3 today, 08.27.24  - pt wishes to be treated with IVA  - RBA of procedure discussed, questions answered - IVA informed consent obtained and signed, 06.04.24 (OU) - see procedure note  - f/u in 4-5 wks, DFE, OCT  3,4. Diabetes mellitus, type 2 without retinopathy - The incidence, risk factors for progression, natural history and treatment options for diabetic retinopathy  were discussed with patient.   - The need for close monitoring of blood glucose, blood pressure, and serum lipids, avoiding cigarette or any type of tobacco, and the need for long term follow up was also discussed with patient.  - f/u in 1 year  5,6. Hypertensive retinopathy OU  - discussed importance of tight BP control  - continue to monitor  7. Pseudophakia OU  - s/p CE/IOL OU  -  beautiful surgeries, doing well  - continue to monitor  8. Dry eyes OU  - pt on restasis BID OU - recommend artificial tears and lubricating ointment as needed   Ophthalmic Meds Ordered this visit:  Meds ordered this encounter  Medications   Bevacizumab (AVASTIN) SOLN 1.25 mg   Bevacizumab (AVASTIN) SOLN 1.25 mg    Return for 4-5 wks - ex ARMD OU, Dilated Exam, OCT, Possible Injxn.  There are no Patient Instructions on file for this visit.   This document serves as a record of services personally performed by Karie Chimera, MD, PhD. It was created on their behalf by Laurey Morale, COT an ophthalmic technician. The creation of this record is the provider's dictation and/or activities during the visit.    Electronically signed by:  Charlette Caffey, COT  09/21/22 12:40 PM  This document serves as a record of services personally performed by Karie Chimera,  MD, PhD. It was created on their behalf by Glee Arvin. Manson Passey, OA an ophthalmic technician. The creation of this record is the provider's dictation and/or activities during the visit.    Electronically signed by: Glee Arvin. Manson Passey, OA 09/21/22 12:40 PM  Karie Chimera, M.D., Ph.D. Diseases & Surgery of the Retina and Vitreous [facility]  I have reviewed the above documentation for accuracy and completeness, and I agree with the above. Karie Chimera, M.D., Ph.D. 09/21/22 12:40 PM  Abbreviations: M myopia (nearsighted); A astigmatism; H hyperopia (farsighted); P presbyopia; Mrx spectacle prescription;  CTL contact lenses; OD right eye; OS left eye; OU both eyes  XT exotropia; ET esotropia; PEK punctate epithelial keratitis; PEE punctate epithelial erosions; DES dry eye syndrome; MGD meibomian gland dysfunction; ATs artificial tears; PFAT's preservative free artificial tears; NSC nuclear sclerotic cataract; PSC posterior subcapsular cataract; ERM epi-retinal membrane; PVD posterior vitreous detachment; RD  retinal detachment; DM diabetes mellitus; DR diabetic retinopathy; NPDR non-proliferative diabetic retinopathy; PDR proliferative diabetic retinopathy; CSME clinically significant macular edema; DME diabetic macular edema; dbh dot blot hemorrhages; CWS cotton wool spot; POAG primary open angle glaucoma; C/D cup-to-disc ratio; HVF humphrey visual field; GVF goldmann visual field; OCT optical coherence tomography; IOP intraocular pressure; BRVO Branch retinal vein occlusion; CRVO central retinal vein occlusion; CRAO central retinal artery occlusion; BRAO branch retinal artery occlusion; RT retinal tear; SB scleral buckle; PPV pars plana vitrectomy; VH Vitreous hemorrhage; PRP panretinal laser photocoagulation; IVK intravitreal kenalog; VMT vitreomacular traction; MH Macular hole;  NVD neovascularization of the disc; NVE neovascularization elsewhere; AREDS age related eye disease study; ARMD age related macular degeneration; POAG primary open angle glaucoma; EBMD epithelial/anterior basement membrane dystrophy; ACIOL anterior chamber intraocular lens; IOL intraocular lens; PCIOL posterior chamber intraocular lens; Phaco/IOL phacoemulsification with intraocular lens placement; PRK photorefractive keratectomy; LASIK laser assisted in situ keratomileusis; HTN hypertension; DM diabetes mellitus; COPD chronic obstructive pulmonary disease

## 2022-09-09 DIAGNOSIS — C44612 Basal cell carcinoma of skin of right upper limb, including shoulder: Secondary | ICD-10-CM | POA: Diagnosis not present

## 2022-09-09 DIAGNOSIS — C4441 Basal cell carcinoma of skin of scalp and neck: Secondary | ICD-10-CM | POA: Diagnosis not present

## 2022-09-09 DIAGNOSIS — L570 Actinic keratosis: Secondary | ICD-10-CM | POA: Diagnosis not present

## 2022-09-09 DIAGNOSIS — X32XXXA Exposure to sunlight, initial encounter: Secondary | ICD-10-CM | POA: Diagnosis not present

## 2022-09-09 DIAGNOSIS — D044 Carcinoma in situ of skin of scalp and neck: Secondary | ICD-10-CM | POA: Diagnosis not present

## 2022-09-21 ENCOUNTER — Encounter (INDEPENDENT_AMBULATORY_CARE_PROVIDER_SITE_OTHER): Payer: Self-pay | Admitting: Ophthalmology

## 2022-09-21 ENCOUNTER — Ambulatory Visit (INDEPENDENT_AMBULATORY_CARE_PROVIDER_SITE_OTHER): Payer: Medicare Other | Admitting: Ophthalmology

## 2022-09-21 DIAGNOSIS — H35033 Hypertensive retinopathy, bilateral: Secondary | ICD-10-CM

## 2022-09-21 DIAGNOSIS — E119 Type 2 diabetes mellitus without complications: Secondary | ICD-10-CM | POA: Diagnosis not present

## 2022-09-21 DIAGNOSIS — H353211 Exudative age-related macular degeneration, right eye, with active choroidal neovascularization: Secondary | ICD-10-CM

## 2022-09-21 DIAGNOSIS — H353231 Exudative age-related macular degeneration, bilateral, with active choroidal neovascularization: Secondary | ICD-10-CM

## 2022-09-21 DIAGNOSIS — Z961 Presence of intraocular lens: Secondary | ICD-10-CM

## 2022-09-21 DIAGNOSIS — Z7984 Long term (current) use of oral hypoglycemic drugs: Secondary | ICD-10-CM

## 2022-09-21 DIAGNOSIS — I10 Essential (primary) hypertension: Secondary | ICD-10-CM | POA: Diagnosis not present

## 2022-09-21 DIAGNOSIS — H04123 Dry eye syndrome of bilateral lacrimal glands: Secondary | ICD-10-CM | POA: Diagnosis not present

## 2022-09-21 DIAGNOSIS — H353221 Exudative age-related macular degeneration, left eye, with active choroidal neovascularization: Secondary | ICD-10-CM

## 2022-09-21 MED ORDER — BEVACIZUMAB CHEMO INJECTION 1.25MG/0.05ML SYRINGE FOR KALEIDOSCOPE
1.2500 mg | INTRAVITREAL | Status: AC | PRN
Start: 2022-09-21 — End: 2022-09-21
  Administered 2022-09-21: 1.25 mg via INTRAVITREAL

## 2022-10-14 NOTE — Progress Notes (Signed)
[facility] - Clinic Note  10/26/2022   CHIEF COMPLAINT Patient presents for Retina Follow Up  HISTORY OF PRESENT ILLNESS: Sue [name] is a 89 y.o. female who presents to the clinic today for:  HPI     Retina Follow Up   Patient presents with  Wet AMD.  In left eye.  This started months ago.  Severity is moderate.  Duration of 4 weeks.  Since onset it is stable.  I, the attending physician,  performed the HPI with the patient and updated documentation appropriately.        Comments   Patient states that she is seeing a tiny floater in the right eye. She is using Restasis OU BID and AT's OU PRN. Her blood sugar was 118.      Last edited by Rennis Chris, MD on 10/26/2022 11:46 AM.     Referring physician: [name], MD [address] [place],  [place] [address]  HISTORICAL INFORMATION:   Selected notes from the MEDICAL RECORD NUMBER Referred by Dr. Baker Pierini for concern of ARMD OU   CURRENT MEDICATIONS: Current Outpatient Medications (Ophthalmic Drugs)  Medication Sig   Polyethyl Glycol-Propyl Glycol (SYSTANE) 0.4-0.3 % SOLN Place 1 drop into both eyes 3 (three) times daily as needed (dry eyes).   RESTASIS 0.05 % ophthalmic emulsion Place 1 drop into both eyes 2 (two) times daily.   No current facility-administered medications for this visit. (Ophthalmic Drugs)   Current Outpatient Medications (Other)  Medication Sig   amLODipine (NORVASC) 10 MG tablet Take 1 tablet by mouth daily.   B-D ULTRAFINE III SHORT PEN 31G X 8 MM MISC USE AS DIRECTED ONCE DAILY SUBCUTANEOUSLY   Calcium Citrate-Vitamin D (CALCIUM + D PO)    calcium-vitamin D (OSCAL WITH D) 500-200 MG-UNIT tablet Take 1 tablet by mouth 2 (two) times daily.    Continuous Blood Gluc Sensor (FREESTYLE LIBRE 2 SENSOR) MISC See admin instructions.   Cyanocobalamin (B-12 PO)    diphenhydramine-acetaminophen (TYLENOL PM) 25-500 MG TABS tablet Take 1 tablet by mouth at  bedtime as needed (sleep).   Glucagon (GVOKE HYPOPEN 2-PACK) 0.5 MG/0.1ML SOAJ as needed for hypoglycemia   glucose blood test strip OneTouch Ultra Test strips  TEST ONCE A DAY ONCE A DAY FINGERSTICK 90   hydrALAZINE (APRESOLINE) 25 MG tablet Take 3 tablets (75 mg total) by mouth every 8 (eight) hours.   hydrochlorothiazide (HYDRODIURIL) 25 MG tablet Take 25 mg by mouth daily.   ibuprofen (ADVIL) 200 MG tablet Take 400 mg by mouth every 6 (six) hours as needed for moderate pain.   Insulin Degludec (TRESIBA FLEXTOUCH) 200 UNIT/ML SOPN Inject 36 Units into the skin at bedtime. (Patient taking differently: Inject 22 Units into the skin at bedtime.)   Insulin Pen Needle (PEN NEEDLES) 32G X 4 MM MISC BD Ultra-Fine Nano Pen Needle 32 gauge x 5/32"  USE AS DIRECTED DAILY   ketoconazole (NIZORAL) 2 % cream Apply topically.   Lancets (ONETOUCH ULTRASOFT) lancets OneTouch UltraSoft Lancets  USE TWICE DAILY   Melatonin 10 MG CAPS Take 10 mg by mouth at bedtime as needed (sleep).   meloxicam (MOBIC) 7.5 MG tablet Take 1 tablet (7.5 mg total) by mouth daily as needed for pain.   Multiple Vitamins-Minerals (ADULT ONE DAILY GUMMIES) CHEW Chew 1 tablet by mouth 2 (two) times daily. Centrum   Multiple Vitamins-Minerals (AIRBORNE) TBEF Take 1 tablet by mouth daily as needed (immune support).   Multiple  Vitamins-Minerals (CENTRUM SILVER PO)    Multiple Vitamins-Minerals (PRESERVISION AREDS 2) CAPS Take 1 capsule by mouth 2 (two) times daily.   mupirocin ointment (BACTROBAN) 2 % Apply to right foot ulcer once daily. (Patient taking differently: Apply 1 application  topically daily as needed (wound care). Apply to right foot ulcer once daily.)   nortriptyline (PAMELOR) 25 MG capsule Take 1 capsule (25 mg total) by mouth at bedtime. (Patient taking differently: Take 25 mg by mouth 2 (two) times daily.)   Omega-3 Fatty Acids (FISH OIL) 1200 MG CAPS Take 1,200 mg by mouth daily with lunch.    omeprazole (PRILOSEC) 20  MG capsule Take 1 capsule by mouth daily.   ondansetron (ZOFRAN) 4 MG tablet Take 4 mg by mouth 3 (three) times daily as needed.   ONE TOUCH ULTRA TEST test strip    PRESCRIPTION MEDICATION by Intravitreal route every 30 (thirty) days. Ophthalmic injection at MD office in left eye   silver sulfADIAZINE (SILVADENE) 1 % cream Apply pea-sized amount to wound daily. (Patient taking differently: Apply 1 application  topically daily. Apply pea-sized amount to wound daily.)   simvastatin (ZOCOR) 20 MG tablet Take 1 tablet by mouth daily.   traMADol (ULTRAM) 50 MG tablet Take 1 tablet (50 mg total) by mouth 2 (two) times daily as needed.   vitamin B-12 (CYANOCOBALAMIN) 1000 MCG tablet Take 1,000 mcg by mouth 2 (two) times daily.    Current Facility-Administered Medications (Other)  Medication Route   Bevacizumab (AVASTIN) SOLN 1.25 mg Intravitreal   Bevacizumab (AVASTIN) SOLN 1.25 mg Intravitreal   REVIEW OF SYSTEMS: ROS   Positive for: Neurological, Skin, Genitourinary, Musculoskeletal, Endocrine, Eyes Negative for: Constitutional, Gastrointestinal, HENT, Cardiovascular, Respiratory, Psychiatric, Allergic/Imm, Heme/Lymph Last edited by Charlette Caffey, COT on 10/26/2022  8:53 AM.     ALLERGIES No Known Allergies  PAST MEDICAL HISTORY Past Medical History:  Diagnosis Date   Anemia    Arthritis    Basal cell carcinoma 11/23/2017   left elbow, right neck TX CX3 5FU    Basal cell carcinoma 04/04/2019   infil-right sideburn (CX35FU)   Chronic kidney disease    stage 3 ckd stage 3 b per dr Nicholos Johns 09-26-2019 note on chart   Complication of anesthesia    deifficulty waking up   COVID-19    Diabetic neuropathy (HCC)    Hyperlipidemia    Hypertension    Hypertensive retinopathy    OU   Infiltrative basal cell carcinoma (BCC) 04/22/2020   Right Zygomatic Area    Macular degeneration    OU   Nodular basal cell carcinoma (BCC) 04/22/2020   Right Anterior Neck   Pneumonia    Right  carotid bruit per dr Nicholos Johns 09-26-2019 note on chart   SCC (squamous cell carcinoma) 11/23/2017   right forehead TX CX3 5FU   SCCA (squamous cell carcinoma) of skin 04/22/2020   Mid Parietal Scalp (in situ)   SCCA (squamous cell carcinoma) of skin 04/22/2020   Right Dorsal Hand (in situ)   Type 2 DM with CKD stage 3 and hypertension (HCC)    Past Surgical History:  Procedure Laterality Date   ABDOMINAL HYSTERECTOMY     AMPUTATION TOE Right 09/28/2019   Procedure: Right 2nd toe amputation - partial;  Surgeon: Park Liter, DPM;  Location: [facility];  Service: Podiatry;  Laterality: Right;   CATARACT EXTRACTION     CHOLECYSTECTOMY     EYE SURGERY     HERNIA REPAIR  SPINE SURGERY     TONSILLECTOMY     FAMILY HISTORY History reviewed. No pertinent family history.  SOCIAL HISTORY Social History   Tobacco Use   Smoking status: Former   Smokeless tobacco: Never  Advertising account planner   Vaping status: Never Used  Substance Use Topics   Alcohol use: No    Alcohol/week: 0.0 standard drinks of alcohol   Drug use: No       OPHTHALMIC EXAM:  Base Eye Exam     Visual Acuity (Snellen - Linear)       Right Left   Dist cc 20/60 20/70   Dist ph cc 20/40 +2 NI    Correction: Glasses         Tonometry (Tonopen, 8:59 AM)       Right Left   Pressure 15 14         Pupils       Dark Light Shape React APD   Right 3 2 Round Brisk None   Left 3 2 Round Brisk None         Visual Fields       Left Right    Full Full         Extraocular Movement       Right Left    Full, Ortho Full, Ortho         Neuro/Psych     Oriented x3: Yes   Mood/Affect: Normal         Dilation     Both eyes: 1.0% Mydriacyl @ 8:54 AM           Slit Lamp and Fundus Exam     Slit Lamp Exam       Right Left   Lids/Lashes Dermatochalasis - upper lid, Telangiectasia, mild MGD Dermatochalasis - upper lid, Telangiectasia, Meibomian gland dysfunction    Conjunctiva/Sclera White and quiet White and quiet   Cornea Temporal Well healed cataract wounds, mild Arcus, trace endo pigment nasally, 1+PEE Arcus, Temporal Well healed cataract wounds, 1-2+ PEE, tear film debris   Anterior Chamber Deep and quiet Deep and quiet   Iris Round and moderately dilated to 4.78mm Round and moderately dilated to 5mm, Iris atrophy from 0100 to 0230, peripheral iridotomy at 0200   Lens 3-piece PC IOL in good position PC IOL in good position   Anterior Vitreous Vitreous syneresis, Posterior vitreous detachment, Weiss ring, vitreous condensations Vitreous syneresis, PVD, vitreous condensations         Fundus Exam       Right Left   Disc 360 Peripapillary atrophy, diffuse mild pallor, compact, sharp rim mild pallor, Peripapillary atrophy and pigmentation   C/D Ratio 0.1 0.2   Macula Blunted foveal reflex, +PED / CNV, RPE mottling, clumping and early atrophy, Drusen, No heme Blunted foveal reflex, low lying PED / +CNVM with stable improvement in IRF, mild interval improvement in shallow SRF, RPE mottling and clumping, Drusen, No heme, early atrophy   Vessels Attenuated, Tortuous Attenuated, mild tortuosity   Periphery Attached, 360 Reticular degeneration, No heme Attached, 360 Reticular degeneration, No heme           Refraction     Wearing Rx       Sphere Cylinder Axis Add   Right -2.00 +1.25 161 +2.50   Left +0.25 +0.50 004 +2.50           IMAGING AND PROCEDURES  Imaging and Procedures for @TODAY @  OCT, Retina - OU - Both Eyes  Right Eye Quality was good. Central Foveal Thickness: 212. Progression has been stable. Findings include no IRF, no SRF, abnormal foveal contour, retinal drusen , subretinal hyper-reflective material, intraretinal hyper-reflective material, pigment epithelial detachment, outer retinal atrophy (Persistent focal SRHM and central PED, stable improvement in [facility]).   Left Eye Quality was good. Central Foveal Thickness:  248. Progression has improved. Findings include normal foveal contour, no IRF, retinal drusen , epiretinal membrane, pigment epithelial detachment, subretinal fluid, outer retinal atrophy (Mild Interval improvement in shallow SRF overlying stable low PED; patchy central ORA, partial PVD).   Notes *Images captured and stored on drive  Diagnosis / Impression:  OD: Persistent focal SRHM and central PED, stable improvement in IRHM OS: Exudative ARMD -- Mild Interval improvement in shallow SRF overlying stable low PED; patchy central ORA, partial PVD  Clinical management:  See below  Abbreviations: NFP - Normal foveal profile. CME - cystoid macular edema. PED - pigment epithelial detachment. IRF - intraretinal fluid. SRF - subretinal fluid. EZ - ellipsoid zone. ERM - epiretinal membrane. ORA - outer retinal atrophy. ORT - outer retinal tubulation. SRHM - subretinal hyper-reflective material       Intravitreal Injection, Pharmacologic Agent - OS - Left Eye       Time Out 10/26/2022. 9:35 AM. Confirmed correct patient, procedure, site, and patient consented.   Anesthesia Topical anesthesia was used. Anesthetic medications included Lidocaine 2%, Proparacaine 0.5%.   Procedure Preparation included 5% betadine to ocular surface, eyelid speculum. A (32g) needle was used.   Injection: 2 mg aflibercept 2 MG/0.05ML   Route: Intravitreal, Site: Left Eye   NDC: L6038910, Lot: [id number], Expiration date: 12/25/2023, Waste: 0 mL   Post-op Post injection exam found visual acuity of at least counting fingers. The patient tolerated the procedure well. There were no complications. The patient received written and verbal post procedure care education. Post injection medications were not given.      Intravitreal Injection, Pharmacologic Agent - OD - Right Eye       Time Out 10/26/2022. 9:41 AM. Confirmed correct patient, procedure, site, and patient consented.   Anesthesia Topical  anesthesia was used. Anesthetic medications included Lidocaine 2%, Proparacaine 0.5%.   Procedure Preparation included 5% betadine to ocular surface, eyelid speculum. A (32g) needle was used.   Injection: 2 mg aflibercept 2 MG/0.05ML   Route: Intravitreal, Site: Right Eye   NDC: L6038910, Lot: [id number], Expiration date: 11/25/2023, Waste: 0 mL   Post-op Post injection exam found visual acuity of at least counting fingers. The patient tolerated the procedure well. There were no complications. The patient received written and verbal post procedure care education.            ASSESSMENT/PLAN:    ICD-10-CM   1. Exudative age-related macular degeneration of left eye with active choroidal neovascularization (HCC)  H35.3221 OCT, Retina - OU - Both Eyes    Intravitreal Injection, Pharmacologic Agent - OS - Left Eye    Intravitreal Injection, Pharmacologic Agent - OD - Right Eye    aflibercept (EYLEA) SOLN 2 mg    aflibercept (EYLEA) SOLN 2 mg    2. Exudative age-related macular degeneration of right eye with active choroidal neovascularization (HCC)  H35.3211 CANCELED: Intravitreal Injection, Pharmacologic Agent - OS - Left Eye    3. Diabetes mellitus type 2 without retinopathy (HCC)  E11.9     4. Long term (current) use of oral hypoglycemic drugs  Z79.84     5. Essential hypertension  I10  6. Hypertensive retinopathy of both eyes  H35.033     7. Pseudophakia of both eyes  Z96.1     8. Dry eyes  H04.123      1. Exudative age-related macular degeneration, left eye - conversion of OS from nonexudative to exudative ARMD prior to 02.05.20 visit - s/p IVA OS #1 (02.05.20), #2 (03.04.20), #3 (04.30.20), #4 (06.02.20), #5 (07.14.20), #6 (9.1.20), #7 (10.27.20), #8 (12.23.20), #9 (02.03.21), #10 (03.17.21), #11 (04.28.21), #12 (06.09.21), #13 (08.04.21), #14 (09.29.21), #15 (11.22.2021), #16 (01.26.22), #17 (04.06.22), #18 (06.28.22), #19 (9.13.22), #20 (11.29.22), #21 (01.31.23),  #22 (04.04.23), #23 (05.30.23), #24 (07.27.23), #25 (09.19.23), #26 (11.07.23), #27 (12.29.23), #28 (02.14.24), #29 (06.04.24), #30 (07.16.24), #31 (08.27.24) **history of recurrent / worsening SRF at 9 wk interval, noted on 04.04.23 visit, at 8 week interval on 09.19.23 and 06.04.24** - OCT shows OS: Exudative ARMD -- Mild Interval improvement in shallow SRF overlying stable low PED; patchy central ORA, partial PVD at 5 weeks  - BCVA OS 20/70 - stable **discussed decreased efficacy / resistance to Avastin and potential benefit of switching medication**   - recommend switching to IVE OS #1 today, 10.01.24 w/ f/u at 5 wks again  - pt wishes to proceed with injection - RBA of procedure discussed, questions answered  - see procedure notes  - Eylea informed consent obtained, signed and scanned 10.01.24 (OU)  - Eylea approved for 2024  - f/u in 4-5 wks for DFE, OCT, likely injection OS   2. Age related macular degeneration, non-exudative, right eye  - s/p IVA OD #1 (06.04.24), #2 (07.16.24), #3 (08.27.24)  - conversion to exu ARMD on 06.04.24 exam - OCT OD: Persistent focal SRHM and central PED, stable improvement in IRHM at 5 weeks **discussed decreased efficacy / resistance to Avastin and potential benefit of switching medication**   - recommend switching to IVE OD #1 today, 10.01.24 with follow up at 5 weeks again  - pt wishes to proceed with injection  - RBA of procedure discussed, questions answered - IVE informed consent obtained and signed, 10.01.24 (OU) - see procedure note  - f/u in 5 wks, DFE, OCT  3,4. Diabetes mellitus, type 2 without retinopathy - The incidence, risk factors for progression, natural history and treatment options for diabetic retinopathy  were discussed with patient.   - The need for close monitoring of blood glucose, blood pressure, and serum lipids, avoiding cigarette or any type of tobacco, and the need for long term follow up was also discussed with patient.  -  f/u in 1 year  5,6. Hypertensive retinopathy OU  - discussed importance of tight BP control  - continue to monitor  7. Pseudophakia OU  - s/p CE/IOL OU  - beautiful surgeries, doing well  - continue to monitor  8. Dry eyes OU  - pt on restasis BID OU - recommend artificial tears and lubricating ointment as needed   Ophthalmic Meds Ordered this visit:  Meds ordered this encounter  Medications   aflibercept (EYLEA) SOLN 2 mg   aflibercept (EYLEA) SOLN 2 mg    Return in about 5 weeks (around 11/30/2022) for f/u exu ARMD OU, DFE, OCT.  There are no Patient Instructions on file for this visit.   This document serves as a record of services personally performed by Karie Chimera, MD, PhD. It was created on their behalf by Laurey Morale, COT an ophthalmic technician. The creation of this record is the provider's dictation and/or activities during the visit.  Electronically signed by:  Charlette Caffey, COT  10/26/22 11:48 AM  This document serves as a record of services personally performed by Karie Chimera, MD, PhD. It was created on their behalf by Glee Arvin. Manson Passey, OA an ophthalmic technician. The creation of this record is the provider's dictation and/or activities during the visit.    Electronically signed by: Glee Arvin. Manson Passey, OA 10/26/22 11:48 AM  Karie Chimera, M.D., Ph.D. Diseases & Surgery of the Retina and Vitreous [facility]  I have reviewed the above documentation for accuracy and completeness, and I agree with the above. Karie Chimera, M.D., Ph.D. 10/26/22 11:50 AM  Abbreviations: M myopia (nearsighted); A astigmatism; H hyperopia (farsighted); P presbyopia; Mrx spectacle prescription;  CTL contact lenses; OD right eye; OS left eye; OU both eyes  XT exotropia; ET esotropia; PEK punctate epithelial keratitis; PEE punctate epithelial erosions; DES dry eye syndrome; MGD meibomian gland dysfunction; ATs artificial tears; PFAT's  preservative free artificial tears; NSC nuclear sclerotic cataract; PSC posterior subcapsular cataract; ERM epi-retinal membrane; PVD posterior vitreous detachment; RD retinal detachment; DM diabetes mellitus; DR diabetic retinopathy; NPDR non-proliferative diabetic retinopathy; PDR proliferative diabetic retinopathy; CSME clinically significant macular edema; DME diabetic macular edema; dbh dot blot hemorrhages; CWS cotton wool spot; POAG primary open angle glaucoma; C/D cup-to-disc ratio; HVF humphrey visual field; GVF goldmann visual field; OCT optical coherence tomography; IOP intraocular pressure; BRVO Branch retinal vein occlusion; CRVO central retinal vein occlusion; CRAO central retinal artery occlusion; BRAO branch retinal artery occlusion; RT retinal tear; SB scleral buckle; PPV pars plana vitrectomy; VH Vitreous hemorrhage; PRP panretinal laser photocoagulation; IVK intravitreal kenalog; VMT vitreomacular traction; MH Macular hole;  NVD neovascularization of the disc; NVE neovascularization elsewhere; AREDS age related eye disease study; ARMD age related macular degeneration; POAG primary open angle glaucoma; EBMD epithelial/anterior basement membrane dystrophy; ACIOL anterior chamber intraocular lens; IOL intraocular lens; PCIOL posterior chamber intraocular lens; Phaco/IOL phacoemulsification with intraocular lens placement; PRK photorefractive keratectomy; LASIK laser assisted in situ keratomileusis; HTN hypertension; DM diabetes mellitus; COPD chronic obstructive pulmonary disease

## 2022-10-26 ENCOUNTER — Encounter (INDEPENDENT_AMBULATORY_CARE_PROVIDER_SITE_OTHER): Payer: Medicare Other | Admitting: Ophthalmology

## 2022-10-26 ENCOUNTER — Encounter (INDEPENDENT_AMBULATORY_CARE_PROVIDER_SITE_OTHER): Payer: Self-pay | Admitting: Ophthalmology

## 2022-10-26 ENCOUNTER — Ambulatory Visit (INDEPENDENT_AMBULATORY_CARE_PROVIDER_SITE_OTHER): Payer: Medicare Other | Admitting: Ophthalmology

## 2022-10-26 DIAGNOSIS — I10 Essential (primary) hypertension: Secondary | ICD-10-CM | POA: Diagnosis not present

## 2022-10-26 DIAGNOSIS — H353231 Exudative age-related macular degeneration, bilateral, with active choroidal neovascularization: Secondary | ICD-10-CM | POA: Diagnosis not present

## 2022-10-26 DIAGNOSIS — Z961 Presence of intraocular lens: Secondary | ICD-10-CM | POA: Diagnosis not present

## 2022-10-26 DIAGNOSIS — E119 Type 2 diabetes mellitus without complications: Secondary | ICD-10-CM

## 2022-10-26 DIAGNOSIS — H35033 Hypertensive retinopathy, bilateral: Secondary | ICD-10-CM | POA: Diagnosis not present

## 2022-10-26 DIAGNOSIS — H353221 Exudative age-related macular degeneration, left eye, with active choroidal neovascularization: Secondary | ICD-10-CM

## 2022-10-26 DIAGNOSIS — Z7984 Long term (current) use of oral hypoglycemic drugs: Secondary | ICD-10-CM | POA: Diagnosis not present

## 2022-10-26 DIAGNOSIS — H04123 Dry eye syndrome of bilateral lacrimal glands: Secondary | ICD-10-CM

## 2022-10-26 DIAGNOSIS — H353211 Exudative age-related macular degeneration, right eye, with active choroidal neovascularization: Secondary | ICD-10-CM

## 2022-10-26 MED ORDER — AFLIBERCEPT 2MG/0.05ML IZ SOLN FOR KALEIDOSCOPE
2.0000 mg | INTRAVITREAL | Status: AC | PRN
Start: 2022-10-26 — End: 2022-10-26
  Administered 2022-10-26: 2 mg via INTRAVITREAL

## 2022-10-27 ENCOUNTER — Other Ambulatory Visit (INDEPENDENT_AMBULATORY_CARE_PROVIDER_SITE_OTHER): Payer: Self-pay | Admitting: Ophthalmology

## 2022-10-27 ENCOUNTER — Other Ambulatory Visit (INDEPENDENT_AMBULATORY_CARE_PROVIDER_SITE_OTHER): Payer: Self-pay

## 2022-10-27 MED ORDER — RESTASIS 0.05 % OP EMUL: 1.0000 [drp] | Freq: Two times a day (BID) | OPHTHALMIC | 6 refills | Status: AC

## 2022-11-09 ENCOUNTER — Ambulatory Visit: Payer: Medicare Other | Admitting: Podiatry

## 2022-11-09 ENCOUNTER — Encounter: Payer: Self-pay | Admitting: Podiatry

## 2022-11-09 DIAGNOSIS — M79674 Pain in right toe(s): Secondary | ICD-10-CM | POA: Diagnosis not present

## 2022-11-09 DIAGNOSIS — B351 Tinea unguium: Secondary | ICD-10-CM | POA: Diagnosis not present

## 2022-11-09 DIAGNOSIS — M79675 Pain in left toe(s): Secondary | ICD-10-CM | POA: Diagnosis not present

## 2022-11-09 NOTE — Progress Notes (Signed)
Subjective:  Patient ID: Sue [name], female    DOB: 06-04-1932,  MRN: [id number]  Sue [name] presents to clinic today for at risk foot care with history of diabetic neuropathy and painful thick toenails that are difficult to trim. Painful toenails interfere with ambulation. Aggravating factors include wearing enclosed shoe gear. Pain is relieved with periodic professional debridement.  Pain is relieved with periodic professional debridement.  Chief Complaint  Patient presents with   Diabetes    [facility] BS - 140 A1C - 7.3   New problem(s): None.   PCP is [name], MD.  No Known Allergies  Review of Systems: Negative except as noted in the HPI.  Objective: No changes noted in today's physical examination. There were no vitals filed for this visit. Sue [name] is a pleasant 89 y.o. female WD, WN in NAD. AAO x 3.  Vascular Examination: CFT <3 seconds b/l LE. Palpable DP pulse(s) b/l LE. Palpable PT pulse(s) b/l LE. Pedal hair absent. No pain with calf compression b/l. Lower extremity skin temperature gradient within normal limits. No edema noted b/l LE. No cyanosis or clubbing noted b/l LE.  Dermatological Examination: Pedal skin is warm and supple b/l LE. No open wounds b/l LE. No interdigital macerations noted b/l LE.   Toenails 1-5 left foot, 3-5 right foot, and right great toe elongated, discolored, dystrophic, thickened, and crumbly with subungual debris and tenderness to dorsal palpation.   Mild hyperkeratotic tissue to dorsal left 2nd digit  Neurological Examination: Protective sensation diminished with 10g monofilament b/l.  Musculoskeletal Examination: Muscle strength 5/5 to all lower extremity muscle groups bilaterally. No pain, crepitus or joint limitation noted with ROM bilateral LE. Right 2nd toe mild hammer toe. Lower extremity amputation(s): partial amputation of right second digit. Utilizes rollator for ambulation  assistance.  Assessment/Plan: 1. Pain due to onychomycosis of toenails of both feet     -Patient's family member present. All questions/concerns addressed on today's visit. -Examined patient. -Toenails were debrided in length and girth X 9 with sterile nail nippers and dremel without iatrogenic bleeding.  -Tube foam dispensed to pad 2nd toes bilaterally  -Patient/POA to call should there be question/concern in the interim.   Return in about 3 months (around 02/09/2023) for Routine Foot Care.  Barbaraann Share, DPM

## 2022-11-16 NOTE — Telephone Encounter (Signed)
Pt advised to establish w/ general ophthalmologist [facility]

## 2022-11-18 DIAGNOSIS — X32XXXD Exposure to sunlight, subsequent encounter: Secondary | ICD-10-CM | POA: Diagnosis not present

## 2022-11-18 DIAGNOSIS — Z85828 Personal history of other malignant neoplasm of skin: Secondary | ICD-10-CM | POA: Diagnosis not present

## 2022-11-18 DIAGNOSIS — Z08 Encounter for follow-up examination after completed treatment for malignant neoplasm: Secondary | ICD-10-CM | POA: Diagnosis not present

## 2022-11-18 DIAGNOSIS — L570 Actinic keratosis: Secondary | ICD-10-CM | POA: Diagnosis not present

## 2022-11-18 DIAGNOSIS — C44612 Basal cell carcinoma of skin of right upper limb, including shoulder: Secondary | ICD-10-CM | POA: Diagnosis not present

## 2022-11-30 ENCOUNTER — Encounter (INDEPENDENT_AMBULATORY_CARE_PROVIDER_SITE_OTHER): Payer: Medicare Other | Admitting: Ophthalmology

## 2022-12-02 ENCOUNTER — Encounter (INDEPENDENT_AMBULATORY_CARE_PROVIDER_SITE_OTHER): Payer: Medicare Other | Admitting: Ophthalmology

## 2022-12-02 NOTE — Progress Notes (Signed)
[facility] - Clinic Note  12/03/2022   CHIEF COMPLAINT Patient presents for Retina Follow Up  HISTORY OF PRESENT ILLNESS: Sue [name] is a 89 y.o. female who presents to the clinic today for:  HPI     Retina Follow Up   Patient presents with  Wet AMD.  In left eye.  Severity is moderate.  Duration of 5 weeks.  Since onset it is stable.  I, the attending physician,  performed the HPI with the patient and updated documentation appropriately.        Comments   Pt here for 5 wk ret f/u exu ARMD OS. Pt sates VA is about the same, doesn't see the floater in OD as prominently. Having watery eyes OU.       Last edited by Rennis Chris, MD on 12/03/2022  3:30 PM.     Referring physician: [name], MD [address] [place],  [place] [address]  HISTORICAL INFORMATION:   Selected notes from the MEDICAL RECORD NUMBER Referred by Dr. Baker Pierini for concern of ARMD OU   CURRENT MEDICATIONS: Current Outpatient Medications (Ophthalmic Drugs)  Medication Sig   Polyethyl Glycol-Propyl Glycol (SYSTANE) 0.4-0.3 % SOLN Place 1 drop into both eyes 3 (three) times daily as needed (dry eyes).   RESTASIS 0.05 % ophthalmic emulsion Place 1 drop into both eyes 2 (two) times daily.   No current facility-administered medications for this visit. (Ophthalmic Drugs)   Current Outpatient Medications (Other)  Medication Sig   amLODipine (NORVASC) 10 MG tablet Take 1 tablet by mouth daily.   B-D ULTRAFINE III SHORT PEN 31G X 8 MM MISC USE AS DIRECTED ONCE DAILY SUBCUTANEOUSLY   Calcium Citrate-Vitamin D (CALCIUM + D PO)    calcium-vitamin D (OSCAL WITH D) 500-200 MG-UNIT tablet Take 1 tablet by mouth 2 (two) times daily.    Continuous Blood Gluc Sensor (FREESTYLE LIBRE 2 SENSOR) MISC See admin instructions.   Cyanocobalamin (B-12 PO)    diphenhydramine-acetaminophen (TYLENOL PM) 25-500 MG TABS tablet Take 1 tablet by mouth at bedtime as needed (sleep).    Glucagon (GVOKE HYPOPEN 2-PACK) 0.5 MG/0.1ML SOAJ as needed for hypoglycemia   glucose blood test strip OneTouch Ultra Test strips  TEST ONCE A DAY ONCE A DAY FINGERSTICK 90   hydrALAZINE (APRESOLINE) 25 MG tablet Take 3 tablets (75 mg total) by mouth every 8 (eight) hours.   hydrochlorothiazide (HYDRODIURIL) 25 MG tablet Take 25 mg by mouth daily.   ibuprofen (ADVIL) 200 MG tablet Take 400 mg by mouth every 6 (six) hours as needed for moderate pain.   Insulin Degludec (TRESIBA FLEXTOUCH) 200 UNIT/ML SOPN Inject 36 Units into the skin at bedtime. (Patient taking differently: Inject 22 Units into the skin at bedtime.)   Insulin Pen Needle (PEN NEEDLES) 32G X 4 MM MISC BD Ultra-Fine Nano Pen Needle 32 gauge x 5/32"  USE AS DIRECTED DAILY   ketoconazole (NIZORAL) 2 % cream Apply topically.   Lancets (ONETOUCH ULTRASOFT) lancets OneTouch UltraSoft Lancets  USE TWICE DAILY   Melatonin 10 MG CAPS Take 10 mg by mouth at bedtime as needed (sleep).   meloxicam (MOBIC) 7.5 MG tablet Take 1 tablet (7.5 mg total) by mouth daily as needed for pain.   Multiple Vitamins-Minerals (ADULT ONE DAILY GUMMIES) CHEW Chew 1 tablet by mouth 2 (two) times daily. Centrum   Multiple Vitamins-Minerals (AIRBORNE) TBEF Take 1 tablet by mouth daily as needed (immune support).   Multiple Vitamins-Minerals (CENTRUM  SILVER PO)    Multiple Vitamins-Minerals (PRESERVISION AREDS 2) CAPS Take 1 capsule by mouth 2 (two) times daily.   mupirocin ointment (BACTROBAN) 2 % Apply to right foot ulcer once daily. (Patient taking differently: Apply 1 application  topically daily as needed (wound care). Apply to right foot ulcer once daily.)   nortriptyline (PAMELOR) 25 MG capsule Take 1 capsule (25 mg total) by mouth at bedtime. (Patient taking differently: Take 25 mg by mouth 2 (two) times daily.)   Omega-3 Fatty Acids (FISH OIL) 1200 MG CAPS Take 1,200 mg by mouth daily with lunch.    omeprazole (PRILOSEC) 20 MG capsule Take 1 capsule  by mouth daily.   ondansetron (ZOFRAN) 4 MG tablet Take 4 mg by mouth 3 (three) times daily as needed.   ONE TOUCH ULTRA TEST test strip    PRESCRIPTION MEDICATION by Intravitreal route every 30 (thirty) days. Ophthalmic injection at MD office in left eye   silver sulfADIAZINE (SILVADENE) 1 % cream Apply pea-sized amount to wound daily. (Patient taking differently: Apply 1 application  topically daily. Apply pea-sized amount to wound daily.)   simvastatin (ZOCOR) 20 MG tablet Take 1 tablet by mouth daily.   traMADol (ULTRAM) 50 MG tablet Take 1 tablet (50 mg total) by mouth 2 (two) times daily as needed.   vitamin B-12 (CYANOCOBALAMIN) 1000 MCG tablet Take 1,000 mcg by mouth 2 (two) times daily.    Current Facility-Administered Medications (Other)  Medication Route   Bevacizumab (AVASTIN) SOLN 1.25 mg Intravitreal   Bevacizumab (AVASTIN) SOLN 1.25 mg Intravitreal   REVIEW OF SYSTEMS: ROS   Positive for: Neurological, Skin, Genitourinary, Musculoskeletal, Endocrine, Eyes Negative for: Constitutional, Gastrointestinal, HENT, Cardiovascular, Respiratory, Psychiatric, Allergic/Imm, Heme/Lymph Last edited by Thompson Grayer, COT on 12/03/2022 12:53 PM.      ALLERGIES No Known Allergies  PAST MEDICAL HISTORY Past Medical History:  Diagnosis Date   Anemia    Arthritis    Basal cell carcinoma 11/23/2017   left elbow, right neck TX CX3 5FU    Basal cell carcinoma 04/04/2019   infil-right sideburn (CX35FU)   Chronic kidney disease    stage 3 ckd stage 3 b per dr Nicholos Johns 09-26-2019 note on chart   Complication of anesthesia    deifficulty waking up   COVID-19    Diabetic neuropathy (HCC)    Hyperlipidemia    Hypertension    Hypertensive retinopathy    OU   Infiltrative basal cell carcinoma (BCC) 04/22/2020   Right Zygomatic Area    Macular degeneration    OU   Nodular basal cell carcinoma (BCC) 04/22/2020   Right Anterior Neck   Pneumonia    Right carotid bruit per dr  Nicholos Johns 09-26-2019 note on chart   SCC (squamous cell carcinoma) 11/23/2017   right forehead TX CX3 5FU   SCCA (squamous cell carcinoma) of skin 04/22/2020   Mid Parietal Scalp (in situ)   SCCA (squamous cell carcinoma) of skin 04/22/2020   Right Dorsal Hand (in situ)   Type 2 DM with CKD stage 3 and hypertension (HCC)    Past Surgical History:  Procedure Laterality Date   ABDOMINAL HYSTERECTOMY     AMPUTATION TOE Right 09/28/2019   Procedure: Right 2nd toe amputation - partial;  Surgeon: Park Liter, DPM;  Location: [facility];  Service: Podiatry;  Laterality: Right;   CATARACT EXTRACTION     CHOLECYSTECTOMY     EYE SURGERY     HERNIA REPAIR  SPINE SURGERY     TONSILLECTOMY     FAMILY HISTORY History reviewed. No pertinent family history.  SOCIAL HISTORY Social History   Tobacco Use   Smoking status: Former   Smokeless tobacco: Never  Advertising account planner   Vaping status: Never Used  Substance Use Topics   Alcohol use: No    Alcohol/week: 0.0 standard drinks of alcohol   Drug use: No       OPHTHALMIC EXAM:  Base Eye Exam     Visual Acuity (Snellen - Linear)       Right Left   Dist cc 20/40 20/70 -1   Dist ph cc NI 20/60 -2    Correction: Glasses         Tonometry (Tonopen, 12:59 PM)       Right Left   Pressure 16 17         Pupils       Pupils Dark Light Shape React APD   Right PERRL 3 2 Round Brisk None   Left PERRL 3 2 Round Brisk None         Visual Fields (Counting fingers)       Left Right    Full Full         Extraocular Movement       Right Left    Full, Ortho Full, Ortho         Neuro/Psych     Oriented x3: Yes   Mood/Affect: Normal         Dilation     Both eyes: 1.0% Mydriacyl, 2.5% Phenylephrine @ 1:00 PM           Slit Lamp and Fundus Exam     Slit Lamp Exam       Right Left   Lids/Lashes Dermatochalasis - upper lid, Telangiectasia, mild MGD Dermatochalasis - upper lid,  Telangiectasia, Meibomian gland dysfunction   Conjunctiva/Sclera White and quiet White and quiet   Cornea Temporal Well healed cataract wounds, mild Arcus, trace endo pigment nasally, 1+PEE Arcus, Temporal Well healed cataract wounds, 1-2+ PEE, tear film debris   Anterior Chamber Deep and quiet Deep and quiet   Iris Round and moderately dilated to 4.52mm Round and moderately dilated to 5mm, Iris atrophy from 0100 to 0230, peripheral iridotomy at 0200   Lens 3-piece PC IOL in good position PC IOL in good position   Anterior Vitreous Vitreous syneresis, Posterior vitreous detachment, Weiss ring, vitreous condensations Vitreous syneresis, PVD, vitreous condensations         Fundus Exam       Right Left   Disc 360 Peripapillary atrophy, diffuse mild pallor, compact, sharp rim mild pallor, Peripapillary atrophy and pigmentation   C/D Ratio 0.1 0.2   Macula Blunted foveal reflex, +PED / CNV, RPE mottling, clumping and early atrophy, Drusen, No heme Blunted foveal reflex, low lying PED / +CNVM with stable improvement in IRF, mild interval improvement in shallow SRF, RPE mottling and clumping, Drusen, No heme, early atrophy   Vessels Attenuated, mild tortuosity Attenuated, mild tortuosity   Periphery Attached, 360 Reticular degeneration, No heme Attached, 360 Reticular degeneration, No heme           Refraction     Wearing Rx       Sphere Cylinder Axis Add   Right -2.00 +1.25 161 +2.50   Left +0.25 +0.50 004 +2.50           IMAGING AND PROCEDURES  Imaging and Procedures for @TODAY @  OCT,  Retina - OU - Both Eyes       Right Eye Quality was good. Central Foveal Thickness: 202. Progression has improved. Findings include no IRF, no SRF, abnormal foveal contour, retinal drusen , subretinal hyper-reflective material, intraretinal hyper-reflective material, pigment epithelial detachment, outer retinal atrophy (Interval improvement in focal [facility] and central PED, stable improvement in  [facility]).   Left Eye Quality was good. Central Foveal Thickness: 221. Progression has improved. Findings include normal foveal contour, no IRF, no SRF, retinal drusen , epiretinal membrane, pigment epithelial detachment, outer retinal atrophy (interval improvement in shallow SRF and low lying PED; patchy central ORA, partial PVD).   Notes *Images captured and stored on drive  Diagnosis / Impression:  OD: Interval improvement in focal SRHM and central PED, stable improvement in IRHM OS: Exudative ARMD -- interval improvement in shallow SRF and low lying PED; patchy central ORA, partial PVD  Clinical management:  See below  Abbreviations: NFP - Normal foveal profile. CME - cystoid macular edema. PED - pigment epithelial detachment. IRF - intraretinal fluid. SRF - subretinal fluid. EZ - ellipsoid zone. ERM - epiretinal membrane. ORA - outer retinal atrophy. ORT - outer retinal tubulation. SRHM - subretinal hyper-reflective material       Intravitreal Injection, Pharmacologic Agent - OD - Right Eye       Time Out 12/03/2022. 1:27 PM. Confirmed correct patient, procedure, site, and patient consented.   Anesthesia Topical anesthesia was used. Anesthetic medications included Lidocaine 2%, Proparacaine 0.5%.   Procedure Preparation included 5% betadine to ocular surface, eyelid speculum. A (32g) needle was used.   Injection: 2 mg aflibercept 2 MG/0.05ML   Route: Intravitreal, Site: Right Eye   NDC: L6038910, Lot: [id number], Expiration date: 09/25/2023, Waste: 0 mL   Post-op Post injection exam found visual acuity of at least counting fingers. The patient tolerated the procedure well. There were no complications. The patient received written and verbal post procedure care education.      Intravitreal Injection, Pharmacologic Agent - OS - Left Eye       Time Out 12/03/2022. 1:29 PM. Confirmed correct patient, procedure, site, and patient consented.   Anesthesia Topical  anesthesia was used. Anesthetic medications included Lidocaine 2%, Proparacaine 0.5%.   Procedure Preparation included 5% betadine to ocular surface, eyelid speculum. A (32g) needle was used.   Injection: 2 mg aflibercept 2 MG/0.05ML   Route: Intravitreal, Site: Left Eye   NDC: L6038910, Lot: [id number], Expiration date: 01/25/2024, Waste: 0 mL   Post-op Post injection exam found visual acuity of at least counting fingers. The patient tolerated the procedure well. There were no complications. The patient received written and verbal post procedure care education. Post injection medications were not given.            ASSESSMENT/PLAN:    ICD-10-CM   1. Exudative age-related macular degeneration of left eye with active choroidal neovascularization (HCC)  H35.3221 OCT, Retina - OU - Both Eyes    Intravitreal Injection, Pharmacologic Agent - OS - Left Eye    aflibercept (EYLEA) SOLN 2 mg    2. Exudative age-related macular degeneration of right eye with active choroidal neovascularization (HCC)  H35.3211 Intravitreal Injection, Pharmacologic Agent - OD - Right Eye    aflibercept (EYLEA) SOLN 2 mg    3. Diabetes mellitus type 2 without retinopathy (HCC)  E11.9     4. Long term (current) use of oral hypoglycemic drugs  Z79.84     5. Essential hypertension  I10  6. Hypertensive retinopathy of both eyes  H35.033     7. Pseudophakia of both eyes  Z96.1     8. Dry eyes  H04.123      1. Exudative age-related macular degeneration, left eye - conversion of OS from nonexudative to exudative ARMD prior to 02.05.20 visit - s/p IVA OS #1 (02.05.20), #2 (03.04.20), #3 (04.30.20), #4 (06.02.20), #5 (07.14.20), #6 (9.1.20), #7 (10.27.20), #8 (12.23.20), #9 (02.03.21), #10 (03.17.21), #11 (04.28.21), #12 (06.09.21), #13 (08.04.21), #14 (09.29.21), #15 (11.22.2021), #16 (01.26.22), #17 (04.06.22), #18 (06.28.22), #19 (9.13.22), #20 (11.29.22), #21 (01.31.23), #22 (04.04.23), #23 (05.30.23),  #24 (07.27.23), #25 (09.19.23), #26 (11.07.23), #27 (12.29.23), #28 (02.14.24), #29 (06.04.24), #30 (07.16.24), #31 (08.27.24) -- IVA resistance - s/p IVE OS #1 (10.01.24) **history of recurrent / worsening SRF at 9 wk interval, noted on 04.04.23 visit, at 8 week interval on 09.19.23 and 06.04.24** - OCT shows OS: Exudative ARMD -- interval improvement in shallow SRF and low lying PED; patchy central ORA, partial PVD at 5 weeks  - BCVA OS 20/70 - stable  - recommend IVE OS #2 today, 11.07.24 w/ f/u at 5 wks again  - pt wishes to proceed with injection - RBA of procedure discussed, questions answered  - see procedure notes  - Eylea informed consent obtained, signed and scanned 10.01.24 (OU)  - Eylea approved for 2024  - f/u in 5 wks for DFE, OCT, likely injection OS   2. Age related macular degeneration, non-exudative, right eye  - s/p IVA OD #1 (06.04.24), #2 (07.16.24), #3 (08.27.24) -- IVA resistance  - s/p IVE OD #1 (10.01.24)  - conversion to exu ARMD on 06.04.24 exam - OCT OD: Interval improvement in focal [facility] and central PED, stable improvement in [facility] at 5 weeks  - recommend IVE OD #2 today, 11.08.24 with follow up at 5 weeks again  - pt wishes to proceed with injection  - RBA of procedure discussed, questions answered - IVE informed consent obtained and signed, 10.01.24 (OU) - see procedure note  - f/u in 5 wks, DFE, OCT  3,4. Diabetes mellitus, type 2 without retinopathy - The incidence, risk factors for progression, natural history and treatment options for diabetic retinopathy  were discussed with patient.   - The need for close monitoring of blood glucose, blood pressure, and serum lipids, avoiding cigarette or any type of tobacco, and the need for long term follow up was also discussed with patient.  - f/u in 1 year  5,6. Hypertensive retinopathy OU  - discussed importance of tight BP control  - continue to monitor  7. Pseudophakia OU  - s/p CE/IOL OU  - beautiful  surgeries, doing well  - continue to monitor  8. Dry eyes OU  - pt on restasis BID OU - recommend artificial tears and lubricating ointment as needed   Ophthalmic Meds Ordered this visit:  Meds ordered this encounter  Medications   aflibercept (EYLEA) SOLN 2 mg   aflibercept (EYLEA) SOLN 2 mg    Return in about 5 weeks (around 01/07/2023) for f/u exu ARMD OU, DFE, OCT.  There are no Patient Instructions on file for this visit.   This document serves as a record of services personally performed by Karie Chimera, MD, PhD. It was created on their behalf by Berlin Hun COT, an ophthalmic technician. The creation of this record is the provider's dictation and/or activities during the visit.    Electronically signed by: Berlin Hun COT 11.07.24 1:44 AM  This  document serves as a record of services personally performed by Karie Chimera, MD, PhD. It was created on their behalf by Glee Arvin. Manson Passey, OA an ophthalmic technician. The creation of this record is the provider's dictation and/or activities during the visit.    Electronically signed by: Glee Arvin. Manson Passey, OA 12/05/22 1:44 AM  Karie Chimera, M.D., Ph.D. Diseases & Surgery of the Retina and Vitreous [facility] 12/03/2022   I have reviewed the above documentation for accuracy and completeness, and I agree with the above. Karie Chimera, M.D., Ph.D. 12/05/22 1:46 AM   Abbreviations: M myopia (nearsighted); A astigmatism; H hyperopia (farsighted); P presbyopia; Mrx spectacle prescription;  CTL contact lenses; OD right eye; OS left eye; OU both eyes  XT exotropia; ET esotropia; PEK punctate epithelial keratitis; PEE punctate epithelial erosions; DES dry eye syndrome; MGD meibomian gland dysfunction; ATs artificial tears; PFAT's preservative free artificial tears; NSC nuclear sclerotic cataract; PSC posterior subcapsular cataract; ERM epi-retinal membrane; PVD posterior vitreous detachment; RD  retinal detachment; DM diabetes mellitus; DR diabetic retinopathy; NPDR non-proliferative diabetic retinopathy; PDR proliferative diabetic retinopathy; CSME clinically significant macular edema; DME diabetic macular edema; dbh dot blot hemorrhages; CWS cotton wool spot; POAG primary open angle glaucoma; C/D cup-to-disc ratio; HVF humphrey visual field; GVF goldmann visual field; OCT optical coherence tomography; IOP intraocular pressure; BRVO Branch retinal vein occlusion; CRVO central retinal vein occlusion; CRAO central retinal artery occlusion; BRAO branch retinal artery occlusion; RT retinal tear; SB scleral buckle; PPV pars plana vitrectomy; VH Vitreous hemorrhage; PRP panretinal laser photocoagulation; IVK intravitreal kenalog; VMT vitreomacular traction; MH Macular hole;  NVD neovascularization of the disc; NVE neovascularization elsewhere; AREDS age related eye disease study; ARMD age related macular degeneration; POAG primary open angle glaucoma; EBMD epithelial/anterior basement membrane dystrophy; ACIOL anterior chamber intraocular lens; IOL intraocular lens; PCIOL posterior chamber intraocular lens; Phaco/IOL phacoemulsification with intraocular lens placement; PRK photorefractive keratectomy; LASIK laser assisted in situ keratomileusis; HTN hypertension; DM diabetes mellitus; COPD chronic obstructive pulmonary disease

## 2022-12-03 ENCOUNTER — Ambulatory Visit (INDEPENDENT_AMBULATORY_CARE_PROVIDER_SITE_OTHER): Payer: Medicare Other | Admitting: Ophthalmology

## 2022-12-03 ENCOUNTER — Encounter (INDEPENDENT_AMBULATORY_CARE_PROVIDER_SITE_OTHER): Payer: Self-pay | Admitting: Ophthalmology

## 2022-12-03 DIAGNOSIS — Z7984 Long term (current) use of oral hypoglycemic drugs: Secondary | ICD-10-CM | POA: Diagnosis not present

## 2022-12-03 DIAGNOSIS — E119 Type 2 diabetes mellitus without complications: Secondary | ICD-10-CM | POA: Diagnosis not present

## 2022-12-03 DIAGNOSIS — Z961 Presence of intraocular lens: Secondary | ICD-10-CM

## 2022-12-03 DIAGNOSIS — I10 Essential (primary) hypertension: Secondary | ICD-10-CM | POA: Diagnosis not present

## 2022-12-03 DIAGNOSIS — H353211 Exudative age-related macular degeneration, right eye, with active choroidal neovascularization: Secondary | ICD-10-CM

## 2022-12-03 DIAGNOSIS — H353221 Exudative age-related macular degeneration, left eye, with active choroidal neovascularization: Secondary | ICD-10-CM

## 2022-12-03 DIAGNOSIS — H353231 Exudative age-related macular degeneration, bilateral, with active choroidal neovascularization: Secondary | ICD-10-CM

## 2022-12-03 DIAGNOSIS — H35033 Hypertensive retinopathy, bilateral: Secondary | ICD-10-CM

## 2022-12-03 DIAGNOSIS — H04123 Dry eye syndrome of bilateral lacrimal glands: Secondary | ICD-10-CM

## 2022-12-03 MED ORDER — AFLIBERCEPT 2MG/0.05ML IZ SOLN FOR KALEIDOSCOPE
2.0000 mg | INTRAVITREAL | Status: AC | PRN
Start: 2022-12-03 — End: 2022-12-03
  Administered 2022-12-03: 2 mg via INTRAVITREAL

## 2022-12-27 NOTE — Progress Notes (Signed)
[facility] - Clinic Note  01/04/2023   CHIEF COMPLAINT Patient presents for Retina Follow Up  HISTORY OF PRESENT ILLNESS: Sue [name] is a 89 y.o. female who presents to the clinic today for:  HPI     Retina Follow Up   Patient presents with  Wet AMD.  In both eyes.  This started 5 weeks ago.  I, the attending physician,  performed the HPI with the patient and updated documentation appropriately.        Comments   Patient here for 5 weeks retina follow up for exu ARMD OU. Patient states vision about the same. Eyes still sometimes waters. Has a floater on occasion when bends over. No eye pain.       Last edited by Rennis Chris, MD on 01/04/2023  7:33 PM.      Referring physician: [name], MD [address] [place],  [place] [address]  HISTORICAL INFORMATION:   Selected notes from the MEDICAL RECORD NUMBER Referred by Dr. Baker Pierini for concern of ARMD OU   CURRENT MEDICATIONS: Current Outpatient Medications (Ophthalmic Drugs)  Medication Sig   RESTASIS 0.05 % ophthalmic emulsion Place 1 drop into both eyes 2 (two) times daily.   Polyethyl Glycol-Propyl Glycol (SYSTANE) 0.4-0.3 % SOLN Place 1 drop into both eyes 3 (three) times daily as needed (dry eyes). (Patient not taking: Reported on 01/04/2023)   No current facility-administered medications for this visit. (Ophthalmic Drugs)   Current Outpatient Medications (Other)  Medication Sig   amLODipine (NORVASC) 10 MG tablet Take 1 tablet by mouth daily.   B-D ULTRAFINE III SHORT PEN 31G X 8 MM MISC USE AS DIRECTED ONCE DAILY SUBCUTANEOUSLY   Calcium Citrate-Vitamin D (CALCIUM + D PO)    calcium-vitamin D (OSCAL WITH D) 500-200 MG-UNIT tablet Take 1 tablet by mouth 2 (two) times daily.    Continuous Blood Gluc Sensor (FREESTYLE LIBRE 2 SENSOR) MISC See admin instructions.   Cyanocobalamin (B-12 PO)    diphenhydramine-acetaminophen (TYLENOL PM) 25-500 MG TABS tablet Take  1 tablet by mouth at bedtime as needed (sleep).   Glucagon (GVOKE HYPOPEN 2-PACK) 0.5 MG/0.1ML SOAJ as needed for hypoglycemia   glucose blood test strip OneTouch Ultra Test strips  TEST ONCE A DAY ONCE A DAY FINGERSTICK 90   hydrALAZINE (APRESOLINE) 25 MG tablet Take 3 tablets (75 mg total) by mouth every 8 (eight) hours.   hydrochlorothiazide (HYDRODIURIL) 25 MG tablet Take 25 mg by mouth daily.   ibuprofen (ADVIL) 200 MG tablet Take 400 mg by mouth every 6 (six) hours as needed for moderate pain.   Insulin Degludec (TRESIBA FLEXTOUCH) 200 UNIT/ML SOPN Inject 36 Units into the skin at bedtime. (Patient taking differently: Inject 22 Units into the skin at bedtime.)   Insulin Pen Needle (PEN NEEDLES) 32G X 4 MM MISC BD Ultra-Fine Nano Pen Needle 32 gauge x 5/32"  USE AS DIRECTED DAILY   ketoconazole (NIZORAL) 2 % cream Apply topically.   Lancets (ONETOUCH ULTRASOFT) lancets OneTouch UltraSoft Lancets  USE TWICE DAILY   Melatonin 10 MG CAPS Take 10 mg by mouth at bedtime as needed (sleep).   meloxicam (MOBIC) 7.5 MG tablet Take 1 tablet (7.5 mg total) by mouth daily as needed for pain.   Multiple Vitamins-Minerals (ADULT ONE DAILY GUMMIES) CHEW Chew 1 tablet by mouth 2 (two) times daily. Centrum   Multiple Vitamins-Minerals (AIRBORNE) TBEF Take 1 tablet by mouth daily as needed (immune support).   Multiple  Vitamins-Minerals (CENTRUM SILVER PO)    Multiple Vitamins-Minerals (PRESERVISION AREDS 2) CAPS Take 1 capsule by mouth 2 (two) times daily.   mupirocin ointment (BACTROBAN) 2 % Apply to right foot ulcer once daily. (Patient taking differently: Apply 1 application  topically daily as needed (wound care). Apply to right foot ulcer once daily.)   nortriptyline (PAMELOR) 25 MG capsule Take 1 capsule (25 mg total) by mouth at bedtime. (Patient taking differently: Take 25 mg by mouth 2 (two) times daily.)   Omega-3 Fatty Acids (FISH OIL) 1200 MG CAPS Take 1,200 mg by mouth daily with lunch.     omeprazole (PRILOSEC) 20 MG capsule Take 1 capsule by mouth daily.   ondansetron (ZOFRAN) 4 MG tablet Take 4 mg by mouth 3 (three) times daily as needed.   ONE TOUCH ULTRA TEST test strip    PRESCRIPTION MEDICATION by Intravitreal route every 30 (thirty) days. Ophthalmic injection at MD office in left eye   silver sulfADIAZINE (SILVADENE) 1 % cream Apply pea-sized amount to wound daily. (Patient taking differently: Apply 1 application  topically daily. Apply pea-sized amount to wound daily.)   simvastatin (ZOCOR) 20 MG tablet Take 1 tablet by mouth daily.   traMADol (ULTRAM) 50 MG tablet Take 1 tablet (50 mg total) by mouth 2 (two) times daily as needed.   vitamin B-12 (CYANOCOBALAMIN) 1000 MCG tablet Take 1,000 mcg by mouth 2 (two) times daily.    Current Facility-Administered Medications (Other)  Medication Route   Bevacizumab (AVASTIN) SOLN 1.25 mg Intravitreal   Bevacizumab (AVASTIN) SOLN 1.25 mg Intravitreal   REVIEW OF SYSTEMS: ROS   Positive for: Neurological, Skin, Genitourinary, Musculoskeletal, Endocrine, Eyes Negative for: Constitutional, Gastrointestinal, HENT, Cardiovascular, Respiratory, Psychiatric, Allergic/Imm, Heme/Lymph Last edited by Laddie Aquas, COA on 01/04/2023  1:11 PM.     ALLERGIES No Known Allergies  PAST MEDICAL HISTORY Past Medical History:  Diagnosis Date   Anemia    Arthritis    Basal cell carcinoma 11/23/2017   left elbow, right neck TX CX3 5FU    Basal cell carcinoma 04/04/2019   infil-right sideburn (CX35FU)   Chronic kidney disease    stage 3 ckd stage 3 b per dr Nicholos Johns 09-26-2019 note on chart   Complication of anesthesia    deifficulty waking up   COVID-19    Diabetic neuropathy (HCC)    Hyperlipidemia    Hypertension    Hypertensive retinopathy    OU   Infiltrative basal cell carcinoma (BCC) 04/22/2020   Right Zygomatic Area    Macular degeneration    OU   Nodular basal cell carcinoma (BCC) 04/22/2020   Right Anterior  Neck   Pneumonia    Right carotid bruit per dr Nicholos Johns 09-26-2019 note on chart   SCC (squamous cell carcinoma) 11/23/2017   right forehead TX CX3 5FU   SCCA (squamous cell carcinoma) of skin 04/22/2020   Mid Parietal Scalp (in situ)   SCCA (squamous cell carcinoma) of skin 04/22/2020   Right Dorsal Hand (in situ)   Type 2 DM with CKD stage 3 and hypertension (HCC)    Past Surgical History:  Procedure Laterality Date   ABDOMINAL HYSTERECTOMY     AMPUTATION TOE Right 09/28/2019   Procedure: Right 2nd toe amputation - partial;  Surgeon: Park Liter, DPM;  Location: [facility];  Service: Podiatry;  Laterality: Right;   CATARACT EXTRACTION     CHOLECYSTECTOMY     EYE SURGERY     HERNIA REPAIR  SPINE SURGERY     TONSILLECTOMY     FAMILY HISTORY History reviewed. No pertinent family history.  SOCIAL HISTORY Social History   Tobacco Use   Smoking status: Former   Smokeless tobacco: Never  Advertising account planner   Vaping status: Never Used  Substance Use Topics   Alcohol use: No    Alcohol/week: 0.0 standard drinks of alcohol   Drug use: No       OPHTHALMIC EXAM:  Base Eye Exam     Visual Acuity (Snellen - Linear)       Right Left   Dist cc 20/40 -2 20/50 -2   Dist ph cc NI 20/40 -2    Correction: Glasses         Tonometry (Tonopen, 1:08 PM)       Right Left   Pressure 17 16         Pupils       Dark Light Shape React APD   Right 3 2 Round Brisk None   Left 3 2 Round Brisk None         Visual Fields (Counting fingers)       Left Right    Full Full         Extraocular Movement       Right Left    Full, Ortho Full, Ortho         Neuro/Psych     Oriented x3: Yes   Mood/Affect: Normal         Dilation     Both eyes: 1.0% Mydriacyl, 2.5% Phenylephrine @ 1:08 PM           Slit Lamp and Fundus Exam     Slit Lamp Exam       Right Left   Lids/Lashes Dermatochalasis - upper lid, Telangiectasia, mild MGD  Dermatochalasis - upper lid, Telangiectasia, Meibomian gland dysfunction   Conjunctiva/Sclera White and quiet White and quiet   Cornea Temporal Well healed cataract wounds, mild Arcus, trace endo pigment nasally, 1+PEE Arcus, Temporal Well healed cataract wounds, 1-2+ PEE, tear film debris   Anterior Chamber Deep and quiet Deep and quiet   Iris Round and moderately dilated to 4.77mm Round and moderately dilated to 5mm, Iris atrophy from 0100 to 0230, peripheral iridotomy at 0200   Lens 3-piece PC IOL in good position PC IOL in good position   Anterior Vitreous Vitreous syneresis, Posterior vitreous detachment, Weiss ring, vitreous condensations Vitreous syneresis, PVD, vitreous condensations         Fundus Exam       Right Left   Disc 360 Peripapillary atrophy, diffuse mild pallor, compact, sharp rim mild pallor, Peripapillary atrophy and pigmentation   C/D Ratio 0.1 0.2   Macula Blunted foveal reflex, +PED / CNV, RPE mottling, clumping and early atrophy, Drusen, No heme Blunted foveal reflex, low lying PED / +CNVM with stable improvement in IRF and SRF, RPE mottling and clumping, Drusen, No heme, early atrophy   Vessels Attenuated, mild tortuosity Attenuated, mild tortuosity   Periphery Attached, 360 Reticular degeneration, No heme Attached, 360 Reticular degeneration, No heme           Refraction     Wearing Rx       Sphere Cylinder Axis Add   Right -2.00 +1.25 161 +2.50   Left +0.25 +0.50 004 +2.50           IMAGING AND PROCEDURES  Imaging and Procedures for @TODAY @  OCT, Retina - OU - Both Eyes  Right Eye Quality was good. Central Foveal Thickness: 208. Progression has been stable. Findings include no IRF, no SRF, abnormal foveal contour, retinal drusen , subretinal hyper-reflective material, intraretinal hyper-reflective material, pigment epithelial detachment, outer retinal atrophy (Stable improvement in focal SRHM and IRHM, shallow persistent central PED).    Left Eye Quality was good. Central Foveal Thickness: 232. Progression has been stable. Findings include normal foveal contour, no IRF, no SRF, retinal drusen , epiretinal membrane, pigment epithelial detachment, outer retinal atrophy (Stable improvement in shallow SRF and low lying PED; patchy central ORA, partial PVD).   Notes *Images captured and stored on drive  Diagnosis / Impression:  OD: Stable improvement in focal SRHM and IRHM, shallow persistent central PED OS: Exudative ARMD -- stable improvement in shallow SRF and low lying PED; patchy central ORA, partial PVD  Clinical management:  See below  Abbreviations: NFP - Normal foveal profile. CME - cystoid macular edema. PED - pigment epithelial detachment. IRF - intraretinal fluid. SRF - subretinal fluid. EZ - ellipsoid zone. ERM - epiretinal membrane. ORA - outer retinal atrophy. ORT - outer retinal tubulation. SRHM - subretinal hyper-reflective material       Intravitreal Injection, Pharmacologic Agent - OD - Right Eye       Time Out 01/04/2023. 1:52 PM. Confirmed correct patient, procedure, site, and patient consented.   Anesthesia Topical anesthesia was used. Anesthetic medications included Lidocaine 2%, Proparacaine 0.5%.   Procedure Preparation included 5% betadine to ocular surface, eyelid speculum. A (32g) needle was used.   Injection: 2 mg aflibercept 2 MG/0.05ML   Route: Intravitreal, Site: Right Eye   NDC: L6038910, Lot: [id number], Expiration date: 05/23/2024, Waste: 0 mL   Post-op Post injection exam found visual acuity of at least counting fingers. The patient tolerated the procedure well. There were no complications. The patient received written and verbal post procedure care education.      Intravitreal Injection, Pharmacologic Agent - OS - Left Eye       Time Out 01/04/2023. 1:53 PM. Confirmed correct patient, procedure, site, and patient consented.   Anesthesia Topical anesthesia was  used. Anesthetic medications included Lidocaine 2%, Proparacaine 0.5%.   Procedure Preparation included 5% betadine to ocular surface, eyelid speculum. A (32g) needle was used.   Injection: 2 mg aflibercept 2 MG/0.05ML   Route: Intravitreal, Site: Left Eye   NDC: L6038910, Lot: [id number], Expiration date: 06/25/2023, Waste: 0 mL   Post-op Post injection exam found visual acuity of at least counting fingers. The patient tolerated the procedure well. There were no complications. The patient received written and verbal post procedure care education. Post injection medications were not given.             ASSESSMENT/PLAN:    ICD-10-CM   1. Exudative age-related macular degeneration of left eye with active choroidal neovascularization (HCC)  H35.3221 OCT, Retina - OU - Both Eyes    Intravitreal Injection, Pharmacologic Agent - OS - Left Eye    aflibercept (EYLEA) SOLN 2 mg    2. Exudative age-related macular degeneration of right eye with active choroidal neovascularization (HCC)  H35.3211 Intravitreal Injection, Pharmacologic Agent - OD - Right Eye    aflibercept (EYLEA) SOLN 2 mg    3. Diabetes mellitus type 2 without retinopathy (HCC)  E11.9     4. Long term (current) use of oral hypoglycemic drugs  Z79.84     5. Essential hypertension  I10     6. Hypertensive retinopathy of both eyes  H35.033  7. Pseudophakia of both eyes  Z96.1     8. Dry eyes  H04.123       1. Exudative age-related macular degeneration, left eye - conversion of OS from nonexudative to exudative ARMD prior to 02.05.20 visit - s/p IVA OS #1 (02.05.20), #2 (03.04.20), #3 (04.30.20), #4 (06.02.20), #5 (07.14.20), #6 (9.1.20), #7 (10.27.20), #8 (12.23.20), #9 (02.03.21), #10 (03.17.21), #11 (04.28.21), #12 (06.09.21), #13 (08.04.21), #14 (09.29.21), #15 (11.22.2021), #16 (01.26.22), #17 (04.06.22), #18 (06.28.22), #19 (9.13.22), #20 (11.29.22), #21 (01.31.23), #22 (04.04.23), #23 (05.30.23), #24  (07.27.23), #25 (09.19.23), #26 (11.07.23), #27 (12.29.23), #28 (02.14.24), #29 (06.04.24), #30 (07.16.24), #31 (08.27.24) -- IVA resistance - s/p IVE OS #1 (10.01.24), #2 (11.08.24) **history of recurrent / worsening SRF at 9 wk interval, noted on 04.04.23 visit, at 8 week interval on 09.19.23 and 06.04.24** - OCT shows OS: stable improvement in shallow SRF and low lying PED; patchy central ORA, partial PVD at 5 weeks  - BCVA OS improved to 20/40 from 20/70  - recommend IVE OS #3 today, 12.10.24 w/ f/u extended to 6 wks  - pt wishes to proceed with injection - RBA of procedure discussed, questions answered  - see procedure notes  - Eylea informed consent obtained, signed and scanned 10.01.24 (OU)  - Eylea approved for 2024  - f/u in 6 wks for DFE, OCT, possible injection OS   2. Age related macular degeneration, non-exudative, right eye  - s/p IVA OD #1 (06.04.24), #2 (07.16.24), #3 (08.27.24) -- IVA resistance  - s/p IVE OD #1 (10.01.24), #2 (11.08.24)  - conversion to exu ARMD on 06.04.24 exam - OCT shows OD: Stable improvement in focal SRHM and IRHM, shallow persistent central PED  - recommend IVE OD #3 today, 12.10.24 with follow up extended to 6 weeks  - pt wishes to proceed with injection  - RBA of procedure discussed, questions answered - IVE informed consent obtained and signed, 10.01.24 (OU) - see procedure note  - f/u in 6 wks, DFE, OCT, possible injxn OD  3,4. Diabetes mellitus, type 2 without retinopathy - The incidence, risk factors for progression, natural history and treatment options for diabetic retinopathy  were discussed with patient.   - The need for close monitoring of blood glucose, blood pressure, and serum lipids, avoiding cigarette or any type of tobacco, and the need for long term follow up was also discussed with patient.  - f/u in 1 year  5,6. Hypertensive retinopathy OU  - discussed importance of tight BP control  - continue to monitor  7. Pseudophakia  OU  - s/p CE/IOL OU  - beautiful surgeries, doing well  - continue to monitor  8. Dry eyes OU  - pt on restasis BID OU - recommend artificial tears and lubricating ointment as needed   Ophthalmic Meds Ordered this visit:  Meds ordered this encounter  Medications   aflibercept (EYLEA) SOLN 2 mg   aflibercept (EYLEA) SOLN 2 mg    Return in about 6 weeks (around 02/15/2023) for f/u exu ARMD OU, DFE, OCT.  There are no Patient Instructions on file for this visit.   This document serves as a record of services personally performed by Karie Chimera, MD, PhD. It was created on their behalf by Charlette Caffey, COT an ophthalmic technician. The creation of this record is the provider's dictation and/or activities during the visit.    Electronically signed by:  Charlette Caffey, COT  01/04/23 7:38 PM  This document serves as a  record of services personally performed by Karie Chimera, MD, PhD. It was created on their behalf by Glee Arvin. Manson Passey, OA an ophthalmic technician. The creation of this record is the provider's dictation and/or activities during the visit.    Electronically signed by: Glee Arvin. Manson Passey, OA 01/04/23 7:38 PM  Karie Chimera, M.D., Ph.D. Diseases & Surgery of the Retina and Vitreous [facility]  I have reviewed the above documentation for accuracy and completeness, and I agree with the above. Karie Chimera, M.D., Ph.D. 01/04/23 7:39 PM   Abbreviations: M myopia (nearsighted); A astigmatism; H hyperopia (farsighted); P presbyopia; Mrx spectacle prescription;  CTL contact lenses; OD right eye; OS left eye; OU both eyes  XT exotropia; ET esotropia; PEK punctate epithelial keratitis; PEE punctate epithelial erosions; DES dry eye syndrome; MGD meibomian gland dysfunction; ATs artificial tears; PFAT's preservative free artificial tears; NSC nuclear sclerotic cataract; PSC posterior subcapsular cataract; ERM epi-retinal membrane; PVD posterior  vitreous detachment; RD retinal detachment; DM diabetes mellitus; DR diabetic retinopathy; NPDR non-proliferative diabetic retinopathy; PDR proliferative diabetic retinopathy; CSME clinically significant macular edema; DME diabetic macular edema; dbh dot blot hemorrhages; CWS cotton wool spot; POAG primary open angle glaucoma; C/D cup-to-disc ratio; HVF humphrey visual field; GVF goldmann visual field; OCT optical coherence tomography; IOP intraocular pressure; BRVO Branch retinal vein occlusion; CRVO central retinal vein occlusion; CRAO central retinal artery occlusion; BRAO branch retinal artery occlusion; RT retinal tear; SB scleral buckle; PPV pars plana vitrectomy; VH Vitreous hemorrhage; PRP panretinal laser photocoagulation; IVK intravitreal kenalog; VMT vitreomacular traction; MH Macular hole;  NVD neovascularization of the disc; NVE neovascularization elsewhere; AREDS age related eye disease study; ARMD age related macular degeneration; POAG primary open angle glaucoma; EBMD epithelial/anterior basement membrane dystrophy; ACIOL anterior chamber intraocular lens; IOL intraocular lens; PCIOL posterior chamber intraocular lens; Phaco/IOL phacoemulsification with intraocular lens placement; PRK photorefractive keratectomy; LASIK laser assisted in situ keratomileusis; HTN hypertension; DM diabetes mellitus; COPD chronic obstructive pulmonary disease

## 2023-01-04 ENCOUNTER — Encounter (INDEPENDENT_AMBULATORY_CARE_PROVIDER_SITE_OTHER): Payer: Self-pay | Admitting: Ophthalmology

## 2023-01-04 ENCOUNTER — Ambulatory Visit (INDEPENDENT_AMBULATORY_CARE_PROVIDER_SITE_OTHER): Payer: Medicare Other | Admitting: Ophthalmology

## 2023-01-04 DIAGNOSIS — H04123 Dry eye syndrome of bilateral lacrimal glands: Secondary | ICD-10-CM | POA: Diagnosis not present

## 2023-01-04 DIAGNOSIS — I10 Essential (primary) hypertension: Secondary | ICD-10-CM | POA: Diagnosis not present

## 2023-01-04 DIAGNOSIS — H35033 Hypertensive retinopathy, bilateral: Secondary | ICD-10-CM

## 2023-01-04 DIAGNOSIS — E119 Type 2 diabetes mellitus without complications: Secondary | ICD-10-CM | POA: Diagnosis not present

## 2023-01-04 DIAGNOSIS — Z7984 Long term (current) use of oral hypoglycemic drugs: Secondary | ICD-10-CM

## 2023-01-04 DIAGNOSIS — Z961 Presence of intraocular lens: Secondary | ICD-10-CM | POA: Diagnosis not present

## 2023-01-04 DIAGNOSIS — H353221 Exudative age-related macular degeneration, left eye, with active choroidal neovascularization: Secondary | ICD-10-CM

## 2023-01-04 DIAGNOSIS — H353211 Exudative age-related macular degeneration, right eye, with active choroidal neovascularization: Secondary | ICD-10-CM

## 2023-01-04 DIAGNOSIS — H353231 Exudative age-related macular degeneration, bilateral, with active choroidal neovascularization: Secondary | ICD-10-CM | POA: Diagnosis not present

## 2023-01-04 MED ORDER — AFLIBERCEPT 2MG/0.05ML IZ SOLN FOR KALEIDOSCOPE
2.0000 mg | INTRAVITREAL | Status: AC | PRN
Start: 2023-01-04 — End: 2023-01-04
  Administered 2023-01-04: 2 mg via INTRAVITREAL

## 2023-02-01 NOTE — Progress Notes (Signed)
[facility] - Clinic Note  02/15/2023   CHIEF COMPLAINT Patient presents for Retina Follow Up  HISTORY OF PRESENT ILLNESS: Sue [name] is a 89 y.o. female who presents to the clinic today for:  HPI     Retina Follow Up   Patient presents with  Wet AMD.  In both eyes.  This started 6 weeks ago.  Duration of 6 weeks.  Since onset it is stable.  I, the attending physician,  performed the HPI with the patient and updated documentation appropriately.        Comments   6 week retina follow AMD OU and I'VE OS pt reporting no vision changes noticed she has floaters denies any flashes       Last edited by Rennis Chris, MD on 02/15/2023 12:49 PM.    Pt doesn't feel like her vision has change, but she had a hard time reading the eye chart this morning   Referring physician: [name], MD [address] [place],  [place] [address]  HISTORICAL INFORMATION:   Selected notes from the MEDICAL RECORD NUMBER Referred by Dr. Baker Pierini for concern of ARMD OU   CURRENT MEDICATIONS: Current Outpatient Medications (Ophthalmic Drugs)  Medication Sig   Polyethyl Glycol-Propyl Glycol (SYSTANE) 0.4-0.3 % SOLN Place 1 drop into both eyes 3 (three) times daily as needed (dry eyes). (Patient not taking: Reported on 01/04/2023)   RESTASIS 0.05 % ophthalmic emulsion Place 1 drop into both eyes 2 (two) times daily.   No current facility-administered medications for this visit. (Ophthalmic Drugs)   Current Outpatient Medications (Other)  Medication Sig   amLODipine (NORVASC) 10 MG tablet Take 1 tablet by mouth daily.   B-D ULTRAFINE III SHORT PEN 31G X 8 MM MISC USE AS DIRECTED ONCE DAILY SUBCUTANEOUSLY   Calcium Citrate-Vitamin D (CALCIUM + D PO)    calcium-vitamin D (OSCAL WITH D) 500-200 MG-UNIT tablet Take 1 tablet by mouth 2 (two) times daily.    Continuous Blood Gluc Sensor (FREESTYLE LIBRE 2 SENSOR) MISC See admin instructions.   Cyanocobalamin  (B-12 PO)    diphenhydramine-acetaminophen (TYLENOL PM) 25-500 MG TABS tablet Take 1 tablet by mouth at bedtime as needed (sleep).   Glucagon (GVOKE HYPOPEN 2-PACK) 0.5 MG/0.1ML SOAJ as needed for hypoglycemia   glucose blood test strip OneTouch Ultra Test strips  TEST ONCE A DAY ONCE A DAY FINGERSTICK 90   hydrALAZINE (APRESOLINE) 25 MG tablet Take 3 tablets (75 mg total) by mouth every 8 (eight) hours.   hydrochlorothiazide (HYDRODIURIL) 25 MG tablet Take 25 mg by mouth daily.   ibuprofen (ADVIL) 200 MG tablet Take 400 mg by mouth every 6 (six) hours as needed for moderate pain.   Insulin Degludec (TRESIBA FLEXTOUCH) 200 UNIT/ML SOPN Inject 36 Units into the skin at bedtime. (Patient taking differently: Inject 22 Units into the skin at bedtime.)   Insulin Pen Needle (PEN NEEDLES) 32G X 4 MM MISC BD Ultra-Fine Nano Pen Needle 32 gauge x 5/32"  USE AS DIRECTED DAILY   ketoconazole (NIZORAL) 2 % cream Apply topically.   Lancets (ONETOUCH ULTRASOFT) lancets OneTouch UltraSoft Lancets  USE TWICE DAILY   Melatonin 10 MG CAPS Take 10 mg by mouth at bedtime as needed (sleep).   meloxicam (MOBIC) 7.5 MG tablet Take 1 tablet (7.5 mg total) by mouth daily as needed for pain.   Multiple Vitamins-Minerals (ADULT ONE DAILY GUMMIES) CHEW Chew 1 tablet by mouth 2 (two) times daily. Centrum  Multiple Vitamins-Minerals (AIRBORNE) TBEF Take 1 tablet by mouth daily as needed (immune support).   Multiple Vitamins-Minerals (CENTRUM SILVER PO)    Multiple Vitamins-Minerals (PRESERVISION AREDS 2) CAPS Take 1 capsule by mouth 2 (two) times daily.   mupirocin ointment (BACTROBAN) 2 % Apply to right foot ulcer once daily. (Patient taking differently: Apply 1 application  topically daily as needed (wound care). Apply to right foot ulcer once daily.)   nortriptyline (PAMELOR) 25 MG capsule Take 1 capsule (25 mg total) by mouth at bedtime. (Patient taking differently: Take 25 mg by mouth 2 (two) times daily.)   Omega-3  Fatty Acids (FISH OIL) 1200 MG CAPS Take 1,200 mg by mouth daily with lunch.    omeprazole (PRILOSEC) 20 MG capsule Take 1 capsule by mouth daily.   ondansetron (ZOFRAN) 4 MG tablet Take 4 mg by mouth 3 (three) times daily as needed.   ONE TOUCH ULTRA TEST test strip    PRESCRIPTION MEDICATION by Intravitreal route every 30 (thirty) days. Ophthalmic injection at MD office in left eye   silver sulfADIAZINE (SILVADENE) 1 % cream Apply pea-sized amount to wound daily. (Patient taking differently: Apply 1 application  topically daily. Apply pea-sized amount to wound daily.)   simvastatin (ZOCOR) 20 MG tablet Take 1 tablet by mouth daily.   traMADol (ULTRAM) 50 MG tablet Take 1 tablet (50 mg total) by mouth 2 (two) times daily as needed.   vitamin B-12 (CYANOCOBALAMIN) 1000 MCG tablet Take 1,000 mcg by mouth 2 (two) times daily.    Current Facility-Administered Medications (Other)  Medication Route   Bevacizumab (AVASTIN) SOLN 1.25 mg Intravitreal   Bevacizumab (AVASTIN) SOLN 1.25 mg Intravitreal   REVIEW OF SYSTEMS: ROS   Positive for: Neurological, Skin, Genitourinary, Musculoskeletal, Endocrine, Eyes Negative for: Constitutional, Gastrointestinal, HENT, Cardiovascular, Respiratory, Psychiatric, Allergic/Imm, Heme/Lymph Last edited by Etheleen Mayhew, COT on 02/15/2023  9:59 AM.      ALLERGIES No Known Allergies  PAST MEDICAL HISTORY Past Medical History:  Diagnosis Date   Anemia    Arthritis    Basal cell carcinoma 11/23/2017   left elbow, right neck TX CX3 5FU    Basal cell carcinoma 04/04/2019   infil-right sideburn (CX35FU)   Chronic kidney disease    stage 3 ckd stage 3 b per dr Nicholos Johns 09-26-2019 note on chart   Complication of anesthesia    deifficulty waking up   COVID-19    Diabetic neuropathy (HCC)    Hyperlipidemia    Hypertension    Hypertensive retinopathy    OU   Infiltrative basal cell carcinoma (BCC) 04/22/2020   Right Zygomatic Area    Macular  degeneration    OU   Nodular basal cell carcinoma (BCC) 04/22/2020   Right Anterior Neck   Pneumonia    Right carotid bruit per dr Nicholos Johns 09-26-2019 note on chart   SCC (squamous cell carcinoma) 11/23/2017   right forehead TX CX3 5FU   SCCA (squamous cell carcinoma) of skin 04/22/2020   Mid Parietal Scalp (in situ)   SCCA (squamous cell carcinoma) of skin 04/22/2020   Right Dorsal Hand (in situ)   Type 2 DM with CKD stage 3 and hypertension (HCC)    Past Surgical History:  Procedure Laterality Date   ABDOMINAL HYSTERECTOMY     AMPUTATION TOE Right 09/28/2019   Procedure: Right 2nd toe amputation - partial;  Surgeon: Park Liter, DPM;  Location: [facility];  Service: Podiatry;  Laterality: Right;   CATARACT EXTRACTION  CHOLECYSTECTOMY     EYE SURGERY     HERNIA REPAIR     SPINE SURGERY     TONSILLECTOMY     FAMILY HISTORY History reviewed. No pertinent family history.  SOCIAL HISTORY Social History   Tobacco Use   Smoking status: Former   Smokeless tobacco: Never  Advertising account planner   Vaping status: Never Used  Substance Use Topics   Alcohol use: No    Alcohol/week: 0.0 standard drinks of alcohol   Drug use: No       OPHTHALMIC EXAM:  Base Eye Exam     Visual Acuity (Snellen - Linear)       Right Left   Dist cc 20/50 20/50 -2   Dist ph cc NI NI         Tonometry (Tonopen, 10:04 AM)       Right Left   Pressure 17 19         Pupils       Pupils Dark Light Shape React APD   Right PERRL 3 2 Round Brisk None   Left PERRL 3 2 Round Brisk None         Visual Fields       Left Right    Full Full         Extraocular Movement       Right Left    Full, Ortho Full, Ortho         Neuro/Psych     Oriented x3: Yes   Mood/Affect: Normal         Dilation     Both eyes: 2.5% Phenylephrine @ 10:04 AM           Slit Lamp and Fundus Exam     Slit Lamp Exam       Right Left   Lids/Lashes Dermatochalasis - upper  lid, Telangiectasia, mild MGD Dermatochalasis - upper lid, Telangiectasia, Meibomian gland dysfunction   Conjunctiva/Sclera White and quiet White and quiet   Cornea Temporal Well healed cataract wounds, mild Arcus, trace endo pigment nasally, 1+PEE Arcus, Temporal Well healed cataract wounds, 1-2+ PEE, tear film debris   Anterior Chamber Deep and quiet Deep and quiet   Iris Round and moderately dilated to 4.89mm Round and moderately dilated to 5mm, Iris atrophy from 0100 to 0230, peripheral iridotomy at 0200   Lens 3-piece PC IOL in good position PC IOL in good position   Anterior Vitreous Vitreous syneresis, Posterior vitreous detachment, Weiss ring, vitreous condensations Vitreous syneresis, PVD, vitreous condensations         Fundus Exam       Right Left   Disc 360 Peripapillary atrophy, diffuse mild pallor, compact, sharp rim mild pallor, Peripapillary atrophy and pigmentation   C/D Ratio 0.1 0.2   Macula Blunted foveal reflex, +PED / CNV, RPE mottling, clumping and early atrophy, Drusen, No heme Blunted foveal reflex, low lying PED / +CNVM with stable improvement in IRF and SRF, RPE mottling and clumping, Drusen, No heme, early atrophy   Vessels Attenuated, mild tortuosity Attenuated, mild tortuosity   Periphery Attached, 360 Reticular degeneration, No heme Attached, 360 Reticular degeneration, No heme           Refraction     Wearing Rx       Sphere Cylinder Axis Add   Right -2.00 +1.25 161 +2.50   Left +0.25 +0.50 004 +2.50           IMAGING AND PROCEDURES  Imaging and Procedures for @  TODAY@  OCT, Retina - OU - Both Eyes       Right Eye Quality was good. Central Foveal Thickness: 203. Progression has been stable. Findings include no IRF, no SRF, abnormal foveal contour, retinal drusen , subretinal hyper-reflective material, intraretinal hyper-reflective material, pigment epithelial detachment, outer retinal atrophy (Stable improvement in focal SRHM and IRHM, shallow  persistent central PED).   Left Eye Quality was good. Central Foveal Thickness: 232. Progression has been stable. Findings include normal foveal contour, no IRF, no SRF, retinal drusen , epiretinal membrane, pigment epithelial detachment, outer retinal atrophy (Stable improvement in shallow SRF and low lying PED; patchy central ORA, partial PVD).   Notes *Images captured and stored on drive  Diagnosis / Impression:  OD: Stable improvement in focal SRHM and IRHM, shallow persistent central PED OS: Exudative ARMD -- stable improvement in shallow SRF and low lying PED; patchy central ORA, partial PVD  Clinical management:  See below  Abbreviations: NFP - Normal foveal profile. CME - cystoid macular edema. PED - pigment epithelial detachment. IRF - intraretinal fluid. SRF - subretinal fluid. EZ - ellipsoid zone. ERM - epiretinal membrane. ORA - outer retinal atrophy. ORT - outer retinal tubulation. SRHM - subretinal hyper-reflective material       Intravitreal Injection, Pharmacologic Agent - OD - Right Eye       Time Out 02/15/2023. 11:42 AM. Confirmed correct patient, procedure, site, and patient consented.   Anesthesia Topical anesthesia was used. Anesthetic medications included Lidocaine 2%, Proparacaine 0.5%.   Procedure Preparation included 5% betadine to ocular surface, eyelid speculum. A (32g) needle was used.   Injection: 1.25 mg Bevacizumab 1.25mg /0.54ml   Route: Intravitreal, Site: Right Eye   NDC: P3213405, Lot: [id number], Expiration date: 04/14/2023   Post-op Post injection exam found visual acuity of at least counting fingers. The patient tolerated the procedure well. There were no complications. The patient received written and verbal post procedure care education.      Intravitreal Injection, Pharmacologic Agent - OS - Left Eye       Time Out 02/15/2023. 11:42 AM. Confirmed correct patient, procedure, site, and patient consented.   Anesthesia Topical  anesthesia was used. Anesthetic medications included Lidocaine 2%, Proparacaine 0.5%.   Procedure Preparation included 5% betadine to ocular surface, eyelid speculum. A (32g) needle was used.   Injection: 1.25 mg Bevacizumab 1.25mg /0.60ml   Route: Intravitreal, Site: Left Eye   NDC: P3213405, Lot: [id number], Expiration date: 05/18/2023   Post-op Post injection exam found visual acuity of at least counting fingers. The patient tolerated the procedure well. There were no complications. The patient received written and verbal post procedure care education. Post injection medications were not given.            ASSESSMENT/PLAN:    ICD-10-CM   1. Exudative age-related macular degeneration of left eye with active choroidal neovascularization (HCC)  H35.3221 OCT, Retina - OU - Both Eyes    Intravitreal Injection, Pharmacologic Agent - OS - Left Eye    Bevacizumab (AVASTIN) SOLN 1.25 mg    2. Exudative age-related macular degeneration of right eye with active choroidal neovascularization (HCC)  H35.3211 Intravitreal Injection, Pharmacologic Agent - OD - Right Eye    Bevacizumab (AVASTIN) SOLN 1.25 mg    3. Diabetes mellitus type 2 without retinopathy (HCC)  E11.9     4. Long term (current) use of oral hypoglycemic drugs  Z79.84     5. Essential hypertension  I10     6. Hypertensive retinopathy  of both eyes  H35.033     7. Pseudophakia of both eyes  Z96.1     8. Dry eyes  H04.123      1. Exudative age-related macular degeneration, left eye - conversion of OS from nonexudative to exudative ARMD prior to 02.05.20 visit - s/p IVA OS #1 (02.05.20), #2 (03.04.20), #3 (04.30.20), #4 (06.02.20), #5 (07.14.20), #6 (9.1.20), #7 (10.27.20), #8 (12.23.20), #9 (02.03.21), #10 (03.17.21), #11 (04.28.21), #12 (06.09.21), #13 (08.04.21), #14 (09.29.21), #15 (11.22.2021), #16 (01.26.22), #17 (04.06.22), #18 (06.28.22), #19 (9.13.22), #20 (11.29.22), #21 (01.31.23), #22 (04.04.23), #23 (05.30.23),  #24 (07.27.23), #25 (09.19.23), #26 (11.07.23), #27 (12.29.23), #28 (02.14.24), #29 (06.04.24), #30 (07.16.24), #31 (08.27.24) -- IVA resistance - s/p IVE OS #1 (10.01.24), #2 (11.08.24), #3 (12.10.24) **history of recurrent / worsening SRF at 9 wk interval, noted on 04.04.23 visit, at 8 week interval on 09.19.23 and 06.04.24** - OCT shows OS: stable improvement in shallow SRF and low lying PED; patchy central ORA, partial PVD at 6 weeks  - BCVA OS decreased to 20/50 from 20/40  - recommend switching back to IVA OS #32 today, 1.21.25 (due to no funding for Good Days) w/ f/u in 6 wks  - pt wishes to proceed with injection - RBA of procedure discussed, questions answered  - see procedure notes  - Avastin informed consent obtained, signed and scanned 01.21.25 (OU)  - Eylea approved for 2025 -- but no funding for Good Days  - f/u in 6 wks for DFE, OCT, possible injection OS   2. Exudative age-related macular degeneration, right eye  - conversion to exu ARMD on 06.04.24 exam  - s/p IVA OD #1 (06.04.24), #2 (07.16.24), #3 (08.27.24)   - s/p IVE OD #1 (10.01.24), #2 (11.08.24), #3 (12.10.24) - OCT shows OD: Stable improvement in focal SRHM and IRHM, shallow persistent central PED at 6 weeks  - recommend switching back to IVA OD #4 today, 1.21.25 (due to no funding with [employer]) with follow up in weeks  - pt wishes to proceed with injection  - RBA of procedure discussed, questions answered - IVA informed consent obtained and signed, 01.21.25 (OU) - see procedure note  - f/u in 6 wks, DFE, OCT, possible injxn OD  3,4. Diabetes mellitus, type 2 without retinopathy - The incidence, risk factors for progression, natural history and treatment options for diabetic retinopathy  were discussed with patient.   - The need for close monitoring of blood glucose, blood pressure, and serum lipids, avoiding cigarette or any type of tobacco, and the need for long term follow up was also discussed with  patient.  - f/u in 1 year  5,6. Hypertensive retinopathy OU  - discussed importance of tight BP control  - continue to monitor  7. Pseudophakia OU  - s/p CE/IOL OU  - beautiful surgeries, doing well  - continue to monitor  8. Dry eyes OU  - pt on restasis BID OU - recommend artificial tears and lubricating ointment as needed   Ophthalmic Meds Ordered this visit:  Meds ordered this encounter  Medications   Bevacizumab (AVASTIN) SOLN 1.25 mg   Bevacizumab (AVASTIN) SOLN 1.25 mg    Return in about 6 weeks (around 03/29/2023) for f/u exu ARMD OU, DFE, OCT.  There are no Patient Instructions on file for this visit.   This document serves as a record of services personally performed by Karie Chimera, MD, PhD. It was created on their behalf by Charlette Caffey, COT an ophthalmic  technician. The creation of this record is the provider's dictation and/or activities during the visit.    Electronically signed by:  Charlette Caffey, COT  02/15/23 12:51 PM  This document serves as a record of services personally performed by Karie Chimera, MD, PhD. It was created on their behalf by Glee Arvin. Manson Passey, OA an ophthalmic technician. The creation of this record is the provider's dictation and/or activities during the visit.    Electronically signed by: Glee Arvin. Manson Passey, OA 02/15/23 12:51 PM   Karie Chimera, M.D., Ph.D. Diseases & Surgery of the Retina and Vitreous [facility]  I have reviewed the above documentation for accuracy and completeness, and I agree with the above. Karie Chimera, M.D., Ph.D. 02/15/23 12:53 PM   Abbreviations: M myopia (nearsighted); A astigmatism; H hyperopia (farsighted); P presbyopia; Mrx spectacle prescription;  CTL contact lenses; OD right eye; OS left eye; OU both eyes  XT exotropia; ET esotropia; PEK punctate epithelial keratitis; PEE punctate epithelial erosions; DES dry eye syndrome; MGD meibomian gland dysfunction; ATs  artificial tears; PFAT's preservative free artificial tears; NSC nuclear sclerotic cataract; PSC posterior subcapsular cataract; ERM epi-retinal membrane; PVD posterior vitreous detachment; RD retinal detachment; DM diabetes mellitus; DR diabetic retinopathy; NPDR non-proliferative diabetic retinopathy; PDR proliferative diabetic retinopathy; CSME clinically significant macular edema; DME diabetic macular edema; dbh dot blot hemorrhages; CWS cotton wool spot; POAG primary open angle glaucoma; C/D cup-to-disc ratio; HVF humphrey visual field; GVF goldmann visual field; OCT optical coherence tomography; IOP intraocular pressure; BRVO Branch retinal vein occlusion; CRVO central retinal vein occlusion; CRAO central retinal artery occlusion; BRAO branch retinal artery occlusion; RT retinal tear; SB scleral buckle; PPV pars plana vitrectomy; VH Vitreous hemorrhage; PRP panretinal laser photocoagulation; IVK intravitreal kenalog; VMT vitreomacular traction; MH Macular hole;  NVD neovascularization of the disc; NVE neovascularization elsewhere; AREDS age related eye disease study; ARMD age related macular degeneration; POAG primary open angle glaucoma; EBMD epithelial/anterior basement membrane dystrophy; ACIOL anterior chamber intraocular lens; IOL intraocular lens; PCIOL posterior chamber intraocular lens; Phaco/IOL phacoemulsification with intraocular lens placement; PRK photorefractive keratectomy; LASIK laser assisted in situ keratomileusis; HTN hypertension; DM diabetes mellitus; COPD chronic obstructive pulmonary disease

## 2023-02-15 ENCOUNTER — Ambulatory Visit: Payer: Medicare Other | Admitting: Podiatry

## 2023-02-15 ENCOUNTER — Encounter: Payer: Self-pay | Admitting: Podiatry

## 2023-02-15 ENCOUNTER — Encounter (INDEPENDENT_AMBULATORY_CARE_PROVIDER_SITE_OTHER): Payer: Self-pay | Admitting: Ophthalmology

## 2023-02-15 ENCOUNTER — Ambulatory Visit (INDEPENDENT_AMBULATORY_CARE_PROVIDER_SITE_OTHER): Payer: Medicare Other | Admitting: Ophthalmology

## 2023-02-15 VITALS — Ht 66.0 in

## 2023-02-15 DIAGNOSIS — E119 Type 2 diabetes mellitus without complications: Secondary | ICD-10-CM

## 2023-02-15 DIAGNOSIS — E1142 Type 2 diabetes mellitus with diabetic polyneuropathy: Secondary | ICD-10-CM

## 2023-02-15 DIAGNOSIS — Z7984 Long term (current) use of oral hypoglycemic drugs: Secondary | ICD-10-CM

## 2023-02-15 DIAGNOSIS — B351 Tinea unguium: Secondary | ICD-10-CM | POA: Diagnosis not present

## 2023-02-15 DIAGNOSIS — Z89421 Acquired absence of other right toe(s): Secondary | ICD-10-CM

## 2023-02-15 DIAGNOSIS — I10 Essential (primary) hypertension: Secondary | ICD-10-CM | POA: Diagnosis not present

## 2023-02-15 DIAGNOSIS — Z961 Presence of intraocular lens: Secondary | ICD-10-CM

## 2023-02-15 DIAGNOSIS — H35033 Hypertensive retinopathy, bilateral: Secondary | ICD-10-CM | POA: Diagnosis not present

## 2023-02-15 DIAGNOSIS — M79674 Pain in right toe(s): Secondary | ICD-10-CM | POA: Diagnosis not present

## 2023-02-15 DIAGNOSIS — H353221 Exudative age-related macular degeneration, left eye, with active choroidal neovascularization: Secondary | ICD-10-CM

## 2023-02-15 DIAGNOSIS — M2042 Other hammer toe(s) (acquired), left foot: Secondary | ICD-10-CM

## 2023-02-15 DIAGNOSIS — H04123 Dry eye syndrome of bilateral lacrimal glands: Secondary | ICD-10-CM

## 2023-02-15 DIAGNOSIS — E11621 Type 2 diabetes mellitus with foot ulcer: Secondary | ICD-10-CM

## 2023-02-15 DIAGNOSIS — L97411 Non-pressure chronic ulcer of right heel and midfoot limited to breakdown of skin: Secondary | ICD-10-CM | POA: Diagnosis not present

## 2023-02-15 DIAGNOSIS — H353231 Exudative age-related macular degeneration, bilateral, with active choroidal neovascularization: Secondary | ICD-10-CM | POA: Diagnosis not present

## 2023-02-15 DIAGNOSIS — M2041 Other hammer toe(s) (acquired), right foot: Secondary | ICD-10-CM

## 2023-02-15 DIAGNOSIS — M79675 Pain in left toe(s): Secondary | ICD-10-CM

## 2023-02-15 DIAGNOSIS — H353211 Exudative age-related macular degeneration, right eye, with active choroidal neovascularization: Secondary | ICD-10-CM

## 2023-02-15 MED ORDER — BEVACIZUMAB CHEMO INJECTION 1.25MG/0.05ML SYRINGE FOR KALEIDOSCOPE
1.2500 mg | INTRAVITREAL | Status: AC | PRN
  Administered 2023-02-15: 1.25 mg via INTRAVITREAL

## 2023-02-15 MED ORDER — GENTAMICIN SULFATE 0.1 % EX CREA: TOPICAL_CREAM | CUTANEOUS | 0 refills | Status: DC

## 2023-02-15 NOTE — Patient Instructions (Signed)
   DRESSING CHANGES right foot:   PHARMACY SHOPPING LIST: Saline or Wound Cleanser for cleaning wound 2 x 2 inch sterile gauze for cleaning wound Gentamicin Cream  A. IF DISPENSED, WEAR SURGICAL SHOE OR WALKING BOOT AT ALL TIMES.  B. IF PRESCRIBED ORAL ANTIBIOTICS, TAKE ALL MEDICATION AS PRESCRIBED UNTIL ALL ARE GONE.  C. IF DOCTOR HAS DESIGNATED NONWEIGHTBEARING STATUS, PLEASE ADHERE TO INSTRUCTIONS.  KEEP right foot DRY AT ALL TIMES!!!!  CLEANSE ULCER WITH SALINE OR WOUND CLEANSER.  DAB DRY WITH GAUZE SPONGE.  APPLY A LIGHT AMOUNT OF Gentamicin Cream TO BASE OF ULCER.  APPLY OUTER DRESSING AS INSTRUCTED.  WEAR SURGICAL SHOE/BOOT DAILY AT ALL TIMES. IF SUPPLIED, WEAR HEEL PROTECTORS AT ALL TIMES WHEN IN BED.  DO NOT WALK BAREFOOT!!!  IF YOU EXPERIENCE ANY FEVER, CHILLS, NIGHTSWEATS, NAUSEA OR VOMITING, ELEVATED OR LOW BLOOD SUGARS, REPORT TO EMERGENCY ROOM.  IF YOU EXPERIENCE INCREASED REDNESS, PAIN, SWELLING, DISCOLORATION, ODOR, PUS, DRAINAGE OR WARMTH OF YOUR FOOT, REPORT TO EMERGENCY ROOM.

## 2023-02-21 NOTE — Progress Notes (Signed)
ANNUAL DIABETIC FOOT EXAM  Subjective: Sue [name] presents today for annual diabetic foot exam.  Chief Complaint  Patient presents with   RFC    She is here for a nail trim, her PCP is dr. Nicholos Johns, and seen him 4 months ago.    Patient confirms h/o diabetes.  Patient has h/o foot/leg ulcer of right foot.  Patient has h/o amputation(s): digital amputation R 2nd toe.  Patient has been diagnosed with neuropathy.  Sue Fick, MD is patient's PCP.  Past Medical History:  Diagnosis Date   Anemia    Arthritis    Basal cell carcinoma 11/23/2017   left elbow, right neck TX CX3 5FU    Basal cell carcinoma 04/04/2019   infil-right sideburn (CX35FU)   Chronic kidney disease    stage 3 ckd stage 3 b per dr Nicholos Johns 09-26-2019 note on chart   Complication of anesthesia    deifficulty waking up   COVID-19    Diabetic neuropathy (HCC)    Hyperlipidemia    Hypertension    Hypertensive retinopathy    OU   Infiltrative basal cell carcinoma (BCC) 04/22/2020   Right Zygomatic Area    Macular degeneration    OU   Nodular basal cell carcinoma (BCC) 04/22/2020   Right Anterior Neck   Pneumonia    Right carotid bruit per dr Nicholos Johns 09-26-2019 note on chart   SCC (squamous cell carcinoma) 11/23/2017   right forehead TX CX3 5FU   SCCA (squamous cell carcinoma) of skin 04/22/2020   Mid Parietal Scalp (in situ)   SCCA (squamous cell carcinoma) of skin 04/22/2020   Right Dorsal Hand (in situ)   Type 2 DM with CKD stage 3 and hypertension (HCC)    Patient Active Problem List   Diagnosis Date Noted   Slow transit constipation 05/11/2021   Chronic kidney disease due to hypertension 09/03/2020   Menopause 09/03/2020   Polyneuropathy due to type 2 diabetes mellitus (HCC) 09/03/2020   Primary osteoarthritis 09/03/2020   Recurrent falls 09/03/2020   Closed fracture of distal end of left radius 03/13/2020   Carotid bruit 02/19/2020   Electrocardiogram abnormal  02/19/2020   Foot callus 02/19/2020   Hyperglycemia due to type 2 diabetes mellitus (HCC) 02/19/2020   Iron deficiency anemia secondary to inadequate dietary iron intake 02/19/2020   Adult general medical exam 02/19/2020   Pyogenic inflammation of bone (HCC)    Gastroenteritis due to COVID-19 virus 12/21/2018   Acute renal failure superimposed on stage 3b chronic kidney disease (HCC) 12/21/2018   Diabetes mellitus type 2, uncontrolled, with complications 12/20/2018   HLD (hyperlipidemia) 12/20/2018   Anxiety 12/20/2018   Depression 12/20/2018   Acute respiratory disease due to COVID-19 virus 12/17/2018   Dehydration 12/17/2018   Essential hypertension 12/17/2018   Type 1 diabetes mellitus without complication (HCC) 12/17/2018   Diabetic ulcer of foot associated with diabetes mellitus due to underlying condition, limited to breakdown of skin (HCC) 01/11/2018   Right hip pain 04/15/2016   Right buttock pain 04/15/2016   Primary osteoarthritis of right hip 04/15/2016   Past Surgical History:  Procedure Laterality Date   ABDOMINAL HYSTERECTOMY     AMPUTATION TOE Right 09/28/2019   Procedure: Right 2nd toe amputation - partial;  Surgeon: Park Liter, DPM;  Location: [facility] [place];  Service: Podiatry;  Laterality: Right;   CATARACT EXTRACTION     CHOLECYSTECTOMY     EYE SURGERY     HERNIA REPAIR  SPINE SURGERY     TONSILLECTOMY     Current Outpatient Medications on File Prior to Visit  Medication Sig Dispense Refill   amLODipine (NORVASC) 10 MG tablet Take 1 tablet by mouth daily.     B-D ULTRAFINE III SHORT PEN 31G X 8 MM MISC USE AS DIRECTED ONCE DAILY SUBCUTANEOUSLY  11   Calcium Citrate-Vitamin D (CALCIUM + D PO)      calcium-vitamin D (OSCAL WITH D) 500-200 MG-UNIT tablet Take 1 tablet by mouth 2 (two) times daily.      Continuous Blood Gluc Sensor (FREESTYLE LIBRE 2 SENSOR) MISC See admin instructions.     Cyanocobalamin (B-12 PO)       diphenhydramine-acetaminophen (TYLENOL PM) 25-500 MG TABS tablet Take 1 tablet by mouth at bedtime as needed (sleep).     Glucagon (GVOKE HYPOPEN 2-PACK) 0.5 MG/0.1ML SOAJ as needed for hypoglycemia     glucose blood test strip OneTouch Ultra Test strips  TEST ONCE A DAY ONCE A DAY FINGERSTICK 90     hydrALAZINE (APRESOLINE) 25 MG tablet Take 3 tablets (75 mg total) by mouth every 8 (eight) hours. 270 tablet 0   hydrochlorothiazide (HYDRODIURIL) 25 MG tablet Take 25 mg by mouth daily.     ibuprofen (ADVIL) 200 MG tablet Take 400 mg by mouth every 6 (six) hours as needed for moderate pain.     Insulin Degludec (TRESIBA FLEXTOUCH) 200 UNIT/ML SOPN Inject 36 Units into the skin at bedtime. (Patient taking differently: Inject 22 Units into the skin at bedtime.) 3 pen 0   Insulin Pen Needle (PEN NEEDLES) 32G X 4 MM MISC BD Ultra-Fine Nano Pen Needle 32 gauge x 5/32"  USE AS DIRECTED DAILY     ketoconazole (NIZORAL) 2 % cream Apply topically.     Lancets (ONETOUCH ULTRASOFT) lancets OneTouch UltraSoft Lancets  USE TWICE DAILY     Melatonin 10 MG CAPS Take 10 mg by mouth at bedtime as needed (sleep).     meloxicam (MOBIC) 7.5 MG tablet Take 1 tablet (7.5 mg total) by mouth daily as needed for pain. 30 tablet 3   Multiple Vitamins-Minerals (ADULT ONE DAILY GUMMIES) CHEW Chew 1 tablet by mouth 2 (two) times daily. Centrum     Multiple Vitamins-Minerals (AIRBORNE) TBEF Take 1 tablet by mouth daily as needed (immune support).     Multiple Vitamins-Minerals (CENTRUM SILVER PO)      Multiple Vitamins-Minerals (PRESERVISION AREDS 2) CAPS Take 1 capsule by mouth 2 (two) times daily.     mupirocin ointment (BACTROBAN) 2 % Apply to right foot ulcer once daily. (Patient taking differently: Apply 1 application  topically daily as needed (wound care). Apply to right foot ulcer once daily.) 30 g 1   nortriptyline (PAMELOR) 25 MG capsule Take 1 capsule (25 mg total) by mouth at bedtime. (Patient taking differently:  Take 25 mg by mouth 2 (two) times daily.) 30 capsule 0   Omega-3 Fatty Acids (FISH OIL) 1200 MG CAPS Take 1,200 mg by mouth daily with lunch.      omeprazole (PRILOSEC) 20 MG capsule Take 1 capsule by mouth daily.     ondansetron (ZOFRAN) 4 MG tablet Take 4 mg by mouth 3 (three) times daily as needed.     ONE TOUCH ULTRA TEST test strip      Polyethyl Glycol-Propyl Glycol (SYSTANE) 0.4-0.3 % SOLN Place 1 drop into both eyes 3 (three) times daily as needed (dry eyes).     PRESCRIPTION MEDICATION by Intravitreal route every  30 (thirty) days. Ophthalmic injection at MD office in left eye     RESTASIS 0.05 % ophthalmic emulsion Place 1 drop into both eyes 2 (two) times daily. 0.4 mL 6   silver sulfADIAZINE (SILVADENE) 1 % cream Apply pea-sized amount to wound daily. (Patient taking differently: Apply 1 application  topically daily. Apply pea-sized amount to wound daily.) 50 g 0   simvastatin (ZOCOR) 20 MG tablet Take 1 tablet by mouth daily.     traMADol (ULTRAM) 50 MG tablet Take 1 tablet (50 mg total) by mouth 2 (two) times daily as needed. 20 tablet 0   vitamin B-12 (CYANOCOBALAMIN) 1000 MCG tablet Take 1,000 mcg by mouth 2 (two) times daily.      Current Facility-Administered Medications on File Prior to Visit  Medication Dose Route Frequency Provider Last Rate Last Admin   Bevacizumab (AVASTIN) SOLN 1.25 mg  1.25 mg Intravitreal  Rennis Chris, MD   1.25 mg at 03/01/18 1344   Bevacizumab (AVASTIN) SOLN 1.25 mg  1.25 mg Intravitreal  Rennis Chris, MD   1.25 mg at 03/31/18 1449    No Known Allergies Social History   Occupational History   Not on file  Tobacco Use   Smoking status: Former   Smokeless tobacco: Never  Vaping Use   Vaping status: Never Used  Substance and Sexual Activity   Alcohol use: No    Alcohol/week: 0.0 standard drinks of alcohol   Drug use: No   Sexual activity: Not Currently   History reviewed. No pertinent family history. Immunization History  Administered  Date(s) Administered   Influenza, High Dose Seasonal PF 11/28/2018   PFIZER(Purple Top)SARS-COV-2 Vaccination 04/02/2019, 05/02/2019    Review of Systems: Negative except as noted in the HPI.   Objective: There were no vitals filed for this visit.  Sue [name] is a pleasant 89 y.o. female in NAD. AAO X 3.  Title   Diabetic Foot Exam - detailed Date & Time: 02/15/2023  1:30 PM Diabetic Foot exam was performed with the following findings: Yes  Visual Foot Exam completed.: Yes  Is there a history of foot ulcer?: Yes Is there a foot ulcer now?: Yes Is there swelling?: No Is there elevated skin temperature?: No Is there abnormal foot shape?: Yes Is there a claw toe deformity?: Yes Are the toenails long?: Yes Are the toenails thick?: Yes Are the toenails ingrown?: No Is the skin thin, fragile, shiny and hairless?": No Normal Range of Motion?: No Is there foot or ankle muscle weakness?: No Do you have pain in calf while walking?: No Are the shoes appropriate in style and fit?: No Can the patient see the bottom of their feet?: No Pulse Foot Exam completed.: Yes   Right Posterior Tibialis: Present Left posterior Tibialis: Present   Right Dorsalis Pedis: Present Left Dorsalis Pedis: Present     Sensory Foot Exam Completed.: Yes Semmes-Weinstein Monofilament Test "+" means "has sensation" and "-" means "no sensation"   R Site 1-Great Toe: Neg L Site 1-Great Toe: Neg   R Site 4: Neg L Site 4: Neg   R site 5: Neg L Site 5: Neg  R Site 6: Neg L Site 6: Neg     Image components are not supported.   Image components are not supported. Image components are not supported.  [place] Right vibratory: diminished Left vibratory: diminished  Comments Wound Location: submet head 2 right foot There is a minimal amount of devitalized tissue present in the wound.  Predebridement Wound Measurement:  0.4  x 0.2 x 0 cm with hemorrhagic hyperkeratotic roof. Postdebridement Wound  Measurement: 0.5 x 0.3 x 0.2 cm. Wound Base: Mixed Granular/Fibrotic Peri-wound: Normal Exudate: None: wound tissue dry Blood Loss during debridement: 0 cc('s). Sign(s) of clinical bacterial infection: no clinical signs of infection noted on examination today.       Lab Results  Component Value Date   HGBA1C 8.8 (H) 09/21/2019   ADA Risk Categorization: High Risk  Patient has one or more of the following: Loss of protective sensation Absent pedal pulses Severe Foot deformity History of foot ulcer  Assessment: 1. Pain due to onychomycosis of toenails of both feet   2. Diabetic ulcer of right midfoot associated with type 2 diabetes mellitus, limited to breakdown of skin (HCC)   3. History of partial amputation of toe of right foot (HCC)   4. Acquired hammertoes of both feet   5. Diabetic peripheral neuropathy associated with type 2 diabetes mellitus (HCC)   6. Encounter for diabetic foot exam (HCC)     Plan: Meds ordered this encounter  Medications   gentamicin cream (GARAMYCIN) 0.1 %    Sig: Apply to ulcer right foot once daily.    Dispense:  30 g    Refill:  0  Plan: -Patient/POA/Family member educated on diagnosis and treatment plan of routine ulcer debridement/wound care.  -Ulceration debridement achieved utilizing sharp excisional debridement with sterile scalpel blade and sterile currette.. Type/amount of devitalized tissue removed: necrotic tissue -Today's ulcer size post-debridement: 0.5 x 0.3 x 0.2 cm. -Ulceration cleansed with wound cleanser. triple antibiotic ointment applied to base of ulceration and secured with light dressing. -Wound responded well to today's debridement. -Patient risk factors affecting healing of ulcer: uncontrolled diabetes, diabetic neuropathy, foot deformity, history of foot/leg ulcer, current foot/leg ulcer right foot, history of amputation R 2nd toe -Elpidio Anis given written instructions on daily wound care for right foot  ulceration. -Prescription written for Gentamicin Cream. Patient is to apply to right foot once daily and cover with dressing.  -Dispensed Darco shoe for right foot.Patient/POA signed ABN for DME. -Toenails were debrided in length and girth 1-5 left foot, R hallux, R 3rd toe, R 4th toe, and R 5th toe with sterile nail nippers and dremel without iatrogenic bleeding.  -Patient scheduled to see Dr. Bronwen Betters in one week for follow up of diabetic ulcer right foot. -Patient/POA to call should there be question/concern in the interim.  Return in about 1 week (around 02/22/2023).  Freddie Breech, DPM      [place] LOCATION: [address] [place] [address]                   Office [phone]   [facility] LOCATION: [address] [place], [place] [address] Office [phone]

## 2023-02-24 ENCOUNTER — Encounter: Payer: Self-pay | Admitting: Podiatry

## 2023-02-24 ENCOUNTER — Ambulatory Visit (INDEPENDENT_AMBULATORY_CARE_PROVIDER_SITE_OTHER): Payer: Medicare Other

## 2023-02-24 ENCOUNTER — Ambulatory Visit: Payer: Medicare Other | Admitting: Podiatry

## 2023-02-24 DIAGNOSIS — E1142 Type 2 diabetes mellitus with diabetic polyneuropathy: Secondary | ICD-10-CM

## 2023-02-24 DIAGNOSIS — E11621 Type 2 diabetes mellitus with foot ulcer: Secondary | ICD-10-CM | POA: Diagnosis not present

## 2023-02-24 DIAGNOSIS — M778 Other enthesopathies, not elsewhere classified: Secondary | ICD-10-CM

## 2023-02-24 DIAGNOSIS — L97411 Non-pressure chronic ulcer of right heel and midfoot limited to breakdown of skin: Secondary | ICD-10-CM | POA: Diagnosis not present

## 2023-02-24 NOTE — Patient Instructions (Signed)
Look for urea 40% cream or ointment and apply to the thickened dry skin / calluses. This can be bought over the counter, at a pharmacy or online such as [employer].  You may look for a combination product that has salicylic acid.  Alternatively can also scrub the callus with white vinegar once the wound fully heals.  This can make it easier to file down gently with a pumice stone or emery board  More silicone or felt pads can be purchased from:  [email or web address]   Look for horseshoe felt pads.  These can also be bought from [employer].

## 2023-02-24 NOTE — Progress Notes (Addendum)
Chief Complaint  Patient presents with   Diabetic Ulcer    DB Ulcer on right ball of foot, Last A1c 7 ish, sees PCP next week. No anti coags.     HPI: 89 y.o. female presents today as referral from Dr. Donzetta Matters due to presence of right forefoot diabetic ulcer at site of callus.  She is accompanied by family member.  They have been compliant with wound care.  History of diabetes.  History of right second toe amputation at the DIPJ.  She does endorse neuropathy.  Past Medical History:  Diagnosis Date   Anemia    Arthritis    Basal cell carcinoma 11/23/2017   left elbow, right neck TX CX3 5FU    Basal cell carcinoma 04/04/2019   infil-right sideburn (CX35FU)   Chronic kidney disease    stage 3 ckd stage 3 b per dr Nicholos Johns 09-26-2019 note on chart   Complication of anesthesia    deifficulty waking up   COVID-19    Diabetic neuropathy (HCC)    Hyperlipidemia    Hypertension    Hypertensive retinopathy    OU   Infiltrative basal cell carcinoma (BCC) 04/22/2020   Right Zygomatic Area    Macular degeneration    OU   Nodular basal cell carcinoma (BCC) 04/22/2020   Right Anterior Neck   Pneumonia    Right carotid bruit per dr Nicholos Johns 09-26-2019 note on chart   SCC (squamous cell carcinoma) 11/23/2017   right forehead TX CX3 5FU   SCCA (squamous cell carcinoma) of skin 04/22/2020   Mid Parietal Scalp (in situ)   SCCA (squamous cell carcinoma) of skin 04/22/2020   Right Dorsal Hand (in situ)   Type 2 DM with CKD stage 3 and hypertension (HCC)     Past Surgical History:  Procedure Laterality Date   ABDOMINAL HYSTERECTOMY     AMPUTATION TOE Right 09/28/2019   Procedure: Right 2nd toe amputation - partial;  Surgeon: Park Liter, DPM;  Location: [facility];  Service: Podiatry;  Laterality: Right;   CATARACT EXTRACTION     CHOLECYSTECTOMY     EYE SURGERY     HERNIA REPAIR     SPINE SURGERY     TONSILLECTOMY      No Known Allergies  ROS     Physical Exam: There were no vitals filed for this visit.  General: The patient is alert and oriented x3 in no acute distress.  Dermatology: Pedal skin atrophic.  Hyperkeratotic tissue right foot 2/3 metatarsal head with dried heme present.  Upon debridement, nearly healed at this point with pinpoint bleeding at area of dried heme. 0.2 x 0.2 x 0.2 area.  Nail plates within normal limits for thickness and length following recent nail debridement. Fat pad atrophy to forefoot bilaterally.  Vascular: Palpable pedal pulses bilaterally. Capillary refill within normal limits.  No appreciable edema.  No erythema or calor.  Diminished pedal hair growth  Neurological: Protective sensation absent.  Vibratory sensation diminished  Musculoskeletal Exam: History of digital amputation right second toe DIPJ level. Hammer toe contractures bilateral. Bunion deformity.  Radiographic Exam: 02/24/2023 right foot 3 views weightbearing Normal osseous mineralization. Joint spaces preserved.  No fractures or acute osseous irregularities noted.  Pes planus foot type noted.  Moderate to severe bunion deformity noted with increased sesamoid rotation and valgus drift of first toe.  Hammertoe contractures noted.  No osteolysis or cortical erosion correlating with location of ulcer.  Assessment/Plan of  Care: 1. Diabetic ulcer of right midfoot associated with type 2 diabetes mellitus, limited to breakdown of skin (HCC)      No orders of the defined types were placed in this encounter.  None  Discussed clinical findings with patient today.  Radiographs reviewed with the patient -Hyperkeratotic tissue with dried heme and preulcerative callus right forefoot subthird metatarsal head debrided to patient tolerance with 312 scalpel blade without incident. -Underlying ulceration appears nearly healed at this time with pinpoint area of bleeding centrally at side of previous dried heme. -Silvadene and bandage applied -Padded  with felt offloading padding -Continue with previously prescribed gentamicin cream applied twice a day for another week. -We discussed preventative and palliative care of these lesions including supportive and accommodative shoegear, padding, prefabricated and custom molded accommodative orthoses, use of a pumice stone and lotions/creams daily. -Follow-up in 2 weeks to ensure no signs of recurrence and complete healing      Galan Ghee L. Marchia Bond, AACFAS [facility]     [address] [place], [place] [address]                Office [phone]  Fax [phone]

## 2023-02-28 NOTE — Addendum Note (Signed)
Addended by: Barbaraann Share on: 02/28/2023 07:43 AM   Modules accepted: Level of Service

## 2023-03-01 DIAGNOSIS — D508 Other iron deficiency anemias: Secondary | ICD-10-CM | POA: Diagnosis not present

## 2023-03-01 DIAGNOSIS — I129 Hypertensive chronic kidney disease with stage 1 through stage 4 chronic kidney disease, or unspecified chronic kidney disease: Secondary | ICD-10-CM | POA: Diagnosis not present

## 2023-03-01 DIAGNOSIS — E1165 Type 2 diabetes mellitus with hyperglycemia: Secondary | ICD-10-CM | POA: Diagnosis not present

## 2023-03-01 DIAGNOSIS — E782 Mixed hyperlipidemia: Secondary | ICD-10-CM | POA: Diagnosis not present

## 2023-03-01 DIAGNOSIS — N1832 Chronic kidney disease, stage 3b: Secondary | ICD-10-CM | POA: Diagnosis not present

## 2023-03-08 DIAGNOSIS — I129 Hypertensive chronic kidney disease with stage 1 through stage 4 chronic kidney disease, or unspecified chronic kidney disease: Secondary | ICD-10-CM | POA: Diagnosis not present

## 2023-03-08 DIAGNOSIS — Z0000 Encounter for general adult medical examination without abnormal findings: Secondary | ICD-10-CM | POA: Diagnosis not present

## 2023-03-08 DIAGNOSIS — R0989 Other specified symptoms and signs involving the circulatory and respiratory systems: Secondary | ICD-10-CM | POA: Diagnosis not present

## 2023-03-10 ENCOUNTER — Ambulatory Visit: Payer: Medicare Other | Admitting: Podiatry

## 2023-03-10 ENCOUNTER — Encounter: Payer: Self-pay | Admitting: Podiatry

## 2023-03-10 DIAGNOSIS — L97411 Non-pressure chronic ulcer of right heel and midfoot limited to breakdown of skin: Secondary | ICD-10-CM

## 2023-03-10 DIAGNOSIS — E11621 Type 2 diabetes mellitus with foot ulcer: Secondary | ICD-10-CM

## 2023-03-10 DIAGNOSIS — E1142 Type 2 diabetes mellitus with diabetic polyneuropathy: Secondary | ICD-10-CM

## 2023-03-10 NOTE — Progress Notes (Signed)
 Chief Complaint  Patient presents with   Wound Check    Patient states that everything has been ok since last visit    HPI: 89 y.o. female presents today as follow up evaluation of right forefoot ulceration.  Reports doing well since last visit.  She is accompanied by family member.  They have been compliant with wound care and offloading the lesion.  History of diabetes.  History of right second toe amputation at the DIPJ.  She does endorse neuropathy.  Past Medical History:  Diagnosis Date   Anemia    Arthritis    Basal cell carcinoma 11/23/2017   left elbow, right neck TX CX3 5FU    Basal cell carcinoma 04/04/2019   infil-right sideburn (CX35FU)   Chronic kidney disease    stage 3 ckd stage 3 b per dr Nicholos Johns 09-26-2019 note on chart   Complication of anesthesia    deifficulty waking up   COVID-19    Diabetic neuropathy (HCC)    Hyperlipidemia    Hypertension    Hypertensive retinopathy    OU   Infiltrative basal cell carcinoma (BCC) 04/22/2020   Right Zygomatic Area    Macular degeneration    OU   Nodular basal cell carcinoma (BCC) 04/22/2020   Right Anterior Neck   Pneumonia    Right carotid bruit per dr Nicholos Johns 09-26-2019 note on chart   SCC (squamous cell carcinoma) 11/23/2017   right forehead TX CX3 5FU   SCCA (squamous cell carcinoma) of skin 04/22/2020   Mid Parietal Scalp (in situ)   SCCA (squamous cell carcinoma) of skin 04/22/2020   Right Dorsal Hand (in situ)   Type 2 DM with CKD stage 3 and hypertension (HCC)     Past Surgical History:  Procedure Laterality Date   ABDOMINAL HYSTERECTOMY     AMPUTATION TOE Right 09/28/2019   Procedure: Right 2nd toe amputation - partial;  Surgeon: Park Liter, DPM;  Location: [facility] [place];  Service: Podiatry;  Laterality: Right;   CATARACT EXTRACTION     CHOLECYSTECTOMY     EYE SURGERY     HERNIA REPAIR     SPINE SURGERY     TONSILLECTOMY      No Known Allergies  ROS     Physical Exam: There were no vitals filed for this visit.  General: The patient is alert and oriented x3 in no acute distress.  Dermatology: Pedal skin atrophic.  Hyperkeratotic tissue right foot 2/3 metatarsal head with dried heme present.  Upon debridement, healed at this point, no underlying bleeding noted. Nail plates within normal limits for thickness and length following recent nail debridement. Fat pad atrophy to forefoot bilaterally.  Vascular: Palpable pedal pulses bilaterally. Capillary refill within normal limits.  No appreciable edema.  No erythema or calor.  Diminished pedal hair growth  Neurological: Protective sensation absent.  Vibratory sensation diminished  Musculoskeletal Exam: History of digital amputation right second toe DIPJ level. Hammer toe contractures bilateral. Bunion deformity.  Radiographic Exam: 02/24/2023 right foot 3 views weightbearing Normal osseous mineralization. Joint spaces preserved.  No fractures or acute osseous irregularities noted.  Pes planus foot type noted.  Moderate to severe bunion deformity noted with increased sesamoid rotation and valgus drift of first toe.  Hammertoe contractures noted.  No osteolysis or cortical erosion correlating with location of ulcer.  Assessment/Plan of Care: 1. Diabetic ulcer of right midfoot associated with type 2 diabetes mellitus, limited to breakdown of skin (HCC)  2. Diabetic peripheral neuropathy associated with type 2 diabetes mellitus (HCC)      No orders of the defined types were placed in this encounter.  None  Discussed clinical findings with patient today.  Radiographs reviewed with the patient -Hyperkeratotic tissue with dried heme and preulcerative callus right forefoot subthird metatarsal debrided to patient tolerance using 312 scalpel blade without incident today. -No signs of recurrence today.  Offloading felt padding applied today. -Continue to offload the area with felt padding -Advised  the use of topical urea creams or white vinegar scrubs to help manage the callus. -May follow-up with Dr. Donzetta Matters in approximately 8 weeks for diabetic footcare, or sooner if concern for reulceration arises or new symptoms occur.     Trai Ells L. Marchia Bond, AACFAS [facility]     [address] [place], [place] [address]                Office [phone]  Fax [phone]

## 2023-03-13 NOTE — Patient Instructions (Signed)
 Look for urea 40% cream or ointment and apply to the thickened dry skin / calluses. This can be bought over the counter, at a pharmacy or online such as [employer]. Can look for combination product with salicylic acid  More silicone and felt pads can be purchased from:  [email or web address]

## 2023-03-21 ENCOUNTER — Other Ambulatory Visit: Payer: Self-pay | Admitting: Internal Medicine

## 2023-03-21 DIAGNOSIS — Z8742 Personal history of other diseases of the female genital tract: Secondary | ICD-10-CM

## 2023-03-21 NOTE — Progress Notes (Signed)
 [facility] - Clinic Note  03/23/2023   CHIEF COMPLAINT Patient presents for Retina Follow Up  HISTORY OF PRESENT ILLNESS: Sue [name] is a 89 y.o. female who presents to the clinic today for:  HPI     Retina Follow Up   Patient presents with  Wet AMD.  In both eyes.  This started 6 weeks ago.  Duration of 6 weeks.  Since onset it is stable.  I, the attending physician,  performed the HPI with the patient and updated documentation appropriately.        Comments   6 week retina follow up ARMD OU and IVA OU pt is reporting no vision changes noticed she denies any flashes or floaters       Last edited by Rennis Chris, MD on 03/23/2023  5:46 PM.    Pt doesn't feel like her vision has change, but she had a hard time reading the eye chart this morning   Referring physician: [name], MD [address] [place],  [place] [address]  HISTORICAL INFORMATION:   Selected notes from the MEDICAL RECORD NUMBER Referred by Dr. Baker Pierini for concern of ARMD OU   CURRENT MEDICATIONS: Current Outpatient Medications (Ophthalmic Drugs)  Medication Sig   Polyethyl Glycol-Propyl Glycol (SYSTANE) 0.4-0.3 % SOLN Place 1 drop into both eyes 3 (three) times daily as needed (dry eyes).   RESTASIS 0.05 % ophthalmic emulsion Place 1 drop into both eyes 2 (two) times daily.   No current facility-administered medications for this visit. (Ophthalmic Drugs)   Current Outpatient Medications (Other)  Medication Sig   amLODipine (NORVASC) 10 MG tablet Take 1 tablet by mouth daily.   B-D ULTRAFINE III SHORT PEN 31G X 8 MM MISC USE AS DIRECTED ONCE DAILY SUBCUTANEOUSLY   Calcium Citrate-Vitamin D (CALCIUM + D PO)    calcium-vitamin D (OSCAL WITH D) 500-200 MG-UNIT tablet Take 1 tablet by mouth 2 (two) times daily.    Continuous Blood Gluc Sensor (FREESTYLE LIBRE 2 SENSOR) MISC See admin instructions.   Cyanocobalamin (B-12 PO)     diphenhydramine-acetaminophen (TYLENOL PM) 25-500 MG TABS tablet Take 1 tablet by mouth at bedtime as needed (sleep).   gentamicin cream (GARAMYCIN) 0.1 % Apply to ulcer right foot once daily.   Glucagon (GVOKE HYPOPEN 2-PACK) 0.5 MG/0.1ML SOAJ as needed for hypoglycemia   glucose blood test strip OneTouch Ultra Test strips  TEST ONCE A DAY ONCE A DAY FINGERSTICK 90   hydrALAZINE (APRESOLINE) 25 MG tablet Take 3 tablets (75 mg total) by mouth every 8 (eight) hours.   hydrochlorothiazide (HYDRODIURIL) 25 MG tablet Take 25 mg by mouth daily.   ibuprofen (ADVIL) 200 MG tablet Take 400 mg by mouth every 6 (six) hours as needed for moderate pain.   Insulin Degludec (TRESIBA FLEXTOUCH) 200 UNIT/ML SOPN Inject 36 Units into the skin at bedtime. (Patient taking differently: Inject 22 Units into the skin at bedtime.)   Insulin Pen Needle (PEN NEEDLES) 32G X 4 MM MISC BD Ultra-Fine Nano Pen Needle 32 gauge x 5/32"  USE AS DIRECTED DAILY   ketoconazole (NIZORAL) 2 % cream Apply topically.   Lancets (ONETOUCH ULTRASOFT) lancets OneTouch UltraSoft Lancets  USE TWICE DAILY   Melatonin 10 MG CAPS Take 10 mg by mouth at bedtime as needed (sleep).   meloxicam (MOBIC) 7.5 MG tablet Take 1 tablet (7.5 mg total) by mouth daily as needed for pain.   Multiple Vitamins-Minerals (ADULT ONE DAILY GUMMIES) CHEW  Chew 1 tablet by mouth 2 (two) times daily. Centrum   Multiple Vitamins-Minerals (AIRBORNE) TBEF Take 1 tablet by mouth daily as needed (immune support).   Multiple Vitamins-Minerals (CENTRUM SILVER PO)    Multiple Vitamins-Minerals (PRESERVISION AREDS 2) CAPS Take 1 capsule by mouth 2 (two) times daily.   mupirocin ointment (BACTROBAN) 2 % Apply to right foot ulcer once daily. (Patient taking differently: Apply 1 application  topically daily as needed (wound care). Apply to right foot ulcer once daily.)   nortriptyline (PAMELOR) 25 MG capsule Take 1 capsule (25 mg total) by mouth at bedtime. (Patient taking  differently: Take 25 mg by mouth 2 (two) times daily.)   Omega-3 Fatty Acids (FISH OIL) 1200 MG CAPS Take 1,200 mg by mouth daily with lunch.    omeprazole (PRILOSEC) 20 MG capsule Take 1 capsule by mouth daily.   ondansetron (ZOFRAN) 4 MG tablet Take 4 mg by mouth 3 (three) times daily as needed.   ONE TOUCH ULTRA TEST test strip    PRESCRIPTION MEDICATION by Intravitreal route every 30 (thirty) days. Ophthalmic injection at MD office in left eye   silver sulfADIAZINE (SILVADENE) 1 % cream Apply pea-sized amount to wound daily. (Patient taking differently: Apply 1 application  topically daily. Apply pea-sized amount to wound daily.)   simvastatin (ZOCOR) 20 MG tablet Take 1 tablet by mouth daily.   traMADol (ULTRAM) 50 MG tablet Take 1 tablet (50 mg total) by mouth 2 (two) times daily as needed.   vitamin B-12 (CYANOCOBALAMIN) 1000 MCG tablet Take 1,000 mcg by mouth 2 (two) times daily.    Current Facility-Administered Medications (Other)  Medication Route   Bevacizumab (AVASTIN) SOLN 1.25 mg Intravitreal   Bevacizumab (AVASTIN) SOLN 1.25 mg Intravitreal   REVIEW OF SYSTEMS: ROS   Positive for: Neurological, Skin, Genitourinary, Musculoskeletal, Endocrine, Eyes Negative for: Constitutional, Gastrointestinal, HENT, Cardiovascular, Respiratory, Psychiatric, Allergic/Imm, Heme/Lymph Last edited by Etheleen Mayhew, COT on 03/23/2023  3:02 PM.       ALLERGIES No Known Allergies  PAST MEDICAL HISTORY Past Medical History:  Diagnosis Date   Anemia    Arthritis    Basal cell carcinoma 11/23/2017   left elbow, right neck TX CX3 5FU    Basal cell carcinoma 04/04/2019   infil-right sideburn (CX35FU)   Chronic kidney disease    stage 3 ckd stage 3 b per dr Nicholos Johns 09-26-2019 note on chart   Complication of anesthesia    deifficulty waking up   COVID-19    Diabetic neuropathy (HCC)    Hyperlipidemia    Hypertension    Hypertensive retinopathy    OU   Infiltrative basal  cell carcinoma (BCC) 04/22/2020   Right Zygomatic Area    Macular degeneration    OU   Nodular basal cell carcinoma (BCC) 04/22/2020   Right Anterior Neck   Pneumonia    Right carotid bruit per dr Nicholos Johns 09-26-2019 note on chart   SCC (squamous cell carcinoma) 11/23/2017   right forehead TX CX3 5FU   SCCA (squamous cell carcinoma) of skin 04/22/2020   Mid Parietal Scalp (in situ)   SCCA (squamous cell carcinoma) of skin 04/22/2020   Right Dorsal Hand (in situ)   Type 2 DM with CKD stage 3 and hypertension (HCC)    Past Surgical History:  Procedure Laterality Date   ABDOMINAL HYSTERECTOMY     AMPUTATION TOE Right 09/28/2019   Procedure: Right 2nd toe amputation - partial;  Surgeon: Park Liter, DPM;  Location: [facility];  Service: Podiatry;  Laterality: Right;   CATARACT EXTRACTION     CHOLECYSTECTOMY     EYE SURGERY     HERNIA REPAIR     SPINE SURGERY     TONSILLECTOMY     FAMILY HISTORY History reviewed. No pertinent family history.  SOCIAL HISTORY Social History   Tobacco Use   Smoking status: Former   Smokeless tobacco: Never  Advertising account planner   Vaping status: Never Used  Substance Use Topics   Alcohol use: No    Alcohol/week: 0.0 standard drinks of alcohol   Drug use: No       OPHTHALMIC EXAM:  Base Eye Exam     Visual Acuity (Snellen - Linear)       Right Left   Dist cc 20/50 -2 20/50 -2   Dist ph cc NI     Correction: Glasses         Tonometry (Tonopen, 3:09 PM)       Right Left   Pressure 16 17         Pupils       Pupils Dark Light Shape React APD   Right PERRL 4 3 Round Brisk None   Left PERRL 4 3 Round Brisk None         Visual Fields       Left Right    Full Full         Extraocular Movement       Right Left    Full, Ortho Full, Ortho         Neuro/Psych     Oriented x3: Yes   Mood/Affect: Normal         Dilation     Both eyes: 2.5% Phenylephrine @ 3:09 PM           Slit Lamp  and Fundus Exam     Slit Lamp Exam       Right Left   Lids/Lashes Dermatochalasis - upper lid, Telangiectasia, mild MGD Dermatochalasis - upper lid, Telangiectasia, Meibomian gland dysfunction   Conjunctiva/Sclera White and quiet White and quiet   Cornea Temporal Well healed cataract wounds, mild Arcus, trace endo pigment nasally, 1+PEE Arcus, Temporal Well healed cataract wounds, 1-2+ PEE, tear film debris   Anterior Chamber Deep and quiet Deep and quiet   Iris Round and moderately dilated to 4.32mm Round and moderately dilated to 5mm, Iris atrophy from 0100 to 0230, peripheral iridotomy at 0200   Lens 3-piece PC IOL in good position PC IOL in good position   Anterior Vitreous Vitreous syneresis, Posterior vitreous detachment, Weiss ring, vitreous condensations Vitreous syneresis, PVD, vitreous condensations         Fundus Exam       Right Left   Disc 360 Peripapillary atrophy, diffuse mild pallor, compact, sharp rim mild pallor, Peripapillary atrophy and pigmentation   C/D Ratio 0.1 0.2   Macula Blunted foveal reflex, +PED / CNV, RPE mottling, clumping and early atrophy, Drusen, No heme Blunted foveal reflex, low lying PED / +CNVM with stable improvement in IRF and SRF, RPE mottling and clumping, Drusen, No heme, early atrophy   Vessels Attenuated, mild tortuosity Attenuated, mild tortuosity   Periphery Attached, 360 Reticular degeneration, No heme Attached, 360 Reticular degeneration, No heme           Refraction     Wearing Rx       Sphere Cylinder Axis Add   Right -2.00 +1.25 161 +2.50  Left +0.25 +0.50 004 +2.50           IMAGING AND PROCEDURES  Imaging and Procedures for @TODAY @  OCT, Retina - OU - Both Eyes       Right Eye Quality was good. Central Foveal Thickness: 216. Progression has been stable. Findings include no IRF, no SRF, abnormal foveal contour, retinal drusen , subretinal hyper-reflective material, intraretinal hyper-reflective material, pigment  epithelial detachment, outer retinal atrophy (Stable improvement in focal SRHM; central IRHM slightly increased; shallow persistent central PED).   Left Eye Quality was good. Central Foveal Thickness: 209. Progression has been stable. Findings include normal foveal contour, no IRF, no SRF, retinal drusen , epiretinal membrane, pigment epithelial detachment, outer retinal atrophy (Stable improvement in shallow SRF and low lying PED; patchy central ORA, partial PVD).   Notes *Images captured and stored on drive  Diagnosis / Impression:  OD: Stable improvement in focal SRHM; central IRHM slightly increased; shallow persistent central PED OS: Exudative ARMD -- stable improvement in shallow SRF and low lying PED; patchy central ORA, partial PVD  Clinical management:  See below  Abbreviations: NFP - Normal foveal profile. CME - cystoid macular edema. PED - pigment epithelial detachment. IRF - intraretinal fluid. SRF - subretinal fluid. EZ - ellipsoid zone. ERM - epiretinal membrane. ORA - outer retinal atrophy. ORT - outer retinal tubulation. SRHM - subretinal hyper-reflective material       Intravitreal Injection, Pharmacologic Agent - OD - Right Eye       Time Out 03/23/2023. 3:55 PM. Confirmed correct patient, procedure, site, and patient consented.   Anesthesia Topical anesthesia was used. Anesthetic medications included Lidocaine 2%, Proparacaine 0.5%.   Procedure Preparation included 5% betadine to ocular surface, eyelid speculum. A supplied (32g) needle was used.   Injection: 1.25 mg Bevacizumab 1.25mg /0.57ml   Route: Intravitreal, Site: Right Eye   NDC: P3213405, Lot: [id number], Expiration date: 04/30/2023   Post-op Post injection exam found visual acuity of at least counting fingers. The patient tolerated the procedure well. There were no complications. The patient received written and verbal post procedure care education.      Intravitreal Injection, Pharmacologic Agent  - OS - Left Eye       Time Out 03/23/2023. 3:55 PM. Confirmed correct patient, procedure, site, and patient consented.   Anesthesia Topical anesthesia was used. Anesthetic medications included Lidocaine 2%, Proparacaine 0.5%.   Procedure Preparation included 5% betadine to ocular surface, eyelid speculum. A (32g) needle was used.   Injection: 1.25 mg Bevacizumab 1.25mg /0.48ml   Route: Intravitreal, Site: Left Eye   NDC: P3213405, Lot: [id number], Expiration date: 05/18/2023   Post-op Post injection exam found visual acuity of at least counting fingers. The patient tolerated the procedure well. There were no complications. The patient received written and verbal post procedure care education. Post injection medications were not given.             ASSESSMENT/PLAN:    ICD-10-CM   1. Exudative age-related macular degeneration of left eye with active choroidal neovascularization (HCC)  H35.3221 OCT, Retina - OU - Both Eyes    Intravitreal Injection, Pharmacologic Agent - OS - Left Eye    Bevacizumab (AVASTIN) SOLN 1.25 mg    CANCELED: Intravitreal Injection, Pharmacologic Agent - OD - Right Eye    2. Exudative age-related macular degeneration of right eye with active choroidal neovascularization (HCC)  H35.3211 OCT, Retina - OU - Both Eyes    Intravitreal Injection, Pharmacologic Agent - OD - Right Eye  Bevacizumab (AVASTIN) SOLN 1.25 mg    3. Diabetes mellitus type 2 without retinopathy (HCC)  E11.9     4. Long term (current) use of oral hypoglycemic drugs  Z79.84     5. Essential hypertension  I10     6. Hypertensive retinopathy of both eyes  H35.033     7. Pseudophakia of both eyes  Z96.1     8. Dry eyes  H04.123      1. Exudative age-related macular degeneration, left eye - conversion of OS from nonexudative to exudative ARMD prior to 02.05.20 visit - s/p IVA OS #1 (02.05.20), #2 (03.04.20), #3 (04.30.20), #4 (06.02.20), #5 (07.14.20), #6 (9.1.20), #7 (10.27.20),  #8 (12.23.20), #9 (02.03.21), #10 (03.17.21), #11 (04.28.21), #12 (06.09.21), #13 (08.04.21), #14 (09.29.21), #15 (11.22.2021), #16 (01.26.22), #17 (04.06.22), #18 (06.28.22), #19 (9.13.22), #20 (11.29.22), #21 (01.31.23), #22 (04.04.23), #23 (05.30.23), #24 (07.27.23), #25 (09.19.23), #26 (11.07.23), #27 (12.29.23), #28 (02.14.24), #29 (06.04.24), #30 (07.16.24), #31 (08.27.24), #32 (01.21.25) - s/p IVE OS #1 (10.01.24), #2 (11.08.24), #3 (12.10.24) **history of recurrent / worsening SRF at 9 wk interval, noted on 04.04.23 visit, at 8 week interval on 09.19.23 and 06.04.24** - OCT shows OS: stable improvement in shallow SRF and low lying PED; patchy central ORA, partial PVD at 5 weeks  - BCVA OS stable at 20/50  - recommend IVA OS #33 today, 02.26.25 w/ f/u in 6 wks -- [employer] funding unavailable  - pt wishes to proceed with IVA injection - RBA of procedure discussed, questions answered  - see procedure notes  - Avastin informed consent obtained, signed and scanned 01.21.25 (OU)  - Eylea approved for 2025 -- but no funding for [employer]  - f/u in 6 wks for DFE, OCT, possible injection OS   2. Exudative age-related macular degeneration, right eye  - conversion to exu ARMD on 06.04.24 exam  - s/p IVA OD #1 (06.04.24), #2 (07.16.24), #3 (08.27.24), #4 (01.21.25)  - s/p IVE OD #1 (10.01.24), #2 (11.08.24), #3 (12.10.24) - OCT shows OD: Stable improvement in focal SRHM, and central IRHM slightly increased, shallow persistent central PED  at 5 weeks  - recommend IVA OD #5 today, 02.26.25 with follow up in 6 weeks  - pt wishes to proceed with injection  - RBA of procedure discussed, questions answered - IVA informed consent obtained and signed, 01.21.25 (OU) - see procedure note  - f/u in 6 wks, DFE, OCT, possible injxn OD  3,4. Diabetes mellitus, type 2 without retinopathy - The incidence, risk factors for progression, natural history and treatment options for diabetic retinopathy  were  discussed with patient.   - The need for close monitoring of blood glucose, blood pressure, and serum lipids, avoiding cigarette or any type of tobacco, and the need for long term follow up was also discussed with patient.  - f/u in 1 year  5,6. Hypertensive retinopathy OU  - discussed importance of tight BP control  - continue to monitor  7. Pseudophakia OU  - s/p CE/IOL OU  - beautiful surgeries, doing well  - continue to monitor  8. Dry eyes OU  - pt on restasis BID OU - recommend artificial tears and lubricating ointment as needed   Ophthalmic Meds Ordered this visit:  Meds ordered this encounter  Medications   Bevacizumab (AVASTIN) SOLN 1.25 mg   Bevacizumab (AVASTIN) SOLN 1.25 mg    Return in about 6 weeks (around 05/04/2023) for ex ARMD OU, Dilated Exam, OCT, Possible Injxn.  There  are no Patient Instructions on file for this visit.   This document serves as a record of services personally performed by Karie Chimera, MD, PhD. It was created on their behalf by Glee Arvin. Manson Passey, OA an ophthalmic technician. The creation of this record is the provider's dictation and/or activities during the visit.    Electronically signed by: Glee Arvin. Manson Passey, OA 03/24/23 5:09 PM  Karie Chimera, M.D., Ph.D. Diseases & Surgery of the Retina and Vitreous [facility]  I have reviewed the above documentation for accuracy and completeness, and I agree with the above. Karie Chimera, M.D., Ph.D. 03/24/23 5:11 PM   Abbreviations: M myopia (nearsighted); A astigmatism; H hyperopia (farsighted); P presbyopia; Mrx spectacle prescription;  CTL contact lenses; OD right eye; OS left eye; OU both eyes  XT exotropia; ET esotropia; PEK punctate epithelial keratitis; PEE punctate epithelial erosions; DES dry eye syndrome; MGD meibomian gland dysfunction; ATs artificial tears; PFAT's preservative free artificial tears; NSC nuclear sclerotic cataract; PSC posterior subcapsular  cataract; ERM epi-retinal membrane; PVD posterior vitreous detachment; RD retinal detachment; DM diabetes mellitus; DR diabetic retinopathy; NPDR non-proliferative diabetic retinopathy; PDR proliferative diabetic retinopathy; CSME clinically significant macular edema; DME diabetic macular edema; dbh dot blot hemorrhages; CWS cotton wool spot; POAG primary open angle glaucoma; C/D cup-to-disc ratio; HVF humphrey visual field; GVF goldmann visual field; OCT optical coherence tomography; IOP intraocular pressure; BRVO Branch retinal vein occlusion; CRVO central retinal vein occlusion; CRAO central retinal artery occlusion; BRAO branch retinal artery occlusion; RT retinal tear; SB scleral buckle; PPV pars plana vitrectomy; VH Vitreous hemorrhage; PRP panretinal laser photocoagulation; IVK intravitreal kenalog; VMT vitreomacular traction; MH Macular hole;  NVD neovascularization of the disc; NVE neovascularization elsewhere; AREDS age related eye disease study; ARMD age related macular degeneration; POAG primary open angle glaucoma; EBMD epithelial/anterior basement membrane dystrophy; ACIOL anterior chamber intraocular lens; IOL intraocular lens; PCIOL posterior chamber intraocular lens; Phaco/IOL phacoemulsification with intraocular lens placement; PRK photorefractive keratectomy; LASIK laser assisted in situ keratomileusis; HTN hypertension; DM diabetes mellitus; COPD chronic obstructive pulmonary disease

## 2023-03-22 ENCOUNTER — Encounter (INDEPENDENT_AMBULATORY_CARE_PROVIDER_SITE_OTHER): Payer: Medicare Other | Admitting: Ophthalmology

## 2023-03-23 ENCOUNTER — Ambulatory Visit (INDEPENDENT_AMBULATORY_CARE_PROVIDER_SITE_OTHER): Payer: Medicare Other | Admitting: Ophthalmology

## 2023-03-23 ENCOUNTER — Encounter (INDEPENDENT_AMBULATORY_CARE_PROVIDER_SITE_OTHER): Payer: Self-pay | Admitting: Ophthalmology

## 2023-03-23 DIAGNOSIS — H353231 Exudative age-related macular degeneration, bilateral, with active choroidal neovascularization: Secondary | ICD-10-CM | POA: Diagnosis not present

## 2023-03-23 DIAGNOSIS — E119 Type 2 diabetes mellitus without complications: Secondary | ICD-10-CM

## 2023-03-23 DIAGNOSIS — Z7984 Long term (current) use of oral hypoglycemic drugs: Secondary | ICD-10-CM | POA: Diagnosis not present

## 2023-03-23 DIAGNOSIS — Z961 Presence of intraocular lens: Secondary | ICD-10-CM | POA: Diagnosis not present

## 2023-03-23 DIAGNOSIS — H353221 Exudative age-related macular degeneration, left eye, with active choroidal neovascularization: Secondary | ICD-10-CM

## 2023-03-23 DIAGNOSIS — H04123 Dry eye syndrome of bilateral lacrimal glands: Secondary | ICD-10-CM

## 2023-03-23 DIAGNOSIS — I10 Essential (primary) hypertension: Secondary | ICD-10-CM | POA: Diagnosis not present

## 2023-03-23 DIAGNOSIS — H35033 Hypertensive retinopathy, bilateral: Secondary | ICD-10-CM | POA: Diagnosis not present

## 2023-03-23 DIAGNOSIS — H353211 Exudative age-related macular degeneration, right eye, with active choroidal neovascularization: Secondary | ICD-10-CM

## 2023-03-23 MED ORDER — BEVACIZUMAB CHEMO INJECTION 1.25MG/0.05ML SYRINGE FOR KALEIDOSCOPE
1.2500 mg | INTRAVITREAL | Status: AC | PRN
  Administered 2023-03-23: 1.25 mg via INTRAVITREAL

## 2023-04-12 ENCOUNTER — Ambulatory Visit: Admitting: Podiatry

## 2023-04-12 ENCOUNTER — Encounter: Payer: Self-pay | Admitting: Podiatry

## 2023-04-12 VITALS — Ht 66.0 in | Wt 170.0 lb

## 2023-04-12 DIAGNOSIS — L84 Corns and callosities: Secondary | ICD-10-CM

## 2023-04-12 DIAGNOSIS — B351 Tinea unguium: Secondary | ICD-10-CM | POA: Diagnosis not present

## 2023-04-12 NOTE — Progress Notes (Signed)
  Subjective:  Patient ID: Sue [name], female    DOB: [birth year],  MRN: [id number]  Chief Complaint  Patient presents with   Callouses    " My right foot has a bad callous on the bottom of my foot under my toes that is painful to walk on"     89 y.o. female presents with the above complaint. History confirmed with patient.  This previously was an ulcer that has healed.  The right great toenail is also taking the second toe next to it  Objective:  Physical Exam: warm, good capillary refill, no trophic changes or ulcerative lesions, normal DP and PT pulses, and preulcerative callus submetatarsal 3 elongated thickened right hallux nail.  Assessment:   1. Callus of foot   2. Onychomycosis      Plan:  Patient was evaluated and treated and all questions answered.  Callus was debrided sharply with a scalpel to a tolerable level continues and offloading pads also recommended using urea 40% cream.  They have upcoming follow-up scheduled with Dr. Eloy End in mid May for diabetic footcare and they will follow-up with her for this.  Return to see me as needed.  Hallux nail was also debrided as a courtesy to remove pressure from the second toe and prevent ulceration  Return if symptoms worsen or fail to improve.

## 2023-04-12 NOTE — Patient Instructions (Signed)
 Look for urea 40% cream or ointment and apply to the thickened dry skin / calluses. This can be bought over the counter, at a pharmacy or online such as [employer].

## 2023-05-02 NOTE — Progress Notes (Signed)
 [facility] - Clinic Note  05/04/2023   CHIEF COMPLAINT Patient presents for Retina Follow Up  HISTORY OF PRESENT ILLNESS: Sue [name] is a 89 y.o. female who presents to the clinic today for:  HPI     Retina Follow Up   Patient presents with  Wet AMD.  In both eyes.  This started 6 weeks ago.  Duration of 6 weeks.  Since onset it is stable.  I, the attending physician,  performed the HPI with the patient and updated documentation appropriately.        Comments   6 week retina follow up ARMD OU and IVA OU pt is reporting no vision changes noticed she denies any flashes or floaters       Last edited by Rennis Chris, MD on 05/04/2023  9:53 PM.      Referring physician: [name], MD [address] [place],  [place] [address]  HISTORICAL INFORMATION:   Selected notes from the MEDICAL RECORD NUMBER Referred by Dr. Baker Pierini for concern of ARMD OU   CURRENT MEDICATIONS: Current Outpatient Medications (Ophthalmic Drugs)  Medication Sig   Polyethyl Glycol-Propyl Glycol (SYSTANE) 0.4-0.3 % SOLN Place 1 drop into both eyes 3 (three) times daily as needed (dry eyes).   RESTASIS 0.05 % ophthalmic emulsion Place 1 drop into both eyes 2 (two) times daily.   No current facility-administered medications for this visit. (Ophthalmic Drugs)   Current Outpatient Medications (Other)  Medication Sig   amLODipine (NORVASC) 10 MG tablet Take 1 tablet by mouth daily.   B-D ULTRAFINE III SHORT PEN 31G X 8 MM MISC USE AS DIRECTED ONCE DAILY SUBCUTANEOUSLY   Calcium Citrate-Vitamin D (CALCIUM + D PO)    calcium-vitamin D (OSCAL WITH D) 500-200 MG-UNIT tablet Take 1 tablet by mouth 2 (two) times daily.    Continuous Blood Gluc Sensor (FREESTYLE LIBRE 2 SENSOR) MISC See admin instructions.   Cyanocobalamin (B-12 PO)    diphenhydramine-acetaminophen (TYLENOL PM) 25-500 MG TABS tablet Take 1 tablet by mouth at bedtime as needed (sleep).   gentamicin  cream (GARAMYCIN) 0.1 % Apply to ulcer right foot once daily.   Glucagon (GVOKE HYPOPEN 2-PACK) 0.5 MG/0.1ML SOAJ as needed for hypoglycemia   glucose blood test strip OneTouch Ultra Test strips  TEST ONCE A DAY ONCE A DAY FINGERSTICK 90   hydrALAZINE (APRESOLINE) 25 MG tablet Take 3 tablets (75 mg total) by mouth every 8 (eight) hours.   hydrochlorothiazide (HYDRODIURIL) 25 MG tablet Take 25 mg by mouth daily.   ibuprofen (ADVIL) 200 MG tablet Take 400 mg by mouth every 6 (six) hours as needed for moderate pain.   Insulin Degludec (TRESIBA FLEXTOUCH) 200 UNIT/ML SOPN Inject 36 Units into the skin at bedtime. (Patient taking differently: Inject 22 Units into the skin at bedtime.)   Insulin Pen Needle (PEN NEEDLES) 32G X 4 MM MISC BD Ultra-Fine Nano Pen Needle 32 gauge x 5/32"  USE AS DIRECTED DAILY   ketoconazole (NIZORAL) 2 % cream Apply topically.   Lancets (ONETOUCH ULTRASOFT) lancets OneTouch UltraSoft Lancets  USE TWICE DAILY   Melatonin 10 MG CAPS Take 10 mg by mouth at bedtime as needed (sleep).   meloxicam (MOBIC) 7.5 MG tablet Take 1 tablet (7.5 mg total) by mouth daily as needed for pain.   Multiple Vitamins-Minerals (ADULT ONE DAILY GUMMIES) CHEW Chew 1 tablet by mouth 2 (two) times daily. Centrum   Multiple Vitamins-Minerals (AIRBORNE) TBEF Take 1 tablet by  mouth daily as needed (immune support).   Multiple Vitamins-Minerals (CENTRUM SILVER PO)    Multiple Vitamins-Minerals (PRESERVISION AREDS 2) CAPS Take 1 capsule by mouth 2 (two) times daily.   mupirocin ointment (BACTROBAN) 2 % Apply to right foot ulcer once daily. (Patient taking differently: Apply 1 application  topically daily as needed (wound care). Apply to right foot ulcer once daily.)   nortriptyline (PAMELOR) 25 MG capsule Take 1 capsule (25 mg total) by mouth at bedtime. (Patient taking differently: Take 25 mg by mouth 2 (two) times daily.)   Omega-3 Fatty Acids (FISH OIL) 1200 MG CAPS Take 1,200 mg by mouth daily with  lunch.    omeprazole (PRILOSEC) 20 MG capsule Take 1 capsule by mouth daily.   ondansetron (ZOFRAN) 4 MG tablet Take 4 mg by mouth 3 (three) times daily as needed.   ONE TOUCH ULTRA TEST test strip    PRESCRIPTION MEDICATION by Intravitreal route every 30 (thirty) days. Ophthalmic injection at MD office in left eye   silver sulfADIAZINE (SILVADENE) 1 % cream Apply pea-sized amount to wound daily. (Patient taking differently: Apply 1 application  topically daily. Apply pea-sized amount to wound daily.)   simvastatin (ZOCOR) 20 MG tablet Take 1 tablet by mouth daily.   traMADol (ULTRAM) 50 MG tablet Take 1 tablet (50 mg total) by mouth 2 (two) times daily as needed.   vitamin B-12 (CYANOCOBALAMIN) 1000 MCG tablet Take 1,000 mcg by mouth 2 (two) times daily.    Current Facility-Administered Medications (Other)  Medication Route   Bevacizumab (AVASTIN) SOLN 1.25 mg Intravitreal   Bevacizumab (AVASTIN) SOLN 1.25 mg Intravitreal   REVIEW OF SYSTEMS: ROS   Positive for: Neurological, Skin, Genitourinary, Musculoskeletal, Endocrine, Eyes Negative for: Constitutional, Gastrointestinal, HENT, Cardiovascular, Respiratory, Psychiatric, Allergic/Imm, Heme/Lymph Last edited by Etheleen Mayhew, COT on 05/04/2023  2:26 PM.     ALLERGIES No Known Allergies  PAST MEDICAL HISTORY Past Medical History:  Diagnosis Date   Anemia    Arthritis    Basal cell carcinoma 11/23/2017   left elbow, right neck TX CX3 5FU    Basal cell carcinoma 04/04/2019   infil-right sideburn (CX35FU)   Chronic kidney disease    stage 3 ckd stage 3 b per dr Nicholos Johns 09-26-2019 note on chart   Complication of anesthesia    deifficulty waking up   COVID-19    Diabetic neuropathy (HCC)    Hyperlipidemia    Hypertension    Hypertensive retinopathy    OU   Infiltrative basal cell carcinoma (BCC) 04/22/2020   Right Zygomatic Area    Macular degeneration    OU   Nodular basal cell carcinoma (BCC) 04/22/2020    Right Anterior Neck   Pneumonia    Right carotid bruit per dr Nicholos Johns 09-26-2019 note on chart   SCC (squamous cell carcinoma) 11/23/2017   right forehead TX CX3 5FU   SCCA (squamous cell carcinoma) of skin 04/22/2020   Mid Parietal Scalp (in situ)   SCCA (squamous cell carcinoma) of skin 04/22/2020   Right Dorsal Hand (in situ)   Type 2 DM with CKD stage 3 and hypertension (HCC)    Past Surgical History:  Procedure Laterality Date   ABDOMINAL HYSTERECTOMY     AMPUTATION TOE Right 09/28/2019   Procedure: Right 2nd toe amputation - partial;  Surgeon: Park Liter, DPM;  Location: [facility];  Service: Podiatry;  Laterality: Right;   CATARACT EXTRACTION     CHOLECYSTECTOMY  EYE SURGERY     HERNIA REPAIR     SPINE SURGERY     TONSILLECTOMY     FAMILY HISTORY History reviewed. No pertinent family history.  SOCIAL HISTORY Social History   Tobacco Use   Smoking status: Former   Smokeless tobacco: Never  Advertising account planner   Vaping status: Never Used  Substance Use Topics   Alcohol use: No    Alcohol/week: 0.0 standard drinks of alcohol   Drug use: No       OPHTHALMIC EXAM:  Base Eye Exam     Visual Acuity (Snellen - Linear)       Right Left   Dist cc 20/60 -2 20/50 -1   Dist ph cc NI NI         Tonometry (Tonopen, 2:32 PM)       Right Left   Pressure 14 14         Pupils       Pupils Dark Light Shape React APD   Right PERRL 4 3 Round Brisk None   Left PERRL 4 3 Round Brisk None         Visual Fields       Left Right    Full Full         Extraocular Movement       Right Left    Full, Ortho Full, Ortho         Neuro/Psych     Oriented x3: Yes   Mood/Affect: Normal         Dilation     Both eyes: 2.5% Phenylephrine @ 2:32 PM           Slit Lamp and Fundus Exam     Slit Lamp Exam       Right Left   Lids/Lashes Dermatochalasis - upper lid, Telangiectasia, mild MGD Dermatochalasis - upper lid,  Telangiectasia, Meibomian gland dysfunction   Conjunctiva/Sclera White and quiet White and quiet   Cornea Temporal Well healed cataract wounds, mild Arcus, trace endo pigment nasally, 1+PEE Arcus, Temporal Well healed cataract wounds, 1-2+ PEE, tear film debris   Anterior Chamber Deep and quiet Deep and quiet   Iris Round and moderately dilated to 4.82mm Round and moderately dilated to 5mm, Iris atrophy from 0100 to 0230, peripheral iridotomy at 0200   Lens 3-piece PC IOL in good position PC IOL in good position   Anterior Vitreous Vitreous syneresis, Posterior vitreous detachment, Weiss ring, vitreous condensations Vitreous syneresis, PVD, vitreous condensations         Fundus Exam       Right Left   Disc 360 Peripapillary atrophy, diffuse mild pallor, compact, sharp rim mild pallor, Peripapillary atrophy and pigmentation   C/D Ratio 0.1 0.2   Macula Blunted foveal reflex, +PED- slightly increased / CNV, RPE mottling, clumping and early atrophy, Drusen, No heme Blunted foveal reflex, low lying PED / +CNVM with stable improvement in IRF and SRF, RPE mottling and clumping, Drusen, No heme, early atrophy   Vessels Attenuated, mild tortuosity Attenuated, mild tortuosity   Periphery Attached, 360 Reticular degeneration, No heme Attached, 360 Reticular degeneration, No heme           Refraction     Wearing Rx       Sphere Cylinder Axis Add   Right -2.00 +1.25 161 +2.50   Left +0.25 +0.50 004 +2.50           IMAGING AND PROCEDURES  Imaging and Procedures for @TODAY @  OCT, Retina - OU - Both Eyes       Right Eye Quality was good. Central Foveal Thickness: 217. Progression has worsened. Findings include no IRF, no SRF, abnormal foveal contour, retinal drusen , subretinal hyper-reflective material, intraretinal hyper-reflective material, pigment epithelial detachment, outer retinal atrophy (Stable improvement in focal SRHM/IRHM, shallow central PED - slightly increased).   Left  Eye Quality was good. Central Foveal Thickness: 228. Progression has been stable. Findings include normal foveal contour, no IRF, no SRF, retinal drusen , epiretinal membrane, pigment epithelial detachment, outer retinal atrophy (Stable improvement in shallow SRF and low lying PED; patchy central ORA, partial PVD).   Notes *Images captured and stored on drive  Diagnosis / Impression:  OD: Stable improvement in focal SRHM/IRHM, shallow central PED - slightly increased OS: Exudative ARMD -- stable improvement in shallow SRF and low lying PED; patchy central ORA, partial PVD  Clinical management:  See below  Abbreviations: NFP - Normal foveal profile. CME - cystoid macular edema. PED - pigment epithelial detachment. IRF - intraretinal fluid. SRF - subretinal fluid. EZ - ellipsoid zone. ERM - epiretinal membrane. ORA - outer retinal atrophy. ORT - outer retinal tubulation. SRHM - subretinal hyper-reflective material       Intravitreal Injection, Pharmacologic Agent - OD - Right Eye       Time Out 05/04/2023. 3:31 PM. Confirmed correct patient, procedure, site, and patient consented.   Anesthesia Topical anesthesia was used. Anesthetic medications included Lidocaine 2%, Proparacaine 0.5%.   Procedure Preparation included 5% betadine to ocular surface, eyelid speculum. A supplied (32g) needle was used.   Injection: 1.25 mg Bevacizumab 1.25mg /0.16ml   Route: Intravitreal, Site: Right Eye   NDC: P3213405, Lot: [id number], Expiration date: 06/11/2023   Post-op Post injection exam found visual acuity of at least counting fingers. The patient tolerated the procedure well. There were no complications. The patient received written and verbal post procedure care education.      Intravitreal Injection, Pharmacologic Agent - OS - Left Eye       Time Out 05/04/2023. 3:31 PM. Confirmed correct patient, procedure, site, and patient consented.   Anesthesia Topical anesthesia was used.  Anesthetic medications included Lidocaine 2%, Proparacaine 0.5%, Akten 3.5%.   Procedure Preparation included 5% betadine to ocular surface, eyelid speculum. A supplied (32g) needle was used.   Injection: 1.25 mg Bevacizumab 1.25mg /0.14ml   Route: Intravitreal, Site: Left Eye   NDC: P3213405, Lot: [id number] , Expiration date: 06/04/2023   Post-op Post injection exam found visual acuity of at least counting fingers. The patient tolerated the procedure well. There were no complications. The patient received written and verbal post procedure care education. Post injection medications were not given.            ASSESSMENT/PLAN:    ICD-10-CM   1. Exudative age-related macular degeneration of left eye with active choroidal neovascularization (HCC)  H35.3221 OCT, Retina - OU - Both Eyes    Intravitreal Injection, Pharmacologic Agent - OS - Left Eye    Bevacizumab (AVASTIN) SOLN 1.25 mg    2. Exudative age-related macular degeneration of right eye with active choroidal neovascularization (HCC)  H35.3211 OCT, Retina - OU - Both Eyes    Intravitreal Injection, Pharmacologic Agent - OD - Right Eye    Bevacizumab (AVASTIN) SOLN 1.25 mg    3. Diabetes mellitus type 2 without retinopathy (HCC)  E11.9     4. Long term (current) use of oral hypoglycemic drugs  Z79.84     5.  Essential hypertension  I10     6. Hypertensive retinopathy of both eyes  H35.033     7. Pseudophakia of both eyes  Z96.1     8. Dry eyes  H04.123      1. Exudative age-related macular degeneration, left eye - conversion of OS from nonexudative to exudative ARMD prior to 02.05.20 visit - s/p IVA OS #1 (02.05.20), #2 (03.04.20), #3 (04.30.20), #4 (06.02.20), #5 (07.14.20), #6 (9.1.20), #7 (10.27.20), #8 (12.23.20), #9 (02.03.21), #10 (03.17.21), #11 (04.28.21), #12 (06.09.21), #13 (08.04.21), #14 (09.29.21), #15 (11.22.2021), #16 (01.26.22), #17 (04.06.22), #18 (06.28.22), #19 (9.13.22), #20 (11.29.22), #21  (01.31.23), #22 (04.04.23), #23 (05.30.23), #24 (07.27.23), #25 (09.19.23), #26 (11.07.23), #27 (12.29.23), #28 (02.14.24), #29 (06.04.24), #30 (07.16.24), #31 (08.27.24), #32 (01.21.25), #33 (02.26.25) ============================== - s/p IVE OS #1 (10.01.24), #2 (11.08.24), #3 (12.10.24) **history of recurrent / worsening SRF at 9 wk interval, noted on 04.04.23 visit, at 8 week interval on 09.19.23 and 06.04.24** - OCT shows OS: stable improvement in shallow SRF and low lying PED; patchy central ORA, partial PVD at 6 weeks  - BCVA OS stable at 20/50 - recommend IVA OS #34 today, 04.09.25 w/ f/u in 5 wks -- [employer] funding unavailable  - pt wishes to proceed with IVA injection - RBA of procedure discussed, questions answered  - see procedure notes  - Avastin informed consent obtained, signed and scanned 01.21.25 (OU)  - Eylea approved for 2025 -- but no funding for [employer]  - f/u in 5 wks for DFE, OCT, possible injection OS   2. Exudative age-related macular degeneration, right eye  - conversion to exu ARMD on 06.04.24 exam  - s/p IVA OD #1 (06.04.24), #2 (07.16.24), #3 (08.27.24), #4 (01.21.25), #5 (02.26.25)  ==================================  - s/p IVE OD #1 (10.01.24), #2 (11.08.24), #3 (12.10.24) - OCT shows OD: Stable improvement in focal SRHM/IRHM, shallow central PED - slightly increased at 6 weeks  - recommend IVA OD #6 today, 04.09.25 with follow up in 5 weeks  - pt wishes to proceed with injection  - RBA of procedure discussed, questions answered - IVA informed consent obtained and signed, 01.21.25 (OU) - see procedure note  - f/u in 5 wks, DFE, OCT, possible injxn OD  3,4. Diabetes mellitus, type 2 without retinopathy - The incidence, risk factors for progression, natural history and treatment options for diabetic retinopathy  were discussed with patient.   - The need for close monitoring of blood glucose, blood pressure, and serum lipids, avoiding cigarette or any type  of tobacco, and the need for long term follow up was also discussed with patient.  - f/u in 1 year  5,6. Hypertensive retinopathy OU  - discussed importance of tight BP control  - continue to monitor  7. Pseudophakia OU  - s/p CE/IOL OU  - beautiful surgeries, doing well  - continue to monitor  8. Dry eyes OU  - pt on restasis BID OU - recommend artificial tears and lubricating ointment as needed   Ophthalmic Meds Ordered this visit:  Meds ordered this encounter  Medications   Bevacizumab (AVASTIN) SOLN 1.25 mg   Bevacizumab (AVASTIN) SOLN 1.25 mg    Return in about 5 weeks (around 06/08/2023) for f/u Ex. AMD OU , DFE, OCT, Possible, IVA, OU.  There are no Patient Instructions on file for this visit.   This document serves as a record of services personally performed by Karie Chimera, MD, PhD. It was created on their behalf by  Glee Arvin. Manson Passey, OA an ophthalmic technician. The creation of this record is the provider's dictation and/or activities during the visit.    Electronically signed by: Glee Arvin. Manson Passey, OA 05/04/23 9:56 PM  This document serves as a record of services personally performed by Karie Chimera, MD, PhD. It was created on their behalf by Charlette Caffey, COT an ophthalmic technician. The creation of this record is the provider's dictation and/or activities during the visit.    Electronically signed by:  Charlette Caffey, COT  05/04/23 9:56 PM  Karie Chimera, M.D., Ph.D. Diseases & Surgery of the Retina and Vitreous [facility]  I have reviewed the above documentation for accuracy and completeness, and I agree with the above. Karie Chimera, M.D., Ph.D. 05/04/23 9:58 PM   Abbreviations: M myopia (nearsighted); A astigmatism; H hyperopia (farsighted); P presbyopia; Mrx spectacle prescription;  CTL contact lenses; OD right eye; OS left eye; OU both eyes  XT exotropia; ET esotropia; PEK punctate epithelial keratitis; PEE  punctate epithelial erosions; DES dry eye syndrome; MGD meibomian gland dysfunction; ATs artificial tears; PFAT's preservative free artificial tears; NSC nuclear sclerotic cataract; PSC posterior subcapsular cataract; ERM epi-retinal membrane; PVD posterior vitreous detachment; RD retinal detachment; DM diabetes mellitus; DR diabetic retinopathy; NPDR non-proliferative diabetic retinopathy; PDR proliferative diabetic retinopathy; CSME clinically significant macular edema; DME diabetic macular edema; dbh dot blot hemorrhages; CWS cotton wool spot; POAG primary open angle glaucoma; C/D cup-to-disc ratio; HVF humphrey visual field; GVF goldmann visual field; OCT optical coherence tomography; IOP intraocular pressure; BRVO Branch retinal vein occlusion; CRVO central retinal vein occlusion; CRAO central retinal artery occlusion; BRAO branch retinal artery occlusion; RT retinal tear; SB scleral buckle; PPV pars plana vitrectomy; VH Vitreous hemorrhage; PRP panretinal laser photocoagulation; IVK intravitreal kenalog; VMT vitreomacular traction; MH Macular hole;  NVD neovascularization of the disc; NVE neovascularization elsewhere; AREDS age related eye disease study; ARMD age related macular degeneration; POAG primary open angle glaucoma; EBMD epithelial/anterior basement membrane dystrophy; ACIOL anterior chamber intraocular lens; IOL intraocular lens; PCIOL posterior chamber intraocular lens; Phaco/IOL phacoemulsification with intraocular lens placement; PRK photorefractive keratectomy; LASIK laser assisted in situ keratomileusis; HTN hypertension; DM diabetes mellitus; COPD chronic obstructive pulmonary disease

## 2023-05-03 ENCOUNTER — Encounter (INDEPENDENT_AMBULATORY_CARE_PROVIDER_SITE_OTHER): Payer: Medicare Other | Admitting: Ophthalmology

## 2023-05-04 ENCOUNTER — Ambulatory Visit (INDEPENDENT_AMBULATORY_CARE_PROVIDER_SITE_OTHER): Admitting: Ophthalmology

## 2023-05-04 ENCOUNTER — Encounter (INDEPENDENT_AMBULATORY_CARE_PROVIDER_SITE_OTHER): Payer: Self-pay | Admitting: Ophthalmology

## 2023-05-04 DIAGNOSIS — H35033 Hypertensive retinopathy, bilateral: Secondary | ICD-10-CM | POA: Diagnosis not present

## 2023-05-04 DIAGNOSIS — Z961 Presence of intraocular lens: Secondary | ICD-10-CM

## 2023-05-04 DIAGNOSIS — E119 Type 2 diabetes mellitus without complications: Secondary | ICD-10-CM | POA: Diagnosis not present

## 2023-05-04 DIAGNOSIS — H353221 Exudative age-related macular degeneration, left eye, with active choroidal neovascularization: Secondary | ICD-10-CM

## 2023-05-04 DIAGNOSIS — H353231 Exudative age-related macular degeneration, bilateral, with active choroidal neovascularization: Secondary | ICD-10-CM

## 2023-05-04 DIAGNOSIS — H353211 Exudative age-related macular degeneration, right eye, with active choroidal neovascularization: Secondary | ICD-10-CM

## 2023-05-04 DIAGNOSIS — I10 Essential (primary) hypertension: Secondary | ICD-10-CM

## 2023-05-04 DIAGNOSIS — H04123 Dry eye syndrome of bilateral lacrimal glands: Secondary | ICD-10-CM

## 2023-05-04 DIAGNOSIS — Z7984 Long term (current) use of oral hypoglycemic drugs: Secondary | ICD-10-CM

## 2023-05-04 MED ORDER — BEVACIZUMAB CHEMO INJECTION 1.25MG/0.05ML SYRINGE FOR KALEIDOSCOPE
1.2500 mg | INTRAVITREAL | Status: AC | PRN
  Administered 2023-05-04: 1.25 mg via INTRAVITREAL

## 2023-05-10 DIAGNOSIS — N1832 Chronic kidney disease, stage 3b: Secondary | ICD-10-CM | POA: Diagnosis not present

## 2023-05-17 DIAGNOSIS — R0989 Other specified symptoms and signs involving the circulatory and respiratory systems: Secondary | ICD-10-CM | POA: Diagnosis not present

## 2023-05-17 DIAGNOSIS — E782 Mixed hyperlipidemia: Secondary | ICD-10-CM | POA: Diagnosis not present

## 2023-05-17 DIAGNOSIS — E1165 Type 2 diabetes mellitus with hyperglycemia: Secondary | ICD-10-CM | POA: Diagnosis not present

## 2023-05-17 DIAGNOSIS — N1832 Chronic kidney disease, stage 3b: Secondary | ICD-10-CM | POA: Diagnosis not present

## 2023-05-17 DIAGNOSIS — I129 Hypertensive chronic kidney disease with stage 1 through stage 4 chronic kidney disease, or unspecified chronic kidney disease: Secondary | ICD-10-CM | POA: Diagnosis not present

## 2023-05-25 ENCOUNTER — Other Ambulatory Visit: Payer: Self-pay | Admitting: Podiatry

## 2023-05-25 DIAGNOSIS — E119 Type 2 diabetes mellitus without complications: Secondary | ICD-10-CM

## 2023-05-25 DIAGNOSIS — M2041 Other hammer toe(s) (acquired), right foot: Secondary | ICD-10-CM

## 2023-05-25 DIAGNOSIS — E11621 Type 2 diabetes mellitus with foot ulcer: Secondary | ICD-10-CM

## 2023-05-25 DIAGNOSIS — E1142 Type 2 diabetes mellitus with diabetic polyneuropathy: Secondary | ICD-10-CM

## 2023-05-25 DIAGNOSIS — Z89421 Acquired absence of other right toe(s): Secondary | ICD-10-CM

## 2023-05-25 NOTE — Progress Notes (Signed)
 [facility] - Clinic Note  06/07/2023   CHIEF COMPLAINT Patient presents for Retina Follow Up  HISTORY OF PRESENT ILLNESS: Sue [name] is a 89 y.o. female who presents to the clinic today for:  HPI     Retina Follow Up   Patient presents with  Wet AMD.  This started 5 weeks ago.  Duration of 5 weeks.  Since onset it is stable.  I, the attending physician,  performed the HPI with the patient and updated documentation appropriately.        Comments   5 week retina AMD and IVA OU and IVA OU pt is reporting no vision changes noticed she denies any flashes or floaters       Last edited by Ronelle Coffee, MD on 06/07/2023  8:50 PM.       Referring physician: Virgle Grime, MD [address] [place],  [place] [address]  HISTORICAL INFORMATION:   Selected notes from the MEDICAL RECORD NUMBER Referred by Dr. Donovan Gallant for concern of ARMD OU   CURRENT MEDICATIONS: Current Outpatient Medications (Ophthalmic Drugs)  Medication Sig   Polyethyl Glycol-Propyl Glycol (SYSTANE) 0.4-0.3 % SOLN Place 1 drop into both eyes 3 (three) times daily as needed (dry eyes). (Patient not taking: Reported on 06/07/2023)   RESTASIS  0.05 % ophthalmic emulsion Place 1 drop into both eyes 2 (two) times daily.   No current facility-administered medications for this visit. (Ophthalmic Drugs)   Current Outpatient Medications (Other)  Medication Sig   amLODipine  (NORVASC ) 10 MG tablet Take 1 tablet by mouth daily.   B-D ULTRAFINE III SHORT PEN 31G X 8 MM MISC USE AS DIRECTED ONCE DAILY SUBCUTANEOUSLY   Calcium  Citrate-Vitamin D  (CALCIUM  + D PO)    calcium -vitamin D  (OSCAL WITH D) 500-200 MG-UNIT tablet Take 1 tablet by mouth 2 (two) times daily.    Continuous Blood Gluc Sensor (FREESTYLE LIBRE 2 SENSOR) MISC See admin instructions.   Cyanocobalamin  (B-12 PO)    diphenhydramine-acetaminophen  (TYLENOL  PM) 25-500 MG TABS tablet Take 1 tablet by mouth at bedtime as  needed (sleep).   gentamicin  cream (GARAMYCIN ) 0.1 % APPLY TO ULCER RIGHT FOOT ONCE DAILY.   Glucagon (GVOKE HYPOPEN 2-PACK) 0.5 MG/0.1ML SOAJ as needed for hypoglycemia   glucose blood test strip OneTouch Ultra Test strips  TEST ONCE A DAY ONCE A DAY FINGERSTICK 90   hydrALAZINE  (APRESOLINE ) 25 MG tablet Take 3 tablets (75 mg total) by mouth every 8 (eight) hours.   hydrochlorothiazide (HYDRODIURIL) 25 MG tablet Take 25 mg by mouth daily.   ibuprofen (ADVIL) 200 MG tablet Take 400 mg by mouth every 6 (six) hours as needed for moderate pain.   Insulin  Degludec (TRESIBA  FLEXTOUCH) 200 UNIT/ML SOPN Inject 36 Units into the skin at bedtime. (Patient taking differently: Inject 22 Units into the skin at bedtime.)   Insulin  Pen Needle (PEN NEEDLES) 32G X 4 MM MISC BD Ultra-Fine Nano Pen Needle 32 gauge x 5/32"  USE AS DIRECTED DAILY   ketoconazole (NIZORAL) 2 % cream Apply topically.   Lancets (ONETOUCH ULTRASOFT) lancets OneTouch UltraSoft Lancets  USE TWICE DAILY   Melatonin 10 MG CAPS Take 10 mg by mouth at bedtime as needed (sleep).   meloxicam  (MOBIC ) 7.5 MG tablet Take 1 tablet (7.5 mg total) by mouth daily as needed for pain.   Multiple Vitamins-Minerals (ADULT ONE DAILY GUMMIES) CHEW Chew 1 tablet by mouth 2 (two) times daily. Centrum   Multiple Vitamins-Minerals (AIRBORNE) TBEF Take  1 tablet by mouth daily as needed (immune support).   Multiple Vitamins-Minerals (CENTRUM SILVER  PO)    Multiple Vitamins-Minerals (PRESERVISION AREDS 2) CAPS Take 1 capsule by mouth 2 (two) times daily.   mupirocin  ointment (BACTROBAN ) 2 % Apply to right foot ulcer once daily. (Patient taking differently: Apply 1 application  topically daily as needed (wound care). Apply to right foot ulcer once daily.)   nortriptyline  (PAMELOR ) 25 MG capsule Take 1 capsule (25 mg total) by mouth at bedtime. (Patient taking differently: Take 25 mg by mouth 2 (two) times daily.)   Omega-3 Fatty Acids (FISH OIL) 1200 MG CAPS Take  1,200 mg by mouth daily with lunch.    omeprazole (PRILOSEC) 20 MG capsule Take 1 capsule by mouth daily.   ondansetron  (ZOFRAN ) 4 MG tablet Take 4 mg by mouth 3 (three) times daily as needed.   ONE TOUCH ULTRA TEST test strip    PRESCRIPTION MEDICATION by Intravitreal route every 30 (thirty) days. Ophthalmic injection at MD office in left eye   silver  sulfADIAZINE  (SILVADENE ) 1 % cream Apply pea-sized amount to wound daily. (Patient taking differently: Apply 1 application  topically daily. Apply pea-sized amount to wound daily.)   simvastatin  (ZOCOR ) 20 MG tablet Take 1 tablet by mouth daily.   traMADol  (ULTRAM ) 50 MG tablet Take 1 tablet (50 mg total) by mouth 2 (two) times daily as needed.   vitamin B-12 (CYANOCOBALAMIN ) 1000 MCG tablet Take 1,000 mcg by mouth 2 (two) times daily.    Current Facility-Administered Medications (Other)  Medication Route   Bevacizumab  (AVASTIN ) SOLN 1.25 mg Intravitreal   Bevacizumab  (AVASTIN ) SOLN 1.25 mg Intravitreal   REVIEW OF SYSTEMS: ROS   Positive for: Neurological, Skin, Genitourinary, Musculoskeletal, Endocrine, Eyes Negative for: Constitutional, Gastrointestinal, HENT, Cardiovascular, Respiratory, Psychiatric, Allergic/Imm, Heme/Lymph Last edited by Alise Appl, COT on 06/07/2023  9:45 AM.      ALLERGIES No Known Allergies  PAST MEDICAL HISTORY Past Medical History:  Diagnosis Date   Anemia    Arthritis    Basal cell carcinoma 11/23/2017   left elbow, right neck TX CX3 5FU    Basal cell carcinoma 04/04/2019   infil-right sideburn (CX35FU)   Chronic kidney disease    stage 3 ckd stage 3 b per dr Gloria Lares 09-26-2019 note on chart   Complication of anesthesia    deifficulty waking up   COVID-19    Diabetic neuropathy (HCC)    Hyperlipidemia    Hypertension    Hypertensive retinopathy    OU   Infiltrative basal cell carcinoma (BCC) 04/22/2020   Right Zygomatic Area    Macular degeneration    OU   Nodular basal cell  carcinoma (BCC) 04/22/2020   Right Anterior Neck   Pneumonia    Right carotid bruit per dr Gloria Lares 09-26-2019 note on chart   SCC (squamous cell carcinoma) 11/23/2017   right forehead TX CX3 5FU   SCCA (squamous cell carcinoma) of skin 04/22/2020   Mid Parietal Scalp (in situ)   SCCA (squamous cell carcinoma) of skin 04/22/2020   Right Dorsal Hand (in situ)   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 No pertinent family history.  SOCIAL HISTORY Social History   Tobacco Use   Smoking status: Former   Smokeless tobacco: Never  Advertising account planner   Vaping status: Never Used  Substance Use Topics   Alcohol  use: No    Alcohol /week: 0.0 standard drinks of alcohol    Drug use: No       OPHTHALMIC EXAM:  Base Eye Exam     Visual Acuity (Snellen - Linear)       Right Left   Dist cc 20/60 -2 20/60 -1   Dist ph [place] NI NI         Tonometry (Tonopen, 9:53 AM)       Right Left   Pressure 17 16         Pupils       Pupils Dark Light Shape React APD   Right PERRL 4 3 Round Brisk None   Left PERRL 4 3 Round Brisk None         Visual Fields       Left Right    Full Full         Extraocular Movement       Right Left    Full, Ortho Full, Ortho         Neuro/Psych     Oriented x3: Yes   Mood/Affect: Normal         Dilation     Both eyes: 2.5% Phenylephrine @ 9:53 AM           Slit Lamp and Fundus Exam     Slit Lamp Exam       Right Left   Lids/Lashes Dermatochalasis - upper lid, Telangiectasia, mild MGD  Dermatochalasis - upper lid, Telangiectasia, Meibomian gland dysfunction   Conjunctiva/Sclera White and quiet White and quiet   Cornea Temporal Well healed cataract wounds, mild Arcus, trace endo pigment nasally, 1+PEE Arcus, Temporal Well healed cataract wounds, 1-2+ PEE, tear film debris   Anterior Chamber Deep and quiet Deep and quiet   Iris Round and moderately dilated to 4.28mm Round and moderately dilated to 5mm, Iris atrophy from 0100 to 0230, peripheral iridotomy at 0200   Lens 3-piece PC IOL in good position PC IOL in good position   Anterior Vitreous Vitreous syneresis, Posterior vitreous detachment, Weiss ring, vitreous condensations Vitreous syneresis, PVD, vitreous condensations         Fundus Exam       Right Left   Disc 360 Peripapillary atrophy, diffuse mild pallor, compact, sharp rim mild pallor, Peripapillary atrophy and pigmentation   C/D Ratio 0.1 0.2   Macula Blunted foveal reflex, +PED / CNV -- slightly improved, RPE mottling, clumping and early atrophy, Drusen, No heme Blunted foveal reflex, low lying PED / +CNVM with stable improvement in IRF and SRF, RPE mottling and clumping, Drusen, No heme, early atrophy   Vessels Attenuated, Tortuous Attenuated, mild tortuosity   Periphery Attached, 360 Reticular degeneration, No heme Attached, 360 Reticular degeneration, No heme           IMAGING AND PROCEDURES  Imaging and Procedures for @TODAY @  OCT, Retina - OU - Both Eyes        Right Eye Quality was good. Central Foveal Thickness: 220. Progression has improved. Findings include no IRF, no SRF, abnormal foveal contour, retinal drusen , subretinal hyper-reflective material,  intraretinal hyper-reflective material, pigment epithelial detachment, outer retinal atrophy (Stable improvement in focal SRHM/IRHM; shallow central PED - slightly improved).   Left Eye Quality was good. Central Foveal Thickness: 238. Progression has been stable. Findings include normal foveal  contour, no IRF, no SRF, retinal drusen , epiretinal membrane, pigment epithelial detachment, outer retinal atrophy (Stable improvement in shallow SRF and low lying PED; patchy central ORA, partial PVD).   Notes  *Images captured and stored on drive  Diagnosis / Impression:  OD: Stable improvement in focal SRHM/IRHM; shallow central PED - slightly improved OS: Exudative ARMD -- stable improvement in shallow SRF and low lying PED; patchy central ORA, partial PVD  Clinical management:  See below  Abbreviations: NFP - Normal foveal profile. CME - cystoid macular edema. PED - pigment epithelial detachment. IRF - intraretinal fluid. SRF - subretinal fluid. EZ - ellipsoid zone. ERM - epiretinal membrane. ORA - outer retinal atrophy. ORT - outer retinal tubulation. SRHM - subretinal hyper-reflective material       Intravitreal Injection, Pharmacologic Agent - OD - Right Eye       Time Out 06/07/2023. 10:53 AM. Confirmed correct patient, procedure, site, and patient consented.   Anesthesia Topical anesthesia was used. Anesthetic medications included Lidocaine  2%, Proparacaine 0.5%.   Procedure Preparation included 5% betadine to ocular surface, eyelid speculum. A (32g) needle was used.   Injection: 1.25 mg Bevacizumab  1.25mg /0.29ml   Route: Intravitreal, Site: Right Eye   NDC: H525437, Lot: [id number], Expiration date: 08/04/2023   Post-op Post injection exam found visual acuity of at least counting fingers. The patient tolerated the procedure well. There were no complications. The patient received written and verbal post procedure care education.      Intravitreal Injection, Pharmacologic Agent - OS - Left Eye       Time Out 06/07/2023. 10:54 AM. Confirmed correct patient, procedure, site, and patient consented.   Anesthesia Topical anesthesia was used. Anesthetic medications included Lidocaine  2%, Proparacaine 0.5%, Akten 3.5%.   Procedure Preparation included 5% betadine  to ocular surface, eyelid speculum. A (32g) needle was used.   Injection: 1.25 mg Bevacizumab  1.25mg /0.66ml   Route: Intravitreal, Site: Left Eye   NDC: H525437, Lot: [id number], Expiration date: 08/30/2023   Post-op Post injection exam found visual acuity of at least counting fingers. The patient tolerated the procedure well. There were no complications. The patient received written and verbal post procedure care education. Post injection medications were not given.            ASSESSMENT/PLAN:    ICD-10-CM   1. Exudative age-related macular degeneration of left eye with active choroidal neovascularization (HCC)  H35.3221 OCT, Retina - OU - Both Eyes    Intravitreal Injection, Pharmacologic Agent - OS - Left Eye    Bevacizumab  (AVASTIN ) SOLN 1.25 mg    2. Exudative age-related macular degeneration of right eye with active choroidal neovascularization (HCC)  H35.3211 Intravitreal Injection, Pharmacologic Agent - OD - Right Eye    Bevacizumab  (AVASTIN ) SOLN 1.25 mg    3. Diabetes mellitus type 2 without retinopathy (HCC)  E11.9     4. Long term (current) use of oral hypoglycemic drugs  Z79.84     5. Essential hypertension  I10     6. Hypertensive retinopathy of both eyes  H35.033     7. Pseudophakia of both eyes  Z96.1     8. Dry eyes  H04.123       1. Exudative age-related macular degeneration, left eye - conversion  of OS from nonexudative to exudative ARMD prior to 02.05.20 visit - s/p IVA OS #1 (02.05.20), #2 (03.04.20), #3 (04.30.20), #4 (06.02.20), #5 (07.14.20), #6 (9.1.20), #7 (10.27.20), #8 (12.23.20), #9 (02.03.21), #10 (03.17.21), #11 (04.28.21), #12 (06.09.21), #13 (08.04.21), #14 (09.29.21), #15 (11.22.2021), #16 (01.26.22), #17 (04.06.22), #18 (06.28.22), #19 (9.13.22), #20 (11.29.22), #21 (01.31.23), #22 (04.04.23), #23 (05.30.23), #24 (07.27.23), #25 (09.19.23), #26 (11.07.23), #27 (12.29.23), #28 (02.14.24), #29 (06.04.24), #30 (07.16.24), #31 (08.27.24), #32  (01.21.25), #33 (02.26.25), #34 (04.09.25) ============================== - s/p IVE OS #1 (10.01.24), #2 (11.08.24), #3 (12.10.24) **history of recurrent / worsening SRF at 9 wk interval, noted on 04.04.23 visit, at 8 week interval on 09.19.23 and 06.04.24** - OCT shows OS: stable improvement in shallow SRF and low lying PED; patchy central ORA, partial PVD at 5 weeks  - BCVA OS decreased to 20/60 from 20/50 - recommend IVA OS #35 today, 05.13.25 w/ f/u in 5 wks -- [employer] funding unavailable  - pt wishes to proceed with IVA injection - RBA of procedure discussed, questions answered  - see procedure notes  - Avastin  informed consent obtained, signed and scanned 01.21.25 (OU)  - Eylea  approved for 2025 -- but no funding for [employer]  - f/u in 5 wks for DFE, OCT, possible injection OS   2. Exudative age-related macular degeneration, right eye  - conversion to exu ARMD on 06.04.24 exam  - s/p IVA OD #1 (06.04.24), #2 (07.16.24), #3 (08.27.24), #4 (01.21.25), #5 (02.26.25), #6 (04.09.25)  ==================================  - s/p IVE OD #1 (10.01.24), #2 (11.08.24), #3 (12.10.24) - OCT shows OD: Stable improvement in focal SRHM/IRHM, shallow central PED - slightly improved at 5 weeks  - recommend IVA OD #7 today, 05.13.25 with follow up in 5 weeks  - pt wishes to proceed with injection  - RBA of procedure discussed, questions answered - IVA informed consent obtained and signed, 01.21.25 (OU) - see procedure note  - f/u in 5 wks, DFE, OCT, possible injxn OD  3,4. Diabetes mellitus, type 2 without retinopathy - The incidence, risk factors for progression, natural history and treatment options for diabetic retinopathy  were discussed with patient.   - The need for close monitoring of blood glucose, blood pressure, and serum lipids, avoiding cigarette or any type of tobacco, and the need for long term follow up was also discussed with patient.  - f/u in 1 year  5,6. Hypertensive  retinopathy OU  - discussed importance of tight BP control  - continue to monitor  7. Pseudophakia OU  - s/p CE/IOL OU  - beautiful surgeries, doing well  - continue to monitor  8. Dry eyes OU  - pt on restasis  BID OU - recommend artificial tears and lubricating ointment as needed   Ophthalmic Meds Ordered this visit:  Meds ordered this encounter  Medications   Bevacizumab  (AVASTIN ) SOLN 1.25 mg   Bevacizumab  (AVASTIN ) SOLN 1.25 mg    Return in about 5 weeks (around 07/12/2023) for f/u exu ARMD OU, DFE, OCT, Possible Injxn.  There are no Patient Instructions on file for this visit.   This document serves as a record of services personally performed by Jeanice Millard, MD, PhD. It was created on their behalf by Olene Berne, COT an ophthalmic technician. The creation of this record is the provider's dictation and/or activities during the visit.    Electronically signed by:  Olene Berne, COT  06/10/23 2:13 AM  This document serves as a record of services personally  performed by Jeanice Millard, MD, PhD. It was created on their behalf by Morley Arabia. Bevin Bucks, OA an ophthalmic technician. The creation of this record is the provider's dictation and/or activities during the visit.    Electronically signed by: Morley Arabia. Bevin Bucks, OA 06/10/23 2:13 AM   Jeanice Millard, M.D., Ph.D. Diseases & Surgery of the Retina and Vitreous [facility]  I have reviewed the above documentation for accuracy and completeness, and I agree with the above. Jeanice Millard, M.D., Ph.D. 06/10/23 2:16 AM   Abbreviations: M myopia (nearsighted); A astigmatism; H hyperopia (farsighted); P presbyopia; Mrx spectacle prescription;  CTL contact lenses; OD right eye; OS left eye; OU both eyes  XT exotropia; ET esotropia; PEK punctate epithelial keratitis; PEE punctate epithelial erosions; DES dry eye syndrome; MGD meibomian gland dysfunction; ATs artificial tears; PFAT's preservative  free artificial tears; NSC nuclear sclerotic cataract; PSC posterior subcapsular cataract; ERM epi-retinal membrane; PVD posterior vitreous detachment; RD retinal detachment; DM diabetes mellitus; DR diabetic retinopathy; NPDR non-proliferative diabetic retinopathy; PDR proliferative diabetic retinopathy; CSME clinically significant macular edema; DME diabetic macular edema; dbh dot blot hemorrhages; CWS cotton wool spot; POAG primary open angle glaucoma; C/D cup-to-disc ratio; HVF humphrey visual field; GVF goldmann visual field; OCT optical coherence tomography; IOP intraocular pressure; BRVO Branch retinal vein occlusion; CRVO central retinal vein occlusion; CRAO central retinal artery occlusion; BRAO branch retinal artery occlusion; RT retinal tear; SB scleral buckle; PPV pars plana vitrectomy; VH Vitreous hemorrhage; PRP panretinal laser photocoagulation; IVK intravitreal kenalog; VMT vitreomacular traction; MH Macular hole;  NVD neovascularization of the disc; NVE neovascularization elsewhere; AREDS age related eye disease study; ARMD age related macular degeneration; POAG primary open angle glaucoma; EBMD epithelial/anterior basement membrane dystrophy; ACIOL anterior chamber intraocular lens; IOL intraocular lens; PCIOL posterior chamber intraocular lens; Phaco/IOL phacoemulsification with intraocular lens placement; PRK photorefractive keratectomy; LASIK laser assisted in situ keratomileusis; HTN hypertension; DM diabetes mellitus; COPD chronic obstructive pulmonary disease

## 2023-05-31 DIAGNOSIS — N1832 Chronic kidney disease, stage 3b: Secondary | ICD-10-CM | POA: Diagnosis not present

## 2023-05-31 DIAGNOSIS — M25561 Pain in right knee: Secondary | ICD-10-CM | POA: Diagnosis not present

## 2023-05-31 DIAGNOSIS — E1165 Type 2 diabetes mellitus with hyperglycemia: Secondary | ICD-10-CM | POA: Diagnosis not present

## 2023-05-31 DIAGNOSIS — S838X1A Sprain of other specified parts of right knee, initial encounter: Secondary | ICD-10-CM | POA: Diagnosis not present

## 2023-05-31 DIAGNOSIS — I129 Hypertensive chronic kidney disease with stage 1 through stage 4 chronic kidney disease, or unspecified chronic kidney disease: Secondary | ICD-10-CM | POA: Diagnosis not present

## 2023-06-07 ENCOUNTER — Ambulatory Visit (INDEPENDENT_AMBULATORY_CARE_PROVIDER_SITE_OTHER): Admitting: Ophthalmology

## 2023-06-07 ENCOUNTER — Encounter (INDEPENDENT_AMBULATORY_CARE_PROVIDER_SITE_OTHER): Payer: Self-pay | Admitting: Ophthalmology

## 2023-06-07 ENCOUNTER — Encounter: Payer: Self-pay | Admitting: Podiatry

## 2023-06-07 ENCOUNTER — Ambulatory Visit: Payer: Medicare Other | Admitting: Podiatry

## 2023-06-07 VITALS — Ht 66.0 in | Wt 170.0 lb

## 2023-06-07 DIAGNOSIS — B351 Tinea unguium: Secondary | ICD-10-CM | POA: Diagnosis not present

## 2023-06-07 DIAGNOSIS — Z7984 Long term (current) use of oral hypoglycemic drugs: Secondary | ICD-10-CM | POA: Diagnosis not present

## 2023-06-07 DIAGNOSIS — L97411 Non-pressure chronic ulcer of right heel and midfoot limited to breakdown of skin: Secondary | ICD-10-CM | POA: Diagnosis not present

## 2023-06-07 DIAGNOSIS — E1142 Type 2 diabetes mellitus with diabetic polyneuropathy: Secondary | ICD-10-CM | POA: Diagnosis not present

## 2023-06-07 DIAGNOSIS — H353231 Exudative age-related macular degeneration, bilateral, with active choroidal neovascularization: Secondary | ICD-10-CM | POA: Diagnosis not present

## 2023-06-07 DIAGNOSIS — H35033 Hypertensive retinopathy, bilateral: Secondary | ICD-10-CM

## 2023-06-07 DIAGNOSIS — I10 Essential (primary) hypertension: Secondary | ICD-10-CM

## 2023-06-07 DIAGNOSIS — M79675 Pain in left toe(s): Secondary | ICD-10-CM | POA: Diagnosis not present

## 2023-06-07 DIAGNOSIS — H353221 Exudative age-related macular degeneration, left eye, with active choroidal neovascularization: Secondary | ICD-10-CM

## 2023-06-07 DIAGNOSIS — E11621 Type 2 diabetes mellitus with foot ulcer: Secondary | ICD-10-CM | POA: Diagnosis not present

## 2023-06-07 DIAGNOSIS — Z89421 Acquired absence of other right toe(s): Secondary | ICD-10-CM | POA: Diagnosis not present

## 2023-06-07 DIAGNOSIS — H353211 Exudative age-related macular degeneration, right eye, with active choroidal neovascularization: Secondary | ICD-10-CM

## 2023-06-07 DIAGNOSIS — M79674 Pain in right toe(s): Secondary | ICD-10-CM

## 2023-06-07 DIAGNOSIS — E119 Type 2 diabetes mellitus without complications: Secondary | ICD-10-CM

## 2023-06-07 DIAGNOSIS — H04123 Dry eye syndrome of bilateral lacrimal glands: Secondary | ICD-10-CM

## 2023-06-07 DIAGNOSIS — Z961 Presence of intraocular lens: Secondary | ICD-10-CM

## 2023-06-07 MED ORDER — BEVACIZUMAB CHEMO INJECTION 1.25MG/0.05ML SYRINGE FOR KALEIDOSCOPE
1.2500 mg | INTRAVITREAL | Status: AC | PRN
  Administered 2023-06-07: 1.25 mg via INTRAVITREAL

## 2023-06-12 NOTE — Progress Notes (Signed)
 Subjective:  Patient ID: Sue [name], female    DOB: 05/06/1932,  MRN: [id number]  Sue [name] presents to clinic today for at risk foot care with history of diabetic neuropathy and painful porokeratotic lesion(s) right foot and painful mycotic toenails that limit ambulation. Painful toenails interfere with ambulation. Aggravating factors include wearing enclosed shoe gear. Pain is relieved with periodic professional debridement. Painful porokeratotic lesions are aggravated when weightbearing with and without shoegear. Pain is relieved with periodic professional debridement.   Patient states her right foot callus area is sore today. Denies any redness, drainage or swelling. Chief Complaint  Patient presents with   Nail Problem    Pt is here for [facility] last A1C was under 7 PCP is Dr Gloria Lares and LOV was in June.   PCP is Virgle Grime, MD.  No Known Allergies  Review of Systems: Negative except as noted in the HPI.  Objective: No changes noted in today's physical examination. There were no vitals filed for this visit.  AL GAGEN is a pleasant 89 y.o. female WD, WN in NAD. AAO x 3.  Vascular Examination: Capillary refill time immediate b/l. Vascular status intact b/l with palpable pedal pulses. Pedal hair present b/l. No pain with calf compression b/l. Skin temperature gradient WNL b/l. No cyanosis or clubbing b/l. No ischemia or gangrene noted b/l.   Neurological Examination: Pt has subjective symptoms of neuropathy. Protective sensation diminished with 10g monofilament b/l.  Dermatological Examination: Pedal skin with normal turgor, texture and tone b/l.  No open wounds. No interdigital macerations.   Toenails 1-5 b/l thick, discolored, elongated with subungual debris and pain on dorsal palpation.      Wound Location: submet head 2 right foot There is a minimal amount of devitalized tissue present in the wound. Predebridement Wound Measurement:  0.4  x 0.2 x  0 cm with hemorrhagic hyperkeratotic roof. Postdebridement Wound Measurement: 0.7 x 0.7 x 0.1 cm partial thickness Wound Base: fibrotic Peri-wound: Normal Exudate: None: wound tissue dry Blood Loss during debridement: 0 cc('s). Sign(s) of clinical bacterial infection: no clinical signs of infection noted on examination today.   Musculoskeletal Examination: Muscle strength 5/5 to all lower extremity muscle groups bilaterally. 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 Utilizes rollator for ambulation assistance.  Radiographs: None  Assessment/Plan: 1. Pain due to onychomycosis of toenails of both feet   2. Diabetic ulcer of right midfoot associated with type 2 diabetes mellitus, limited to breakdown of skin (HCC)   3. History of partial amputation of toe of right foot (HCC)   4. Diabetic peripheral neuropathy associated with type 2 diabetes mellitus (HCC)   Plan: -Patient was evaluated and treated and all questions answered.  -Patient/POA/Family member educated on diagnosis and treatment plan of routine ulcer debridement/wound care.  -Ulceration debridement achieved utilizing sharp excisional debridement with sterile scalpel blade and sterile currette.. Type/amount of devitalized tissue removed: nonviable hyperkeratosis. -Today's ulcer size post-debridement: 0.7 x 0.7 x 0.1 cm. -Ulceration cleansed with wound cleanser. Triple antibiotic ointment applied to base of ulceration and secured with light dressing. -Wound responded well to today's debridement. -Discussed daily wound care consisting of cleaning with dial  antibacterial soap. Dab dry with sterile gauze. Apply a light amount of Gentamicin  Cream and  cover with light dressing. -Offloaded callus(es)/porokeratotic lesion(s) submet head 2 right foot with felt dancers pad applied to insole(s) of shoe. -Patient/POA to call should there be question/concern in the interim. -Toenails 1-5 debrided in length and girth  without incident. Continue soft, supportive shoe gear daily. Report any pedal injuries to medical professional. Call office if there are any questions/concerns.  Return in about 3 months (around 09/07/2023).  Luella Sager, DPM      [place] LOCATION: [address], [place] [address]                   Office [phone]   [place] LOCATION: [address] [place], [place] [address] Office [phone]  -Patient/POA to call should there be question/concern in the interim.   Return in about 3 months (around 09/07/2023).  Luella Sager, DPM       LOCATION: [address], [place] [address]                   Office [phone]   [facility] LOCATION: [address] [place], [place] [address] Office [phone]

## 2023-06-13 ENCOUNTER — Encounter: Payer: Self-pay | Admitting: Podiatry

## 2023-06-13 ENCOUNTER — Ambulatory Visit (INDEPENDENT_AMBULATORY_CARE_PROVIDER_SITE_OTHER): Admitting: Podiatry

## 2023-06-13 DIAGNOSIS — E1142 Type 2 diabetes mellitus with diabetic polyneuropathy: Secondary | ICD-10-CM

## 2023-06-13 DIAGNOSIS — Z89421 Acquired absence of other right toe(s): Secondary | ICD-10-CM

## 2023-06-13 DIAGNOSIS — E11621 Type 2 diabetes mellitus with foot ulcer: Secondary | ICD-10-CM

## 2023-06-13 DIAGNOSIS — L97411 Non-pressure chronic ulcer of right heel and midfoot limited to breakdown of skin: Secondary | ICD-10-CM | POA: Diagnosis not present

## 2023-06-18 NOTE — Progress Notes (Unsigned)
  Subjective:  Patient ID: Sue [name], female    DOB: [birth year],  MRN: [id number]  Sue [name] presents to clinic today for follow up of ulceration to the {{jgPodToeLocator:[id number]}  Chief Complaint  Patient presents with   Diabetic Ulcer    Daughter  stated, "It's doing okay.  We been changing the dressing every day."   New problem(s): None.   PCP is Virgle Grime, MD.  No Known Allergies  Review of Systems: Negative except as noted in the HPI.  Objective: No changes noted in today's physical examination. There were no vitals filed for this visit. Sue [name] is a pleasant 89 y.o. female {jgbodyhabitus:[id number]} AAO x 3.  Assessment/Plan: 1. Diabetic ulcer of right midfoot associated with type 2 diabetes mellitus, limited to breakdown of skin (HCC)   2. 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 Diabetic peripheral neuropathy associated with type 2 diabetes mellitus (HCC)     No orders of the defined types were placed in this encounter.   None {Jgplan:[id number]::"-Patient/POA to call should there be question/concern in the interim."}   No follow-ups on file.  Sue [name], DPM      [facility] LOCATION: [address], [place] [address]                   Office [phone]   [facility] LOCATION: [address] [place], [place] [address] Office [phone]

## 2023-06-19 ENCOUNTER — Encounter: Payer: Self-pay | Admitting: Podiatry

## 2023-07-05 NOTE — Progress Notes (Signed)
 [facility] - Clinic Note  07/12/2023   CHIEF COMPLAINT Patient presents for Retina Follow Up  HISTORY OF PRESENT ILLNESS: Sue [name] is a 89 y.o. female who presents to the clinic today for:  HPI     Retina Follow Up   Patient presents with  Wet AMD.  In both eyes.  This started 5 weeks ago.  Duration of 5 weeks.  Since onset it is stable.  I, the attending physician,  performed the HPI with the patient and updated documentation appropriately.        Comments   5 week retina follow up ARMD OS and IVA OS pt is reporting no vision changes noticed she denies any flashes or floaters       Last edited by Valdemar Rogue, MD on 07/12/2023 11:07 PM.     Referring physician: Verdia Lombard, MD [address] [place],  [place] [address]  HISTORICAL INFORMATION:   Selected notes from the MEDICAL RECORD NUMBER Referred by Dr. Medford Ferrier for concern of ARMD OU   CURRENT MEDICATIONS: Current Outpatient Medications (Ophthalmic Drugs)  Medication Sig   Polyethyl Glycol-Propyl Glycol (SYSTANE) 0.4-0.3 % SOLN Place 1 drop into both eyes 3 (three) times daily as needed (dry eyes).   RESTASIS  0.05 % ophthalmic emulsion Place 1 drop into both eyes 2 (two) times daily.   No current facility-administered medications for this visit. (Ophthalmic Drugs)   Current Outpatient Medications (Other)  Medication Sig   amLODipine  (NORVASC ) 10 MG tablet Take 1 tablet by mouth daily.   B-D ULTRAFINE III SHORT PEN 31G X 8 MM MISC USE AS DIRECTED ONCE DAILY SUBCUTANEOUSLY   Calcium  Citrate-Vitamin D  (CALCIUM  + D PO)    calcium -vitamin D  (OSCAL WITH D) 500-200 MG-UNIT tablet Take 1 tablet by mouth 2 (two) times daily.    Continuous Blood Gluc Sensor (FREESTYLE LIBRE 2 SENSOR) MISC See admin instructions.   Cyanocobalamin  (B-12 PO)    diphenhydramine-acetaminophen  (TYLENOL  PM) 25-500 MG TABS tablet Take 1 tablet by mouth at bedtime as needed (sleep).   gentamicin   cream (GARAMYCIN ) 0.1 % APPLY TO ULCER RIGHT FOOT ONCE DAILY.   Glucagon (GVOKE HYPOPEN 2-PACK) 0.5 MG/0.1ML SOAJ as needed for hypoglycemia   glucose blood test strip OneTouch Ultra Test strips  TEST ONCE A DAY ONCE A DAY FINGERSTICK 90   hydrALAZINE  (APRESOLINE ) 25 MG tablet Take 3 tablets (75 mg total) by mouth every 8 (eight) hours.   hydrochlorothiazide (HYDRODIURIL) 25 MG tablet Take 25 mg by mouth daily.   ibuprofen (ADVIL) 200 MG tablet Take 400 mg by mouth every 6 (six) hours as needed for moderate pain.   Insulin  Degludec (TRESIBA  FLEXTOUCH) 200 UNIT/ML SOPN Inject 36 Units into the skin at bedtime. (Patient taking differently: Inject 22 Units into the skin at bedtime.)   Insulin  Pen Needle (PEN NEEDLES) 32G X 4 MM MISC BD Ultra-Fine Nano Pen Needle 32 gauge x 5/32  USE AS DIRECTED DAILY   ketoconazole (NIZORAL) 2 % cream Apply topically.   Lancets (ONETOUCH ULTRASOFT) lancets OneTouch UltraSoft Lancets  USE TWICE DAILY   Melatonin 10 MG CAPS Take 10 mg by mouth at bedtime as needed (sleep).   meloxicam  (MOBIC ) 7.5 MG tablet Take 1 tablet (7.5 mg total) by mouth daily as needed for pain.   Multiple Vitamins-Minerals (ADULT ONE DAILY GUMMIES) CHEW Chew 1 tablet by mouth 2 (two) times daily. Centrum   Multiple Vitamins-Minerals (AIRBORNE) TBEF Take 1 tablet by mouth daily  as needed (immune support).   Multiple Vitamins-Minerals (CENTRUM SILVER  PO)    Multiple Vitamins-Minerals (PRESERVISION AREDS 2) CAPS Take 1 capsule by mouth 2 (two) times daily.   mupirocin  ointment (BACTROBAN ) 2 % Apply to right foot ulcer once daily. (Patient taking differently: Apply 1 application  topically daily as needed (wound care). Apply to right foot ulcer once daily.)   nortriptyline  (PAMELOR ) 25 MG capsule Take 1 capsule (25 mg total) by mouth at bedtime. (Patient taking differently: Take 25 mg by mouth 2 (two) times daily.)   Omega-3 Fatty Acids (FISH OIL) 1200 MG CAPS Take 1,200 mg by mouth daily with  lunch.    omeprazole (PRILOSEC) 20 MG capsule Take 1 capsule by mouth daily.   ondansetron  (ZOFRAN ) 4 MG tablet Take 4 mg by mouth 3 (three) times daily as needed.   ONE TOUCH ULTRA TEST test strip    PRESCRIPTION MEDICATION by Intravitreal route every 30 (thirty) days. Ophthalmic injection at MD office in left eye   silver  sulfADIAZINE  (SILVADENE ) 1 % cream Apply pea-sized amount to wound daily. (Patient taking differently: Apply 1 application  topically daily. Apply pea-sized amount to wound daily.)   simvastatin  (ZOCOR ) 20 MG tablet Take 1 tablet by mouth daily.   traMADol  (ULTRAM ) 50 MG tablet Take 1 tablet (50 mg total) by mouth 2 (two) times daily as needed.   vitamin B-12 (CYANOCOBALAMIN ) 1000 MCG tablet Take 1,000 mcg by mouth 2 (two) times daily.    Current Facility-Administered Medications (Other)  Medication Route   Bevacizumab  (AVASTIN ) SOLN 1.25 mg Intravitreal   Bevacizumab  (AVASTIN ) SOLN 1.25 mg Intravitreal   REVIEW OF SYSTEMS: ROS   Positive for: Neurological, Skin, Genitourinary, Musculoskeletal, Endocrine, Eyes Negative for: Constitutional, Gastrointestinal, HENT, Cardiovascular, Respiratory, Psychiatric, Allergic/Imm, Heme/Lymph Last edited by Resa Delon ORN, COT on 07/12/2023  1:42 PM.     ALLERGIES No Known Allergies  PAST MEDICAL HISTORY Past Medical History:  Diagnosis Date   Anemia    Arthritis    Basal cell carcinoma 11/23/2017   left elbow, right neck TX CX3 5FU    Basal cell carcinoma 04/04/2019   infil-right sideburn (CX35FU)   Chronic kidney disease    stage 3 ckd stage 3 b per dr verdia 09-26-2019 note on chart   Complication of anesthesia    deifficulty waking up   COVID-19    Diabetic neuropathy (HCC)    Hyperlipidemia    Hypertension    Hypertensive retinopathy    OU   Infiltrative basal cell carcinoma (BCC) 04/22/2020   Right Zygomatic Area    Macular degeneration    OU   Nodular basal cell carcinoma (BCC) 04/22/2020    Right Anterior Neck   Pneumonia    Right carotid bruit per dr verdia 09-26-2019 note on chart   SCC (squamous cell carcinoma) 11/23/2017   right forehead TX CX3 5FU   SCCA (squamous cell carcinoma) of skin 04/22/2020   Mid Parietal Scalp (in situ)   SCCA (squamous cell carcinoma) of skin 04/22/2020   Right Dorsal Hand (in situ)   Type 2 DM with CKD stage 3 and hypertension (HCC)    Past Surgical History:  Procedure Laterality Date   ABDOMINAL HYSTERECTOMY     AMPUTATION TOE Right 09/28/2019   Procedure: Right 2nd toe amputation - partial;  Surgeon: Gretel Ozell PARAS, DPM;  Location: [facility];  Service: Podiatry;  Laterality: Right;   CATARACT EXTRACTION     CHOLECYSTECTOMY     EYE SURGERY  HERNIA REPAIR     SPINE SURGERY     TONSILLECTOMY     FAMILY HISTORY History reviewed. No pertinent family history.  SOCIAL HISTORY Social History   Tobacco Use   Smoking status: Former   Smokeless tobacco: Never  Advertising account planner   Vaping status: Never Used  Substance Use Topics   Alcohol  use: No    Alcohol /week: 0.0 standard drinks of alcohol    Drug use: No       OPHTHALMIC EXAM:  Base Eye Exam     Visual Acuity (Snellen - Linear)       Right Left   Dist cc 20/60 20/50   Dist ph cc 20/40 -2 NI         Tonometry (Tonopen, 1:48 PM)       Right Left   Pressure 12 14         Pupils       Pupils Dark Light Shape React APD   Right PERRL 4 3 Round Brisk None   Left PERRL 4 3 Round Brisk None         Visual Fields       Left Right    Full Full         Extraocular Movement       Right Left    Full, Ortho Full, Ortho         Neuro/Psych     Oriented x3: Yes   Mood/Affect: Normal         Dilation     Both eyes: 2.5% Phenylephrine @ 1:48 PM           Slit Lamp and Fundus Exam     Slit Lamp Exam       Right Left   Lids/Lashes Dermatochalasis - upper lid, Telangiectasia, mild MGD Dermatochalasis - upper lid,  Telangiectasia, Meibomian gland dysfunction   Conjunctiva/Sclera White and quiet White and quiet   Cornea Temporal Well healed cataract wounds, mild Arcus, trace endo pigment nasally, 1+PEE Arcus, Temporal Well healed cataract wounds, 1-2+ PEE, tear film debris   Anterior Chamber Deep and quiet Deep and quiet   Iris Round and moderately dilated to 4.60mm Round and moderately dilated to 5mm, Iris atrophy from 0100 to 0230, peripheral iridotomy at 0200   Lens 3-piece PC IOL in good position PC IOL in good position   Anterior Vitreous Vitreous syneresis, Posterior vitreous detachment, Weiss ring, vitreous condensations Vitreous syneresis, PVD, vitreous condensations         Fundus Exam       Right Left   Disc 360 Peripapillary atrophy, diffuse mild pallor, compact, sharp rim mild pallor, Peripapillary atrophy and pigmentation   C/D Ratio 0.1 0.2   Macula Blunted foveal reflex, +PED / CNV, RPE mottling, clumping and early atrophy, Drusen, No heme Blunted foveal reflex, low lying PED / +CNVM with stable improvement in IRF and SRF, RPE mottling and clumping, Drusen, No heme, early atrophy   Vessels Attenuated, Tortuous Attenuated, mild tortuosity   Periphery Attached, 360 Reticular degeneration, No heme Attached, 360 Reticular degeneration, No heme           Refraction     Wearing Rx       Sphere Cylinder Axis Add   Right -2.00 +1.25 161 +2.50   Left +0.25 +0.50 004 +2.50           IMAGING AND PROCEDURES  Imaging and Procedures for @TODAY @  OCT, Retina - OU - Both Eyes  Right Eye Quality was good. Central Foveal Thickness: 207. Progression has been stable. Findings include no IRF, no SRF, abnormal foveal contour, retinal drusen , subretinal hyper-reflective material, intraretinal hyper-reflective material, pigment epithelial detachment, outer retinal atrophy (Stable improvement in focal SRHM/IRHM; shallow central PED ).   Left Eye Quality was good. Central Foveal  Thickness: 247. Progression has been stable. Findings include normal foveal contour, no IRF, no SRF, retinal drusen , epiretinal membrane, pigment epithelial detachment, outer retinal atrophy (Stable improvement in shallow SRF and low lying PED; patchy central ORA, partial PVD).   Notes *Images captured and stored on drive  Diagnosis / Impression:  OD: Stable improvement in focal SRHM/IRHM; shallow central PED OS: Exudative ARMD -- stable improvement in shallow SRF and low lying PED; patchy central ORA, partial PVD  Clinical management:  See below  Abbreviations: NFP - Normal foveal profile. CME - cystoid macular edema. PED - pigment epithelial detachment. IRF - intraretinal fluid. SRF - subretinal fluid. EZ - ellipsoid zone. ERM - epiretinal membrane. ORA - outer retinal atrophy. ORT - outer retinal tubulation. SRHM - subretinal hyper-reflective material       Intravitreal Injection, Pharmacologic Agent - OD - Right Eye       Time Out 07/12/2023. 3:39 PM. Confirmed correct patient, procedure, site, and patient consented.   Anesthesia Topical anesthesia was used. Anesthetic medications included Lidocaine  2%, Proparacaine 0.5%.   Procedure Preparation included 5% betadine to ocular surface, eyelid speculum. A (32g) needle was used.   Injection: 1.25 mg Bevacizumab  1.25mg /0.83ml   Route: Intravitreal, Site: Right Eye   NDC: C2662926, Lot: [id number], Expiration date: 05/27/2023   Post-op Post injection exam found visual acuity of at least counting fingers. The patient tolerated the procedure well. There were no complications. The patient received written and verbal post procedure care education.      Intravitreal Injection, Pharmacologic Agent - OS - Left Eye       Time Out 07/12/2023. 3:39 PM. Confirmed correct patient, procedure, site, and patient consented.   Anesthesia Topical anesthesia was used. Anesthetic medications included Lidocaine  2%, Proparacaine 0.5%, Akten  3.5%.   Procedure Preparation included 5% betadine to ocular surface, eyelid speculum. A (32g) needle was used.   Injection: 1.25 mg Bevacizumab  1.25mg /0.33ml   Route: Intravitreal, Site: Left Eye   NDC: C2662926, Lot: [id number], Expiration date: 09/29/2023   Post-op Post injection exam found visual acuity of at least counting fingers. The patient tolerated the procedure well. There were no complications. The patient received written and verbal post procedure care education. Post injection medications were not given.            ASSESSMENT/PLAN:    ICD-10-CM   1. Exudative age-related macular degeneration of left eye with active choroidal neovascularization (HCC)  H35.3221 OCT, Retina - OU - Both Eyes    Intravitreal Injection, Pharmacologic Agent - OS - Left Eye    Bevacizumab  (AVASTIN ) SOLN 1.25 mg    2. Exudative age-related macular degeneration of right eye with active choroidal neovascularization (HCC)  H35.3211 OCT, Retina - OU - Both Eyes    Intravitreal Injection, Pharmacologic Agent - OD - Right Eye    Bevacizumab  (AVASTIN ) SOLN 1.25 mg    3. Diabetes mellitus type 2 without retinopathy (HCC)  E11.9     4. Long term (current) use of oral hypoglycemic drugs  Z79.84     5. Essential hypertension  I10     6. Hypertensive retinopathy of both eyes  H35.033  7. Pseudophakia of both eyes  Z96.1     8. Dry eyes  H04.123       1. Exudative age-related macular degeneration, left eye - conversion of OS from nonexudative to exudative ARMD prior to 02.05.20 visit - s/p IVA OS #1 (02.05.20), #2 (03.04.20), #3 (04.30.20), #4 (06.02.20), #5 (07.14.20), #6 (9.1.20), #7 (10.27.20), #8 (12.23.20), #9 (02.03.21), #10 (03.17.21), #11 (04.28.21), #12 (06.09.21), #13 (08.04.21), #14 (09.29.21), #15 (11.22.2021), #16 (01.26.22), #17 (04.06.22), #18 (06.28.22), #19 (9.13.22), #20 (11.29.22), #21 (01.31.23), #22 (04.04.23), #23 (05.30.23), #24 (07.27.23), #25 (09.19.23), #26 (11.07.23),  #27 (12.29.23), #28 (02.14.24), #29 (06.04.24), #30 (07.16.24), #31 (08.27.24), #32 (01.21.25), #33 (02.26.25), #34 (04.09.25) ============================== - s/p IVE OS #1 (10.01.24), #2 (11.08.24), #3 (12.10.24) **history of recurrent / worsening SRF at 9 wk interval, noted on 04.04.23 visit, at 8 week interval on 09.19.23 and 06.04.24** - OCT shows OS: stable improvement in shallow SRF and low lying PED; patchy central ORA, partial PVD at 6 weeks  - BCVA OS stable at 20/50 - recommend IVA OS #35 today, 06.17.25 w/ f/u in 5 wks -- [employer] funding unavailable  - pt wishes to proceed with IVA injection - RBA of procedure discussed, questions answered  - see procedure notes  - Avastin  informed consent obtained, signed and scanned 01.21.25 (OU)  - Eylea  approved for 2025 -- but no funding for [employer]  - f/u in 5 wks for DFE, OCT, possible injection OS   2. Exudative age-related macular degeneration, right eye  - conversion to exu ARMD on 06.04.24 exam  - s/p IVA OD #1 (06.04.24), #2 (07.16.24), #3 (08.27.24), #4 (01.21.25), #5 (02.26.25), #6 (04.09.25)  ==================================  - s/p IVE OD #1 (10.01.24), #2 (11.08.24), #3 (12.10.24) - OCT shows OD: Stable improvement in focal SRHM/IRHM, shallow central PED - slightly increased at 6 weeks  - recommend IVA OD #7 today, 06.17.25 with follow up in 5 weeks  - pt wishes to proceed with injection  - RBA of procedure discussed, questions answered - IVA informed consent obtained and signed, 01.21.25 (OU) - see procedure note  - f/u in 5 wks, DFE, OCT, possible injxn OD  3,4. Diabetes mellitus, type 2 without retinopathy - The incidence, risk factors for progression, natural history and treatment options for diabetic retinopathy  were discussed with patient.   - The need for close monitoring of blood glucose, blood pressure, and serum lipids, avoiding cigarette or any type of tobacco, and the need for long term follow up was also  discussed with patient.  - f/u in 1 year  5,6. Hypertensive retinopathy OU  - discussed importance of tight BP control  - continue to monitor  7. Pseudophakia OU  - s/p CE/IOL OU  - beautiful surgeries, doing well  - continue to monitor  8. Dry eyes OU  - pt on restasis  BID OU - recommend artificial tears and lubricating ointment as needed   Ophthalmic Meds Ordered this visit:  Meds ordered this encounter  Medications   Bevacizumab  (AVASTIN ) SOLN 1.25 mg   Bevacizumab  (AVASTIN ) SOLN 1.25 mg    Return in about 5 weeks (around 08/16/2023) for f/u exu ARMD OU, DFE, OCT.  There are no Patient Instructions on file for this visit.   This document serves as a record of services personally performed by Redell JUDITHANN Hans, MD, PhD. It was created on their behalf by Almetta Pesa, an ophthalmic technician. The creation of this record is the provider's dictation and/or activities during the visit.  Electronically signed by: Almetta Pesa, OA, 07/17/23  12:18 PM  This document serves as a record of services personally performed by Redell JUDITHANN Hans, MD, PhD. It was created on their behalf by Alan PARAS. Delores, OA an ophthalmic technician. The creation of this record is the provider's dictation and/or activities during the visit.    Electronically signed by: Alan PARAS. Delores, OA 07/17/23 12:18 PM  Redell JUDITHANN Hans, M.D., Ph.D. Diseases & Surgery of the Retina and Vitreous [facility]  I have reviewed the above documentation for accuracy and completeness, and I agree with the above. Redell JUDITHANN Hans, M.D., Ph.D. 07/17/23 12:31 PM   Abbreviations: M myopia (nearsighted); A astigmatism; H hyperopia (farsighted); P presbyopia; Mrx spectacle prescription;  CTL contact lenses; OD right eye; OS left eye; OU both eyes  XT exotropia; ET esotropia; PEK punctate epithelial keratitis; PEE punctate epithelial erosions; DES dry eye syndrome; MGD meibomian gland dysfunction; ATs  artificial tears; PFAT's preservative free artificial tears; NSC nuclear sclerotic cataract; PSC posterior subcapsular cataract; ERM epi-retinal membrane; PVD posterior vitreous detachment; RD retinal detachment; DM diabetes mellitus; DR diabetic retinopathy; NPDR non-proliferative diabetic retinopathy; PDR proliferative diabetic retinopathy; CSME clinically significant macular edema; DME diabetic macular edema; dbh dot blot hemorrhages; CWS cotton wool spot; POAG primary open angle glaucoma; C/D cup-to-disc ratio; HVF humphrey visual field; GVF goldmann visual field; OCT optical coherence tomography; IOP intraocular pressure; BRVO Branch retinal vein occlusion; CRVO central retinal vein occlusion; CRAO central retinal artery occlusion; BRAO branch retinal artery occlusion; RT retinal tear; SB scleral buckle; PPV pars plana vitrectomy; VH Vitreous hemorrhage; PRP panretinal laser photocoagulation; IVK intravitreal kenalog; VMT vitreomacular traction; MH Macular hole;  NVD neovascularization of the disc; NVE neovascularization elsewhere; AREDS age related eye disease study; ARMD age related macular degeneration; POAG primary open angle glaucoma; EBMD epithelial/anterior basement membrane dystrophy; ACIOL anterior chamber intraocular lens; IOL intraocular lens; PCIOL posterior chamber intraocular lens; Phaco/IOL phacoemulsification with intraocular lens placement; PRK photorefractive keratectomy; LASIK laser assisted in situ keratomileusis; HTN hypertension; DM diabetes mellitus; COPD chronic obstructive pulmonary disease

## 2023-07-08 ENCOUNTER — Ambulatory Visit: Admitting: Podiatry

## 2023-07-08 DIAGNOSIS — E1142 Type 2 diabetes mellitus with diabetic polyneuropathy: Secondary | ICD-10-CM

## 2023-07-08 DIAGNOSIS — Z8631 Personal history of diabetic foot ulcer: Secondary | ICD-10-CM

## 2023-07-12 ENCOUNTER — Encounter (INDEPENDENT_AMBULATORY_CARE_PROVIDER_SITE_OTHER): Payer: Self-pay | Admitting: Ophthalmology

## 2023-07-12 ENCOUNTER — Ambulatory Visit (INDEPENDENT_AMBULATORY_CARE_PROVIDER_SITE_OTHER): Admitting: Ophthalmology

## 2023-07-12 DIAGNOSIS — H04123 Dry eye syndrome of bilateral lacrimal glands: Secondary | ICD-10-CM | POA: Diagnosis not present

## 2023-07-12 DIAGNOSIS — Z961 Presence of intraocular lens: Secondary | ICD-10-CM

## 2023-07-12 DIAGNOSIS — Z7984 Long term (current) use of oral hypoglycemic drugs: Secondary | ICD-10-CM | POA: Diagnosis not present

## 2023-07-12 DIAGNOSIS — E119 Type 2 diabetes mellitus without complications: Secondary | ICD-10-CM

## 2023-07-12 DIAGNOSIS — H35033 Hypertensive retinopathy, bilateral: Secondary | ICD-10-CM

## 2023-07-12 DIAGNOSIS — H353211 Exudative age-related macular degeneration, right eye, with active choroidal neovascularization: Secondary | ICD-10-CM

## 2023-07-12 DIAGNOSIS — H353221 Exudative age-related macular degeneration, left eye, with active choroidal neovascularization: Secondary | ICD-10-CM

## 2023-07-12 DIAGNOSIS — H353231 Exudative age-related macular degeneration, bilateral, with active choroidal neovascularization: Secondary | ICD-10-CM

## 2023-07-12 DIAGNOSIS — I10 Essential (primary) hypertension: Secondary | ICD-10-CM | POA: Diagnosis not present

## 2023-07-12 MED ORDER — BEVACIZUMAB CHEMO INJECTION 1.25MG/0.05ML SYRINGE FOR KALEIDOSCOPE
1.2500 mg | INTRAVITREAL | Status: AC | PRN
Start: 2023-07-12 — End: 2023-07-12
  Administered 2023-07-12: 1.25 mg via INTRAVITREAL

## 2023-07-14 ENCOUNTER — Encounter: Payer: Self-pay | Admitting: Podiatry

## 2023-07-14 NOTE — Progress Notes (Signed)
  Subjective:  Patient ID: Sue [name], female    DOB: Jul 18, 1932,  MRN: [id number]  Sue [name] presents to clinic today for follow up diabetic ulcer plantar aspect right foot. She is accompanied by her daughter on today's visit   New problem(s):   PCP is Virgle Grime, MD. Sue [name] 05/10/2023.  No Known Allergies  Review of Systems: Negative except as noted in the HPI.  Objective: No changes noted in today's physical examination. There were no vitals filed for this visit. Sue [name] is a pleasant 89 y.o. female in NAD. AAO x 3.  Vascular Examination: Capillary refill time immediate b/l. Vascular status intact b/l with palpable pedal pulses. Pedal hair present b/l. No pain with calf compression b/l. Skin temperature gradient WNL b/l. No cyanosis or clubbing b/l. No ischemia or gangrene noted b/l.   Neurological Examination: Pt has subjective symptoms of neuropathy. Protective sensation diminished with 10g monofilament b/l.  Dermatological Examination: Pedal skin with normal turgor, texture and tone b/l. No interdigital macerations.   Toenails recently debrided.  Preulcerative lesion noted submet head 3 right foot. There is visible subdermal hemorrhage. There is no surrounding erythema, no edema, no drainage, no odor, no fluctuance.  Musculoskeletal Examination: Muscle strength 5/5 to all lower extremity muscle groups bilaterally. 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 Utilizes rollator for ambulation assistance.  Radiographs: None  Assessment/Plan: 1. Healed diabetic ulcer of foot   2. Diabetic peripheral neuropathy associated with type 2 diabetes mellitus (HCC)     -I applied a new dancer's paid for the insoles of her right foot. Continue offloaded insole. Bring in other shoes for me to offload on next visit. She will new closer monitoring (every4 weeks) as she has been breaking down at her 9 week intervals. -Patient to continue soft,  supportive shoe gear daily. -Preulcerative lesion pared submet head 3 right foot utilizing sterile scalpel blade. Total number pared=1. -Patient/POA to call should there be question/concern in the interim.   Return in about 4 weeks (around 08/05/2023).  Sue [name], DPM      [place] LOCATION: [address] [place] [address]                   Office [phone]   [facility] LOCATION: [address] [place], [place] [address] Office [phone]

## 2023-07-17 ENCOUNTER — Encounter (INDEPENDENT_AMBULATORY_CARE_PROVIDER_SITE_OTHER): Payer: Self-pay | Admitting: Ophthalmology

## 2023-07-25 ENCOUNTER — Emergency Department (HOSPITAL_BASED_OUTPATIENT_CLINIC_OR_DEPARTMENT_OTHER)

## 2023-07-25 ENCOUNTER — Encounter (HOSPITAL_BASED_OUTPATIENT_CLINIC_OR_DEPARTMENT_OTHER): Payer: Self-pay | Admitting: Emergency Medicine

## 2023-07-25 ENCOUNTER — Emergency Department (HOSPITAL_BASED_OUTPATIENT_CLINIC_OR_DEPARTMENT_OTHER)
Admission: EM | Admit: 2023-07-25 | Discharge: 2023-07-25 | Disposition: A | Discharge status: Home / Self Care | Attending: Emergency Medicine | Admitting: Emergency Medicine

## 2023-07-25 ENCOUNTER — Other Ambulatory Visit: Payer: Self-pay

## 2023-07-25 DIAGNOSIS — Z79899 Other long term (current) drug therapy: Secondary | ICD-10-CM | POA: Insufficient documentation

## 2023-07-25 DIAGNOSIS — M85812 Other specified disorders of bone density and structure, left shoulder: Secondary | ICD-10-CM | POA: Diagnosis not present

## 2023-07-25 DIAGNOSIS — Z794 Long term (current) use of insulin: Secondary | ICD-10-CM | POA: Insufficient documentation

## 2023-07-25 DIAGNOSIS — I129 Hypertensive chronic kidney disease with stage 1 through stage 4 chronic kidney disease, or unspecified chronic kidney disease: Secondary | ICD-10-CM | POA: Insufficient documentation

## 2023-07-25 DIAGNOSIS — E1122 Type 2 diabetes mellitus with diabetic chronic kidney disease: Secondary | ICD-10-CM | POA: Diagnosis not present

## 2023-07-25 DIAGNOSIS — N189 Chronic kidney disease, unspecified: Secondary | ICD-10-CM | POA: Diagnosis not present

## 2023-07-25 DIAGNOSIS — G8929 Other chronic pain: Secondary | ICD-10-CM | POA: Diagnosis not present

## 2023-07-25 DIAGNOSIS — M25512 Pain in left shoulder: Secondary | ICD-10-CM | POA: Insufficient documentation

## 2023-07-25 NOTE — Discharge Instructions (Signed)
 Your x-ray was without any evidence of fracture.  Your shoulder properly in place in the joint capsule.  Please follow-up with an orthopedic doctor.  I have given you the information for clinic in [place] that you can follow-up with.  Some gentle range of motion exercises where you move your elbow in full circles can be helpful to preserve range of motion.  Tylenol  1000 mg every 6 hours as needed for pain.

## 2023-07-25 NOTE — ED Triage Notes (Addendum)
 Pt POV with personal rolling walker- c/o L shoulder pain x1 month, progressively worsening.  Denies known injury. Pain worse with movement.

## 2023-07-25 NOTE — ED Provider Notes (Signed)
 [place] [facility] Provider Note   CSN: [id number] Arrival date & time: 07/25/23  1429     Patient presents with: Shoulder Pain   Sue [name] is a 89 y.o. female.    Shoulder Pain  Patient is an 89 year old female with a past medical history significant for HTN, anemia, HLD, DM 2, arthritis, CKD  Patient presents emergency room today with complaints of left shoulder pain.  Seems that her pain has been ongoing for approximately 1 month it occurs primarily when she reaches behind her either inferiorly or superiorly.  She denies any numbness or weakness in her arm.  She says she has normal grip strength has not been dropping objects.  She denies any pain in her elbow wrist or neck.  No fall or sudden injury.  She states 5 years ago she did injure her left shoulder while she was doing rehab from a COVID-19 infection.  She states that she did physical therapy at that time and was able to rehab this injury.  She states that she is able to move her arm around normally otherwise.  Patient does use a rolling walker at baseline for ambulation.  She specifically denies any chest pain or difficulty breathing, no hemoptysis syncope or near syncope.    Prior to Admission medications   Medication Sig Start Date End Date Taking? Authorizing Provider  amLODipine  (NORVASC ) 10 MG tablet Take 1 tablet by mouth daily.    [provider]  B-D ULTRAFINE III SHORT PEN 31G X 8 MM MISC USE AS DIRECTED ONCE DAILY SUBCUTANEOUSLY 09/14/17   [provider]  Calcium  Citrate-Vitamin D  (CALCIUM  + D PO)     [provider]  calcium -vitamin D  (OSCAL WITH D) 500-200 MG-UNIT tablet Take 1 tablet by mouth 2 (two) times daily.     [provider]  Continuous Blood Gluc Sensor (FREESTYLE LIBRE 2 SENSOR) MISC See admin instructions. 12/12/20   [provider]  Cyanocobalamin  (B-12 PO)     [provider]   diphenhydramine-acetaminophen  (TYLENOL  PM) 25-500 MG TABS tablet Take 1 tablet by mouth at bedtime as needed (sleep).    [provider]  gentamicin  cream (GARAMYCIN ) 0.1 % APPLY TO ULCER RIGHT FOOT ONCE DAILY. 05/25/23   Gaynel Delon CROME, DPM  Glucagon (GVOKE HYPOPEN 2-PACK) 0.5 MG/0.1ML SOAJ as needed for hypoglycemia 10/13/20   [provider]  glucose blood test strip OneTouch Ultra Test strips  TEST ONCE A DAY ONCE A DAY FINGERSTICK 90    [provider]  hydrALAZINE  (APRESOLINE ) 25 MG tablet Take 3 tablets (75 mg total) by mouth every 8 (eight) hours. 12/23/18   Milissa Tod PARAS, MD  hydrochlorothiazide (HYDRODIURIL) 25 MG tablet Take 25 mg by mouth daily.    [provider]  ibuprofen (ADVIL) 200 MG tablet Take 400 mg by mouth every 6 (six) hours as needed for moderate pain.    [provider]  Insulin  Degludec (TRESIBA  FLEXTOUCH) 200 UNIT/ML SOPN Inject 36 Units into the skin at bedtime. Patient taking differently: Inject 22 Units into the skin at bedtime. 12/23/18   Milissa Tod PARAS, MD  Insulin  Pen Needle (PEN NEEDLES) 32G X 4 MM MISC BD Ultra-Fine Nano Pen Needle 32 gauge x 5/32  USE AS DIRECTED DAILY    [provider]  ketoconazole (NIZORAL) 2 % cream Apply topically. 12/16/19   [provider]  Lancets (ONETOUCH ULTRASOFT) lancets OneTouch UltraSoft Lancets  USE TWICE DAILY  [provider]  Melatonin 10 MG CAPS Take 10 mg by mouth at bedtime as needed (sleep).    [provider]  meloxicam  (MOBIC ) 7.5 MG tablet Take 1 tablet (7.5 mg total) by mouth daily as needed for pain. 02/15/19   Hilts, Ozell, MD  Multiple Vitamins-Minerals (ADULT ONE DAILY GUMMIES) CHEW Chew 1 tablet by mouth 2 (two) times daily. Centrum    [provider]  Multiple Vitamins-Minerals (AIRBORNE) TBEF Take 1 tablet by mouth daily as needed (immune support).    [provider]  Multiple Vitamins-Minerals  (CENTRUM SILVER  PO)     [provider]  Multiple Vitamins-Minerals (PRESERVISION AREDS 2) CAPS Take 1 capsule by mouth 2 (two) times daily.    [provider]  mupirocin  ointment (BACTROBAN ) 2 % Apply to right foot ulcer once daily. Patient taking differently: Apply 1 application  topically daily as needed (wound care). Apply to right foot ulcer once daily. 06/26/19   Gaynel Delon CROME, DPM  nortriptyline  (PAMELOR ) 25 MG capsule Take 1 capsule (25 mg total) by mouth at bedtime. Patient taking differently: Take 25 mg by mouth 2 (two) times daily. 12/23/18   Milissa Tod PARAS, MD  Omega-3 Fatty Acids (FISH OIL) 1200 MG CAPS Take 1,200 mg by mouth daily with lunch.     [provider]  omeprazole (PRILOSEC) 20 MG capsule Take 1 capsule by mouth daily.    [provider]  ondansetron  (ZOFRAN ) 4 MG tablet Take 4 mg by mouth 3 (three) times daily as needed. 03/27/21   [provider]  ONE TOUCH ULTRA TEST test strip  12/07/17   [provider]  Polyethyl Glycol-Propyl Glycol (SYSTANE) 0.4-0.3 % SOLN Place 1 drop into both eyes 3 (three) times daily as needed (dry eyes).    [provider]  PRESCRIPTION MEDICATION by Intravitreal route every 30 (thirty) days. Ophthalmic injection at MD office in left eye    [provider]  RESTASIS  0.05 % ophthalmic emulsion Place 1 drop into both eyes 2 (two) times daily. 10/27/22   Valdemar Rogue, MD  silver  sulfADIAZINE  (SILVADENE ) 1 % cream Apply pea-sized amount to wound daily. Patient taking differently: Apply 1 application  topically daily. Apply pea-sized amount to wound daily. 09/04/19   Price, Michael J, DPM  simvastatin  (ZOCOR ) 20 MG tablet Take 1 tablet by mouth daily.    [provider]  traMADol  (ULTRAM ) 50 MG tablet Take 1 tablet (50 mg total) by mouth 2 (two) times daily as needed. 02/15/19   Hilts, Ozell, MD  vitamin B-12 (CYANOCOBALAMIN ) 1000 MCG tablet Take 1,000 mcg by mouth 2  (two) times daily.     [provider]    Allergies: Patient has no known allergies.    Review of Systems  Updated Vital Signs BP (!) 158/73 (BP Location: Right Arm)   Pulse 98   Temp 97.6 F (36.4 C)   Resp 20   Ht 5' 6 (1.676 m)   Wt 80.3 kg   SpO2 96%   BMI 28.57 kg/m   Physical Exam Vitals and nursing note reviewed.  Constitutional:      General: She is not in acute distress.    Appearance: Normal appearance. She is not ill-appearing.  HENT:     Head: Normocephalic and atraumatic.     Mouth/Throat:     Mouth: Mucous membranes are moist.   Eyes:     General: No scleral icterus.       Right eye: No  discharge.        Left eye: No discharge.     Conjunctiva/sclera: Conjunctivae normal.    Cardiovascular:     Rate and Rhythm: Normal rate.  Pulmonary:     Effort: Pulmonary effort is normal.     Breath sounds: No stridor.  Abdominal:     Tenderness: There is no abdominal tenderness.   Musculoskeletal:     Comments: Full range of motion passively of left shoulder, active range of motion is intact but with some discomfort at the extremes of flexion and extension of the left shoulder.  No discomfort with active internal or external rotation.  Elbow full active and passive rom without pain Wrists without tenderness or range of motion restriction   There is some tenderness palpation of the left anterior shoulder.  There is no swelling redness or rashes.   Skin:    General: Skin is warm and dry.   Neurological:     Mental Status: She is alert and oriented to person, place, and time. Mental status is at baseline.     (all labs ordered are listed, but only abnormal results are displayed) Labs Reviewed - No data to display  EKG: None  Radiology: DG Shoulder Left Result Date: 07/25/2023 CLINICAL DATA:  Left shoulder pain. EXAM: LEFT SHOULDER - 2+ VIEW COMPARISON:  None Available. FINDINGS: No acute fracture or dislocation. The bones are osteopenic. No  significant arthritic change. The soft tissues are unremarkable IMPRESSION: Negative. Electronically Signed   By: Vanetta Chou M.D.   On: 07/25/2023 15:28     Procedures   Medications Ordered in the ED - No data to display                                  Medical Decision Making Amount and/or Complexity of Data Reviewed Radiology: ordered.   Patient is an 89 year old female with a past medical history significant for HTN, anemia, HLD, DM 2, arthritis, CKD  Patient presents emergency room today with complaints of left shoulder pain.  Seems that her pain has been ongoing for approximately 1 month it occurs primarily when she reaches behind her either inferiorly or superiorly.  She denies any numbness or weakness in her arm.  She says she has normal grip strength has not been dropping objects.  She denies any pain in her elbow wrist or neck.  No fall or sudden injury.  She states 5 years ago she did injure her left shoulder while she was doing rehab from a COVID-19 infection.  She states that she did physical therapy at that time and was able to rehab this injury.  She states that she is able to move her arm around normally otherwise.  Patient does use a rolling walker at baseline for ambulation.  She specifically denies any chest pain or difficulty breathing, no hemoptysis syncope or near syncope.  Considered cervical radiculopathy, bursitis, adhesive capsulitis, calcific tendinitis, impingement syndrome or rotator cuff tear or rotator cuff strain, muscle strain.  I think muscle strain most likely given reproducible TTP.   I personally reviewed x-ray of left shoulder do not appreciate any fractures.  I do not appreciate any abnormal calcifications of the tendons.  She denies any acute injury and rather describes a gradual worsening of left shoulder pain.  Will have patient follow-up with orthopedics.  Return precautions discussed.   Final diagnoses:  Acute pain of left shoulder  ED Discharge Orders     None          Neldon Hamp RAMAN, [place] 07/25/23 1623    Darra Fonda MATSU, MD 07/25/23 [phone]

## 2023-08-08 ENCOUNTER — Ambulatory Visit: Admitting: Podiatry

## 2023-08-08 DIAGNOSIS — Z89421 Acquired absence of other right toe(s): Secondary | ICD-10-CM

## 2023-08-08 DIAGNOSIS — Z8631 Personal history of diabetic foot ulcer: Secondary | ICD-10-CM | POA: Diagnosis not present

## 2023-08-08 DIAGNOSIS — E1142 Type 2 diabetes mellitus with diabetic polyneuropathy: Secondary | ICD-10-CM | POA: Diagnosis not present

## 2023-08-14 ENCOUNTER — Encounter: Payer: Self-pay | Admitting: Podiatry

## 2023-08-14 NOTE — Progress Notes (Signed)
  Subjective:  Patient ID: Sue [name], female    DOB: Mar 29, 1932,  MRN: [id number]  Sue [name] presents to clinic today for follow up diabetic foot ulceration plantar aspect right foot. She is accompanied by her daughter on today's visit.   Chief Complaint  Patient presents with   Diabetic Ulcer    At risk foot care - follow up on right foot ulcer   New problem(s): None.   PCP is Verdia Lombard, MD. [name] 03/08/2023.  No Known Allergies  Review of Systems: Negative except as noted in the HPI.  Objective: No changes noted in today's physical examination. There were no vitals filed for this visit. Sue [name] is a pleasant 89 y.o. female in NAD. AAO x 3.  Vascular Examination: Capillary refill time immediate b/l. Vascular status intact b/l with palpable pedal pulses. Pedal hair present b/l. No pain with calf compression b/l. Skin temperature gradient WNL b/l. No cyanosis or clubbing b/l. No ischemia or gangrene noted b/l.   Neurological Examination: Pt has subjective symptoms of neuropathy. Protective sensation diminished with 10g monofilament b/l.  Dermatological Examination: Pedal skin with normal turgor, texture and tone b/l. No interdigital macerations.   Toenails recently debrided.  Preulcerative lesion noted submet head 3 right foot. There is visible subdermal hemorrhage. There is no surrounding erythema, no edema, no drainage, no odor, no fluctuance.  Musculoskeletal Examination: Muscle strength 5/5 to all lower extremity muscle groups bilaterally. 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 Utilizes rollator for ambulation assistance.  Radiographs: None  Assessment/Plan: 1. Healed diabetic ulcer of foot   2. 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 Diabetic peripheral neuropathy associated with type 2 diabetes mellitus (HCC)   -Patient was evaluated today. All questions/concerns addressed on today's  visit. -Patient's family member present. All questions/concerns addressed on today's visit. -As a courtesy, preulcerative lesion(s) submet head 2 right foot pared without complication or incident. Total number pared=1. -Shoe recommendations given for stretchable Aerosoles for formal wedding attendance. -Patient/POA to call should there be question/concern in the interim.   Return in about 5 weeks (around 09/12/2023).  [name], DPM      [place] LOCATION: [address] [place] [address]                   Office [phone]   [facility] LOCATION: [address] [place], [place] [address] Office [phone]

## 2023-08-16 ENCOUNTER — Encounter (INDEPENDENT_AMBULATORY_CARE_PROVIDER_SITE_OTHER): Admitting: Ophthalmology

## 2023-08-16 NOTE — Progress Notes (Shared)
 [facility] - Clinic Note  08/16/2023   CHIEF COMPLAINT Patient presents for No chief complaint on file.  HISTORY OF PRESENT ILLNESS: Sue [name] is a 89 y.o. female who presents to the clinic today for:    Referring physician: Verdia Lombard, MD [address] [place],  [place] [address]  HISTORICAL INFORMATION:   Selected notes from the MEDICAL RECORD NUMBER Referred by Dr. Medford Ferrier for concern of ARMD OU   CURRENT MEDICATIONS: Current Outpatient Medications (Ophthalmic Drugs)  Medication Sig   Polyethyl Glycol-Propyl Glycol (SYSTANE) 0.4-0.3 % SOLN Place 1 drop into both eyes 3 (three) times daily as needed (dry eyes).   RESTASIS  0.05 % ophthalmic emulsion Place 1 drop into both eyes 2 (two) times daily.   No current facility-administered medications for this visit. (Ophthalmic Drugs)   Current Outpatient Medications (Other)  Medication Sig   amLODipine  (NORVASC ) 10 MG tablet Take 1 tablet by mouth daily.   B-D ULTRAFINE III SHORT PEN 31G X 8 MM MISC USE AS DIRECTED ONCE DAILY SUBCUTANEOUSLY   Calcium  Citrate-Vitamin D  (CALCIUM  + D PO)    calcium -vitamin D  (OSCAL WITH D) 500-200 MG-UNIT tablet Take 1 tablet by mouth 2 (two) times daily.    Continuous Blood Gluc Sensor (FREESTYLE LIBRE 2 SENSOR) MISC See admin instructions.   Cyanocobalamin  (B-12 PO)    diphenhydramine-acetaminophen  (TYLENOL  PM) 25-500 MG TABS tablet Take 1 tablet by mouth at bedtime as needed (sleep).   gentamicin  cream (GARAMYCIN ) 0.1 % APPLY TO ULCER RIGHT FOOT ONCE DAILY.   Glucagon (GVOKE HYPOPEN 2-PACK) 0.5 MG/0.1ML SOAJ as needed for hypoglycemia   glucose blood test strip OneTouch Ultra Test strips  TEST ONCE A DAY ONCE A DAY FINGERSTICK 90   hydrALAZINE  (APRESOLINE ) 25 MG tablet Take 3 tablets (75 mg total) by mouth every 8 (eight) hours.   hydrochlorothiazide (HYDRODIURIL) 25 MG tablet Take 25 mg by mouth daily.   ibuprofen (ADVIL) 200 MG tablet Take  400 mg by mouth every 6 (six) hours as needed for moderate pain.   Insulin  Degludec (TRESIBA  FLEXTOUCH) 200 UNIT/ML SOPN Inject 36 Units into the skin at bedtime. (Patient taking differently: Inject 22 Units into the skin at bedtime.)   Insulin  Pen Needle (PEN NEEDLES) 32G X 4 MM MISC BD Ultra-Fine Nano Pen Needle 32 gauge x 5/32  USE AS DIRECTED DAILY   ketoconazole (NIZORAL) 2 % cream Apply topically.   Lancets (ONETOUCH ULTRASOFT) lancets OneTouch UltraSoft Lancets  USE TWICE DAILY   Melatonin 10 MG CAPS Take 10 mg by mouth at bedtime as needed (sleep).   meloxicam  (MOBIC ) 7.5 MG tablet Take 1 tablet (7.5 mg total) by mouth daily as needed for pain.   Multiple Vitamins-Minerals (ADULT ONE DAILY GUMMIES) CHEW Chew 1 tablet by mouth 2 (two) times daily. Centrum   Multiple Vitamins-Minerals (AIRBORNE) TBEF Take 1 tablet by mouth daily as needed (immune support).   Multiple Vitamins-Minerals (CENTRUM SILVER  PO)    Multiple Vitamins-Minerals (PRESERVISION AREDS 2) CAPS Take 1 capsule by mouth 2 (two) times daily.   mupirocin  ointment (BACTROBAN ) 2 % Apply to right foot ulcer once daily. (Patient taking differently: Apply 1 application  topically daily as needed (wound care). Apply to right foot ulcer once daily.)   nortriptyline  (PAMELOR ) 25 MG capsule Take 1 capsule (25 mg total) by mouth at bedtime. (Patient taking differently: Take 25 mg by mouth 2 (two) times daily.)   Omega-3 Fatty Acids (FISH OIL) 1200 MG CAPS  Take 1,200 mg by mouth daily with lunch.    omeprazole (PRILOSEC) 20 MG capsule Take 1 capsule by mouth daily.   ondansetron  (ZOFRAN ) 4 MG tablet Take 4 mg by mouth 3 (three) times daily as needed.   ONE TOUCH ULTRA TEST test strip    PRESCRIPTION MEDICATION by Intravitreal route every 30 (thirty) days. Ophthalmic injection at MD office in left eye   silver  sulfADIAZINE  (SILVADENE ) 1 % cream Apply pea-sized amount to wound daily. (Patient taking differently: Apply 1 application   topically daily. Apply pea-sized amount to wound daily.)   simvastatin  (ZOCOR ) 20 MG tablet Take 1 tablet by mouth daily.   traMADol  (ULTRAM ) 50 MG tablet Take 1 tablet (50 mg total) by mouth 2 (two) times daily as needed.   vitamin B-12 (CYANOCOBALAMIN ) 1000 MCG tablet Take 1,000 mcg by mouth 2 (two) times daily.    Current Facility-Administered Medications (Other)  Medication Route   Bevacizumab  (AVASTIN ) SOLN 1.25 mg Intravitreal   Bevacizumab  (AVASTIN ) SOLN 1.25 mg Intravitreal   REVIEW OF SYSTEMS:   ALLERGIES No Known Allergies  PAST MEDICAL HISTORY Past Medical History:  Diagnosis Date   Anemia    Arthritis    Basal cell carcinoma 11/23/2017   left elbow, right neck TX CX3 5FU    Basal cell carcinoma 04/04/2019   infil-right sideburn (CX35FU)   Chronic kidney disease    stage 3 ckd stage 3 b per dr verdia 09-26-2019 note on chart   Complication of anesthesia    deifficulty waking up   COVID-19    Diabetic neuropathy (HCC)    Hyperlipidemia    Hypertension    Hypertensive retinopathy    OU   Infiltrative basal cell carcinoma (BCC) 04/22/2020   Right Zygomatic Area    Macular degeneration    OU   Nodular basal cell carcinoma (BCC) 04/22/2020   Right Anterior Neck   Pneumonia    Right carotid bruit per dr verdia 09-26-2019 note on chart   SCC (squamous cell carcinoma) 11/23/2017   right forehead TX CX3 5FU   SCCA (squamous cell carcinoma) of skin 04/22/2020   Mid Parietal Scalp (in situ)   SCCA (squamous cell carcinoma) of skin 04/22/2020   Right Dorsal Hand (in situ)   Type 2 DM with CKD stage 3 and hypertension (HCC)    Past Surgical History:  Procedure Laterality Date   ABDOMINAL HYSTERECTOMY     AMPUTATION TOE Right 09/28/2019   Procedure: Right 2nd toe amputation - partial;  Surgeon: Gretel Ozell PARAS, DPM;  Location: [facility];  Service: Podiatry;  Laterality: Right;   CATARACT EXTRACTION     CHOLECYSTECTOMY     EYE SURGERY      HERNIA REPAIR     SPINE SURGERY     TONSILLECTOMY     FAMILY HISTORY No family history on file.  SOCIAL HISTORY Social History   Tobacco Use   Smoking status: Former   Smokeless tobacco: Never  Advertising account planner   Vaping status: Never Used  Substance Use Topics   Alcohol  use: No    Alcohol /week: 0.0 standard drinks of alcohol    Drug use: No       OPHTHALMIC EXAM:  Not recorded    IMAGING AND PROCEDURES  Imaging and Procedures for @TODAY @          ASSESSMENT/PLAN:    ICD-10-CM   1. Exudative age-related macular degeneration of left eye with active choroidal neovascularization (HCC)  Y64.6778  2. Exudative age-related macular degeneration of right eye with active choroidal neovascularization (HCC)  H35.3211     3. Diabetes mellitus type 2 without retinopathy (HCC)  E11.9     4. Long term (current) use of oral hypoglycemic drugs  Z79.84     5. Essential hypertension  I10     6. Hypertensive retinopathy of both eyes  H35.033     7. Pseudophakia of both eyes  Z96.1     8. Dry eyes  H04.123        1. Exudative age-related macular degeneration, left eye - conversion of OS from nonexudative to exudative ARMD prior to 02.05.20 visit - s/p IVA OS #1 (02.05.20), #2 (03.04.20), #3 (04.30.20), #4 (06.02.20), #5 (07.14.20), #6 (9.1.20), #7 (10.27.20), #8 (12.23.20), #9 (02.03.21), #10 (03.17.21), #11 (04.28.21), #12 (06.09.21), #13 (08.04.21), #14 (09.29.21), #15 (11.22.2021), #16 (01.26.22), #17 (04.06.22), #18 (06.28.22), #19 (9.13.22), #20 (11.29.22), #21 (01.31.23), #22 (04.04.23), #23 (05.30.23), #24 (07.27.23), #25 (09.19.23), #26 (11.07.23), #27 (12.29.23), #28 (02.14.24), #29 (06.04.24), #30 (07.16.24), #31 (08.27.24), #32 (01.21.25), #33 (02.26.25), #34 (04.09.25) ============================== - s/p IVE OS #1 (10.01.24), #2 (11.08.24), #3 (12.10.24) **history of recurrent / worsening SRF at 9 wk interval, noted on 04.04.23 visit, at 8 week interval on 09.19.23  and 06.04.24** - OCT shows OS: stable improvement in shallow SRF and low lying PED; patchy central ORA, partial PVD at 6 weeks  - BCVA OS stable at 20/50 - recommend IVA OS #35 today, 06.17.25 w/ f/u in 5 wks -- [employer] funding unavailable  - pt wishes to proceed with IVA injection - RBA of procedure discussed, questions answered  - see procedure notes  - Avastin  informed consent obtained, signed and scanned 01.21.25 (OU)  - Eylea  approved for 2025 -- but no funding for [employer]  - f/u in 5 wks for DFE, OCT, possible injection OS   2. Exudative age-related macular degeneration, right eye  - conversion to exu ARMD on 06.04.24 exam  - s/p IVA OD #1 (06.04.24), #2 (07.16.24), #3 (08.27.24), #4 (01.21.25), #5 (02.26.25), #6 (04.09.25), #7 (06.17.25)  ==================================  - s/p IVE OD #1 (10.01.24), #2 (11.08.24), #3 (12.10.24) - OCT shows OD: Stable improvement in focal SRHM/IRHM, shallow central PED - slightly increased at 6 weeks  - recommend IVA OD #8 today, 07.22.25 with follow up in 5 weeks  - pt wishes to proceed with injection  - RBA of procedure discussed, questions answered - IVA informed consent obtained and signed, 01.21.25 (OU) - see procedure note  - f/u in 5 wks, DFE, OCT, possible injxn OD  3,4. Diabetes mellitus, type 2 without retinopathy - The incidence, risk factors for progression, natural history and treatment options for diabetic retinopathy  were discussed with patient.   - The need for close monitoring of blood glucose, blood pressure, and serum lipids, avoiding cigarette or any type of tobacco, and the need for long term follow up was also discussed with patient.  - f/u in 1 year  5,6. Hypertensive retinopathy OU  - discussed importance of tight BP control  - continue to monitor  7. Pseudophakia OU  - s/p CE/IOL OU  - beautiful surgeries, doing well  - continue to monitor  8. Dry eyes OU  - pt on restasis  BID OU - recommend artificial  tears and lubricating ointment as needed  Ophthalmic Meds Ordered this visit:  No orders of the defined types were placed in this encounter.   No follow-ups on file.  There are no  Patient Instructions on file for this visit.   This document serves as a record of services personally performed by Redell JUDITHANN Hans, MD, PhD. It was created on their behalf by Auston Muzzy, COMT. The creation of this record is the provider's dictation and/or activities during the visit.  Electronically signed by: Auston Muzzy, COMT 08/16/23 6:58 AM    Redell JUDITHANN Hans, M.D., Ph.D. Diseases & Surgery of the Retina and Vitreous [facility]   Abbreviations: M myopia (nearsighted); A astigmatism; H hyperopia (farsighted); P presbyopia; Mrx spectacle prescription;  CTL contact lenses; OD right eye; OS left eye; OU both eyes  XT exotropia; ET esotropia; PEK punctate epithelial keratitis; PEE punctate epithelial erosions; DES dry eye syndrome; MGD meibomian gland dysfunction; ATs artificial tears; PFAT's preservative free artificial tears; NSC nuclear sclerotic cataract; PSC posterior subcapsular cataract; ERM epi-retinal membrane; PVD posterior vitreous detachment; RD retinal detachment; DM diabetes mellitus; DR diabetic retinopathy; NPDR non-proliferative diabetic retinopathy; PDR proliferative diabetic retinopathy; CSME clinically significant macular edema; DME diabetic macular edema; dbh dot blot hemorrhages; CWS cotton wool spot; POAG primary open angle glaucoma; C/D cup-to-disc ratio; HVF humphrey visual field; GVF goldmann visual field; OCT optical coherence tomography; IOP intraocular pressure; BRVO Branch retinal vein occlusion; CRVO central retinal vein occlusion; CRAO central retinal artery occlusion; BRAO branch retinal artery occlusion; RT retinal tear; SB scleral buckle; PPV pars plana vitrectomy; VH Vitreous hemorrhage; PRP panretinal laser photocoagulation; IVK intravitreal kenalog; VMT  vitreomacular traction; MH Macular hole;  NVD neovascularization of the disc; NVE neovascularization elsewhere; AREDS age related eye disease study; ARMD age related macular degeneration; POAG primary open angle glaucoma; EBMD epithelial/anterior basement membrane dystrophy; ACIOL anterior chamber intraocular lens; IOL intraocular lens; PCIOL posterior chamber intraocular lens; Phaco/IOL phacoemulsification with intraocular lens placement; PRK photorefractive keratectomy; LASIK laser assisted in situ keratomileusis; HTN hypertension; DM diabetes mellitus; COPD chronic obstructive pulmonary disease

## 2023-08-23 ENCOUNTER — Ambulatory Visit (INDEPENDENT_AMBULATORY_CARE_PROVIDER_SITE_OTHER): Admitting: Ophthalmology

## 2023-08-23 ENCOUNTER — Encounter (INDEPENDENT_AMBULATORY_CARE_PROVIDER_SITE_OTHER): Payer: Self-pay | Admitting: Ophthalmology

## 2023-08-23 DIAGNOSIS — Z7984 Long term (current) use of oral hypoglycemic drugs: Secondary | ICD-10-CM

## 2023-08-23 DIAGNOSIS — H35033 Hypertensive retinopathy, bilateral: Secondary | ICD-10-CM

## 2023-08-23 DIAGNOSIS — H353231 Exudative age-related macular degeneration, bilateral, with active choroidal neovascularization: Secondary | ICD-10-CM

## 2023-08-23 DIAGNOSIS — E119 Type 2 diabetes mellitus without complications: Secondary | ICD-10-CM | POA: Diagnosis not present

## 2023-08-23 DIAGNOSIS — H353211 Exudative age-related macular degeneration, right eye, with active choroidal neovascularization: Secondary | ICD-10-CM

## 2023-08-23 DIAGNOSIS — H04123 Dry eye syndrome of bilateral lacrimal glands: Secondary | ICD-10-CM | POA: Diagnosis not present

## 2023-08-23 DIAGNOSIS — Z961 Presence of intraocular lens: Secondary | ICD-10-CM

## 2023-08-23 DIAGNOSIS — H353221 Exudative age-related macular degeneration, left eye, with active choroidal neovascularization: Secondary | ICD-10-CM

## 2023-08-23 DIAGNOSIS — I10 Essential (primary) hypertension: Secondary | ICD-10-CM | POA: Diagnosis not present

## 2023-08-23 MED ORDER — BEVACIZUMAB CHEMO INJECTION 1.25MG/0.05ML SYRINGE FOR KALEIDOSCOPE
1.2500 mg | INTRAVITREAL | Status: AC | PRN
  Administered 2023-08-23: 1.25 mg via INTRAVITREAL

## 2023-08-23 NOTE — Progress Notes (Addendum)
 [facility] - Clinic Note  08/23/2023   CHIEF COMPLAINT Patient presents for Retina Follow Up  HISTORY OF PRESENT ILLNESS: Sue [name] is a 89 y.o. female who presents to the clinic today for:  HPI     Retina Follow Up   Patient presents with  Wet AMD.  In both eyes.  This started 5 weeks ago.  Duration of 5 weeks.  Since onset it is stable.  I, the attending physician,  performed the HPI with the patient and updated documentation appropriately.        Comments   5 week retina follow up ARMD and IVA pt is reporting no vision changes noticed she denies any flashes or floaters       Last edited by Valdemar Rogue, MD on 08/23/2023 11:45 PM.      Referring physician: Verdia Lombard, MD [address] [place],  [place] [address]  HISTORICAL INFORMATION:   Selected notes from the MEDICAL RECORD NUMBER Referred by Dr. Medford Ferrier for concern of ARMD OU   CURRENT MEDICATIONS: Current Outpatient Medications (Ophthalmic Drugs)  Medication Sig   Polyethyl Glycol-Propyl Glycol (SYSTANE) 0.4-0.3 % SOLN Place 1 drop into both eyes 3 (three) times daily as needed (dry eyes).   RESTASIS  0.05 % ophthalmic emulsion Place 1 drop into both eyes 2 (two) times daily.   No current facility-administered medications for this visit. (Ophthalmic Drugs)   Current Outpatient Medications (Other)  Medication Sig   amLODipine  (NORVASC ) 10 MG tablet Take 1 tablet by mouth daily.   B-D ULTRAFINE III SHORT PEN 31G X 8 MM MISC USE AS DIRECTED ONCE DAILY SUBCUTANEOUSLY   Calcium  Citrate-Vitamin D  (CALCIUM  + D PO)    calcium -vitamin D  (OSCAL WITH D) 500-200 MG-UNIT tablet Take 1 tablet by mouth 2 (two) times daily.    Continuous Blood Gluc Sensor (FREESTYLE LIBRE 2 SENSOR) MISC See admin instructions.   Cyanocobalamin  (B-12 PO)    diphenhydramine-acetaminophen  (TYLENOL  PM) 25-500 MG TABS tablet Take 1 tablet by mouth at bedtime as needed (sleep).   gentamicin   cream (GARAMYCIN ) 0.1 % APPLY TO ULCER RIGHT FOOT ONCE DAILY.   Glucagon (GVOKE HYPOPEN 2-PACK) 0.5 MG/0.1ML SOAJ as needed for hypoglycemia   glucose blood test strip OneTouch Ultra Test strips  TEST ONCE A DAY ONCE A DAY FINGERSTICK 90   hydrALAZINE  (APRESOLINE ) 25 MG tablet Take 3 tablets (75 mg total) by mouth every 8 (eight) hours.   hydrochlorothiazide (HYDRODIURIL) 25 MG tablet Take 25 mg by mouth daily.   ibuprofen (ADVIL) 200 MG tablet Take 400 mg by mouth every 6 (six) hours as needed for moderate pain.   Insulin  Degludec (TRESIBA  FLEXTOUCH) 200 UNIT/ML SOPN Inject 36 Units into the skin at bedtime. (Patient taking differently: Inject 22 Units into the skin at bedtime.)   Insulin  Pen Needle (PEN NEEDLES) 32G X 4 MM MISC BD Ultra-Fine Nano Pen Needle 32 gauge x 5/32  USE AS DIRECTED DAILY   ketoconazole (NIZORAL) 2 % cream Apply topically.   Lancets (ONETOUCH ULTRASOFT) lancets OneTouch UltraSoft Lancets  USE TWICE DAILY   Melatonin 10 MG CAPS Take 10 mg by mouth at bedtime as needed (sleep).   meloxicam  (MOBIC ) 7.5 MG tablet Take 1 tablet (7.5 mg total) by mouth daily as needed for pain.   Multiple Vitamins-Minerals (ADULT ONE DAILY GUMMIES) CHEW Chew 1 tablet by mouth 2 (two) times daily. Centrum   Multiple Vitamins-Minerals (AIRBORNE) TBEF Take 1 tablet by mouth daily as  needed (immune support).   Multiple Vitamins-Minerals (CENTRUM SILVER  PO)    Multiple Vitamins-Minerals (PRESERVISION AREDS 2) CAPS Take 1 capsule by mouth 2 (two) times daily.   mupirocin  ointment (BACTROBAN ) 2 % Apply to right foot ulcer once daily. (Patient taking differently: Apply 1 application  topically daily as needed (wound care). Apply to right foot ulcer once daily.)   nortriptyline  (PAMELOR ) 25 MG capsule Take 1 capsule (25 mg total) by mouth at bedtime. (Patient taking differently: Take 25 mg by mouth 2 (two) times daily.)   Omega-3 Fatty Acids (FISH OIL) 1200 MG CAPS Take 1,200 mg by mouth daily with  lunch.    omeprazole (PRILOSEC) 20 MG capsule Take 1 capsule by mouth daily.   ondansetron  (ZOFRAN ) 4 MG tablet Take 4 mg by mouth 3 (three) times daily as needed.   ONE TOUCH ULTRA TEST test strip    PRESCRIPTION MEDICATION by Intravitreal route every 30 (thirty) days. Ophthalmic injection at MD office in left eye   silver  sulfADIAZINE  (SILVADENE ) 1 % cream Apply pea-sized amount to wound daily. (Patient taking differently: Apply 1 application  topically daily. Apply pea-sized amount to wound daily.)   simvastatin  (ZOCOR ) 20 MG tablet Take 1 tablet by mouth daily.   traMADol  (ULTRAM ) 50 MG tablet Take 1 tablet (50 mg total) by mouth 2 (two) times daily as needed.   vitamin B-12 (CYANOCOBALAMIN ) 1000 MCG tablet Take 1,000 mcg by mouth 2 (two) times daily.    Current Facility-Administered Medications (Other)  Medication Route   Bevacizumab  (AVASTIN ) SOLN 1.25 mg Intravitreal   Bevacizumab  (AVASTIN ) SOLN 1.25 mg Intravitreal   REVIEW OF SYSTEMS: ROS   Positive for: Neurological, Skin, Genitourinary, Musculoskeletal, Endocrine, Eyes Negative for: Constitutional, Gastrointestinal, HENT, Cardiovascular, Respiratory, Psychiatric, Allergic/Imm, Heme/Lymph Last edited by Resa Delon ORN, COT on 08/23/2023  1:15 PM.     ALLERGIES No Known Allergies  PAST MEDICAL HISTORY Past Medical History:  Diagnosis Date   Anemia    Arthritis    Basal cell carcinoma 11/23/2017   left elbow, right neck TX CX3 5FU    Basal cell carcinoma 04/04/2019   infil-right sideburn (CX35FU)   Chronic kidney disease    stage 3 ckd stage 3 b per dr verdia 09-26-2019 note on chart   Complication of anesthesia    deifficulty waking up   COVID-19    Diabetic neuropathy (HCC)    Hyperlipidemia    Hypertension    Hypertensive retinopathy    OU   Infiltrative basal cell carcinoma (BCC) 04/22/2020   Right Zygomatic Area    Macular degeneration    OU   Nodular basal cell carcinoma (BCC) 04/22/2020    Right Anterior Neck   Pneumonia    Right carotid bruit per dr verdia 09-26-2019 note on chart   SCC (squamous cell carcinoma) 11/23/2017   right forehead TX CX3 5FU   SCCA (squamous cell carcinoma) of skin 04/22/2020   Mid Parietal Scalp (in situ)   SCCA (squamous cell carcinoma) of skin 04/22/2020   Right Dorsal Hand (in situ)   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 No pertinent family history.  SOCIAL HISTORY Social History   Tobacco Use   Smoking status: Former   Smokeless tobacco: Never  Advertising account planner   Vaping status: Never Used  Substance Use Topics   Alcohol  use: No    Alcohol /week: 0.0 standard drinks of alcohol    Drug use: No       OPHTHALMIC EXAM:  Base Eye Exam     Visual Acuity (Snellen - Linear)       Right Left   Dist cc 20/40 -2 20/40 -2   Dist ph cc NI NI         Tonometry (Tonopen, 1:21 PM)       Right Left   Pressure 14 14         Pupils       Pupils Dark Light Shape React APD   Right PERRL 4 3 Round Brisk None   Left PERRL 4 3 Round Brisk None         Visual Fields       Left Right    Full Full         Extraocular Movement       Right Left    Full, Ortho Full, Ortho         Neuro/Psych     Oriented x3: Yes   Mood/Affect: Normal         Dilation     Both eyes: 2.5% Phenylephrine @ 1:21 PM           Slit Lamp and Fundus Exam     Slit Lamp Exam       Right Left   Lids/Lashes Dermatochalasis - upper lid, Telangiectasia, mild MGD Dermatochalasis - upper lid,  Telangiectasia, Meibomian gland dysfunction   Conjunctiva/Sclera White and quiet White and quiet   Cornea Temporal Well healed cataract wounds, mild Arcus, trace endo pigment nasally, 1+PEE Arcus, Temporal Well healed cataract wounds, 1-2+ PEE, tear film debris   Anterior Chamber Deep and quiet Deep and quiet   Iris Round and moderately dilated to 4.32mm Round and moderately dilated to 5mm, Iris atrophy from 0100 to 0230, peripheral iridotomy at 0200   Lens 3-piece PC IOL in good position PC IOL in good position   Anterior Vitreous Vitreous syneresis, Posterior vitreous detachment, Weiss ring, vitreous condensations Vitreous syneresis, PVD, vitreous condensations         Fundus Exam       Right Left   Disc 360 Peripapillary atrophy, diffuse mild pallor, compact, sharp rim mild pallor, Peripapillary atrophy and pigmentation   C/D Ratio 0.1 0.2   Macula Blunted foveal reflex, +PED / CNV, RPE mottling, clumping and early atrophy, Drusen, No heme Blunted foveal reflex, low lying PED / +CNVM with stable improvement in IRF and SRF, RPE mottling and clumping, Drusen, No heme, early atrophy   Vessels Attenuated, Tortuous Attenuated, mild tortuosity   Periphery Attached, 360 Reticular degeneration, No heme Attached, 360 Reticular degeneration, No heme           Refraction     Wearing Rx       Sphere Cylinder Axis Add   Right -2.00 +1.25 161 +2.50   Left +0.25 +0.50 004 +2.50           IMAGING AND PROCEDURES  Imaging and Procedures for @TODAY @  OCT, Retina - OU - Both Eyes  Right Eye Quality was good. Central Foveal Thickness: 207. Progression has been stable. Findings include no IRF, no SRF, abnormal foveal contour, retinal drusen , subretinal hyper-reflective material, intraretinal hyper-reflective material, pigment epithelial detachment, outer retinal atrophy (Stable improvement in focal SRHM/IRHM; shallow central PED ).   Left Eye Quality was good. Central Foveal  Thickness: 232. Progression has been stable. Findings include normal foveal contour, no IRF, no SRF, retinal drusen , epiretinal membrane, pigment epithelial detachment, outer retinal atrophy (Stable improvement in shallow SRF and low lying PED; patchy central ORA, partial PVD).   Notes *Images captured and stored on drive  Diagnosis / Impression:  OD: Stable improvement in focal SRHM/IRHM; shallow central PED OS: Exudative ARMD -- stable improvement in shallow SRF and low lying PED; patchy central ORA, partial PVD  Clinical management:  See below  Abbreviations: NFP - Normal foveal profile. CME - cystoid macular edema. PED - pigment epithelial detachment. IRF - intraretinal fluid. SRF - subretinal fluid. EZ - ellipsoid zone. ERM - epiretinal membrane. ORA - outer retinal atrophy. ORT - outer retinal tubulation. SRHM - subretinal hyper-reflective material       Intravitreal Injection, Pharmacologic Agent - OS - Left Eye       Time Out 08/23/2023. 1:54 PM. Confirmed correct patient, procedure, site, and patient consented.   Anesthesia Topical anesthesia was used. Anesthetic medications included Lidocaine  2%, Proparacaine 0.5%, Akten 3.5%.   Procedure Preparation included 5% betadine to ocular surface, eyelid speculum. A (32g) needle was used.   Injection: 1.25 mg Bevacizumab  1.25mg /0.69ml   Route: Intravitreal, Site: Left Eye   NDC: H525437, Lot: [id number], Expiration date: 10/24/2023   Post-op Post injection exam found visual acuity of at least counting fingers. The patient tolerated the procedure well. There were no complications. The patient received written and verbal post procedure care education. Post injection medications were not given.      Intravitreal Injection, Pharmacologic Agent - OD - Right Eye       Time Out 08/23/2023. 1:54 PM. Confirmed correct patient, procedure, site, and patient consented.   Anesthesia Topical anesthesia was used. Anesthetic  medications included Lidocaine  2%, Proparacaine 0.5%.   Procedure Preparation included 5% betadine to ocular surface, eyelid speculum. A (32g) needle was used.   Injection: 1.25 mg Bevacizumab  1.25mg /0.76ml   Route: Intravitreal, Site: Right Eye   NDC: 49757-939-98, Lot: [id number]$$[id number]_QUzxwUsyCjbqOoswGtojcvkzTdnfDNUi$ , Expiration date: 09/18/2023   Post-op Post injection exam found visual acuity of at least counting fingers. The patient tolerated the procedure well. There were no complications. The patient received written and verbal post procedure care education.            ASSESSMENT/PLAN:    ICD-10-CM   1. Exudative age-related macular degeneration of left eye with active choroidal neovascularization (HCC)  H35.3221 OCT, Retina - OU - Both Eyes    Intravitreal Injection, Pharmacologic Agent - OS - Left Eye    Bevacizumab  (AVASTIN ) SOLN 1.25 mg    2. Exudative age-related macular degeneration of right eye with active choroidal neovascularization (HCC)  H35.3211 OCT, Retina - OU - Both Eyes    Intravitreal Injection, Pharmacologic Agent - OD - Right Eye    Bevacizumab  (AVASTIN ) SOLN 1.25 mg    3. Diabetes mellitus type 2 without retinopathy (HCC)  E11.9     4. Long term (current) use of oral hypoglycemic drugs  Z79.84     5. Essential hypertension  I10     6. Hypertensive retinopathy of both eyes  H35.033  7. Pseudophakia of both eyes  Z96.1     8. Dry eyes  H04.123      1. Exudative age-related macular degeneration, left eye - conversion of OS from nonexudative to exudative ARMD prior to 02.05.20 visit - s/p IVA OS #1 (02.05.20), #2 (03.04.20), #3 (04.30.20), #4 (06.02.20), #5 (07.14.20), #6 (9.1.20), #7 (10.27.20), #8 (12.23.20), #9 (02.03.21), #10 (03.17.21), #11 (04.28.21), #12 (06.09.21), #13 (08.04.21), #14 (09.29.21), #15 (11.22.2021), #16 (01.26.22), #17 (04.06.22), #18 (06.28.22), #19 (9.13.22), #20 (11.29.22), #21 (01.31.23), #22 (04.04.23), #23 (05.30.23), #24 (07.27.23), #25 (09.19.23), #26  (11.07.23), #27 (12.29.23), #28 (02.14.24), #29 (06.04.24), #30 (07.16.24), #31 (08.27.24), #32 (01.21.25), #33 (02.26.25), #34 (04.09.25) ============================== - s/p IVE OS #1 (10.01.24), #2 (11.08.24), #3 (12.10.24) **history of recurrent / worsening SRF at 9 wk interval, noted on 04.04.23 visit, at 8 week interval on 09.19.23 and 06.04.24** - OCT shows OS: stable improvement in shallow SRF and low lying PED; patchy central ORA, partial PVD at 6 weeks  - BCVA OS 20/40 from 20/50 - recommend IVA OS #35 today, 06.17.25 w/ f/u extended to 7wks -- [employer] funding unavailable  - pt wishes to proceed with IVA injection - RBA of procedure discussed, questions answered  - see procedure notes  - Avastin  informed consent obtained, signed and scanned 01.21.25 (OU)  - Eylea  approved for 2025 -- but no funding for [employer]  - f/u in 7 wks for DFE, OCT, possible injection OS   2. Exudative age-related macular degeneration, right eye  - conversion to exu ARMD on 06.04.24 exam  - s/p IVA OD #1 (06.04.24), #2 (07.16.24), #3 (08.27.24), #4 (01.21.25), #5 (02.26.25), #6 (04.09.25), #7 (06.17.25)  ==================================  - s/p IVE OD #1 (10.01.24), #2 (11.08.24), #3 (12.10.24)  - BCVA OD 20/40 - stable - OCT shows OD: Stable improvement in focal SRHM/IRHM, shallow central PED at 6 weeks  - recommend IVA OD #8 today, 07.29.25 with follow up extended to 7 weeks  - pt wishes to proceed with injection  - RBA of procedure discussed, questions answered - IVA informed consent obtained and signed, 01.21.25 (OU) - see procedure note  - f/u in 7 wks, DFE, OCT, possible injxn OD  3,4. Diabetes mellitus, type 2 without retinopathy - The incidence, risk factors for progression, natural history and treatment options for diabetic retinopathy  were discussed with patient.   - The need for close monitoring of blood glucose, blood pressure, and serum lipids, avoiding cigarette or any type of  tobacco, and the need for long term follow up was also discussed with patient.  - f/u in 1 year  5,6. Hypertensive retinopathy OU  - discussed importance of tight BP control  - continue to monitor  7. Pseudophakia OU  - s/p CE/IOL OU  - beautiful surgeries, doing well  - continue to monitor  8. Dry eyes OU  - pt on restasis  BID OU - recommend artificial tears and lubricating ointment as needed  Ophthalmic Meds Ordered this visit:  Meds ordered this encounter  Medications   Bevacizumab  (AVASTIN ) SOLN 1.25 mg   Bevacizumab  (AVASTIN ) SOLN 1.25 mg    Return in about 7 weeks (around 10/11/2023) for f/u exu ARMD OU, DFE, OCT, Possible Injxn.  There are no Patient Instructions on file for this visit.   This document serves as a record of services personally performed by Redell JUDITHANN Hans, MD, PhD. It was created on their behalf by Auston Muzzy, COMT. The creation of this record is the provider's dictation  and/or activities during the visit.  Electronically signed by: Auston Muzzy, COMT 08/23/23 11:55 PM  Redell JUDITHANN Hans, M.D., Ph.D. Diseases & Surgery of the Retina and Vitreous [facility]  I have reviewed the above documentation for accuracy and completeness, and I agree with the above. Redell JUDITHANN Hans, M.D., Ph.D. 08/23/23 11:55 PM   Abbreviations: M myopia (nearsighted); A astigmatism; H hyperopia (farsighted); P presbyopia; Mrx spectacle prescription;  CTL contact lenses; OD right eye; OS left eye; OU both eyes  XT exotropia; ET esotropia; PEK punctate epithelial keratitis; PEE punctate epithelial erosions; DES dry eye syndrome; MGD meibomian gland dysfunction; ATs artificial tears; PFAT's preservative free artificial tears; NSC nuclear sclerotic cataract; PSC posterior subcapsular cataract; ERM epi-retinal membrane; PVD posterior vitreous detachment; RD retinal detachment; DM diabetes mellitus; DR diabetic retinopathy; NPDR non-proliferative diabetic  retinopathy; PDR proliferative diabetic retinopathy; CSME clinically significant macular edema; DME diabetic macular edema; dbh dot blot hemorrhages; CWS cotton wool spot; POAG primary open angle glaucoma; C/D cup-to-disc ratio; HVF humphrey visual field; GVF goldmann visual field; OCT optical coherence tomography; IOP intraocular pressure; BRVO Branch retinal vein occlusion; CRVO central retinal vein occlusion; CRAO central retinal artery occlusion; BRAO branch retinal artery occlusion; RT retinal tear; SB scleral buckle; PPV pars plana vitrectomy; VH Vitreous hemorrhage; PRP panretinal laser photocoagulation; IVK intravitreal kenalog; VMT vitreomacular traction; MH Macular hole;  NVD neovascularization of the disc; NVE neovascularization elsewhere; AREDS age related eye disease study; ARMD age related macular degeneration; POAG primary open angle glaucoma; EBMD epithelial/anterior basement membrane dystrophy; ACIOL anterior chamber intraocular lens; IOL intraocular lens; PCIOL posterior chamber intraocular lens; Phaco/IOL phacoemulsification with intraocular lens placement; PRK photorefractive keratectomy; LASIK laser assisted in situ keratomileusis; HTN hypertension; DM diabetes mellitus; COPD chronic obstructive pulmonary disease

## 2023-09-12 ENCOUNTER — Encounter: Payer: Self-pay | Admitting: Podiatry

## 2023-09-12 ENCOUNTER — Ambulatory Visit: Admitting: Podiatry

## 2023-09-12 DIAGNOSIS — L84 Corns and callosities: Secondary | ICD-10-CM | POA: Diagnosis not present

## 2023-09-12 DIAGNOSIS — M79675 Pain in left toe(s): Secondary | ICD-10-CM

## 2023-09-12 DIAGNOSIS — Z89421 Acquired absence of other right toe(s): Secondary | ICD-10-CM

## 2023-09-12 DIAGNOSIS — B351 Tinea unguium: Secondary | ICD-10-CM

## 2023-09-12 DIAGNOSIS — M79674 Pain in right toe(s): Secondary | ICD-10-CM | POA: Diagnosis not present

## 2023-09-12 DIAGNOSIS — E1142 Type 2 diabetes mellitus with diabetic polyneuropathy: Secondary | ICD-10-CM

## 2023-09-12 NOTE — Progress Notes (Signed)
  Subjective:  Patient ID: Sue [name], female    DOB: 01/13/1933,  MRN: [id number]  Sue [name] presents to clinic today for at risk foot care with history of diabetic neuropathy and preulcerative lesion(s) right foot and painful mycotic toenails that limit ambulation. Painful toenails interfere with ambulation. Aggravating factors include wearing enclosed shoe gear. Pain is relieved with periodic professional debridement. Painful preulcerative lesion(s) is/are aggravated when weightbearing with and without shoegear. Pain is relieved with periodic professional debridement.  Chief Complaint  Patient presents with   Diabetes    DFC IDDM A1C 8.8. Toenail trim. LOV with PCP 05/17/23.   New problem(s): None.   PCP is Sue Lombard, MD.  No Known Allergies  Review of Systems: Negative except as noted in the HPI.  Objective: No changes noted in today's physical examination. There were no vitals filed for this visit. Sue [name] is a pleasant 89 y.o. female WD, WN in NAD. AAO x 3.  Vascular Examination: Capillary refill time immediate b/l. Vascular status intact b/l with palpable pedal pulses. Pedal hair present b/l. No pain with calf compression b/l. Skin temperature gradient WNL b/l. No cyanosis or clubbing b/l. No ischemia or gangrene noted b/l.   Neurological Examination: Pt has subjective symptoms of neuropathy. Protective sensation diminished with 10g monofilament b/l.  Dermatological Examination: Pedal skin with normal turgor, texture and tone b/l. No interdigital macerations.   Toenails 1-5 bilaterally elongated, discolored, dystrophic, thickened, and crumbly with subungual debris and tenderness to dorsal palpation.  Preulcerative lesion noted submet head 2 right foot. There is visible subdermal hemorrhage. There is no surrounding erythema, no edema, no drainage, no odor, no fluctuance.  Musculoskeletal Examination: Muscle strength 5/5 to all lower extremity muscle  groups bilaterally. 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 Utilizes rollator for ambulation assistance.  Radiographs: None  Assessment/Plan: 1. Pain due to onychomycosis of toenails of both feet   2. Pre-ulcerative calluses   3. History of partial amputation of toe of right foot (HCC)   4. Diabetic peripheral neuropathy associated with type 2 diabetes mellitus [facility])     Consent given for treatment. Patient examined. All patient's and/or POA's questions/concerns addressed on today's visit. Mycotic toenails 1-5 debrided in length and girth without incident. Preulcerative lesion(s) submet head 2 right foot pared with sharp debridement without incident. Dancer's pad applied to insole of right shoe. Extra dancer's pad given to daughter to change in one month. Continue soft, supportive shoe gear daily. Report any pedal injuries to medical professional. Call office if there are any quesitons/concerns. We will attempt a 9 week interval. Daughter will call if there are any concerns in the interim.  Return in about 9 weeks (around 11/14/2023).  Sue [name], DPM      [place] LOCATION: [address], [place] [address]                   Office [phone]   [facility] LOCATION: [address] [place], [place] [address] Office [phone]

## 2023-09-17 ENCOUNTER — Encounter: Payer: Self-pay | Admitting: Podiatry

## 2023-09-27 NOTE — [place] Notes (Signed)
 [facility] - Clinic Note  10/11/2023   CHIEF COMPLAINT Patient presents for Retina Follow Up  HISTORY OF PRESENT ILLNESS: Sue [name] is a 89 y.o. female who presents to the clinic today for:  HPI     Retina Follow Up   Patient presents with  Wet AMD.  In both eyes.  Duration of 7 weeks.  Since onset it is stable.  I, the attending physician,  performed the HPI with the patient and updated documentation appropriately.        Comments   Pt states she feels like she could get through the [place] chart faster. Pt denies any changes in vision/FOL/floaters/pain. Pt is using Restasis  BID OU and Ats QID OU.       Last edited by Valdemar Rogue, MD on 10/18/2023  3:48 PM.     Patient feels the vision is doing well.   Referring physician: Verdia Lombard, MD [address] [place],  [place] [address]  HISTORICAL INFORMATION:   Selected notes from the MEDICAL RECORD NUMBER Referred by Dr. Medford Ferrier for concern of ARMD OU   CURRENT MEDICATIONS: Current Outpatient Medications (Ophthalmic Drugs)  Medication Sig   Polyethyl Glycol-Propyl Glycol (SYSTANE) 0.4-0.3 % SOLN Place 1 drop into both eyes 3 (three) times daily as needed (dry eyes).   RESTASIS  0.05 % ophthalmic emulsion Place 1 drop into both eyes 2 (two) times daily.   No current facility-administered medications for this visit. (Ophthalmic Drugs)   Current Outpatient Medications (Other)  Medication Sig   amLODipine  (NORVASC ) 10 MG tablet Take 1 tablet by mouth daily.   B-D ULTRAFINE III SHORT PEN 31G X 8 MM MISC USE AS DIRECTED ONCE DAILY SUBCUTANEOUSLY   Calcium  Citrate-Vitamin D  (CALCIUM  + D PO)    calcium -vitamin D  (OSCAL WITH D) 500-200 MG-UNIT tablet Take 1 tablet by mouth 2 (two) times daily.    Continuous Blood Gluc Sensor (FREESTYLE LIBRE 2 SENSOR) MISC See admin instructions.   Cyanocobalamin  (B-12 PO)    diphenhydramine-acetaminophen  (TYLENOL  PM) 25-500 MG TABS tablet Take 1  tablet by mouth at bedtime as needed (sleep).   gentamicin  cream (GARAMYCIN ) 0.1 % APPLY TO ULCER RIGHT FOOT ONCE DAILY.   Glucagon (GVOKE HYPOPEN 2-PACK) 0.5 MG/0.1ML SOAJ as needed for hypoglycemia   glucose blood test strip OneTouch Ultra Test strips  TEST ONCE A DAY ONCE A DAY FINGERSTICK 90   hydrALAZINE  (APRESOLINE ) 25 MG tablet Take 3 tablets (75 mg total) by mouth every 8 (eight) hours.   hydrochlorothiazide (HYDRODIURIL) 25 MG tablet Take 25 mg by mouth daily.   ibuprofen (ADVIL) 200 MG tablet Take 400 mg by mouth every 6 (six) hours as needed for moderate pain.   Insulin  Degludec (TRESIBA  FLEXTOUCH) 200 UNIT/ML SOPN Inject 36 Units into the skin at bedtime. (Patient taking differently: Inject 22 Units into the skin at bedtime.)   Insulin  Pen Needle (PEN NEEDLES) 32G X 4 MM MISC BD Ultra-Fine Nano Pen Needle 32 gauge x 5/32  USE AS DIRECTED DAILY   ketoconazole (NIZORAL) 2 % cream Apply topically.   Lancets (ONETOUCH ULTRASOFT) lancets OneTouch UltraSoft Lancets  USE TWICE DAILY   Melatonin 10 MG CAPS Take 10 mg by mouth at bedtime as needed (sleep).   meloxicam  (MOBIC ) 7.5 MG tablet Take 1 tablet (7.5 mg total) by mouth daily as needed for pain.   Multiple Vitamins-Minerals (ADULT ONE DAILY GUMMIES) CHEW Chew 1 tablet by mouth 2 (two) times daily. Centrum  Multiple Vitamins-Minerals (AIRBORNE) TBEF Take 1 tablet by mouth daily as needed (immune support).   Multiple Vitamins-Minerals (CENTRUM SILVER  PO)    Multiple Vitamins-Minerals (PRESERVISION AREDS 2) CAPS Take 1 capsule by mouth 2 (two) times daily.   mupirocin  ointment (BACTROBAN ) 2 % Apply to right foot ulcer once daily. (Patient taking differently: Apply 1 application  topically daily as needed (wound care). Apply to right foot ulcer once daily.)   nortriptyline  (PAMELOR ) 25 MG capsule Take 1 capsule (25 mg total) by mouth at bedtime. (Patient taking differently: Take 25 mg by mouth 2 (two) times daily.)   Omega-3 Fatty Acids  (FISH OIL) 1200 MG CAPS Take 1,200 mg by mouth daily with lunch.    omeprazole (PRILOSEC) 20 MG capsule Take 1 capsule by mouth daily.   ondansetron  (ZOFRAN ) 4 MG tablet Take 4 mg by mouth 3 (three) times daily as needed.   ONE TOUCH ULTRA TEST test strip    PRESCRIPTION MEDICATION by Intravitreal route every 30 (thirty) days. Ophthalmic injection at MD office in left eye   silver  sulfADIAZINE  (SILVADENE ) 1 % cream Apply pea-sized amount to wound daily. (Patient taking differently: Apply 1 application  topically daily. Apply pea-sized amount to wound daily.)   simvastatin  (ZOCOR ) 20 MG tablet Take 1 tablet by mouth daily.   traMADol  (ULTRAM ) 50 MG tablet Take 1 tablet (50 mg total) by mouth 2 (two) times daily as needed.   vitamin B-12 (CYANOCOBALAMIN ) 1000 MCG tablet Take 1,000 mcg by mouth 2 (two) times daily.    Current Facility-Administered Medications (Other)  Medication Route   Bevacizumab  (AVASTIN ) SOLN 1.25 mg Intravitreal   Bevacizumab  (AVASTIN ) SOLN 1.25 mg Intravitreal   REVIEW OF SYSTEMS: ROS   Positive for: Neurological, Skin, Genitourinary, Musculoskeletal, Endocrine, Eyes Negative for: Constitutional, Gastrointestinal, HENT, Cardiovascular, Respiratory, Psychiatric, Allergic/Imm, Heme/Lymph Last edited by Elnor Avelina RAMAN, COT on 10/11/2023  1:24 PM.      ALLERGIES No Known Allergies  PAST MEDICAL HISTORY Past Medical History:  Diagnosis Date   Anemia    Arthritis    Basal cell carcinoma 11/23/2017   left elbow, right neck TX CX3 5FU    Basal cell carcinoma 04/04/2019   infil-right sideburn (CX35FU)   Chronic kidney disease    stage 3 ckd stage 3 b per dr verdia 09-26-2019 note on chart   Complication of anesthesia    deifficulty waking up   COVID-19    Diabetic neuropathy (HCC)    Hyperlipidemia    Hypertension    Hypertensive retinopathy    OU   Infiltrative basal cell carcinoma (BCC) 04/22/2020   Right Zygomatic Area    Macular degeneration    OU    Nodular basal cell carcinoma (BCC) 04/22/2020   Right Anterior Neck   Pneumonia    Right carotid bruit per dr verdia 09-26-2019 note on chart   SCC (squamous cell carcinoma) 11/23/2017   right forehead TX CX3 5FU   SCCA (squamous cell carcinoma) of skin 04/22/2020   Mid Parietal Scalp (in situ)   SCCA (squamous cell carcinoma) of skin 04/22/2020   Right Dorsal Hand (in situ)   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 No pertinent family history.  SOCIAL HISTORY Social History   Tobacco Use   Smoking status: Former   Smokeless tobacco: Never  Advertising account planner   Vaping status: Never Used  Substance Use Topics   Alcohol  use: No    Alcohol /week: 0.0 standard drinks of alcohol    Drug use: No       OPHTHALMIC EXAM:  Base Eye Exam     Visual Acuity (Snellen - Linear)       Right Left   Dist cc 20/50 -2 20/40 -2   Dist ph cc NI NI    Correction: Glasses         Tonometry (Tonopen, 1:20 PM)       Right Left   Pressure 15 12         Pupils       Pupils Dark Light Shape React APD   Right PERRL 3 2 Round Brisk None   Left PERRL 3 2 Round Brisk None         Visual Fields       Left Right    Full Full         Extraocular Movement       Right Left    Full, Ortho Full, Ortho         Neuro/Psych     Oriented x3: Yes   Mood/Affect: Normal         Dilation     Both eyes: 1.0% Mydriacyl, 2.5% Phenylephrine @ 1:21 PM           Slit Lamp and Fundus Exam     Slit Lamp Exam       Right Left   Lids/Lashes  Dermatochalasis - upper lid, Telangiectasia, mild MGD Dermatochalasis - upper lid, Telangiectasia, Meibomian gland dysfunction   Conjunctiva/Sclera White and quiet White and quiet   Cornea Temporal Well healed cataract wounds, mild Arcus, trace endo pigment nasally, 1+PEE Arcus, Temporal Well healed cataract wounds, 1-2+ PEE, tear film debris   Anterior Chamber Deep and quiet Deep and quiet   Iris Round and moderately dilated to 4.38mm Round and moderately dilated to 5mm, Iris atrophy from 0100 to 0230, peripheral iridotomy at 0200   Lens 3-piece PC IOL in good position PC IOL in good position   Anterior Vitreous Vitreous syneresis, Posterior vitreous detachment, Weiss ring, vitreous condensations Vitreous syneresis, PVD, vitreous condensations         Fundus Exam       Right Left   Disc 360 Peripapillary atrophy, diffuse mild pallor, compact, sharp rim mild pallor, Peripapillary atrophy and pigmentation   C/D Ratio 0.1 0.2   Macula Blunted foveal reflex, +PED / CNV- slightly increased, RPE mottling, clumping and early atrophy, Drusen, No heme Blunted foveal reflex, low lying PED / +CNVM with stable improvement in IRF and SRF, RPE mottling and clumping, Drusen, No heme, early atrophy   Vessels Attenuated, Tortuous Attenuated, mild tortuosity   Periphery Attached, 360 Reticular degeneration, No heme Attached, 360 Reticular degeneration, No heme           Refraction     Wearing Rx       Sphere Cylinder Axis Add   Right -2.00 +1.25 161 +2.50   Left +0.25 +0.50 004 +2.50  IMAGING AND PROCEDURES  Imaging and Procedures for @TODAY @  OCT, Retina - OU - Both Eyes       Right Eye Quality was good. Central Foveal Thickness: 211. Progression has worsened. Findings include no IRF, no SRF, abnormal foveal contour, retinal drusen , subretinal hyper-reflective material, intraretinal hyper-reflective material, pigment epithelial detachment, outer retinal atrophy (Stable  improvement in focal SRHM/IRHM; shallow central PED slightly increased in size).   Left Eye Quality was good. Central Foveal Thickness: 258. Progression has been stable. Findings include normal foveal contour, no IRF, no SRF, retinal drusen , epiretinal membrane, pigment epithelial detachment, outer retinal atrophy (Stable improvement in shallow SRF and low lying PED; patchy central ORA, partial PVD).   Notes *Images captured and stored on drive  Diagnosis / Impression:  OD: Stable improvement in focal SRHM/IRHM; shallow central PED slightly increased in size OS: Exudative ARMD -- stable improvement in shallow SRF and low lying PED; patchy central ORA, partial PVD  Clinical management:  See below  Abbreviations: NFP - Normal foveal profile. CME - cystoid macular edema. PED - pigment epithelial detachment. IRF - intraretinal fluid. SRF - subretinal fluid. EZ - ellipsoid zone. ERM - epiretinal membrane. ORA - outer retinal atrophy. ORT - outer retinal tubulation. SRHM - subretinal hyper-reflective material       Intravitreal Injection, Pharmacologic Agent - OD - Right Eye       Time Out 10/11/2023. 2:23 PM. Confirmed correct patient, procedure, site, and patient consented.   Anesthesia Topical anesthesia was used. Anesthetic medications included Lidocaine  2%, Proparacaine 0.5%.   Procedure Preparation included 5% betadine to ocular surface, eyelid speculum. A (32g) needle was used.   Injection: 1.25 mg Bevacizumab  1.25mg /0.55ml   Route: Intravitreal, Site: Right Eye   NDC: H525437, Lot: [id number], Expiration date: 12/22/2023   Post-op Post injection exam found visual acuity of at least counting fingers. The patient tolerated the procedure well. There were no complications. The patient received written and verbal post procedure care education.      Intravitreal Injection, Pharmacologic Agent - OS - Left Eye       Time Out 10/11/2023. 2:23 PM. Confirmed correct patient,  procedure, site, and patient consented.   Anesthesia Topical anesthesia was used. Anesthetic medications included Lidocaine  2%, Proparacaine 0.5%, Akten 3.5%.   Procedure Preparation included 5% betadine to ocular surface, eyelid speculum. A supplied (32g) needle was used.   Injection: 1.25 mg Bevacizumab  1.25mg /0.64ml   Route: Intravitreal, Site: Left Eye   NDC: 49757-939-98, Lot: [id number]$ , Expiration date: 10/28/2023   Post-op Post injection exam found visual acuity of at least counting fingers. The patient tolerated the procedure well. There were no complications. The patient received written and verbal post procedure care education. Post injection medications were not given.             ASSESSMENT/PLAN:    ICD-10-CM   1. Exudative age-related macular degeneration of left eye with active choroidal neovascularization (HCC)  H35.3221 OCT, Retina - OU - Both Eyes    Intravitreal Injection, Pharmacologic Agent - OS - Left Eye    Bevacizumab  (AVASTIN ) SOLN 1.25 mg    2. Exudative age-related macular degeneration of right eye with active choroidal neovascularization (HCC)  H35.3211 Intravitreal Injection, Pharmacologic Agent - OD - Right Eye    Bevacizumab  (AVASTIN ) SOLN 1.25 mg    3. Diabetes mellitus type 2 without retinopathy (HCC)  E11.9     4. Long term (current) use of oral hypoglycemic drugs  Z79.84  5. Essential hypertension  I10     6. Hypertensive retinopathy of both eyes  H35.033     7. Pseudophakia of both eyes  Z96.1     8. Dry eyes  H04.123      1. Exudative age-related macular degeneration, left eye - conversion of OS from nonexudative to exudative ARMD prior to 02.05.20 visit - s/p IVA OS #1 (02.05.20), #2 (03.04.20), #3 (04.30.20), #4 (06.02.20), #5 (07.14.20), #6 (09.1.20), #7 (10.27.20), #8 (12.23.20), #9 (02.03.21), #10 (03.17.21), #11 (04.28.21), #12 (06.09.21), #13 (08.04.21), #14 (09.29.21), #15 (11.22.2021), #16 (01.26.22), #17 (04.06.22), #18  (06.28.22), #19 (09.13.22), #20 (11.29.22), #21 (01.31.23), #22 (04.04.23), #23 (05.30.23), #24 (07.27.23), #25 (09.19.23), #26 (11.07.23), #27 (12.29.23), #28 (02.14.24), #29 (06.04.24), #30 (07.16.24), #31 (08.27.24), #32 (01.21.25), #33 (02.26.25), #34 (04.09.25), #35 (06.17.25), #36 (07.29.25) ================== - s/p IVE OS #1 (10.01.24), #2 (11.08.24), #3 (12.10.24) **history of recurrent / worsening SRF at 9 wk interval, noted on 04.04.23 visit, at 8 week interval on 09.19.23 and 06.04.24** - OCT shows OS: stable improvement in shallow SRF and low lying PED; patchy central ORA, partial PVD at 7 weeks  - BCVA OS 20/40 from 20/50 - recommend IVA OS #37 today, 09.16.25 w/ f/u in 6 wks -- [employer] funding unavailable  - pt wishes to proceed with IVA injection - RBA of procedure discussed, questions answered  - see procedure notes - Avastin  informed consent obtained, signed and scanned 01.21.25 (OU)  - Eylea  approved for 2025 -- but no funding for [employer]  - f/u in 6 wks for DFE, OCT, possible injection OS   2. Exudative age-related macular degeneration, right eye  - conversion to exu ARMD on 06.04.24 exam - s/p IVA OD #1 (06.04.24), #2 (07.16.24), #3 (08.27.24), #4 (01.21.25), #5 (02.26.25), #6 (04.09.25), #7 (06.17.25), #8 (07.29.25)  ================  - s/p IVE OD #1 (10.01.24), #2 (11.08.24), #3 (12.10.24)  - BCVA OD 20/40 - stable - OCT shows OD: Stable improvement in focal SRHM/IRHM, shallow central PED slightly increased in size at 7 weeks ** h/o of worsening at 7 weeks on 09.15.25** - recommend IVA OD #9 today, 09.16.25 with follow up 6 weeks  - pt wishes to proceed with injection  - RBA of procedure discussed, questions answered - IVA informed consent obtained and signed, 01.21.25 (OU) - see procedure note  - f/u in 6 wks, DFE, OCT, possible injxn OD  3,4. Diabetes mellitus, type 2 without retinopathy - The incidence, risk factors for progression, natural history and  treatment options for diabetic retinopathy  were discussed with patient.   - The need for close monitoring of blood glucose, blood pressure, and serum lipids, avoiding cigarette or any type of tobacco, and the need for long term follow up was also discussed with patient.  - f/u in 1 year  5,6. Hypertensive retinopathy OU  - discussed importance of tight BP control  - continue to monitor  7. Pseudophakia OU  - s/p CE/IOL OU  - beautiful surgeries, doing well  - continue to monitor  8. Dry eyes OU  - pt on restasis  BID OU - recommend artificial tears and lubricating ointment as needed  Ophthalmic Meds Ordered this visit:  Meds ordered this encounter  Medications   Bevacizumab  (AVASTIN ) SOLN 1.25 mg   Bevacizumab  (AVASTIN ) SOLN 1.25 mg    Return in about 6 weeks (around 11/22/2023) for f/u, Ex. AMD, DFE, OCT, Possible, IVA, OU.  There are no Patient Instructions on file for this visit.  This document serves as a record of services personally performed by Redell JUDITHANN Hans, MD, PhD. It was created on their behalf by Wanda GEANNIE Keens, COT an ophthalmic technician. The creation of this record is the provider's dictation and/or activities during the visit.    Electronically signed by:  Wanda GEANNIE Keens, COT  10/18/23 3:58 PM  This document serves as a record of services personally performed by Redell JUDITHANN Hans, MD, PhD. It was created on their behalf by Almetta Pesa, an ophthalmic technician. The creation of this record is the provider's dictation and/or activities during the visit.    Electronically signed by: Almetta Pesa, OA, 10/18/23  3:58 PM  Redell JUDITHANN Hans, M.D., Ph.D. Diseases & Surgery of the Retina and Vitreous [facility]  I have reviewed the above documentation for accuracy and completeness, and I agree with the above. Redell JUDITHANN Hans, M.D., Ph.D. 10/18/23 4:00 PM   Abbreviations: M myopia (nearsighted); A astigmatism; H hyperopia  (farsighted); P presbyopia; Mrx spectacle prescription;  CTL contact lenses; OD right eye; OS left eye; OU both eyes  XT exotropia; ET esotropia; PEK punctate epithelial keratitis; PEE punctate epithelial erosions; DES dry eye syndrome; MGD meibomian gland dysfunction; ATs artificial tears; PFAT's preservative free artificial tears; NSC nuclear sclerotic cataract; PSC posterior subcapsular cataract; ERM epi-retinal membrane; PVD posterior vitreous detachment; RD retinal detachment; DM diabetes mellitus; DR diabetic retinopathy; NPDR non-proliferative diabetic retinopathy; PDR proliferative diabetic retinopathy; CSME clinically significant macular edema; DME diabetic macular edema; dbh dot blot hemorrhages; CWS cotton wool spot; POAG primary open angle glaucoma; C/D cup-to-disc ratio; HVF humphrey visual field; GVF goldmann visual field; OCT optical coherence tomography; IOP intraocular pressure; BRVO Branch retinal vein occlusion; CRVO central retinal vein occlusion; CRAO central retinal artery occlusion; BRAO branch retinal artery occlusion; RT retinal tear; SB scleral buckle; PPV pars plana vitrectomy; VH Vitreous hemorrhage; PRP panretinal laser photocoagulation; IVK intravitreal kenalog; VMT vitreomacular traction; MH Macular hole;  NVD neovascularization of the disc; NVE neovascularization elsewhere; AREDS age related eye disease study; ARMD age related macular degeneration; POAG primary open angle glaucoma; EBMD epithelial/anterior basement membrane dystrophy; ACIOL anterior chamber intraocular lens; IOL intraocular lens; PCIOL posterior chamber intraocular lens; Phaco/IOL phacoemulsification with intraocular lens placement; PRK photorefractive keratectomy; LASIK laser assisted in situ keratomileusis; HTN hypertension; DM diabetes mellitus; COPD chronic obstructive pulmonary disease

## 2023-09-30 DIAGNOSIS — Z89421 Acquired absence of other right toe(s): Secondary | ICD-10-CM | POA: Diagnosis not present

## 2023-09-30 DIAGNOSIS — I129 Hypertensive chronic kidney disease with stage 1 through stage 4 chronic kidney disease, or unspecified chronic kidney disease: Secondary | ICD-10-CM | POA: Diagnosis not present

## 2023-09-30 DIAGNOSIS — Z556 Problems related to health literacy: Secondary | ICD-10-CM | POA: Diagnosis not present

## 2023-09-30 DIAGNOSIS — E1122 Type 2 diabetes mellitus with diabetic chronic kidney disease: Secondary | ICD-10-CM | POA: Diagnosis not present

## 2023-09-30 DIAGNOSIS — M159 Polyosteoarthritis, unspecified: Secondary | ICD-10-CM | POA: Diagnosis not present

## 2023-09-30 DIAGNOSIS — Z9181 History of falling: Secondary | ICD-10-CM | POA: Diagnosis not present

## 2023-09-30 DIAGNOSIS — S838X1D Sprain of other specified parts of right knee, subsequent encounter: Secondary | ICD-10-CM | POA: Diagnosis not present

## 2023-09-30 DIAGNOSIS — E1165 Type 2 diabetes mellitus with hyperglycemia: Secondary | ICD-10-CM | POA: Diagnosis not present

## 2023-09-30 DIAGNOSIS — D508 Other iron deficiency anemias: Secondary | ICD-10-CM | POA: Diagnosis not present

## 2023-09-30 DIAGNOSIS — Z794 Long term (current) use of insulin: Secondary | ICD-10-CM | POA: Diagnosis not present

## 2023-09-30 DIAGNOSIS — N1832 Chronic kidney disease, stage 3b: Secondary | ICD-10-CM | POA: Diagnosis not present

## 2023-09-30 DIAGNOSIS — M8580 Other specified disorders of bone density and structure, unspecified site: Secondary | ICD-10-CM | POA: Diagnosis not present

## 2023-09-30 DIAGNOSIS — E782 Mixed hyperlipidemia: Secondary | ICD-10-CM | POA: Diagnosis not present

## 2023-10-03 DIAGNOSIS — E1165 Type 2 diabetes mellitus with hyperglycemia: Secondary | ICD-10-CM | POA: Diagnosis not present

## 2023-10-03 DIAGNOSIS — R0989 Other specified symptoms and signs involving the circulatory and respiratory systems: Secondary | ICD-10-CM | POA: Diagnosis not present

## 2023-10-03 DIAGNOSIS — I129 Hypertensive chronic kidney disease with stage 1 through stage 4 chronic kidney disease, or unspecified chronic kidney disease: Secondary | ICD-10-CM | POA: Diagnosis not present

## 2023-10-03 DIAGNOSIS — N1832 Chronic kidney disease, stage 3b: Secondary | ICD-10-CM | POA: Diagnosis not present

## 2023-10-03 DIAGNOSIS — R296 Repeated falls: Secondary | ICD-10-CM | POA: Diagnosis not present

## 2023-10-03 DIAGNOSIS — E782 Mixed hyperlipidemia: Secondary | ICD-10-CM | POA: Diagnosis not present

## 2023-10-03 DIAGNOSIS — Z23 Encounter for immunization: Secondary | ICD-10-CM | POA: Diagnosis not present

## 2023-10-04 DIAGNOSIS — S838X1D Sprain of other specified parts of right knee, subsequent encounter: Secondary | ICD-10-CM | POA: Diagnosis not present

## 2023-10-04 DIAGNOSIS — E782 Mixed hyperlipidemia: Secondary | ICD-10-CM | POA: Diagnosis not present

## 2023-10-04 DIAGNOSIS — N1832 Chronic kidney disease, stage 3b: Secondary | ICD-10-CM | POA: Diagnosis not present

## 2023-10-04 DIAGNOSIS — I129 Hypertensive chronic kidney disease with stage 1 through stage 4 chronic kidney disease, or unspecified chronic kidney disease: Secondary | ICD-10-CM | POA: Diagnosis not present

## 2023-10-04 DIAGNOSIS — Z556 Problems related to health literacy: Secondary | ICD-10-CM | POA: Diagnosis not present

## 2023-10-04 DIAGNOSIS — Z89421 Acquired absence of other right toe(s): Secondary | ICD-10-CM | POA: Diagnosis not present

## 2023-10-04 DIAGNOSIS — E1165 Type 2 diabetes mellitus with hyperglycemia: Secondary | ICD-10-CM | POA: Diagnosis not present

## 2023-10-04 DIAGNOSIS — Z9181 History of falling: Secondary | ICD-10-CM | POA: Diagnosis not present

## 2023-10-04 DIAGNOSIS — Z794 Long term (current) use of insulin: Secondary | ICD-10-CM | POA: Diagnosis not present

## 2023-10-04 DIAGNOSIS — D508 Other iron deficiency anemias: Secondary | ICD-10-CM | POA: Diagnosis not present

## 2023-10-04 DIAGNOSIS — M159 Polyosteoarthritis, unspecified: Secondary | ICD-10-CM | POA: Diagnosis not present

## 2023-10-04 DIAGNOSIS — M8580 Other specified disorders of bone density and structure, unspecified site: Secondary | ICD-10-CM | POA: Diagnosis not present

## 2023-10-04 DIAGNOSIS — E1122 Type 2 diabetes mellitus with diabetic chronic kidney disease: Secondary | ICD-10-CM | POA: Diagnosis not present

## 2023-10-06 DIAGNOSIS — Z556 Problems related to health literacy: Secondary | ICD-10-CM | POA: Diagnosis not present

## 2023-10-06 DIAGNOSIS — Z89421 Acquired absence of other right toe(s): Secondary | ICD-10-CM | POA: Diagnosis not present

## 2023-10-06 DIAGNOSIS — M159 Polyosteoarthritis, unspecified: Secondary | ICD-10-CM | POA: Diagnosis not present

## 2023-10-06 DIAGNOSIS — Z794 Long term (current) use of insulin: Secondary | ICD-10-CM | POA: Diagnosis not present

## 2023-10-06 DIAGNOSIS — E782 Mixed hyperlipidemia: Secondary | ICD-10-CM | POA: Diagnosis not present

## 2023-10-06 DIAGNOSIS — S838X1D Sprain of other specified parts of right knee, subsequent encounter: Secondary | ICD-10-CM | POA: Diagnosis not present

## 2023-10-06 DIAGNOSIS — N1832 Chronic kidney disease, stage 3b: Secondary | ICD-10-CM | POA: Diagnosis not present

## 2023-10-06 DIAGNOSIS — I129 Hypertensive chronic kidney disease with stage 1 through stage 4 chronic kidney disease, or unspecified chronic kidney disease: Secondary | ICD-10-CM | POA: Diagnosis not present

## 2023-10-06 DIAGNOSIS — D508 Other iron deficiency anemias: Secondary | ICD-10-CM | POA: Diagnosis not present

## 2023-10-06 DIAGNOSIS — E1165 Type 2 diabetes mellitus with hyperglycemia: Secondary | ICD-10-CM | POA: Diagnosis not present

## 2023-10-06 DIAGNOSIS — E1122 Type 2 diabetes mellitus with diabetic chronic kidney disease: Secondary | ICD-10-CM | POA: Diagnosis not present

## 2023-10-06 DIAGNOSIS — Z9181 History of falling: Secondary | ICD-10-CM | POA: Diagnosis not present

## 2023-10-06 DIAGNOSIS — M8580 Other specified disorders of bone density and structure, unspecified site: Secondary | ICD-10-CM | POA: Diagnosis not present

## 2023-10-10 DIAGNOSIS — E1165 Type 2 diabetes mellitus with hyperglycemia: Secondary | ICD-10-CM | POA: Diagnosis not present

## 2023-10-10 DIAGNOSIS — D508 Other iron deficiency anemias: Secondary | ICD-10-CM | POA: Diagnosis not present

## 2023-10-10 DIAGNOSIS — N1832 Chronic kidney disease, stage 3b: Secondary | ICD-10-CM | POA: Diagnosis not present

## 2023-10-10 DIAGNOSIS — M8580 Other specified disorders of bone density and structure, unspecified site: Secondary | ICD-10-CM | POA: Diagnosis not present

## 2023-10-10 DIAGNOSIS — Z9181 History of falling: Secondary | ICD-10-CM | POA: Diagnosis not present

## 2023-10-10 DIAGNOSIS — E1122 Type 2 diabetes mellitus with diabetic chronic kidney disease: Secondary | ICD-10-CM | POA: Diagnosis not present

## 2023-10-10 DIAGNOSIS — I129 Hypertensive chronic kidney disease with stage 1 through stage 4 chronic kidney disease, or unspecified chronic kidney disease: Secondary | ICD-10-CM | POA: Diagnosis not present

## 2023-10-10 DIAGNOSIS — E782 Mixed hyperlipidemia: Secondary | ICD-10-CM | POA: Diagnosis not present

## 2023-10-10 DIAGNOSIS — Z556 Problems related to health literacy: Secondary | ICD-10-CM | POA: Diagnosis not present

## 2023-10-10 DIAGNOSIS — Z89421 Acquired absence of other right toe(s): Secondary | ICD-10-CM | POA: Diagnosis not present

## 2023-10-10 DIAGNOSIS — M159 Polyosteoarthritis, unspecified: Secondary | ICD-10-CM | POA: Diagnosis not present

## 2023-10-10 DIAGNOSIS — Z794 Long term (current) use of insulin: Secondary | ICD-10-CM | POA: Diagnosis not present

## 2023-10-11 ENCOUNTER — Encounter (INDEPENDENT_AMBULATORY_CARE_PROVIDER_SITE_OTHER): Payer: Self-pay | Admitting: Ophthalmology

## 2023-10-11 ENCOUNTER — Ambulatory Visit (INDEPENDENT_AMBULATORY_CARE_PROVIDER_SITE_OTHER): Admitting: Ophthalmology

## 2023-10-11 DIAGNOSIS — H353221 Exudative age-related macular degeneration, left eye, with active choroidal neovascularization: Secondary | ICD-10-CM

## 2023-10-11 DIAGNOSIS — E119 Type 2 diabetes mellitus without complications: Secondary | ICD-10-CM | POA: Diagnosis not present

## 2023-10-11 DIAGNOSIS — H35033 Hypertensive retinopathy, bilateral: Secondary | ICD-10-CM

## 2023-10-11 DIAGNOSIS — Z7984 Long term (current) use of oral hypoglycemic drugs: Secondary | ICD-10-CM | POA: Diagnosis not present

## 2023-10-11 DIAGNOSIS — Z961 Presence of intraocular lens: Secondary | ICD-10-CM

## 2023-10-11 DIAGNOSIS — H353231 Exudative age-related macular degeneration, bilateral, with active choroidal neovascularization: Secondary | ICD-10-CM

## 2023-10-11 DIAGNOSIS — I10 Essential (primary) hypertension: Secondary | ICD-10-CM

## 2023-10-11 DIAGNOSIS — H04123 Dry eye syndrome of bilateral lacrimal glands: Secondary | ICD-10-CM | POA: Diagnosis not present

## 2023-10-11 DIAGNOSIS — H353211 Exudative age-related macular degeneration, right eye, with active choroidal neovascularization: Secondary | ICD-10-CM

## 2023-10-13 DIAGNOSIS — E1122 Type 2 diabetes mellitus with diabetic chronic kidney disease: Secondary | ICD-10-CM | POA: Diagnosis not present

## 2023-10-13 DIAGNOSIS — Z556 Problems related to health literacy: Secondary | ICD-10-CM | POA: Diagnosis not present

## 2023-10-13 DIAGNOSIS — S838X1D Sprain of other specified parts of right knee, subsequent encounter: Secondary | ICD-10-CM | POA: Diagnosis not present

## 2023-10-13 DIAGNOSIS — M159 Polyosteoarthritis, unspecified: Secondary | ICD-10-CM | POA: Diagnosis not present

## 2023-10-13 DIAGNOSIS — Z89421 Acquired absence of other right toe(s): Secondary | ICD-10-CM | POA: Diagnosis not present

## 2023-10-13 DIAGNOSIS — E782 Mixed hyperlipidemia: Secondary | ICD-10-CM | POA: Diagnosis not present

## 2023-10-13 DIAGNOSIS — Z9181 History of falling: Secondary | ICD-10-CM | POA: Diagnosis not present

## 2023-10-13 DIAGNOSIS — D508 Other iron deficiency anemias: Secondary | ICD-10-CM | POA: Diagnosis not present

## 2023-10-13 DIAGNOSIS — M8580 Other specified disorders of bone density and structure, unspecified site: Secondary | ICD-10-CM | POA: Diagnosis not present

## 2023-10-13 DIAGNOSIS — E1165 Type 2 diabetes mellitus with hyperglycemia: Secondary | ICD-10-CM | POA: Diagnosis not present

## 2023-10-13 DIAGNOSIS — Z794 Long term (current) use of insulin: Secondary | ICD-10-CM | POA: Diagnosis not present

## 2023-10-13 DIAGNOSIS — I129 Hypertensive chronic kidney disease with stage 1 through stage 4 chronic kidney disease, or unspecified chronic kidney disease: Secondary | ICD-10-CM | POA: Diagnosis not present

## 2023-10-13 DIAGNOSIS — N1832 Chronic kidney disease, stage 3b: Secondary | ICD-10-CM | POA: Diagnosis not present

## 2023-10-17 DIAGNOSIS — Z89421 Acquired absence of other right toe(s): Secondary | ICD-10-CM | POA: Diagnosis not present

## 2023-10-17 DIAGNOSIS — Z556 Problems related to health literacy: Secondary | ICD-10-CM | POA: Diagnosis not present

## 2023-10-17 DIAGNOSIS — Z9181 History of falling: Secondary | ICD-10-CM | POA: Diagnosis not present

## 2023-10-17 DIAGNOSIS — M159 Polyosteoarthritis, unspecified: Secondary | ICD-10-CM | POA: Diagnosis not present

## 2023-10-17 DIAGNOSIS — E782 Mixed hyperlipidemia: Secondary | ICD-10-CM | POA: Diagnosis not present

## 2023-10-17 DIAGNOSIS — S838X1D Sprain of other specified parts of right knee, subsequent encounter: Secondary | ICD-10-CM | POA: Diagnosis not present

## 2023-10-17 DIAGNOSIS — Z794 Long term (current) use of insulin: Secondary | ICD-10-CM | POA: Diagnosis not present

## 2023-10-17 DIAGNOSIS — N1832 Chronic kidney disease, stage 3b: Secondary | ICD-10-CM | POA: Diagnosis not present

## 2023-10-17 DIAGNOSIS — I129 Hypertensive chronic kidney disease with stage 1 through stage 4 chronic kidney disease, or unspecified chronic kidney disease: Secondary | ICD-10-CM | POA: Diagnosis not present

## 2023-10-17 DIAGNOSIS — M8580 Other specified disorders of bone density and structure, unspecified site: Secondary | ICD-10-CM | POA: Diagnosis not present

## 2023-10-17 DIAGNOSIS — E1122 Type 2 diabetes mellitus with diabetic chronic kidney disease: Secondary | ICD-10-CM | POA: Diagnosis not present

## 2023-10-17 DIAGNOSIS — D508 Other iron deficiency anemias: Secondary | ICD-10-CM | POA: Diagnosis not present

## 2023-10-17 DIAGNOSIS — E1165 Type 2 diabetes mellitus with hyperglycemia: Secondary | ICD-10-CM | POA: Diagnosis not present

## 2023-10-18 ENCOUNTER — Encounter (INDEPENDENT_AMBULATORY_CARE_PROVIDER_SITE_OTHER): Payer: Self-pay | Admitting: Ophthalmology

## 2023-10-18 MED ORDER — BEVACIZUMAB CHEMO INJECTION 1.25MG/0.05ML SYRINGE FOR KALEIDOSCOPE
1.2500 mg | INTRAVITREAL | Status: AC | PRN
  Administered 2023-10-18: 1.25 mg via INTRAVITREAL

## 2023-10-19 DIAGNOSIS — I129 Hypertensive chronic kidney disease with stage 1 through stage 4 chronic kidney disease, or unspecified chronic kidney disease: Secondary | ICD-10-CM | POA: Diagnosis not present

## 2023-10-19 DIAGNOSIS — Z794 Long term (current) use of insulin: Secondary | ICD-10-CM | POA: Diagnosis not present

## 2023-10-19 DIAGNOSIS — D508 Other iron deficiency anemias: Secondary | ICD-10-CM | POA: Diagnosis not present

## 2023-10-19 DIAGNOSIS — M159 Polyosteoarthritis, unspecified: Secondary | ICD-10-CM | POA: Diagnosis not present

## 2023-10-19 DIAGNOSIS — M8580 Other specified disorders of bone density and structure, unspecified site: Secondary | ICD-10-CM | POA: Diagnosis not present

## 2023-10-19 DIAGNOSIS — Z89421 Acquired absence of other right toe(s): Secondary | ICD-10-CM | POA: Diagnosis not present

## 2023-10-19 DIAGNOSIS — E1165 Type 2 diabetes mellitus with hyperglycemia: Secondary | ICD-10-CM | POA: Diagnosis not present

## 2023-10-19 DIAGNOSIS — Z9181 History of falling: Secondary | ICD-10-CM | POA: Diagnosis not present

## 2023-10-19 DIAGNOSIS — N1832 Chronic kidney disease, stage 3b: Secondary | ICD-10-CM | POA: Diagnosis not present

## 2023-10-19 DIAGNOSIS — E782 Mixed hyperlipidemia: Secondary | ICD-10-CM | POA: Diagnosis not present

## 2023-10-19 DIAGNOSIS — Z556 Problems related to health literacy: Secondary | ICD-10-CM | POA: Diagnosis not present

## 2023-10-19 DIAGNOSIS — S838X1D Sprain of other specified parts of right knee, subsequent encounter: Secondary | ICD-10-CM | POA: Diagnosis not present

## 2023-10-19 DIAGNOSIS — E1122 Type 2 diabetes mellitus with diabetic chronic kidney disease: Secondary | ICD-10-CM | POA: Diagnosis not present

## 2023-10-24 DIAGNOSIS — Z794 Long term (current) use of insulin: Secondary | ICD-10-CM | POA: Diagnosis not present

## 2023-10-24 DIAGNOSIS — Z556 Problems related to health literacy: Secondary | ICD-10-CM | POA: Diagnosis not present

## 2023-10-24 DIAGNOSIS — E1122 Type 2 diabetes mellitus with diabetic chronic kidney disease: Secondary | ICD-10-CM | POA: Diagnosis not present

## 2023-10-24 DIAGNOSIS — M8580 Other specified disorders of bone density and structure, unspecified site: Secondary | ICD-10-CM | POA: Diagnosis not present

## 2023-10-24 DIAGNOSIS — D508 Other iron deficiency anemias: Secondary | ICD-10-CM | POA: Diagnosis not present

## 2023-10-24 DIAGNOSIS — Z9181 History of falling: Secondary | ICD-10-CM | POA: Diagnosis not present

## 2023-10-24 DIAGNOSIS — Z89421 Acquired absence of other right toe(s): Secondary | ICD-10-CM | POA: Diagnosis not present

## 2023-10-24 DIAGNOSIS — I129 Hypertensive chronic kidney disease with stage 1 through stage 4 chronic kidney disease, or unspecified chronic kidney disease: Secondary | ICD-10-CM | POA: Diagnosis not present

## 2023-10-24 DIAGNOSIS — M159 Polyosteoarthritis, unspecified: Secondary | ICD-10-CM | POA: Diagnosis not present

## 2023-10-24 DIAGNOSIS — N1832 Chronic kidney disease, stage 3b: Secondary | ICD-10-CM | POA: Diagnosis not present

## 2023-10-24 DIAGNOSIS — S838X1D Sprain of other specified parts of right knee, subsequent encounter: Secondary | ICD-10-CM | POA: Diagnosis not present

## 2023-10-24 DIAGNOSIS — E782 Mixed hyperlipidemia: Secondary | ICD-10-CM | POA: Diagnosis not present

## 2023-10-24 DIAGNOSIS — E1165 Type 2 diabetes mellitus with hyperglycemia: Secondary | ICD-10-CM | POA: Diagnosis not present

## 2023-10-28 DIAGNOSIS — E1122 Type 2 diabetes mellitus with diabetic chronic kidney disease: Secondary | ICD-10-CM | POA: Diagnosis not present

## 2023-10-28 DIAGNOSIS — N1832 Chronic kidney disease, stage 3b: Secondary | ICD-10-CM | POA: Diagnosis not present

## 2023-10-28 DIAGNOSIS — S838X1D Sprain of other specified parts of right knee, subsequent encounter: Secondary | ICD-10-CM | POA: Diagnosis not present

## 2023-10-28 DIAGNOSIS — I129 Hypertensive chronic kidney disease with stage 1 through stage 4 chronic kidney disease, or unspecified chronic kidney disease: Secondary | ICD-10-CM | POA: Diagnosis not present

## 2023-11-10 NOTE — Progress Notes (Signed)
 [facility] - Clinic Note  11/22/2023   CHIEF COMPLAINT Patient presents for Retina Follow Up  HISTORY OF PRESENT ILLNESS: Sue [name] is a 89 y.o. female who presents to the clinic today for:  HPI     Retina Follow Up   Patient presents with  Wet AMD.  In both eyes.  This started 6 weeks ago.  I, the attending physician,  performed the HPI with the patient and updated documentation appropriately.        Comments   Patient here for 6 weeks retina follow up for exu ARMD OU. Patient states vision doing pretty good. No eye pain. Using drops Retasis ans AT's.      Last edited by Valdemar Rogue, MD on 11/22/2023  5:30 PM.     Patient feels the vision is doing well.   Referring physician: Verdia Lombard, MD [address] [place],  [place] [address]  HISTORICAL INFORMATION:   Selected notes from the MEDICAL RECORD NUMBER Referred by Dr. Medford Ferrier for concern of ARMD OU   CURRENT MEDICATIONS: Current Outpatient Medications (Ophthalmic Drugs)  Medication Sig   RESTASIS  0.05 % ophthalmic emulsion Place 1 drop into both eyes 2 (two) times daily.   Polyethyl Glycol-Propyl Glycol (SYSTANE) 0.4-0.3 % SOLN Place 1 drop into both eyes 3 (three) times daily as needed (dry eyes). (Patient not taking: Reported on 11/22/2023)   No current facility-administered medications for this visit. (Ophthalmic Drugs)   Current Outpatient Medications (Other)  Medication Sig   amLODipine  (NORVASC ) 10 MG tablet Take 1 tablet by mouth daily.   B-D ULTRAFINE III SHORT PEN 31G X 8 MM MISC USE AS DIRECTED ONCE DAILY SUBCUTANEOUSLY   Calcium  Citrate-Vitamin D  (CALCIUM  + D PO)    calcium -vitamin D  (OSCAL WITH D) 500-200 MG-UNIT tablet Take 1 tablet by mouth 2 (two) times daily.    Cyanocobalamin  (B-12 PO)    diphenhydramine-acetaminophen  (TYLENOL  PM) 25-500 MG TABS tablet Take 1 tablet by mouth at bedtime as needed (sleep).   gentamicin  cream (GARAMYCIN ) 0.1 %  APPLY TO ULCER RIGHT FOOT ONCE DAILY.   glucose blood test strip OneTouch Ultra Test strips  TEST ONCE A DAY ONCE A DAY FINGERSTICK 90   hydrALAZINE  (APRESOLINE ) 25 MG tablet Take 3 tablets (75 mg total) by mouth every 8 (eight) hours.   hydrochlorothiazide (HYDRODIURIL) 25 MG tablet Take 25 mg by mouth daily.   ibuprofen (ADVIL) 200 MG tablet Take 400 mg by mouth every 6 (six) hours as needed for moderate pain.   Insulin  Degludec (TRESIBA  FLEXTOUCH) 200 UNIT/ML SOPN Inject 36 Units into the skin at bedtime.   Insulin  Pen Needle (PEN NEEDLES) 32G X 4 MM MISC BD Ultra-Fine Nano Pen Needle 32 gauge x 5/32  USE AS DIRECTED DAILY   ketoconazole (NIZORAL) 2 % cream Apply topically.   Lancets (ONETOUCH ULTRASOFT) lancets OneTouch UltraSoft Lancets  USE TWICE DAILY   Melatonin 10 MG CAPS Take 10 mg by mouth at bedtime as needed (sleep).   Multiple Vitamins-Minerals (ADULT ONE DAILY GUMMIES) CHEW Chew 1 tablet by mouth 2 (two) times daily. Centrum   Multiple Vitamins-Minerals (AIRBORNE) TBEF Take 1 tablet by mouth daily as needed (immune support).   Multiple Vitamins-Minerals (CENTRUM SILVER  PO)    Multiple Vitamins-Minerals (PRESERVISION AREDS 2) CAPS Take 1 capsule by mouth 2 (two) times daily.   mupirocin  ointment (BACTROBAN ) 2 % Apply to right foot ulcer once daily.   nortriptyline  (PAMELOR ) 25 MG capsule Take  1 capsule (25 mg total) by mouth at bedtime.   Omega-3 Fatty Acids (FISH OIL) 1200 MG CAPS Take 1,200 mg by mouth daily with lunch.    omeprazole (PRILOSEC) 20 MG capsule Take 1 capsule by mouth daily.   ondansetron  (ZOFRAN ) 4 MG tablet Take 4 mg by mouth 3 (three) times daily as needed.   ONE TOUCH ULTRA TEST test strip    PRESCRIPTION MEDICATION by Intravitreal route every 30 (thirty) days. Ophthalmic injection at MD office in left eye   silver  sulfADIAZINE  (SILVADENE ) 1 % cream Apply pea-sized amount to wound daily.   simvastatin  (ZOCOR ) 20 MG tablet Take 1 tablet by mouth daily.    traMADol  (ULTRAM ) 50 MG tablet Take 1 tablet (50 mg total) by mouth 2 (two) times daily as needed.   Continuous Blood Gluc Sensor (FREESTYLE LIBRE 2 SENSOR) MISC See admin instructions. (Patient not taking: Reported on 11/22/2023)   Glucagon (GVOKE HYPOPEN 2-PACK) 0.5 MG/0.1ML SOAJ as needed for hypoglycemia (Patient not taking: Reported on 11/22/2023)   meloxicam  (MOBIC ) 7.5 MG tablet Take 1 tablet (7.5 mg total) by mouth daily as needed for pain. (Patient not taking: Reported on 11/22/2023)   vitamin B-12 (CYANOCOBALAMIN ) 1000 MCG tablet Take 1,000 mcg by mouth 2 (two) times daily.    Current Facility-Administered Medications (Other)  Medication Route   Bevacizumab  (AVASTIN ) SOLN 1.25 mg Intravitreal   Bevacizumab  (AVASTIN ) SOLN 1.25 mg Intravitreal   REVIEW OF SYSTEMS: ROS   Positive for: Neurological, Skin, Genitourinary, Musculoskeletal, Endocrine, Eyes Negative for: Constitutional, Gastrointestinal, HENT, Cardiovascular, Respiratory, Psychiatric, Allergic/Imm, Heme/Lymph Last edited by Orval Asberry RAMAN, COA on 11/22/2023  1:12 PM.     ALLERGIES No Known Allergies  PAST MEDICAL HISTORY Past Medical History:  Diagnosis Date   Anemia    Arthritis    Basal cell carcinoma 11/23/2017   left elbow, right neck TX CX3 5FU    Basal cell carcinoma 04/04/2019   infil-right sideburn (CX35FU)   Chronic kidney disease    stage 3 ckd stage 3 b per dr verdia 09-26-2019 note on chart   Complication of anesthesia    deifficulty waking up   COVID-19    Diabetic neuropathy (HCC)    Hyperlipidemia    Hypertension    Hypertensive retinopathy    OU   Infiltrative basal cell carcinoma (BCC) 04/22/2020   Right Zygomatic Area    Macular degeneration    OU   Nodular basal cell carcinoma (BCC) 04/22/2020   Right Anterior Neck   Pneumonia    Right carotid bruit per dr verdia 09-26-2019 note on chart   SCC (squamous cell carcinoma) 11/23/2017   right forehead TX CX3 5FU   SCCA  (squamous cell carcinoma) of skin 04/22/2020   Mid Parietal Scalp (in situ)   SCCA (squamous cell carcinoma) of skin 04/22/2020   Right Dorsal Hand (in situ)   Type 2 DM with CKD stage 3 and hypertension (HCC)    Past Surgical History:  Procedure Laterality Date   ABDOMINAL HYSTERECTOMY     AMPUTATION TOE Right 09/28/2019   Procedure: Right 2nd toe amputation - partial;  Surgeon: Gretel Ozell PARAS, DPM;  Location: [facility];  Service: Podiatry;  Laterality: Right;   CATARACT EXTRACTION     CHOLECYSTECTOMY     EYE SURGERY     HERNIA REPAIR     SPINE SURGERY     TONSILLECTOMY     FAMILY HISTORY History reviewed. No pertinent family history.  SOCIAL HISTORY Social  History   Tobacco Use   Smoking status: Former   Smokeless tobacco: Never  Vaping Use   Vaping status: Never Used  Substance Use Topics   Alcohol  use: No    Alcohol /week: 0.0 standard drinks of alcohol    Drug use: No       OPHTHALMIC EXAM:  Base Eye Exam     Visual Acuity (Snellen - Linear)       Right Left   Dist cc 20/50 -2 20/40 -1   Dist ph cc NI NI    Correction: Glasses         Tonometry (Tonopen, 1:10 PM)       Right Left   Pressure 18 16         Pupils       Dark Light Shape React APD   Right 3 2 Round Brisk None   Left 3 2 Round Brisk None         Visual Fields (Counting fingers)       Left Right    Full Full         Extraocular Movement       Right Left    Full, Ortho Full, Ortho         Neuro/Psych     Oriented x3: Yes   Mood/Affect: Normal         Dilation     Both eyes: 1.0% Mydriacyl, 2.5% Phenylephrine @ 1:10 PM           Slit Lamp and Fundus Exam     Slit Lamp Exam       Right Left   Lids/Lashes Dermatochalasis - upper lid, Telangiectasia, mild MGD Dermatochalasis - upper lid, Telangiectasia, Meibomian gland dysfunction   Conjunctiva/Sclera White and quiet White and quiet   Cornea Temporal Well healed cataract wounds, mild  Arcus, trace endo pigment nasally, 1+PEE Arcus, Temporal Well healed cataract wounds, 1-2+ PEE, tear film debris   Anterior Chamber Deep and quiet Deep and quiet   Iris Round and moderately dilated to 4.52mm Round and moderately dilated to 5mm, Iris atrophy from 0100 to 0230, peripheral iridotomy at 0200   Lens 3-piece PC IOL in good position PC IOL in good position   Anterior Vitreous Vitreous syneresis, Posterior vitreous detachment, Weiss ring, vitreous condensations Vitreous syneresis, PVD, vitreous condensations         Fundus Exam       Right Left   Disc 360 Peripapillary atrophy, diffuse mild pallor, compact, sharp rim mild pallor, Peripapillary atrophy and pigmentation   C/D Ratio 0.1 0.2   Macula Blunted foveal reflex, +PED / CNV, RPE mottling, clumping and early atrophy, Drusen, No heme Blunted foveal reflex, low lying PED / +CNVM with stable improvement in IRF and SRF, RPE mottling and clumping, Drusen, No heme, early atrophy   Vessels Attenuated, Tortuous Attenuated, mild tortuosity   Periphery Attached, 360 Reticular degeneration, No heme Attached, 360 Reticular degeneration, No heme           Refraction     Wearing Rx       Sphere Cylinder Axis Add   Right -2.00 +1.25 161 +2.50   Left +0.25 +0.50 004 +2.50           IMAGING AND PROCEDURES  Imaging and Procedures for @TODAY @  OCT, Retina - OU - Both Eyes       Right Eye Quality was good. Central Foveal Thickness: 215. Progression has been stable. Findings include no IRF, no SRF,  abnormal foveal contour, retinal drusen , subretinal hyper-reflective material, intraretinal hyper-reflective material, pigment epithelial detachment, outer retinal atrophy (Stable improvement in focal SRHM/IRHM; shallow central PED --persistent).   Left Eye Quality was good. Central Foveal Thickness: 247. Progression has been stable. Findings include normal foveal contour, no IRF, no SRF, retinal drusen , epiretinal membrane, pigment  epithelial detachment, outer retinal atrophy (Stable improvement in shallow SRF and low lying PED; patchy central ORA, partial PVD).   Notes *Images captured and stored on drive  Diagnosis / Impression:  OD: Stable improvement in focal SRHM/IRHM; shallow central PED--persistent OS: Exudative ARMD -- stable improvement in shallow SRF and low lying PED; patchy central ORA, partial PVD  Clinical management:  See below  Abbreviations: NFP - Normal foveal profile. CME - cystoid macular edema. PED - pigment epithelial detachment. IRF - intraretinal fluid. SRF - subretinal fluid. EZ - ellipsoid zone. ERM - epiretinal membrane. ORA - outer retinal atrophy. ORT - outer retinal tubulation. SRHM - subretinal hyper-reflective material       Intravitreal Injection, Pharmacologic Agent - OD - Right Eye       Time Out 11/22/2023. 1:56 PM. Confirmed correct patient, procedure, site, and patient consented.   Anesthesia Topical anesthesia was used. Anesthetic medications included Lidocaine  2%, Proparacaine 0.5%.   Procedure Preparation included 5% betadine to ocular surface, eyelid speculum. A supplied (32g) needle was used.   Injection: 1.25 mg Bevacizumab  1.25mg /0.30ml   Route: Intravitreal, Site: Right Eye   NDC: C2662926, Lot: [id number], Expiration date: 12/11/2023   Post-op Post injection exam found visual acuity of at least counting fingers. The patient tolerated the procedure well. There were no complications. The patient received written and verbal post procedure care education.      Intravitreal Injection, Pharmacologic Agent - OS - Left Eye       Time Out 11/22/2023. 1:56 PM. Confirmed correct patient, procedure, site, and patient consented.   Anesthesia Topical anesthesia was used. Anesthetic medications included Lidocaine  2%, Proparacaine 0.5%, Akten 3.5%.   Procedure Preparation included 5% betadine to ocular surface, eyelid speculum. A (32g) needle was used.    Injection: 1.25 mg Bevacizumab  1.25mg /0.18ml   Route: Intravitreal, Site: Left Eye   NDC: C2662926, Lot: [id number], Expiration date: 03/01/2024   Post-op Post injection exam found visual acuity of at least counting fingers. The patient tolerated the procedure well. There were no complications. The patient received written and verbal post procedure care education. Post injection medications were not given.            ASSESSMENT/PLAN:    ICD-10-CM   1. Exudative age-related macular degeneration of left eye with active choroidal neovascularization (HCC)  H35.3221 OCT, Retina - OU - Both Eyes    Intravitreal Injection, Pharmacologic Agent - OS - Left Eye    Bevacizumab  (AVASTIN ) SOLN 1.25 mg    2. Exudative age-related macular degeneration of right eye with active choroidal neovascularization (HCC)  H35.3211 Intravitreal Injection, Pharmacologic Agent - OD - Right Eye    Bevacizumab  (AVASTIN ) SOLN 1.25 mg    3. Diabetes mellitus type 2 without retinopathy (HCC)  E11.9     4. Long term (current) use of oral hypoglycemic drugs  Z79.84     5. Essential hypertension  I10     6. Hypertensive retinopathy of both eyes  H35.033     7. Pseudophakia of both eyes  Z96.1     8. Dry eyes  H04.123      1. Exudative age-related macular degeneration, left  eye - conversion of OS from nonexudative to exudative ARMD prior to 02.05.20 visit - s/p IVA OS #1 (02.05.20), #2 (03.04.20), #3 (04.30.20), #4 (06.02.20), #5 (07.14.20), #6 (09.1.20), #7 (10.27.20), #8 (12.23.20), #9 (02.03.21), #10 (03.17.21), #11 (04.28.21), #12 (06.09.21), #13 (08.04.21), #14 (09.29.21), #15 (11.22.2021), #16 (01.26.22), #17 (04.06.22), #18 (06.28.22), #19 (09.13.22), #20 (11.29.22), #21 (01.31.23), #22 (04.04.23), #23 (05.30.23), #24 (07.27.23), #25 (09.19.23), #26 (11.07.23), #27 (12.29.23), #28 (02.14.24), #29 (06.04.24), #30 (07.16.24), #31 (08.27.24), #32 (01.21.25), #33 (02.26.25), #34 (04.09.25), #35 (06.17.25), #36  (07.29.25), #37 (09.16.25) ================== - s/p IVE OS #1 (10.01.24), #2 (11.08.24), #3 (12.10.24) **history of recurrent / worsening SRF at 9 wk interval, noted on 04.04.23 visit, at 8 week interval on 09.19.23 and 06.04.24** - OCT shows OS: stable improvement in shallow SRF and low lying PED; patchy central ORA, partial PVD at 6 weeks  - BCVA OS 20/40 from 20/50 - recommend IVA OS #38 today, 10.28.25 w/ f/u in 6 wks -- [employer] funding unavailable  - pt wishes to proceed with IVA injection - RBA of procedure discussed, questions answered  - see procedure notes - Avastin  informed consent obtained, signed and scanned 01.21.25 (OU)  - Eylea  approved for 2025 -- but no funding for [employer]  - f/u in 6 wks for DFE, OCT, possible injection OS   2. Exudative age-related macular degeneration, right eye  - conversion to exu ARMD on 06.04.24 exam - s/p IVA OD #1 (06.04.24), #2 (07.16.24), #3 (08.27.24), #4 (01.21.25), #5 (02.26.25), #6 (04.09.25), #7 (06.17.25), #8 (07.29.25), #9 (09.16.25)  ================  - s/p IVE OD #1 (10.01.24), #2 (11.08.24), #3 (12.10.24)  - BCVA OD 20/40 - stable - OCT shows OD: Stable improvement in focal SRHM/IRHM, shallow central PED--persistent at 6  weeks ** h/o of worsening at 7 weeks on 09.15.25** - recommend IVA OD #10 today, 10.28.25 with follow up 6 weeks  - pt wishes to proceed with injection  - RBA of procedure discussed, questions answered - IVA informed consent obtained and signed, 01.21.25 (OU) - see procedure note  - f/u in 6 wks, DFE, OCT, possible injxn OD  3,4. Diabetes mellitus, type 2 without retinopathy - The incidence, risk factors for progression, natural history and treatment options for diabetic retinopathy  were discussed with patient.   - The need for close monitoring of blood glucose, blood pressure, and serum lipids, avoiding cigarette or any type of tobacco, and the need for long term follow up was also discussed with  patient.  - f/u in 1 year  5,6. Hypertensive retinopathy OU  - discussed importance of tight BP control  - continue to monitor  7. Pseudophakia OU  - s/p CE/IOL OU  - beautiful surgeries, doing well  - continue to monitor  8. Dry eyes OU  - pt on restasis  BID OU - recommend artificial tears and lubricating ointment as needed  Ophthalmic Meds Ordered this visit:  Meds ordered this encounter  Medications   Bevacizumab  (AVASTIN ) SOLN 1.25 mg   Bevacizumab  (AVASTIN ) SOLN 1.25 mg    Return in about 6 weeks (around 01/03/2024) for exu ARMD OU, DFE, OCT, Possible Injxn.  There are no Patient Instructions on file for this visit.   This document serves as a record of services personally performed by Redell JUDITHANN Hans, MD, PhD. It was created on their behalf by Wanda GEANNIE Keens, COT an ophthalmic technician. The creation of this record is the provider's dictation and/or activities during the visit.    Electronically  signed by:  Wanda GEANNIE Keens, COT  11/27/23 9:38 AM  This document serves as a record of services personally performed by Redell JUDITHANN Hans, MD, PhD. It was created on their behalf by Almetta Pesa, an ophthalmic technician. The creation of this record is the provider's dictation and/or activities during the visit.    Electronically signed by: Almetta Pesa, OA, 11/27/23  9:38 AM  Redell JUDITHANN Hans, M.D., Ph.D. Diseases & Surgery of the Retina and Vitreous [facility]  I have reviewed the above documentation for accuracy and completeness, and I agree with the above. Redell JUDITHANN Hans, M.D., Ph.D. 11/27/23 9:43 AM  Abbreviations: M myopia (nearsighted); A astigmatism; H hyperopia (farsighted); P presbyopia; Mrx spectacle prescription;  CTL contact lenses; OD right eye; OS left eye; OU both eyes  XT exotropia; ET esotropia; PEK punctate epithelial keratitis; PEE punctate epithelial erosions; DES dry eye syndrome; MGD meibomian gland dysfunction; ATs  artificial tears; PFAT's preservative free artificial tears; NSC nuclear sclerotic cataract; PSC posterior subcapsular cataract; ERM epi-retinal membrane; PVD posterior vitreous detachment; RD retinal detachment; DM diabetes mellitus; DR diabetic retinopathy; NPDR non-proliferative diabetic retinopathy; PDR proliferative diabetic retinopathy; CSME clinically significant macular edema; DME diabetic macular edema; dbh dot blot hemorrhages; CWS cotton wool spot; POAG primary open angle glaucoma; C/D cup-to-disc ratio; HVF humphrey visual field; GVF goldmann visual field; OCT optical coherence tomography; IOP intraocular pressure; BRVO Branch retinal vein occlusion; CRVO central retinal vein occlusion; CRAO central retinal artery occlusion; BRAO branch retinal artery occlusion; RT retinal tear; SB scleral buckle; PPV pars plana vitrectomy; VH Vitreous hemorrhage; PRP panretinal laser photocoagulation; IVK intravitreal kenalog; VMT vitreomacular traction; MH Macular hole;  NVD neovascularization of the disc; NVE neovascularization elsewhere; AREDS age related eye disease study; ARMD age related macular degeneration; POAG primary open angle glaucoma; EBMD epithelial/anterior basement membrane dystrophy; ACIOL anterior chamber intraocular lens; IOL intraocular lens; PCIOL posterior chamber intraocular lens; Phaco/IOL phacoemulsification with intraocular lens placement; PRK photorefractive keratectomy; LASIK laser assisted in situ keratomileusis; HTN hypertension; DM diabetes mellitus; COPD chronic obstructive pulmonary disease

## 2023-11-14 ENCOUNTER — Ambulatory Visit (INDEPENDENT_AMBULATORY_CARE_PROVIDER_SITE_OTHER): Admitting: Podiatry

## 2023-11-14 ENCOUNTER — Encounter: Payer: Self-pay | Admitting: Podiatry

## 2023-11-14 DIAGNOSIS — E1142 Type 2 diabetes mellitus with diabetic polyneuropathy: Secondary | ICD-10-CM

## 2023-11-14 DIAGNOSIS — M79674 Pain in right toe(s): Secondary | ICD-10-CM

## 2023-11-14 DIAGNOSIS — L84 Corns and callosities: Secondary | ICD-10-CM | POA: Diagnosis not present

## 2023-11-14 DIAGNOSIS — B351 Tinea unguium: Secondary | ICD-10-CM | POA: Diagnosis not present

## 2023-11-14 DIAGNOSIS — Z89421 Acquired absence of other right toe(s): Secondary | ICD-10-CM

## 2023-11-14 DIAGNOSIS — M79675 Pain in left toe(s): Secondary | ICD-10-CM

## 2023-11-20 NOTE — Progress Notes (Signed)
  Subjective:  Patient ID: Sue [name], female    DOB: Jun 06, 1932,  MRN: [id number]  Sue [name] presents to clinic today for at risk foot care. Patient has h/o NIDDM, neuropathy and PAD with amputation of partial amputation of right second digit and callus(es) right foot and painful mycotic toenails that are difficult to trim. Painful toenails interfere with ambulation. Aggravating factors include wearing enclosed shoe gear. Pain is relieved with periodic professional debridement. Painful calluses are aggravated when weightbearing with and without shoegear. Pain is relieved with periodic professional debridement.  Her daughter is present during today's visit. Chief Complaint  Patient presents with   Toe Pain    A1c is 8.8. Dr. Stark is her PCP. Last visit was 4 weeks ago   New problem(s): None.   PCP is Verdia Lombard, MD.  No Known Allergies  Review of Systems: Negative except as noted in the HPI.  Objective:  There were no vitals filed for this visit. Sue [name] is a pleasant 89 y.o. female in NAD. AAO x 3.  Vascular Examination: Capillary refill time immediate b/l. Vascular status intact b/l with palpable pedal pulses. Pedal hair present b/l. No pain with calf compression b/l. Skin temperature gradient WNL b/l. No cyanosis or clubbing b/l. No ischemia or gangrene noted b/l.   Neurological Examination: Pt has subjective symptoms of neuropathy. Protective sensation diminished with 10g monofilament b/l.  Dermatological Examination: Pedal skin with normal turgor, texture and tone b/l. No interdigital macerations.   Toenails 1-5 left, 1, 3-5 right elongated, discolored, dystrophic, thickened, and crumbly with subungual debris and tenderness to dorsal palpation.  Hyperkeratotic lesion noted submet head 2 right foot. There is visible subdermal hemorrhage. There is no surrounding erythema, no edema, no drainage, no odor, no fluctuance.  Musculoskeletal  Examination: Muscle strength 5/5 to all lower extremity muscle groups bilaterally. 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 Utilizes rollator for ambulation assistance.  Radiographs: None  Assessment/Plan: 1. Pain due to onychomycosis of toenails of both feet   2. Callus   3. History of partial amputation of toe of right foot   4. Diabetic peripheral neuropathy associated with type 2 diabetes mellitus (HCC)   Patient was evaluated and treated. All patient's and/or POA's questions/concerns addressed on today's visit. Toenails 1-5 left foot, 1, 3,-5 right debrided in length and girth without incident. Callus(es) submet head 2 right foot pared with sharp debridement without incident. Continue daily foot inspections and monitor blood glucose per PCP/Endocrinologist's recommendations. Continue soft, supportive shoe gear daily. Report any pedal injuries to medical professional. She appears to be doing well at 9 week intervals and they know to contact me if she has any issues in the interim. Call office if there are any questions/concerns.  Return in about 9 weeks (around 01/16/2024).  [name], DPM      [place] LOCATION: [address], [place] [address]                   Office [phone]   [facility] LOCATION: [address] [place], [place] [address] Office [phone]

## 2023-11-22 ENCOUNTER — Ambulatory Visit (INDEPENDENT_AMBULATORY_CARE_PROVIDER_SITE_OTHER): Admitting: Ophthalmology

## 2023-11-22 ENCOUNTER — Encounter (INDEPENDENT_AMBULATORY_CARE_PROVIDER_SITE_OTHER): Payer: Self-pay | Admitting: Ophthalmology

## 2023-11-22 DIAGNOSIS — E119 Type 2 diabetes mellitus without complications: Secondary | ICD-10-CM

## 2023-11-22 DIAGNOSIS — H04123 Dry eye syndrome of bilateral lacrimal glands: Secondary | ICD-10-CM

## 2023-11-22 DIAGNOSIS — I10 Essential (primary) hypertension: Secondary | ICD-10-CM | POA: Diagnosis not present

## 2023-11-22 DIAGNOSIS — Z7984 Long term (current) use of oral hypoglycemic drugs: Secondary | ICD-10-CM

## 2023-11-22 DIAGNOSIS — H353231 Exudative age-related macular degeneration, bilateral, with active choroidal neovascularization: Secondary | ICD-10-CM | POA: Diagnosis not present

## 2023-11-22 DIAGNOSIS — H35033 Hypertensive retinopathy, bilateral: Secondary | ICD-10-CM

## 2023-11-22 DIAGNOSIS — Z961 Presence of intraocular lens: Secondary | ICD-10-CM

## 2023-11-22 DIAGNOSIS — H353211 Exudative age-related macular degeneration, right eye, with active choroidal neovascularization: Secondary | ICD-10-CM

## 2023-11-22 DIAGNOSIS — H353221 Exudative age-related macular degeneration, left eye, with active choroidal neovascularization: Secondary | ICD-10-CM

## 2023-11-22 MED ORDER — BEVACIZUMAB CHEMO INJECTION 1.25MG/0.05ML SYRINGE FOR KALEIDOSCOPE
1.2500 mg | INTRAVITREAL | Status: AC | PRN
  Administered 2023-11-22: 1.25 mg via INTRAVITREAL

## 2023-12-20 NOTE — Progress Notes (Signed)
 [facility] - Clinic Note  01/03/2024   CHIEF COMPLAINT Patient presents for Retina Follow Up  HISTORY OF PRESENT ILLNESS: Sue [name] is a 89 y.o. female who presents to the clinic today for:  HPI     Retina Follow Up   Patient presents with  Wet AMD.  In both eyes.  This started 6 years ago.  Duration of 6 weeks.  I, the attending physician,  performed the HPI with the patient and updated documentation appropriately.        Comments   Patient here for 6 weeks retina follow up for exu ARMD OU. Pt denies any changes in [place]. Pt is using drops- Retasis and AT's. A1C: 7 (due again in January)  BSL: 89 (this morning)       Last edited by Valdemar Rogue, MD on 01/03/2024  8:59 PM.      Referring physician: Verdia Lombard, MD [address] [place],  [place] [address]  HISTORICAL INFORMATION:   Selected notes from the MEDICAL RECORD NUMBER Referred by Dr. Medford Ferrier for concern of ARMD OU   CURRENT MEDICATIONS: Current Outpatient Medications (Ophthalmic Drugs)  Medication Sig   Polyethyl Glycol-Propyl Glycol (SYSTANE) 0.4-0.3 % SOLN Place 1 drop into both eyes 3 (three) times daily as needed (dry eyes). (Patient not taking: Reported on 11/22/2023)   RESTASIS  0.05 % ophthalmic emulsion Place 1 drop into both eyes 2 (two) times daily.   No current facility-administered medications for this visit. (Ophthalmic Drugs)   Current Outpatient Medications (Other)  Medication Sig   amLODipine  (NORVASC ) 10 MG tablet Take 1 tablet by mouth daily.   B-D ULTRAFINE III SHORT PEN 31G X 8 MM MISC USE AS DIRECTED ONCE DAILY SUBCUTANEOUSLY   Calcium  Citrate-Vitamin D  (CALCIUM  + D PO)    calcium -vitamin D  (OSCAL WITH D) 500-200 MG-UNIT tablet Take 1 tablet by mouth 2 (two) times daily.    Continuous Blood Gluc Sensor (FREESTYLE LIBRE 2 SENSOR) MISC See admin instructions. (Patient not taking: Reported on 11/22/2023)   Cyanocobalamin  (B-12 PO)     diphenhydramine-acetaminophen  (TYLENOL  PM) 25-500 MG TABS tablet Take 1 tablet by mouth at bedtime as needed (sleep).   gentamicin  cream (GARAMYCIN ) 0.1 % APPLY TO ULCER RIGHT FOOT ONCE DAILY.   Glucagon (GVOKE HYPOPEN 2-PACK) 0.5 MG/0.1ML SOAJ as needed for hypoglycemia (Patient not taking: Reported on 11/22/2023)   glucose blood test strip OneTouch Ultra Test strips  TEST ONCE A DAY ONCE A DAY FINGERSTICK 90   hydrALAZINE  (APRESOLINE ) 25 MG tablet Take 3 tablets (75 mg total) by mouth every 8 (eight) hours.   hydrochlorothiazide (HYDRODIURIL) 25 MG tablet Take 25 mg by mouth daily.   ibuprofen (ADVIL) 200 MG tablet Take 400 mg by mouth every 6 (six) hours as needed for moderate pain.   Insulin  Degludec (TRESIBA  FLEXTOUCH) 200 UNIT/ML SOPN Inject 36 Units into the skin at bedtime.   Insulin  Pen Needle (PEN NEEDLES) 32G X 4 MM MISC BD Ultra-Fine Nano Pen Needle 32 gauge x 5/32  USE AS DIRECTED DAILY   ketoconazole (NIZORAL) 2 % cream Apply topically.   Lancets (ONETOUCH ULTRASOFT) lancets OneTouch UltraSoft Lancets  USE TWICE DAILY   Melatonin 10 MG CAPS Take 10 mg by mouth at bedtime as needed (sleep).   meloxicam  (MOBIC ) 7.5 MG tablet Take 1 tablet (7.5 mg total) by mouth daily as needed for pain. (Patient not taking: Reported on 11/22/2023)   Multiple Vitamins-Minerals (ADULT ONE DAILY GUMMIES) CHEW  Chew 1 tablet by mouth 2 (two) times daily. Centrum   Multiple Vitamins-Minerals (AIRBORNE) TBEF Take 1 tablet by mouth daily as needed (immune support).   Multiple Vitamins-Minerals (CENTRUM SILVER  PO)    Multiple Vitamins-Minerals (PRESERVISION AREDS 2) CAPS Take 1 capsule by mouth 2 (two) times daily.   mupirocin  ointment (BACTROBAN ) 2 % Apply to right foot ulcer once daily.   nortriptyline  (PAMELOR ) 25 MG capsule Take 1 capsule (25 mg total) by mouth at bedtime.   Omega-3 Fatty Acids (FISH OIL) 1200 MG CAPS Take 1,200 mg by mouth daily with lunch.    omeprazole (PRILOSEC) 20 MG capsule Take  1 capsule by mouth daily.   ondansetron  (ZOFRAN ) 4 MG tablet Take 4 mg by mouth 3 (three) times daily as needed.   ONE TOUCH ULTRA TEST test strip    PRESCRIPTION MEDICATION by Intravitreal route every 30 (thirty) days. Ophthalmic injection at MD office in left eye   silver  sulfADIAZINE  (SILVADENE ) 1 % cream Apply pea-sized amount to wound daily.   simvastatin  (ZOCOR ) 20 MG tablet Take 1 tablet by mouth daily.   traMADol  (ULTRAM ) 50 MG tablet Take 1 tablet (50 mg total) by mouth 2 (two) times daily as needed.   vitamin B-12 (CYANOCOBALAMIN ) 1000 MCG tablet Take 1,000 mcg by mouth 2 (two) times daily.    Current Facility-Administered Medications (Other)  Medication Route   Bevacizumab  (AVASTIN ) SOLN 1.25 mg Intravitreal   Bevacizumab  (AVASTIN ) SOLN 1.25 mg Intravitreal   REVIEW OF SYSTEMS:   ALLERGIES No Known Allergies  PAST MEDICAL HISTORY Past Medical History:  Diagnosis Date   Anemia    Arthritis    Basal cell carcinoma 11/23/2017   left elbow, right neck TX CX3 5FU    Basal cell carcinoma 04/04/2019   infil-right sideburn (CX35FU)   Chronic kidney disease    stage 3 ckd stage 3 b per dr verdia 09-26-2019 note on chart   Complication of anesthesia    deifficulty waking up   COVID-19    Diabetic neuropathy (HCC)    Hyperlipidemia    Hypertension    Hypertensive retinopathy    OU   Infiltrative basal cell carcinoma (BCC) 04/22/2020   Right Zygomatic Area    Macular degeneration    OU   Nodular basal cell carcinoma (BCC) 04/22/2020   Right Anterior Neck   Pneumonia    Right carotid bruit per dr verdia 09-26-2019 note on chart   SCC (squamous cell carcinoma) 11/23/2017   right forehead TX CX3 5FU   SCCA (squamous cell carcinoma) of skin 04/22/2020   Mid Parietal Scalp (in situ)   SCCA (squamous cell carcinoma) of skin 04/22/2020   Right Dorsal Hand (in situ)   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 No pertinent family history.  SOCIAL HISTORY Social History   Tobacco Use   Smoking status: Former   Smokeless tobacco: Never  Vaping Use   Vaping status: Never Used  Substance Use  Topics   Alcohol  use: No    Alcohol /week: 0.0 standard drinks of alcohol    Drug use: No       OPHTHALMIC EXAM:  Base Eye Exam     Visual Acuity (Snellen - Linear)       Right Left   Dist cc 20/40 -3 20/40 -3   Dist ph cc NI NI    Correction: Glasses         Tonometry (Tonopen, 1:50 PM)       Right Left   Pressure 18 13         Pupils       Dark Light Shape React APD   Right 3 2 Round Brisk None   Left 3 2 Round Brisk None         Visual Fields       Left Right    Full Full         Extraocular Movement       Right Left    Full, Ortho Full, Ortho         Neuro/Psych     Oriented x3: Yes   Mood/Affect: Normal         Dilation     Both eyes: 1.0% Mydriacyl, 2.5% Phenylephrine @ 1:50 PM           Slit Lamp and Fundus Exam     Slit Lamp Exam       Right Left   Lids/Lashes Dermatochalasis - upper lid, Telangiectasia, mild MGD Dermatochalasis - upper lid, Telangiectasia, Meibomian gland dysfunction   Conjunctiva/Sclera White and quiet White and quiet   Cornea Temporal Well healed cataract wounds, mild Arcus, trace endo pigment nasally, 1+PEE Arcus, Temporal Well healed cataract wounds, 1-2+ PEE, tear film debris   Anterior Chamber Deep and quiet Deep and quiet   Iris Round and moderately dilated to 4.47mm Round and moderately dilated to 5mm, Iris atrophy from 0100 to  0230, peripheral iridotomy at 0200   Lens 3-piece PC IOL in good position PC IOL in good position   Anterior Vitreous Vitreous syneresis, Posterior vitreous detachment, Weiss ring, vitreous condensations Vitreous syneresis, PVD, vitreous condensations         Fundus Exam       Right Left   Disc 360 Peripapillary atrophy, diffuse mild pallor, compact, sharp rim mild pallor, Peripapillary atrophy and pigmentation   C/D Ratio 0.1 0.2   Macula Blunted foveal reflex, +PED / CNV, RPE mottling, clumping and early atrophy, Drusen, No heme Blunted foveal reflex, low lying PED / +CNVM with stable improvement in IRF and SRF, RPE mottling and clumping, Drusen, No heme, early atrophy   Vessels Attenuated, Tortuous Attenuated, mild tortuosity   Periphery Attached, 360 Reticular degeneration, No heme Attached, 360 Reticular degeneration, No heme           Refraction     Wearing Rx       Sphere Cylinder Axis Add   Right -2.00 +1.25 161 +2.50   Left +0.25 +0.50 004 +2.50           IMAGING AND PROCEDURES  Imaging and Procedures for @TODAY @  OCT, Retina - OU - Both Eyes       Right Eye Quality was good. Central Foveal Thickness: 208. Progression has been stable. Findings include no IRF, no SRF, abnormal foveal contour, retinal drusen , subretinal hyper-reflective material, intraretinal hyper-reflective material, pigment epithelial detachment, outer retinal atrophy (Stable improvement in focal SRHM/IRHM; shallow central PED --persistent).  Left Eye Quality was good. Central Foveal Thickness: 241. Progression has been stable. Findings include normal foveal contour, no IRF, no SRF, retinal drusen , epiretinal membrane, pigment epithelial detachment, outer retinal atrophy (Stable improvement in shallow SRF and low lying PED; patchy central ORA, partial PVD).   Notes *Images captured and stored on drive  Diagnosis / Impression:  OD: Stable improvement in focal SRHM/IRHM; shallow central  PED--persistent OS: Exudative ARMD -- stable improvement in shallow SRF and low lying PED; patchy central ORA, partial PVD  Clinical management:  See below  Abbreviations: NFP - Normal foveal profile. CME - cystoid macular edema. PED - pigment epithelial detachment. IRF - intraretinal fluid. SRF - subretinal fluid. EZ - ellipsoid zone. ERM - epiretinal membrane. ORA - outer retinal atrophy. ORT - outer retinal tubulation. SRHM - subretinal hyper-reflective material       Intravitreal Injection, Pharmacologic Agent - OD - Right Eye       Time Out 01/03/2024. 1:43 PM. Confirmed correct patient, procedure, site, and patient consented.   Anesthesia Topical anesthesia was used. Anesthetic medications included Lidocaine  2%, Proparacaine 0.5%.   Procedure Preparation included 5% betadine to ocular surface, eyelid speculum. A (32g) needle was used.   Injection: 1.25 mg Bevacizumab  1.25mg /0.32ml   Route: Intravitreal, Site: Right Eye   NDC: H525437, Lot: [id number], Expiration date: 03/17/2024   Post-op Post injection exam found visual acuity of at least counting fingers. The patient tolerated the procedure well. There were no complications. The patient received written and verbal post procedure care education.      Intravitreal Injection, Pharmacologic Agent - OS - Left Eye       Time Out 01/03/2024. 1:43 PM. Confirmed correct patient, procedure, site, and patient consented.   Anesthesia Topical anesthesia was used. Anesthetic medications included Lidocaine  2%, Proparacaine 0.5%, Akten 3.5%.   Procedure Preparation included 5% betadine to ocular surface, eyelid speculum. A supplied (32g) needle was used.   Injection: 1.25 mg Bevacizumab  1.25mg /0.7ml   Route: Intravitreal, Site: Left Eye   NDC: 49757-939-98, Lot: [id number]$$[id number]_GYnggnDjZmOJlxvwputTSIYjwOSTzzgQ$ , Expiration date: 01/26/2024   Post-op Post injection exam found visual acuity of at least counting fingers. The patient tolerated the procedure well.  There were no complications. The patient received written and verbal post procedure care education. Post injection medications were not given.            ASSESSMENT/PLAN:    ICD-10-CM   1. Exudative age-related macular degeneration of left eye with active choroidal neovascularization (HCC)  H35.3221 OCT, Retina - OU - Both Eyes    Intravitreal Injection, Pharmacologic Agent - OS - Left Eye    Bevacizumab  (AVASTIN ) SOLN 1.25 mg    2. Exudative age-related macular degeneration of right eye with active choroidal neovascularization (HCC)  H35.3211 Intravitreal Injection, Pharmacologic Agent - OD - Right Eye    Bevacizumab  (AVASTIN ) SOLN 1.25 mg    3. Diabetes mellitus type 2 without retinopathy (HCC)  E11.9     4. Long term (current) use of oral hypoglycemic drugs  Z79.84     5. Essential hypertension  I10     6. Hypertensive retinopathy of both eyes  H35.033     7. Pseudophakia of both eyes  Z96.1     8. Dry eyes  H04.123       1. Exudative age-related macular degeneration, left eye - conversion of OS from nonexudative to exudative ARMD prior to 02.05.20 visit - s/p IVA OS #1 (02.05.20), #2 (03.04.20), #3 (04.30.20), #4 (06.02.20), #5 (07.14.20), #  6 (09.1.20), #7 (10.27.20), #8 (12.23.20), #9 (02.03.21), #10 (03.17.21), #11 (04.28.21), #12 (06.09.21), #13 (08.04.21), #14 (09.29.21), #15 (11.22.2021), #16 (01.26.22), #17 (04.06.22), #18 (06.28.22), #19 (09.13.22), #20 (11.29.22), #21 (01.31.23), #22 (04.04.23), #23 (05.30.23), #24 (07.27.23), #25 (09.19.23), #26 (11.07.23), #27 (12.29.23), #28 (02.14.24), #29 (06.04.24), #30 (07.16.24), #31 (08.27.24), #32 (01.21.25), #33 (02.26.25), #34 (04.09.25), #35 (06.17.25), #36 (07.29.25), #37 (09.16.25), #38 (10.28.25) ================== - s/p IVE OS #1 (10.01.24), #2 (11.08.24), #3 (12.10.24) **history of recurrent / worsening SRF at 9 wk interval, noted on 04.04.23 visit, at 8 week interval on 09.19.23 and 06.04.24** - OCT shows OS: stable  improvement in shallow SRF and low lying PED; patchy central ORA, partial PVD at 6 weeks  - BCVA OS 20/40 from 20/50 - recommend IVA OS #39 today, 12.09.25 w/ f/u in 6 wks -- [employer] funding unavailable  - pt wishes to proceed with IVA injection - RBA of procedure discussed, questions answered  - see procedure notes - Avastin  informed consent obtained, signed and scanned 01.21.25 (OU)  - Eylea  approved for 2025 -- but no funding for [employer]  - f/u in 6 wks for DFE, OCT, possible injection OS   2. Exudative age-related macular degeneration, right eye  - conversion to exu ARMD on 06.04.24 exam - s/p IVA OD #1 (06.04.24), #2 (07.16.24), #3 (08.27.24), #4 (01.21.25), #5 (02.26.25), #6 (04.09.25), #7 (06.17.25), #8 (07.29.25), #9 (09.16.25), #10 (10.28.25)  ================  - s/p IVE OD #1 (10.01.24), #2 (11.08.24), #3 (12.10.24)  - BCVA OD 20/40 - stable - OCT shows OD: Stable improvement in focal SRHM/IRHM, shallow central PED--persistent at 6 weeks ** h/o of worsening at 7 weeks on 09.15.25** - recommend IVA OD #11 today, 12.09.25 with follow up 6 weeks  - pt wishes to proceed with injection  - RBA of procedure discussed, questions answered - IVA informed consent obtained and signed, 01.21.25 (OU) - see procedure note  - f/u in 6 wks, DFE, OCT, possible injxn OD  3,4. Diabetes mellitus, type 2 without retinopathy - The incidence, risk factors for progression, natural history and treatment options for diabetic retinopathy  were discussed with patient.   - The need for close monitoring of blood glucose, blood pressure, and serum lipids, avoiding cigarette or any type of tobacco, and the need for long term follow up was also discussed with patient.  - f/u in 1 year  5,6. Hypertensive retinopathy OU  - discussed importance of tight BP control  - continue to monitor  7. Pseudophakia OU  - s/p CE/IOL OU  - beautiful surgeries, doing well  - continue to monitor  8. Dry eyes OU  -  pt on restasis  BID OU - recommend artificial tears and lubricating ointment as needed  Ophthalmic Meds Ordered this visit:  Meds ordered this encounter  Medications   Bevacizumab  (AVASTIN ) SOLN 1.25 mg   Bevacizumab  (AVASTIN ) SOLN 1.25 mg    Return in about 6 weeks (around 02/14/2024) for exu ARMD OU, DFE, OCT, Possible Injxn.  There are no Patient Instructions on file for this visit.   This document serves as a record of services personally performed by Redell JUDITHANN Hans, MD, PhD. It was created on their behalf by Wanda GEANNIE Keens, COT an ophthalmic technician. The creation of this record is the provider's dictation and/or activities during the visit.    Electronically signed by:  Wanda GEANNIE Keens, COT  01/03/24 9:01 PM  This document serves as a record of services personally performed by Redell MATSU.  Valdemar, MD, PhD. It was created on their behalf by Almetta Pesa, an ophthalmic technician. The creation of this record is the provider's dictation and/or activities during the visit.    Electronically signed by: Almetta Pesa, OA, 01/03/24  9:01 PM   Redell JUDITHANN Valdemar, M.D., Ph.D. Diseases & Surgery of the Retina and Vitreous [facility]  I have reviewed the above documentation for accuracy and completeness, and I agree with the above. Redell JUDITHANN Valdemar, M.D., Ph.D. 01/03/24 9:07 PM   Abbreviations: M myopia (nearsighted); A astigmatism; H hyperopia (farsighted); P presbyopia; Mrx spectacle prescription;  CTL contact lenses; OD right eye; OS left eye; OU both eyes  XT exotropia; ET esotropia; PEK punctate epithelial keratitis; PEE punctate epithelial erosions; DES dry eye syndrome; MGD meibomian gland dysfunction; ATs artificial tears; PFAT's preservative free artificial tears; NSC nuclear sclerotic cataract; PSC posterior subcapsular cataract; ERM epi-retinal membrane; PVD posterior vitreous detachment; RD retinal detachment; DM diabetes mellitus; DR diabetic  retinopathy; NPDR non-proliferative diabetic retinopathy; PDR proliferative diabetic retinopathy; CSME clinically significant macular edema; DME diabetic macular edema; dbh dot blot hemorrhages; CWS cotton wool spot; POAG primary open angle glaucoma; C/D cup-to-disc ratio; HVF humphrey visual field; GVF goldmann visual field; OCT optical coherence tomography; IOP intraocular pressure; BRVO Branch retinal vein occlusion; CRVO central retinal vein occlusion; CRAO central retinal artery occlusion; BRAO branch retinal artery occlusion; RT retinal tear; SB scleral buckle; PPV pars plana vitrectomy; VH Vitreous hemorrhage; PRP panretinal laser photocoagulation; IVK intravitreal kenalog; VMT vitreomacular traction; MH Macular hole;  NVD neovascularization of the disc; NVE neovascularization elsewhere; AREDS age related eye disease study; ARMD age related macular degeneration; POAG primary open angle glaucoma; EBMD epithelial/anterior basement membrane dystrophy; ACIOL anterior chamber intraocular lens; IOL intraocular lens; PCIOL posterior chamber intraocular lens; Phaco/IOL phacoemulsification with intraocular lens placement; PRK photorefractive keratectomy; LASIK laser assisted in situ keratomileusis; HTN hypertension; DM diabetes mellitus; COPD chronic obstructive pulmonary disease

## 2024-01-03 ENCOUNTER — Encounter (INDEPENDENT_AMBULATORY_CARE_PROVIDER_SITE_OTHER): Payer: Self-pay | Admitting: Ophthalmology

## 2024-01-03 ENCOUNTER — Ambulatory Visit (INDEPENDENT_AMBULATORY_CARE_PROVIDER_SITE_OTHER): Admitting: Ophthalmology

## 2024-01-03 DIAGNOSIS — H04123 Dry eye syndrome of bilateral lacrimal glands: Secondary | ICD-10-CM

## 2024-01-03 DIAGNOSIS — Z7984 Long term (current) use of oral hypoglycemic drugs: Secondary | ICD-10-CM

## 2024-01-03 DIAGNOSIS — Z961 Presence of intraocular lens: Secondary | ICD-10-CM

## 2024-01-03 DIAGNOSIS — H353211 Exudative age-related macular degeneration, right eye, with active choroidal neovascularization: Secondary | ICD-10-CM

## 2024-01-03 DIAGNOSIS — H353221 Exudative age-related macular degeneration, left eye, with active choroidal neovascularization: Secondary | ICD-10-CM

## 2024-01-03 DIAGNOSIS — E119 Type 2 diabetes mellitus without complications: Secondary | ICD-10-CM

## 2024-01-03 DIAGNOSIS — I10 Essential (primary) hypertension: Secondary | ICD-10-CM

## 2024-01-03 DIAGNOSIS — H35033 Hypertensive retinopathy, bilateral: Secondary | ICD-10-CM

## 2024-01-03 MED ORDER — BEVACIZUMAB CHEMO INJECTION 1.25MG/0.05ML SYRINGE FOR KALEIDOSCOPE
1.2500 mg | INTRAVITREAL | Status: AC | PRN
  Administered 2024-01-03: 1.25 mg via INTRAVITREAL

## 2024-01-16 ENCOUNTER — Ambulatory Visit: Admitting: Podiatry

## 2024-01-31 NOTE — Progress Notes (Signed)
 " [facility] - Clinic Note  02/14/2024   CHIEF COMPLAINT Patient presents for Retina Follow Up  HISTORY OF PRESENT ILLNESS: Sue [name] is a 89 y.o. female who presents to the clinic today for:  HPI     Retina Follow Up   Patient presents with  Wet AMD.  In both eyes.  This started 6 years ago.  Duration of 6 weeks.  I, the attending physician,  performed the HPI with the patient and updated documentation appropriately.        Comments   Patient here for 6 weeks retina follow up for exu ARMD OU. Pt denies any changes in [place]. Pt is using Restasis  and AT's. A1C: 7.5- today BSL:140- this am       Last edited by Valdemar Rogue, MD on 02/14/2024  7:41 PM.     Patient feels the vision is the same.  Referring physician: Verdia Lombard, MD [address] [place],  [place] [address]  HISTORICAL INFORMATION:   Selected notes from the MEDICAL RECORD NUMBER Referred by Dr. Medford Ferrier for concern of ARMD OU   CURRENT MEDICATIONS: Current Outpatient Medications (Ophthalmic Drugs)  Medication Sig   Polyethyl Glycol-Propyl Glycol (SYSTANE) 0.4-0.3 % SOLN Place 1 drop into both eyes 3 (three) times daily as needed (dry eyes).   RESTASIS  0.05 % ophthalmic emulsion Place 1 drop into both eyes 2 (two) times daily.   No current facility-administered medications for this visit. (Ophthalmic Drugs)   Current Outpatient Medications (Other)  Medication Sig   amLODipine  (NORVASC ) 10 MG tablet Take 1 tablet by mouth daily.   B-D ULTRAFINE III SHORT PEN 31G X 8 MM MISC USE AS DIRECTED ONCE DAILY SUBCUTANEOUSLY   Calcium  Citrate-Vitamin D  (CALCIUM  + D PO)    calcium -vitamin D  (OSCAL WITH D) 500-200 MG-UNIT tablet Take 1 tablet by mouth 2 (two) times daily.    Continuous Blood Gluc Sensor (FREESTYLE LIBRE 2 SENSOR) MISC See admin instructions.   Cyanocobalamin  (B-12 PO)    diphenhydramine-acetaminophen  (TYLENOL  PM) 25-500 MG TABS tablet Take 1 tablet by  mouth at bedtime as needed (sleep).   gentamicin  cream (GARAMYCIN ) 0.1 % APPLY TO ULCER RIGHT FOOT ONCE DAILY.   Glucagon (GVOKE HYPOPEN 2-PACK) 0.5 MG/0.1ML SOAJ as needed for hypoglycemia   glucose blood test strip OneTouch Ultra Test strips  TEST ONCE A DAY ONCE A DAY FINGERSTICK 90   hydrALAZINE  (APRESOLINE ) 25 MG tablet Take 3 tablets (75 mg total) by mouth every 8 (eight) hours.   hydrochlorothiazide (HYDRODIURIL) 25 MG tablet Take 25 mg by mouth daily.   ibuprofen (ADVIL) 200 MG tablet Take 400 mg by mouth every 6 (six) hours as needed for moderate pain.   Insulin  Degludec (TRESIBA  FLEXTOUCH) 200 UNIT/ML SOPN Inject 36 Units into the skin at bedtime.   Insulin  Pen Needle (PEN NEEDLES) 32G X 4 MM MISC BD Ultra-Fine Nano Pen Needle 32 gauge x 5/32  USE AS DIRECTED DAILY   ketoconazole (NIZORAL) 2 % cream Apply topically.   Lancets (ONETOUCH ULTRASOFT) lancets OneTouch UltraSoft Lancets  USE TWICE DAILY   Melatonin 10 MG CAPS Take 10 mg by mouth at bedtime as needed (sleep).   meloxicam  (MOBIC ) 7.5 MG tablet Take 1 tablet (7.5 mg total) by mouth daily as needed for pain.   Multiple Vitamins-Minerals (ADULT ONE DAILY GUMMIES) CHEW Chew 1 tablet by mouth 2 (two) times daily. Centrum   Multiple Vitamins-Minerals (AIRBORNE) TBEF Take 1 tablet by mouth daily  as needed (immune support).   Multiple Vitamins-Minerals (CENTRUM SILVER  PO)    Multiple Vitamins-Minerals (PRESERVISION AREDS 2) CAPS Take 1 capsule by mouth 2 (two) times daily.   mupirocin  ointment (BACTROBAN ) 2 % Apply to right foot ulcer once daily.   nortriptyline  (PAMELOR ) 25 MG capsule Take 1 capsule (25 mg total) by mouth at bedtime.   Omega-3 Fatty Acids (FISH OIL) 1200 MG CAPS Take 1,200 mg by mouth daily with lunch.    omeprazole (PRILOSEC) 20 MG capsule Take 1 capsule by mouth daily.   ondansetron  (ZOFRAN ) 4 MG tablet Take 4 mg by mouth 3 (three) times daily as needed.   ONE TOUCH ULTRA TEST test strip    PRESCRIPTION  MEDICATION by Intravitreal route every 30 (thirty) days. Ophthalmic injection at MD office in left eye   silver  sulfADIAZINE  (SILVADENE ) 1 % cream Apply pea-sized amount to wound daily.   simvastatin  (ZOCOR ) 20 MG tablet Take 1 tablet by mouth daily.   traMADol  (ULTRAM ) 50 MG tablet Take 1 tablet (50 mg total) by mouth 2 (two) times daily as needed.   vitamin B-12 (CYANOCOBALAMIN ) 1000 MCG tablet Take 1,000 mcg by mouth 2 (two) times daily.    Current Facility-Administered Medications (Other)  Medication Route   Bevacizumab  (AVASTIN ) SOLN 1.25 mg Intravitreal   Bevacizumab  (AVASTIN ) SOLN 1.25 mg Intravitreal   REVIEW OF SYSTEMS:   ALLERGIES No Known Allergies  PAST MEDICAL HISTORY Past Medical History:  Diagnosis Date   Anemia    Arthritis    Basal cell carcinoma 11/23/2017   left elbow, right neck TX CX3 5FU    Basal cell carcinoma 04/04/2019   infil-right sideburn (CX35FU)   Chronic kidney disease    stage 3 ckd stage 3 b per dr verdia 09-26-2019 note on chart   Complication of anesthesia    deifficulty waking up   COVID-19    Diabetic neuropathy (HCC)    Hyperlipidemia    Hypertension    Hypertensive retinopathy    OU   Infiltrative basal cell carcinoma (BCC) 04/22/2020   Right Zygomatic Area    Macular degeneration    OU   Nodular basal cell carcinoma (BCC) 04/22/2020   Right Anterior Neck   Pneumonia    Right carotid bruit per dr verdia 09-26-2019 note on chart   SCC (squamous cell carcinoma) 11/23/2017   right forehead TX CX3 5FU   SCCA (squamous cell carcinoma) of skin 04/22/2020   Mid Parietal Scalp (in situ)   SCCA (squamous cell carcinoma) of skin 04/22/2020   Right Dorsal Hand (in situ)   Type 2 DM with CKD stage 3 and hypertension (HCC)    Past Surgical History:  Procedure Laterality Date   ABDOMINAL HYSTERECTOMY     AMPUTATION TOE Right 09/28/2019   Procedure: Right 2nd toe amputation - partial;  Surgeon: Gretel Ozell PARAS, DPM;  Location:  [facility] [place];  Service: Podiatry;  Laterality: Right;   CATARACT EXTRACTION     CHOLECYSTECTOMY     EYE SURGERY     HERNIA REPAIR     SPINE SURGERY     TONSILLECTOMY     FAMILY HISTORY History reviewed. No pertinent family history.  SOCIAL HISTORY Social History   Tobacco Use   Smoking status: Former   Smokeless tobacco: Never  [employer]vertising Account [employer]   Vaping status: Never Used  Substance Use Topics   Alcohol  use: No    Alcohol /week: 0.0 standard drinks of alcohol    Drug use: No  OPHTHALMIC EXAM:  Base Eye Exam     Visual Acuity (Snellen - Linear)       Right Left   Dist cc 20/60 -2 20/50   Dist ph cc 20/50 NI    Correction: Glasses         Tonometry (Tonopen, 1:28 PM)       Right Left   Pressure 16 14         Pupils       Pupils Dark Light Shape React APD   Right PERRL 3 2 Round Brisk None   Left PERRL 3 2 Round Brisk None         Visual Fields       Left Right    Full Full         Extraocular Movement       Right Left    Full, Ortho Full, Ortho         Neuro/Psych     Oriented x3: Yes   Mood/Affect: Normal         Dilation     Both eyes: 1.0% Mydriacyl, 2.5% Phenylephrine @ 1:28 PM           Slit Lamp and Fundus Exam     Slit Lamp Exam       Right Left   Lids/Lashes Dermatochalasis - upper lid, Telangiectasia, mild MGD Dermatochalasis - upper lid, Telangiectasia, Meibomian gland dysfunction   Conjunctiva/Sclera White and quiet White and quiet   Cornea Temporal Well healed cataract wounds, mild Arcus, trace endo pigment nasally, 1+PEE Arcus, Temporal Well healed cataract wounds, 1-2+ PEE, tear film debris   Anterior Chamber Deep and quiet Deep and quiet   Iris Round and moderately dilated to 4.32mm Round and moderately dilated to 5mm, Iris atrophy from 0100 to 0230, peripheral iridotomy at 0200   Lens 3-piece PC IOL in good position PC IOL in good position   Anterior Vitreous Vitreous syneresis, Posterior  vitreous detachment, Weiss ring, vitreous condensations Vitreous syneresis, PVD, vitreous condensations         Fundus Exam       Right Left   Disc 360 Peripapillary atrophy, diffuse mild pallor, compact, sharp rim mild pallor, Peripapillary atrophy and pigmentation   C/D Ratio 0.1 0.2   Macula Blunted foveal reflex, +PED / CNV, RPE mottling, clumping and early atrophy, Drusen, No heme, no fluid Blunted foveal reflex, low lying PED / +CNVM with stable improvement in IRF and SRF, RPE mottling and clumping, Drusen, No heme, early atrophy   Vessels Attenuated, Tortuous Attenuated, mild tortuosity   Periphery Attached, 360 Reticular degeneration, No heme Attached, 360 Reticular degeneration, No heme           Refraction     Wearing Rx       Sphere Cylinder Axis Add   Right -2.00 +1.25 161 +2.50   Left +0.25 +0.50 004 +2.50           IMAGING AND PROCEDURES  Imaging and Procedures for @TODAY @  OCT, Retina - OU - Both Eyes       Right Eye Quality was good. Central Foveal Thickness: 231. Progression has been stable. Findings include no IRF, no SRF, abnormal foveal contour, retinal drusen , subretinal hyper-reflective material, intraretinal hyper-reflective material, pigment epithelial detachment, outer retinal atrophy (Stable improvement in focal SRHM/IRHM; shallow central PED --persistent).   Left Eye Quality was good. Central Foveal Thickness: 241. Progression has been stable. Findings include normal foveal contour, no IRF, no SRF,  retinal drusen , epiretinal membrane, pigment epithelial detachment, outer retinal atrophy (Stable improvement in shallow SRF and low lying PED; patchy central ORA, partial PVD).   Notes *Images captured and stored on drive  Diagnosis / Impression:  OD: Stable improvement in focal SRHM/IRHM; shallow central PED--persistent OS: Exudative ARMD -- stable improvement in shallow SRF and low lying PED; patchy central ORA, partial PVD  Clinical  management:  See below  Abbreviations: NFP - Normal foveal profile. CME - cystoid macular edema. PED - pigment epithelial detachment. IRF - intraretinal fluid. SRF - subretinal fluid. EZ - ellipsoid zone. ERM - epiretinal membrane. ORA - outer retinal atrophy. ORT - outer retinal tubulation. SRHM - subretinal hyper-reflective material       Intravitreal Injection, Pharmacologic Agent - OD - Right Eye       Time Out 02/14/2024. 2:12 PM. Confirmed correct patient, procedure, site, and patient consented.   Anesthesia Topical anesthesia was used. Anesthetic medications included Lidocaine  2%, Proparacaine 0.5%.   Procedure Preparation included 5% betadine to ocular surface, eyelid speculum. A (32g) needle was used.   Injection: 1.25 mg Bevacizumab  1.25mg /0.27ml   Route: Intravitreal, Site: Right Eye   NDC: C2662926, Lot: [id number], Expiration date: 12/13/2024   Post-op Post injection exam found visual acuity of at least counting fingers. The patient tolerated the procedure well. There were no complications. The patient received written and verbal post procedure care education.      Intravitreal Injection, Pharmacologic Agent - OS - Left Eye       Time Out 02/14/2024. 2:12 PM. Confirmed correct patient, procedure, site, and patient consented.   Anesthesia Topical anesthesia was used. Anesthetic medications included Lidocaine  2%, Proparacaine 0.5%, Akten 3.5%.   Procedure Preparation included 5% betadine to ocular surface, eyelid speculum. A supplied (32g) needle was used.   Injection: 1.25 mg Bevacizumab  1.25mg /0.51ml   Route: Intravitreal, Site: Left Eye   NDC: C2662926, Lot: [id number], Expiration date: 12/13/2024   Post-op Post injection exam found visual acuity of at least counting fingers. The patient tolerated the procedure well. There were no complications. The patient received written and verbal post procedure care education. Post injection medications were not  given.            ASSESSMENT/PLAN:    ICD-10-CM   1. Exudative age-related macular degeneration of left eye with active choroidal neovascularization (HCC)  H35.3221 OCT, Retina - OU - Both Eyes    Intravitreal Injection, Pharmacologic Agent - OS - Left Eye    Bevacizumab  (AVASTIN ) SOLN 1.25 mg    2. Exudative age-related macular degeneration of right eye with active choroidal neovascularization (HCC)  H35.3211 Intravitreal Injection, Pharmacologic Agent - OD - Right Eye    Bevacizumab  (AVASTIN ) SOLN 1.25 mg    3. Diabetes mellitus type 2 without retinopathy (HCC)  E11.9     4. Long term (current) use of oral hypoglycemic drugs  Z79.84     5. Essential hypertension  I10     6. Hypertensive retinopathy of both eyes  H35.033     7. Pseudophakia of both eyes  Z96.1     8. Dry eyes  H04.123      1. Exudative age-related macular degeneration, left eye - conversion of OS from nonexudative to exudative ARMD prior to 02.05.20 visit - s/p IVA OS #1 (02.05.20), #2 (03.04.20), #3 (04.30.20), #4 (06.02.20), #5 (07.14.20), #6 (09.1.20), #7 (10.27.20), #8 (12.23.20), #9 (02.03.21), #10 (03.17.21), #11 (04.28.21), #12 (06.09.21), #13 (08.04.21), #14 (09.29.21), #15 (11.22.21), #16 (01.26.22), #17 (  04.06.22), #18 (06.28.22), #19 (09.13.22), #20 (11.29.22), #21 (01.31.23), #22 (04.04.23), #23 (05.30.23), #24 (07.27.23), #25 (09.19.23), #26 (11.07.23), #27 (12.29.23), #28 (02.14.24), #29 (06.04.24), #30 (07.16.24), #31 (08.27.24), #32 (01.21.25), #33 (02.26.25), #34 (04.09.25), #35 (06.17.25), #36 (07.29.25), #37 (09.16.25), #38 (10.28.25), #39 (12.09.25) ================== - s/p IVE OS #1 (10.01.24), #2 (11.08.24), #3 (12.10.24) **history of recurrent / worsening SRF at 9 wk interval, noted on 04.04.23 visit, at 8 week interval on 09.19.23 and 06.04.24** - OCT shows OS: stable improvement in shallow SRF and low lying PED; patchy central ORA, partial PVD at 6 weeks  - BCVA OS 20/50 from 20/40  -  recommend IVA OS #40 today, 01.20.26 w/ f/u in 6 wks -- [employer] funding unavailable  - pt wishes to proceed with IVA injection - RBA of procedure discussed, questions answered  - see procedure notes - Avastin  informed consent obtained, signed and scanned 01.20.26 (OU)  - Eylea  approved for 2025 -- but no funding for [employer]  - f/u in 6 wks for DFE, OCT, possible injection OS   2. Exudative age-related macular degeneration, right eye  - conversion to exu ARMD on 06.04.24 exam - s/p IVA OD #1 (06.04.24), #2 (07.16.24), #3 (08.27.24), #4 (01.21.25), #5 (02.26.25), #6 (04.09.25), #7 (06.17.25), #8 (07.29.25), #9 (09.16.25), #10 (10.28.25), #11 (12.09.25)  ================  - s/p IVE OD #1 (10.01.24), #2 (11.08.24), #3 (12.10.24)  - BCVA OD 20/50 from 20/40  - OCT shows OD: Stable improvement in focal SRHM/IRHM, shallow central PED--persistent at 6 weeks ** h/o of worsening at 7 weeks on 09.15.25** - recommend IVA OD #12 today, 01.20.26 with follow up 6 weeks  - pt wishes to proceed with injection  - RBA of procedure discussed, questions answered - IVA informed consent obtained and signed, 01.20.26 (OU) - see procedure note  - f/u in 6 wks, DFE, OCT, possible injxn OD  3,4. Diabetes mellitus, type 2 without retinopathy - The incidence, risk factors for progression, natural history and treatment options for diabetic retinopathy  were discussed with patient.   - The need for close monitoring of blood glucose, blood pressure, and serum lipids, avoiding cigarette or any type of tobacco, and the need for long term follow up was also discussed with patient.  - f/u in 1 year  5,6. Hypertensive retinopathy OU  - discussed importance of tight BP control  - continue to monitor  7. Pseudophakia OU  - s/p CE/IOL OU  - beautiful surgeries, doing well  - continue to monitor  8. Dry eyes OU  - pt on restasis  BID OU - recommend artificial tears and lubricating ointment as needed  Ophthalmic  Meds Ordered this visit:  Meds ordered this encounter  Medications   Bevacizumab  (AVASTIN ) SOLN 1.25 mg   Bevacizumab  (AVASTIN ) SOLN 1.25 mg    Return in about 6 weeks (around 03/27/2024) for f/u, Ex. AMD, DFE, OCT, Possible, IVA, OU.  There are no Patient Instructions on file for this visit.   This document serves as a record of services personally performed by Redell JUDITHANN Hans, MD, PhD. It was created on their behalf by Wanda GEANNIE Keens, COT an ophthalmic technician. The creation of this record is the provider's dictation and/or activities during the visit.    Electronically signed by:  Wanda GEANNIE Keens, COT  02/14/24 8:25 PM  This document serves as a record of services personally performed by Redell JUDITHANN Hans, MD, PhD. It was created on their behalf by Almetta Pesa, an ophthalmic technician. The  creation of this record is the provider's dictation and/or activities during the visit.    Electronically signed by: Almetta Pesa, OA, 02/14/24  8:25 PM  Redell JUDITHANN Hans, M.D., Ph.D. Diseases & Surgery of the Retina and Vitreous [facility]  I have reviewed the above documentation for accuracy and completeness, and I agree with the above. Redell JUDITHANN Hans, M.D., Ph.D. 02/14/24 8:30 PM   Abbreviations: M myopia (nearsighted); A astigmatism; H hyperopia (farsighted); P presbyopia; Mrx spectacle prescription;  CTL contact lenses; OD right eye; OS left eye; OU both eyes  XT exotropia; ET esotropia; PEK punctate epithelial keratitis; PEE punctate epithelial erosions; DES dry eye syndrome; MGD meibomian gland dysfunction; ATs artificial tears; PFAT's preservative free artificial tears; NSC nuclear sclerotic cataract; PSC posterior subcapsular cataract; ERM epi-retinal membrane; PVD posterior vitreous detachment; RD retinal detachment; DM diabetes mellitus; DR diabetic retinopathy; NPDR non-proliferative diabetic retinopathy; PDR proliferative diabetic retinopathy; CSME  clinically significant macular edema; DME diabetic macular edema; dbh dot blot hemorrhages; CWS cotton wool spot; POAG primary open angle glaucoma; C/D cup-to-disc ratio; HVF humphrey visual field; GVF goldmann visual field; OCT optical coherence tomography; IOP intraocular pressure; BRVO Branch retinal vein occlusion; CRVO central retinal vein occlusion; CRAO central retinal artery occlusion; BRAO branch retinal artery occlusion; RT retinal tear; SB scleral buckle; PPV pars plana vitrectomy; VH Vitreous hemorrhage; PRP panretinal laser photocoagulation; IVK intravitreal kenalog; VMT vitreomacular traction; MH Macular hole;  NVD neovascularization of the disc; NVE neovascularization elsewhere; AREDS age related eye disease study; ARMD age related macular degeneration; POAG primary open angle glaucoma; EBMD epithelial/anterior basement membrane dystrophy; ACIOL anterior chamber intraocular lens; IOL intraocular lens; PCIOL posterior chamber intraocular lens; Phaco/IOL phacoemulsification with intraocular lens placement; PRK photorefractive keratectomy; LASIK laser assisted in situ keratomileusis; HTN hypertension; DM diabetes mellitus; COPD chronic obstructive pulmonary disease "

## 2024-02-03 ENCOUNTER — Ambulatory Visit: Admitting: Podiatry

## 2024-02-03 DIAGNOSIS — M79675 Pain in left toe(s): Secondary | ICD-10-CM

## 2024-02-03 DIAGNOSIS — L84 Corns and callosities: Secondary | ICD-10-CM

## 2024-02-03 DIAGNOSIS — B351 Tinea unguium: Secondary | ICD-10-CM

## 2024-02-03 DIAGNOSIS — E1142 Type 2 diabetes mellitus with diabetic polyneuropathy: Secondary | ICD-10-CM | POA: Diagnosis not present

## 2024-02-03 DIAGNOSIS — M79674 Pain in right toe(s): Secondary | ICD-10-CM

## 2024-02-03 DIAGNOSIS — Z89421 Acquired absence of other right toe(s): Secondary | ICD-10-CM | POA: Diagnosis not present

## 2024-02-10 ENCOUNTER — Encounter: Payer: Self-pay | Admitting: Podiatry

## 2024-02-10 NOTE — Progress Notes (Signed)
"  °  Subjective:  Patient ID: Sue [name], female    DOB: 02-21-1932,  MRN: [id number]  Sue [name] presents to clinic today for at risk footcare. Patient has h/o diabetes, neuropathy and PAD with amputation(s) of R 2nd toe and callus(es) right lower extremity and painful mycotic toenails that are difficult to trim. Painful toenails interfere with ambulation. Aggravating factors include wearing enclosed shoe gear. Pain is relieved with periodic professional debridement. Painful calluses are aggravated when weightbearing with and without shoegear. Pain is relieved with periodic professional debridement.  She is accompanied by her daughter on today's visit. Chief Complaint  Patient presents with   Diabetes    A1c 8.8  and she sees Dr. Verdia in 2 weeks   New problem(s): None.   PCP is Verdia Lombard, MD. Sue [name] 01/31/24.  Allergies[1]  Review of Systems: Negative except as noted in the HPI.  Objective: No changes noted in today's physical examination. There were no vitals filed for this visit. Sue [name] is a pleasant 89 y.o. female in NAD. AAO x 3.  Vascular Examination: Capillary refill time immediate b/l. Vascular status intact b/l with palpable pedal pulses. Pedal hair present b/l. No pain with calf compression b/l. Skin temperature gradient WNL b/l. No cyanosis or clubbing b/l. No ischemia or gangrene noted b/l.   Neurological Examination: Pt has subjective symptoms of neuropathy. Protective sensation diminished with 10g monofilament b/l.  Dermatological Examination: Pedal skin with normal turgor, texture and tone b/l. No interdigital macerations.   Toenails 1-5 left, 1, 3-5 right elongated, discolored, dystrophic, thickened, and crumbly with subungual debris and tenderness to dorsal palpation.  Hyperkeratotic lesion noted submet head 2 right foot. There is no surrounding erythema, no edema, no drainage, no odor, no fluctuance.  Musculoskeletal  Examination: Muscle strength 5/5 to all lower extremity muscle groups bilaterally. 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 Utilizes rollator for ambulation assistance.  Radiographs: None  Assessment/Plan: 1. Pain due to onychomycosis of toenails of both feet   2. Callus   3. History of partial amputation of toe of right foot   4. Diabetic peripheral neuropathy associated with type 2 diabetes mellitus [facility])     Consent given for treatment. She continues to do well with 9 week interval. Patient examined. All patient's and/or POA's questions/concerns addressed on today's visit. Mycotic toenails 1-5 b/l  debrided in length and girth without incident. Callus(es) submet head 2 right foot pared with sharp debridement without incident. Continue foot and shoe inspections daily. Monitor blood glucose per PCP/Endocrinologist's recommendations.Continue soft, supportive shoe gear daily. Report any pedal injuries to medical professional. Call office if there are any quesitons/concerns.  Return in about 9 weeks (around 04/06/2024).  Sue [name], DPM      [facility] LOCATION: [address] [place] [address]                   Office [phone]   [facility] LOCATION: [address] [place], [place] [address] Office [phone]     [1] No Known Allergies  "

## 2024-02-14 ENCOUNTER — Encounter (INDEPENDENT_AMBULATORY_CARE_PROVIDER_SITE_OTHER): Payer: Self-pay | Admitting: Ophthalmology

## 2024-02-14 ENCOUNTER — Ambulatory Visit (INDEPENDENT_AMBULATORY_CARE_PROVIDER_SITE_OTHER): Admitting: Ophthalmology

## 2024-02-14 DIAGNOSIS — Z961 Presence of intraocular lens: Secondary | ICD-10-CM

## 2024-02-14 DIAGNOSIS — Z7984 Long term (current) use of oral hypoglycemic drugs: Secondary | ICD-10-CM

## 2024-02-14 DIAGNOSIS — H353231 Exudative age-related macular degeneration, bilateral, with active choroidal neovascularization: Secondary | ICD-10-CM | POA: Diagnosis not present

## 2024-02-14 DIAGNOSIS — E119 Type 2 diabetes mellitus without complications: Secondary | ICD-10-CM | POA: Diagnosis not present

## 2024-02-14 DIAGNOSIS — H04123 Dry eye syndrome of bilateral lacrimal glands: Secondary | ICD-10-CM

## 2024-02-14 DIAGNOSIS — I10 Essential (primary) hypertension: Secondary | ICD-10-CM | POA: Diagnosis not present

## 2024-02-14 DIAGNOSIS — H35033 Hypertensive retinopathy, bilateral: Secondary | ICD-10-CM | POA: Diagnosis not present

## 2024-02-14 DIAGNOSIS — H353221 Exudative age-related macular degeneration, left eye, with active choroidal neovascularization: Secondary | ICD-10-CM

## 2024-02-14 DIAGNOSIS — H353211 Exudative age-related macular degeneration, right eye, with active choroidal neovascularization: Secondary | ICD-10-CM

## 2024-02-14 MED ORDER — BEVACIZUMAB CHEMO INJECTION 1.25MG/0.05ML SYRINGE FOR KALEIDOSCOPE
1.2500 mg | INTRAVITREAL | Status: AC | PRN
  Administered 2024-02-14: 1.25 mg via INTRAVITREAL

## 2024-03-26 ENCOUNTER — Encounter (INDEPENDENT_AMBULATORY_CARE_PROVIDER_SITE_OTHER): Admitting: Ophthalmology

## 2024-05-15 ENCOUNTER — Ambulatory Visit: Admitting: Podiatry
